# Patient Record
Sex: Male | Born: 1950 | Race: Black or African American | Hispanic: No | Smoking: Former smoker
Health system: Southern US, Community
[De-identification: ages and names within clinical notes are randomized; demographics above are authoritative.]

## PROBLEM LIST (undated history)

## (undated) DIAGNOSIS — I519 Heart disease, unspecified: Secondary | ICD-10-CM

## (undated) DIAGNOSIS — N189 Chronic kidney disease, unspecified: Secondary | ICD-10-CM

## (undated) DIAGNOSIS — Z6372 Alcoholism and drug addiction in family: Secondary | ICD-10-CM

## (undated) DIAGNOSIS — I2510 Atherosclerotic heart disease of native coronary artery without angina pectoris: Secondary | ICD-10-CM

## (undated) DIAGNOSIS — I428 Other cardiomyopathies: Secondary | ICD-10-CM

## (undated) DIAGNOSIS — R569 Unspecified convulsions: Secondary | ICD-10-CM

## (undated) DIAGNOSIS — J449 Chronic obstructive pulmonary disease, unspecified: Secondary | ICD-10-CM

## (undated) DIAGNOSIS — I5022 Chronic systolic (congestive) heart failure: Secondary | ICD-10-CM

## (undated) DIAGNOSIS — N183 Chronic kidney disease, stage 3 unspecified: Secondary | ICD-10-CM

## (undated) DIAGNOSIS — M1990 Unspecified osteoarthritis, unspecified site: Secondary | ICD-10-CM

## (undated) DIAGNOSIS — R7611 Nonspecific reaction to tuberculin skin test without active tuberculosis: Secondary | ICD-10-CM

## (undated) DIAGNOSIS — I499 Cardiac arrhythmia, unspecified: Secondary | ICD-10-CM

## (undated) DIAGNOSIS — I509 Heart failure, unspecified: Secondary | ICD-10-CM

## (undated) DIAGNOSIS — I10 Essential (primary) hypertension: Secondary | ICD-10-CM

## (undated) HISTORY — PX: CARDIAC CATHETERIZATION: SHX172

## (undated) HISTORY — DX: Other cardiomyopathies: I42.8

## (undated) HISTORY — DX: Chronic kidney disease, unspecified: N18.9

## (undated) HISTORY — DX: Heart disease, unspecified: I51.9

## (undated) HISTORY — DX: Unspecified convulsions: R56.9

## (undated) HISTORY — PX: TONSILLECTOMY: SUR1361

## (undated) HISTORY — DX: Alcoholism and drug addiction in family: Z63.72

## (undated) HISTORY — DX: Chronic obstructive pulmonary disease, unspecified: J44.9

## (undated) HISTORY — DX: Unspecified osteoarthritis, unspecified site: M19.90

## (undated) HISTORY — DX: Chronic systolic (congestive) heart failure: I50.22

## (undated) HISTORY — DX: Nonspecific reaction to tuberculin skin test without active tuberculosis: R76.11

## (undated) HISTORY — DX: Chronic kidney disease, stage 3 unspecified: N18.30

---

## 2006-07-21 ENCOUNTER — Ambulatory Visit: Payer: Self-pay | Admitting: Gastroenterology

## 2008-06-20 ENCOUNTER — Emergency Department: Payer: Self-pay | Admitting: Emergency Medicine

## 2008-07-24 ENCOUNTER — Emergency Department: Payer: Self-pay | Admitting: Emergency Medicine

## 2008-09-05 ENCOUNTER — Ambulatory Visit: Payer: Self-pay | Admitting: Internal Medicine

## 2017-01-02 ENCOUNTER — Other Ambulatory Visit: Payer: Self-pay

## 2017-01-02 ENCOUNTER — Emergency Department: Payer: Medicaid - Out of State

## 2017-01-02 ENCOUNTER — Inpatient Hospital Stay
Admission: EM | Admit: 2017-01-02 | Discharge: 2017-01-05 | DRG: 291 | Disposition: A | Discharge status: Home Health | Payer: Medicaid - Out of State | Attending: Internal Medicine | Admitting: Internal Medicine

## 2017-01-02 DIAGNOSIS — N183 Chronic kidney disease, stage 3 (moderate): Secondary | ICD-10-CM | POA: Diagnosis present

## 2017-01-02 DIAGNOSIS — I509 Heart failure, unspecified: Secondary | ICD-10-CM

## 2017-01-02 DIAGNOSIS — I130 Hypertensive heart and chronic kidney disease with heart failure and stage 1 through stage 4 chronic kidney disease, or unspecified chronic kidney disease: Secondary | ICD-10-CM | POA: Diagnosis present

## 2017-01-02 DIAGNOSIS — Z9119 Patient's noncompliance with other medical treatment and regimen: Secondary | ICD-10-CM | POA: Diagnosis not present

## 2017-01-02 DIAGNOSIS — I5021 Acute systolic (congestive) heart failure: Secondary | ICD-10-CM | POA: Diagnosis present

## 2017-01-02 DIAGNOSIS — R9431 Abnormal electrocardiogram [ECG] [EKG]: Secondary | ICD-10-CM

## 2017-01-02 DIAGNOSIS — I248 Other forms of acute ischemic heart disease: Secondary | ICD-10-CM | POA: Diagnosis present

## 2017-01-02 DIAGNOSIS — I2510 Atherosclerotic heart disease of native coronary artery without angina pectoris: Secondary | ICD-10-CM | POA: Diagnosis present

## 2017-01-02 DIAGNOSIS — Z880 Allergy status to penicillin: Secondary | ICD-10-CM

## 2017-01-02 DIAGNOSIS — N179 Acute kidney failure, unspecified: Secondary | ICD-10-CM | POA: Diagnosis present

## 2017-01-02 DIAGNOSIS — Z8679 Personal history of other diseases of the circulatory system: Secondary | ICD-10-CM

## 2017-01-02 DIAGNOSIS — Z9114 Patient's other noncompliance with medication regimen: Secondary | ICD-10-CM

## 2017-01-02 DIAGNOSIS — F17210 Nicotine dependence, cigarettes, uncomplicated: Secondary | ICD-10-CM | POA: Diagnosis present

## 2017-01-02 HISTORY — DX: Essential (primary) hypertension: I10

## 2017-01-02 HISTORY — DX: Cardiac arrhythmia, unspecified: I49.9

## 2017-01-02 HISTORY — DX: Atherosclerotic heart disease of native coronary artery without angina pectoris: I25.10

## 2017-01-02 LAB — CBC
HEMATOCRIT: 45.3 % (ref 40.0–52.0)
HEMOGLOBIN: 15.1 g/dL (ref 13.0–18.0)
MCH: 30.1 pg (ref 26.0–34.0)
MCHC: 33.3 g/dL (ref 32.0–36.0)
MCV: 90.5 fL (ref 80.0–100.0)
Platelets: 135 10*3/uL — ABNORMAL LOW (ref 150–440)
RBC: 5 MIL/uL (ref 4.40–5.90)
RDW: 14.1 % (ref 11.5–14.5)
WBC: 5.4 10*3/uL (ref 3.8–10.6)

## 2017-01-02 LAB — BASIC METABOLIC PANEL
ANION GAP: 8 (ref 5–15)
BUN: 21 mg/dL — ABNORMAL HIGH (ref 6–20)
CALCIUM: 9.1 mg/dL (ref 8.9–10.3)
CHLORIDE: 103 mmol/L (ref 101–111)
CO2: 25 mmol/L (ref 22–32)
Creatinine, Ser: 1.61 mg/dL — ABNORMAL HIGH (ref 0.61–1.24)
GFR calc non Af Amer: 44 mL/min — ABNORMAL LOW (ref >60)
GFR, EST AFRICAN AMERICAN: 51 mL/min — AB (ref >60)
GLUCOSE: 104 mg/dL — AB (ref 65–99)
POTASSIUM: 4.4 mmol/L (ref 3.5–5.1)
Sodium: 136 mmol/L (ref 135–145)

## 2017-01-02 LAB — URINE DRUG SCREEN, QUALITATIVE (ARMC ONLY)
Amphetamines, Ur Screen: NOT DETECTED
Barbiturates, Ur Screen: NOT DETECTED
Benzodiazepine, Ur Scrn: NOT DETECTED
CANNABINOID 50 NG, UR ~~LOC~~: NOT DETECTED
COCAINE METABOLITE, UR ~~LOC~~: POSITIVE — AB
MDMA (ECSTASY) UR SCREEN: NOT DETECTED
Methadone Scn, Ur: NOT DETECTED
Opiate, Ur Screen: NOT DETECTED
PHENCYCLIDINE (PCP) UR S: NOT DETECTED
Tricyclic, Ur Screen: NOT DETECTED

## 2017-01-02 LAB — CREATININE, SERUM
Creatinine, Ser: 1.75 mg/dL — ABNORMAL HIGH (ref 0.61–1.24)
GFR calc Af Amer: 46 mL/min — ABNORMAL LOW (ref >60)
GFR, EST NON AFRICAN AMERICAN: 39 mL/min — AB (ref >60)

## 2017-01-02 LAB — BRAIN NATRIURETIC PEPTIDE: B Natriuretic Peptide: 1857 pg/mL — ABNORMAL HIGH (ref 0.0–100.0)

## 2017-01-02 LAB — TROPONIN I
TROPONIN I: 0.03 ng/mL — AB (ref <0.03)
Troponin I: 0.03 ng/mL (ref <0.03)

## 2017-01-02 MED ORDER — FUROSEMIDE 10 MG/ML IJ SOLN
40.0000 mg | Freq: Once | INTRAMUSCULAR | Status: AC
  Administered 2017-01-02: 40 mg via INTRAVENOUS
  Filled 2017-01-02: qty 4

## 2017-01-02 MED ORDER — ASPIRIN 81 MG PO CHEW
324.0000 mg | CHEWABLE_TABLET | Freq: Once | ORAL | Status: AC
  Administered 2017-01-02: 324 mg via ORAL
  Filled 2017-01-02: qty 4

## 2017-01-02 MED ORDER — ACETAMINOPHEN 325 MG PO TABS: 650.0000 mg | ORAL_TABLET | Freq: Four times a day (QID) | ORAL | Status: DC | PRN

## 2017-01-02 MED ORDER — SODIUM CHLORIDE 0.9 % IV SOLN: 250.0000 mL | INTRAVENOUS | Status: DC | PRN

## 2017-01-02 MED ORDER — LABETALOL HCL 5 MG/ML IV SOLN
10.0000 mg | INTRAVENOUS | Status: DC | PRN
  Administered 2017-01-02 – 2017-01-03 (×2): 10 mg via INTRAVENOUS
  Filled 2017-01-02 (×2): qty 4

## 2017-01-02 MED ORDER — ACETAMINOPHEN 650 MG RE SUPP
650.0000 mg | Freq: Four times a day (QID) | RECTAL | Status: DC | PRN
Start: 2017-01-02 — End: 2017-01-05

## 2017-01-02 MED ORDER — ENOXAPARIN SODIUM 40 MG/0.4ML ~~LOC~~ SOLN
40.0000 mg | SUBCUTANEOUS | Status: DC
  Administered 2017-01-02: 40 mg via SUBCUTANEOUS
  Filled 2017-01-02: qty 0.4

## 2017-01-02 MED ORDER — HYDRALAZINE HCL 20 MG/ML IJ SOLN
10.0000 mg | Freq: Four times a day (QID) | INTRAMUSCULAR | Status: DC | PRN
  Administered 2017-01-03: 10 mg via INTRAVENOUS
  Filled 2017-01-02: qty 1

## 2017-01-02 MED ORDER — NITROGLYCERIN 2 % TD OINT
0.5000 [in_us] | TOPICAL_OINTMENT | Freq: Four times a day (QID) | TRANSDERMAL | Status: DC
  Administered 2017-01-02 – 2017-01-04 (×6): 0.5 [in_us] via TOPICAL
  Filled 2017-01-02 (×7): qty 1

## 2017-01-02 MED ORDER — ASPIRIN 325 MG PO TABS
325.0000 mg | ORAL_TABLET | Freq: Every day | ORAL | Status: DC
  Administered 2017-01-03 – 2017-01-05 (×3): 325 mg via ORAL
  Filled 2017-01-02 (×4): qty 1

## 2017-01-02 MED ORDER — IOPAMIDOL (ISOVUE-370) INJECTION 76%
75.0000 mL | Freq: Once | INTRAVENOUS | Status: AC | PRN
  Administered 2017-01-02: 75 mL via INTRAVENOUS

## 2017-01-02 MED ORDER — FUROSEMIDE 10 MG/ML IJ SOLN
20.0000 mg | Freq: Two times a day (BID) | INTRAMUSCULAR | Status: DC
  Administered 2017-01-03 – 2017-01-04 (×3): 20 mg via INTRAVENOUS
  Filled 2017-01-02: qty 4
  Filled 2017-01-02 (×3): qty 2

## 2017-01-02 NOTE — ED Triage Notes (Signed)
Pt c/o SOB and CP that started together approx 3 days ago. Pt states that he feels squeezing and heaviness in central chest area. White productive sputum. Unable to lie down to sleep. No hx of CHF.

## 2017-01-02 NOTE — H&P (Signed)
[facility] [place] NAME: Jonathon Rios    MR#:  [id number]  DATE OF BIRTH:  1950/08/12  DATE OF ADMISSION:  01/02/2017  PRIMARY CARE PHYSICIAN: Patient, No Pcp Per   REQUESTING/REFERRING PHYSICIAN: Rudene Re MD  CHIEF COMPLAINT:   Chief Complaint  Patient presents with  . Chest Pain  . Shortness of Breath    HISTORY OF PRESENT ILLNESS: Jonathon Rios  is a 66 y.o. male with a known history of a artery disease, essential hypertension and congestive heart failure who is not on any medications.  He reports that he was evaluated last in [place] at that time he told was told that his heart was weak but is not taking any medications currently.  Who is presenting with chest pain and shortness of breath.  Patient's BNP is significantly elevated his blood pressure is high as well.  He states that he feels like pressure on his chest.  He does not recall the results of his cardiac catheterization that he had one year ago.  Patient states that he stopped taking all medications in January.  Denies any significant lower extremity swelling.        PAST MEDICAL HISTORY:   Past Medical History:  Diagnosis Date  . Coronary artery disease   . Hypertension   . Irregular heart beat     PAST SURGICAL HISTORY: History reviewed. No pertinent surgical history.  SOCIAL HISTORY:  Social History   Tobacco Use  . Smoking status: Not on file  Substance Use Topics  . Alcohol use: No    Frequency: Never    FAMILY HISTORY:  Positive for hypertension    DRUG ALLERGIES:  Allergies  Allergen Reactions  . Penicillins Hives    Has patient had a PCN reaction causing immediate rash, facial/tongue/throat swelling, SOB or lightheadedness with hypotension: Yes Has patient had a PCN reaction causing severe rash involving mucus membranes or skin necrosis: No Has patient had a PCN reaction that required hospitalization: No Has patient had a PCN reaction  occurring within the last 10 years: No If all of the above answers are "NO", then may proceed with Cephalosporin use.    REVIEW OF SYSTEMS:   CONSTITUTIONAL: No fever, positive fatigue or weakness.  EYES: No blurred or double vision.  EARS, NOSE, AND THROAT: No tinnitus or ear pain.  RESPIRATORY: No cough, shortness of breath, wheezing or hemoptysis.  CARDIOVASCULAR: No chest pain, orthopnea, edema.  GASTROINTESTINAL: No nausea, vomiting, diarrhea or abdominal pain.  GENITOURINARY: No dysuria, hematuria.  ENDOCRINE: No polyuria, nocturia,  HEMATOLOGY: No anemia, easy bruising or bleeding SKIN: No rash or lesion. MUSCULOSKELETAL: No joint pain or arthritis.   NEUROLOGIC: No tingling, numbness, weakness.  PSYCHIATRY: No anxiety or depression.   MEDICATIONS AT HOME:  Prior to Admission medications   Not on File      PHYSICAL EXAMINATION:   VITAL SIGNS: Blood pressure (!) 158/109, pulse 88, temperature 98 F (36.7 C), temperature source Oral, resp. rate (!) 28, height 6\' 4"  (1.93 m), weight 180 lb (81.6 kg), SpO2 100 %.  GENERAL:  66 y.o.-year-old patient lying in the bed with no acute distress.  EYES: Pupils equal, round, reactive to light and accommodation. No scleral icterus. Extraocular muscles intact.  HEENT: Head atraumatic, normocephalic. Oropharynx and nasopharynx clear.  NECK:  Supple, no jugular venous distention. No thyroid enlargement, no tenderness.  LUNGS: Bilateral crackles at the bases no accessory muscle usage CARDIOVASCULAR: S1, S2 normal. No  murmurs, rubs, or gallops.  ABDOMEN: Soft, nontender, nondistended. Bowel sounds present. No organomegaly or mass.  EXTREMITIES: No pedal edema, cyanosis, or clubbing.  NEUROLOGIC: Cranial nerves II through XII are intact. Muscle strength 5/5 in all extremities. Sensation intact. Gait not checked.  PSYCHIATRIC: The patient is alert and oriented x 3.  SKIN: No obvious rash, lesion, or ulcer.   LABORATORY PANEL:    CBC Recent Labs  Lab 01/02/17 1306  WBC 5.4  HGB 15.1  HCT 45.3  PLT 135*  MCV 90.5  MCH 30.1  MCHC 33.3  RDW 14.1   ------------------------------------------------------------------------------------------------------------------  Chemistries  Recent Labs  Lab 01/02/17 1306  NA 136  K 4.4  CL 103  CO2 25  GLUCOSE 104*  BUN 21*  CREATININE 1.61*  CALCIUM 9.1   ------------------------------------------------------------------------------------------------------------------ estimated creatinine clearance is 53.5 mL/min (A) (by C-G formula based on SCr of 1.61 mg/dL (H)). ------------------------------------------------------------------------------------------------------------------ No results for input(s): TSH, T4TOTAL, T3FREE, THYROIDAB in the last 72 hours.  Invalid input(s): FREET3   Coagulation profile No results for input(s): INR, PROTIME in the last 168 hours. ------------------------------------------------------------------------------------------------------------------- No results for input(s): DDIMER in the last 72 hours. -------------------------------------------------------------------------------------------------------------------  Cardiac Enzymes Recent Labs  Lab 01/02/17 1306  TROPONINI 0.03*   ------------------------------------------------------------------------------------------------------------------ Invalid input(s): POCBNP  ---------------------------------------------------------------------------------------------------------------  Urinalysis No results found for: COLORURINE, APPEARANCEUR, LABSPEC, PHURINE, GLUCOSEU, HGBUR, BILIRUBINUR, KETONESUR, PROTEINUR, UROBILINOGEN, NITRITE, LEUKOCYTESUR   RADIOLOGY: Ct Angio Chest Pe W And/or Wo Contrast  Result Date: 01/02/2017 CLINICAL DATA:  Shortness of breath and chest pain EXAM: CT ANGIOGRAPHY CHEST WITH CONTRAST TECHNIQUE: Multidetector CT imaging of the chest was performed  using the standard protocol during bolus administration of intravenous contrast. Multiplanar CT image reconstructions and MIPs were obtained to evaluate the vascular anatomy. CONTRAST:  75 mL Isovue 370 nonionic COMPARISON:  Chest radiograph January 02, 2017 FINDINGS: Cardiovascular: There is no demonstrable pulmonary embolus. The ascending thoracic aorta has a transverse diameter of 4.2 x 4.1 cm. There is no evident dissection. Note that the contrast bolus is less than optimal for assessment for potential dissection. Visualized great vessels appear unremarkable. There is a degree of cardiomegaly with left ventricular hypertrophy. There is a minimal pericardial effusion. There is slight calcification in the thoracic aorta. Mediastinum/Nodes: Visualized thyroid appears unremarkable. There are subcentimeter mediastinal lymph nodes which do not meet size criteria for pathologic significance. There is no frank adenopathy by size criteria evident on this study. No esophageal lesions are appreciable. Lungs/Pleura: There is underlying centrilobular emphysematous change. On axial slice 14 series 6, there is a nodular opacity in the apical segment of the right upper lobe measuring 7 x 5 mm. On axial slice 45 series 6, there is a nodular opacity in the posterior segment of the right upper lobe measuring 6 x 5 mm. There is a fairly small but present free-flowing pleural effusion on the right. There is atelectatic change in the posterior right base adjacent to the pleural effusion. There is also patchy atelectasis in both lower lobes. No well-defined airspace consolidation. Upper Abdomen: In the visualized upper abdomen, there is reflux of contrast into the at inferior vena cava and hepatic veins. Visualized upper abdominal structures otherwise appear unremarkable. Musculoskeletal: There is degenerative change in the thoracic spine. No blastic or lytic bone lesions. Review of the MIP images confirms the above findings.  IMPRESSION: 1.  No demonstrable pulmonary embolus. 2. Ascending thoracic aorta measures 4.2 x 4.1 cm. No dissection evident. Note that the contrast bolus in the aorta is  not optimal for assessment for potential dissection. 3.  Mild aortic atherosclerosis. 4. Cardiomegaly with left ventricular hypertrophy. The minimal pericardial effusion. 5. Fairly small but present pleural effusion on the right with right base atelectasis adjacent to the fusion. Patchy bibasilar atelectasis elsewhere. No edema or consolidation. There is a degree of underlying centrilobular emphysema. 6. Pulmonary nodular opacities, largest measuring 6 mm in the apical segment right upper lobe. Non-contrast chest CT at 6-12 months is recommended. If the nodule is stable at time of repeat CT, then future CT at 18-24 months (from today's scan) is considered optional for low-risk patients, but is recommended for high-risk patients. This recommendation follows the consensus statement: Guidelines for Management of Incidental Pulmonary Nodules Detected on CT Images: From the Fleischner Society 2017; Radiology 2017; 284:228-243. 7.  No evident thoracic adenopathy. 8. Reflux of contrast into the inferior vena cava and hepatic veins may indicate a degree of increased right heart pressure. Aortic Atherosclerosis (ICD10-I70.0) and Emphysema (ICD10-J43.9). Electronically Signed   By: Lowella Grip III M.D.   On: 01/02/2017 14:45   Dg Chest Portable 1 View  Result Date: 01/02/2017 CLINICAL DATA:  Chest pain for 3 days. EXAM: PORTABLE CHEST 1 VIEW COMPARISON:  None. FINDINGS: Cardiomegaly with mild bibasilar interstitial prominence but no overt pulmonary edema. No focal airspace consolidation. Blunting of the right costophrenic angle might be a small pleural effusion. The left costophrenic angle is normal. No pneumothorax. IMPRESSION: Cardiomegaly without overt pulmonary edema. Electronically Signed   By: Ulyses Jarred M.D.   On: 01/02/2017 13:36     EKG: Orders placed or performed during the hospital encounter of 01/02/17  . ED EKG within 10 minutes  . EKG 12-Lead  . EKG 12-Lead  . ED EKG within 10 minutes    IMPRESSION AND PLAN: Patient is a 66 year old with history of congestive heart failure possible coronary artery disease presenting with shortness of breath  1.  Acute CHF type unknown I will treat with IV Lasix Obtain echocardiogram of the heart Cardiology consult  2.  Chest pressure Start patient on aspirin Cardiac enzymes Cardiology consult Start nitroglycerin  3.  Accelerated hypertension I will start patient on Nitropatch IV Lasix, as needed IV hydralazine Check urine drug screen  4.  Nicotine abuse smoking cessation provided 4 minutes spent with patient recommended to stop smoking Nicotine patch       All the records are reviewed and case discussed with ED provider. Management plans discussed with the patient, family and they are in agreement.  CODE STATUS: Code Status History    This patient does not have a recorded code status. Please follow your organizational policy for patients in this situation.       TOTAL TIME TAKING CARE OF THIS PATIENT: 55 minutes.    Dustin Flock M.D on 01/02/2017 at 3:52 PM  Between 7am to 6pm - Pager - [phone]  After 6pm go to [email or web address] - password [email or web address] [facility]  Office  [phone]  CC: Primary care physician; Patient, No Pcp Per

## 2017-01-02 NOTE — ED Notes (Signed)
Pt transported to CT ?

## 2017-01-02 NOTE — ED Notes (Signed)
Pt given food tray and cranberry juice; given warm blanket and pillow for comfort.

## 2017-01-02 NOTE — ED Notes (Signed)
Spoke with Dr. Jannifer Franklin regarding elevated diastolic BP >346. See new orders

## 2017-01-02 NOTE — ED Provider Notes (Signed)
[facility] Emergency Department Provider Note  ____________________________________________  Time seen: Approximately 1:27 PM  I have reviewed the triage vital signs and the nursing notes.   HISTORY  Chief Complaint Chest Pain and Shortness of Breath   HPI Jonathon Rios is a 66 y.o. male with h/o CAD, HTN, and irregular heart rhythm who presents for evaluation of SOB and chest heaviness. Patient reports that his symptoms have been constant and getting progressively worse for the last 4 days. He has been coughing clear sputum, has severe shortness of breath that is worse with minimal exertion or laying flat. Has been propping himself up into pillows. No leg swelling. No history of CHF. Patient reports that he is supposed to be on antihypertensive medication however hasn't taken that since January. He recently moved here from [place]. According to the patient has never had a heart attack. He is a smoker but denies any history of COPD. He denies fever or chills. He is also complaining of tightness that is mild and located in the center of his chest and constant for the last 4 days.  Past Medical History:  Diagnosis Date  . Coronary artery disease   . Hypertension   . Irregular heart beat     There are no active problems to display for this patient.   History reviewed. No pertinent surgical history.  Prior to Admission medications   Not on File    Allergies Penicillins  No family history on file.  Social History Social History   Tobacco Use  . Smoking status: Not on file  Substance Use Topics  . Alcohol use: No    Frequency: Never  . Drug use: Not on file    Review of Systems  Constitutional: Negative for fever. Eyes: Negative for visual changes. ENT: Negative for sore throat. Neck: No neck pain  Cardiovascular: + chest pain. Respiratory: + shortness of breath. Gastrointestinal: Negative for abdominal pain, vomiting or  diarrhea. Genitourinary: Negative for dysuria. Musculoskeletal: Negative for back pain. Skin: Negative for rash. Neurological: Negative for headaches, weakness or numbness. Psych: No SI or HI  ____________________________________________   PHYSICAL EXAM:  VITAL SIGNS: ED Triage Vitals  Enc Vitals Group     BP 01/02/17 1306 (!) 158/109     Pulse Rate 01/02/17 1306 88     Resp 01/02/17 1306 (!) 28     Temp 01/02/17 1306 98 F (36.7 C)     Temp Source 01/02/17 1306 Oral     SpO2 01/02/17 1306 100 %     Weight 01/02/17 1307 180 lb (81.6 kg)     Height 01/02/17 1307 6\' 4"  (1.93 m)     Head Circumference --      Peak Flow --      Pain Score 01/02/17 1306 3     Pain Loc --      Pain Edu? --      Excl. in [place]? --     Constitutional: Alert and oriented, mild respiratory distress.  HEENT:      Head: Normocephalic and atraumatic.         Eyes: Conjunctivae are normal. Sclera is non-icteric.       Mouth/Throat: Mucous membranes are moist.       Neck: Supple with no signs of meningismus. Cardiovascular: Regular rate and rhythm. No murmurs, gallops, or rubs. 2+ symmetrical distal pulses are present in all extremities. No JVD. Respiratory: Tachypnea with normal sats and clear lungs with good air movement.  Gastrointestinal: Soft, non tender, and non distended with positive bowel sounds. No rebound or guarding. Musculoskeletal: Nontender with normal range of motion in all extremities. No edema, cyanosis, or erythema of extremities. Neurologic: Normal speech and language. Face is symmetric. Moving all extremities. No gross focal neurologic deficits are appreciated. Skin: Skin is warm, dry and intact. No rash noted. Psychiatric: Mood and affect are normal. Speech and behavior are normal.  ____________________________________________   LABS (all labs ordered are listed, but only abnormal results are displayed)  Labs Reviewed  BASIC METABOLIC PANEL - Abnormal; Notable for the following  components:      Result Value   Glucose, Bld 104 (*)    BUN 21 (*)    Creatinine, Ser 1.61 (*)    GFR calc non Af Amer 44 (*)    GFR calc Af Amer 51 (*)    All other components within normal limits  CBC - Abnormal; Notable for the following components:   Platelets 135 (*)    All other components within normal limits  TROPONIN I - Abnormal; Notable for the following components:   Troponin I 0.03 (*)    All other components within normal limits  BRAIN NATRIURETIC PEPTIDE - Abnormal; Notable for the following components:   B Natriuretic Peptide 1,857.0 (*)    All other components within normal limits   ____________________________________________  EKG  ED ECG REPORT I, Rudene Re, the attending physician, personally viewed and interpreted this ECG.  Normal sinus rhythm, rate of 88, normal intervals, LVH, normal axis, T-wave inversions in inferior and lateral leads. No prior for comparison.  ____________________________________________  RADIOLOGY  CXR: Cardiomegaly without overt pulmonary edema     CTA chest: 1. No demonstrable pulmonary embolus.  2. Ascending thoracic aorta measures 4.2 x 4.1 cm. No dissection evident. Note that the contrast bolus in the aorta is not optimal for assessment for potential dissection.  3. Mild aortic atherosclerosis.  4. Cardiomegaly with left ventricular hypertrophy. The minimal pericardial effusion.  5. Fairly small but present pleural effusion on the right with right base atelectasis adjacent to the fusion. Patchy bibasilar atelectasis elsewhere. No edema or consolidation. There is a degree of underlying centrilobular emphysema.  6. Pulmonary nodular opacities, largest measuring 6 mm in the apical segment right upper lobe. Non-contrast chest CT at 6-12 months is recommended. If the nodule is stable at time of repeat CT, then future CT at 18-24 months (from today's scan) is considered optional for low-risk patients, but is  recommended for high-risk patients. This recommendation follows the consensus statement: Guidelines for Management of Incidental Pulmonary Nodules Detected on CT Images: From the Fleischner Society 2017; Radiology 2017; 284:228-243.  7. No evident thoracic adenopathy.  8. Reflux of contrast into the inferior vena cava and hepatic veins may indicate a degree of increased right heart pressure. ____________________________________________   PROCEDURES  Procedure(s) performed: None Procedures Critical Care performed: yes  CRITICAL CARE Performed by: Rudene Re  ?  Total critical care time: 35 min  Critical care time was exclusive of separately billable procedures and treating other patients.  Critical care was necessary to treat or prevent imminent or life-threatening deterioration.  Critical care was time spent personally by me on the following activities: development of treatment plan with patient and/or surrogate as well as nursing, discussions with consultants, evaluation of patient's response to treatment, examination of patient, obtaining history from patient or surrogate, ordering and performing treatments and interventions, ordering and review of laboratory studies, ordering and review  of radiographic studies, pulse oximetry and re-evaluation of patient's condition.  ____________________________________________   INITIAL IMPRESSION / ASSESSMENT AND PLAN / ED COURSE   66 y.o. male with h/o CAD, HTN, and irregular heart rhythm who presents for evaluation of SOB and chest heaviness x 4 days. Patient is dyspneic with a normal sats and clear lungs, he is hypertensive and has not been taking his medications for 10 months. His EKG shows deep T-wave inversions in lateral leads with no prior for comparison. I discussed with Dr. Clayborn Bigness, cardiologist on call who thinks this is chronic and does not meet STEMI criteria at this time. Patient looks euvolemic on exam. Differential  diagnoses including ACS versus hypertensive emergency versus pulmonary embolism. We'll get a chest x-ray and basic labs. We'll hold off giving nitroglycerin at this time in case patient has a large PE    _________________________ 3:00 PM on 01/02/2017 -----------------------------------------  Labs concerning for CHF and AKI. Patient given 40mg  of IV lasix. Due to abnormal EKG, AKI, and new CHF patient will be admitted for further management.   As part of my medical decision making, I reviewed the following data within the [place] notes reviewed and incorporated, Labs reviewed , EKG interpreted , Radiograph reviewed , Discussed with admitting physician , Notes from prior ED visits and [place] Controlled Substance Database    Pertinent labs & imaging results that were available during my care of the patient were reviewed by me and considered in my medical decision making (see chart for details).    ____________________________________________   FINAL CLINICAL IMPRESSION(S) / ED DIAGNOSES  Final diagnoses:  Acute on chronic congestive heart failure, unspecified heart failure type (HCC)  AKI (acute kidney injury) (HCC)  Abnormal EKG      NEW MEDICATIONS STARTED DURING THIS VISIT:  This SmartLink is deprecated. Use AVSMEDLIST instead to display the medication list for a patient.   Note:  This document was prepared using Dragon voice recognition software and may include unintentional dictation errors.    Alfred Levins, [place], MD 01/02/17 [phone]

## 2017-01-02 NOTE — ED Notes (Signed)
Attempted to call report

## 2017-01-03 ENCOUNTER — Inpatient Hospital Stay
Admit: 2017-01-03 | Discharge: 2017-01-03 | Disposition: A | Discharge status: Home / Self Care | Payer: Medicaid - Out of State | Attending: Internal Medicine | Admitting: Internal Medicine

## 2017-01-03 ENCOUNTER — Other Ambulatory Visit: Payer: Self-pay

## 2017-01-03 LAB — CBC
HCT: 42.4 % (ref 40.0–52.0)
HEMOGLOBIN: 14 g/dL (ref 13.0–18.0)
MCH: 30 pg (ref 26.0–34.0)
MCHC: 33.1 g/dL (ref 32.0–36.0)
MCV: 90.5 fL (ref 80.0–100.0)
PLATELETS: 120 10*3/uL — AB (ref 150–440)
RBC: 4.68 MIL/uL (ref 4.40–5.90)
RDW: 13.9 % (ref 11.5–14.5)
WBC: 6.9 10*3/uL (ref 3.8–10.6)

## 2017-01-03 LAB — BASIC METABOLIC PANEL
ANION GAP: 5 (ref 5–15)
BUN: 29 mg/dL — AB (ref 6–20)
CO2: 30 mmol/L (ref 22–32)
Calcium: 8.5 mg/dL — ABNORMAL LOW (ref 8.9–10.3)
Chloride: 104 mmol/L (ref 101–111)
Creatinine, Ser: 1.91 mg/dL — ABNORMAL HIGH (ref 0.61–1.24)
GFR, EST AFRICAN AMERICAN: 41 mL/min — AB (ref >60)
GFR, EST NON AFRICAN AMERICAN: 35 mL/min — AB (ref >60)
Glucose, Bld: 98 mg/dL (ref 65–99)
POTASSIUM: 4 mmol/L (ref 3.5–5.1)
SODIUM: 139 mmol/L (ref 135–145)

## 2017-01-03 LAB — TROPONIN I
TROPONIN I: 0.04 ng/mL — AB (ref <0.03)
Troponin I: 0.03 ng/mL (ref <0.03)

## 2017-01-03 MED ORDER — HEPARIN SODIUM (PORCINE) 5000 UNIT/ML IJ SOLN
5000.0000 [IU] | Freq: Three times a day (TID) | INTRAMUSCULAR | Status: DC
  Administered 2017-01-03: 5000 [IU] via SUBCUTANEOUS
  Filled 2017-01-03 (×2): qty 1

## 2017-01-03 MED ORDER — ONDANSETRON HCL 4 MG/2ML IJ SOLN
4.0000 mg | Freq: Four times a day (QID) | INTRAMUSCULAR | Status: DC | PRN
  Administered 2017-01-03: 4 mg via INTRAVENOUS
  Filled 2017-01-03: qty 2

## 2017-01-03 MED ORDER — DIPHENHYDRAMINE HCL 25 MG PO CAPS
25.0000 mg | ORAL_CAPSULE | Freq: Every evening | ORAL | Status: DC | PRN
  Administered 2017-01-03: 25 mg via ORAL
  Filled 2017-01-03: qty 1

## 2017-01-03 MED ORDER — ADULT MULTIVITAMIN W/MINERALS CH
1.0000 | ORAL_TABLET | Freq: Every day | ORAL | Status: DC
  Administered 2017-01-03 – 2017-01-05 (×3): 1 via ORAL
  Filled 2017-01-03 (×3): qty 1

## 2017-01-03 MED ORDER — NITROGLYCERIN 0.4 MG SL SUBL
SUBLINGUAL_TABLET | SUBLINGUAL | Status: AC
  Filled 2017-01-03: qty 3

## 2017-01-03 MED ORDER — ENSURE ENLIVE PO LIQD
237.0000 mL | Freq: Three times a day (TID) | ORAL | Status: DC
  Administered 2017-01-03 – 2017-01-05 (×5): 237 mL via ORAL

## 2017-01-03 MED ORDER — CARVEDILOL 3.125 MG PO TABS
3.1250 mg | ORAL_TABLET | Freq: Two times a day (BID) | ORAL | Status: DC
  Administered 2017-01-03 – 2017-01-05 (×5): 3.125 mg via ORAL
  Filled 2017-01-03 (×5): qty 1

## 2017-01-03 MED ORDER — LISINOPRIL 5 MG PO TABS
5.0000 mg | ORAL_TABLET | Freq: Every day | ORAL | Status: DC
  Administered 2017-01-03 – 2017-01-05 (×3): 5 mg via ORAL
  Filled 2017-01-03 (×3): qty 1

## 2017-01-03 MED ORDER — POLYETHYLENE GLYCOL 3350 17 G PO PACK
17.0000 g | PACK | Freq: Once | ORAL | Status: AC
  Administered 2017-01-03: 17 g via ORAL
  Filled 2017-01-03: qty 1

## 2017-01-03 MED ORDER — SODIUM CHLORIDE 0.9% FLUSH
3.0000 mL | Freq: Two times a day (BID) | INTRAVENOUS | Status: DC
  Administered 2017-01-03 – 2017-01-04 (×4): 3 mL via INTRAVENOUS

## 2017-01-03 MED ORDER — SODIUM CHLORIDE 0.9% FLUSH
3.0000 mL | INTRAVENOUS | Status: DC | PRN
  Administered 2017-01-03 (×3): 3 mL via INTRAVENOUS
  Filled 2017-01-03 (×3): qty 3

## 2017-01-03 MED ORDER — NITROGLYCERIN 0.4 MG SL SUBL
0.4000 mg | SUBLINGUAL_TABLET | SUBLINGUAL | Status: DC | PRN
  Administered 2017-01-03 (×3): 0.4 mg via SUBLINGUAL

## 2017-01-03 MED ORDER — ONDANSETRON HCL 4 MG PO TABS: 4.0000 mg | ORAL_TABLET | Freq: Four times a day (QID) | ORAL | Status: DC | PRN

## 2017-01-03 NOTE — Consult Note (Signed)
Reason for Consult: Shortness of breath congestive heart failure cardiomyopathy Referring Physician: Dr. Fritzi Mandes hospitalist  Jonathon Rios is an 66 y.o. male.  HPI: 66 year old black male recently moved back from [place] has had a history of shortness of breath congestive heart failure cardiomyopathy hypertension smoking significant noncompliance.  Patient got progressively short of breath.  Patient had progressive dyspnea got progressively weak complains of weakness fatigue dyspnea shortness of breath with some chest pain.  Patient states he had a cardiac cath about a year ago in [place] states that he may have had a weak heart but has not maintain medical therapy since.  The patient states that he recently lost his mother and since then he has not really cared much and has not taken care of himself and is gotten depressed.  Past Medical History:  Diagnosis Date  . Coronary artery disease   . Hypertension   . Irregular heart beat     History reviewed. No pertinent surgical history.  History reviewed. No pertinent family history.  Social History:  reports that he has been smoking cigarettes.  He has a 50.00 pack-year smoking history. he has never used smokeless tobacco. He reports that he does not drink alcohol. His drug history is not on file.  Allergies:  Allergies  Allergen Reactions  . Penicillins Hives    Has patient had a PCN reaction causing immediate rash, facial/tongue/throat swelling, SOB or lightheadedness with hypotension: Yes Has patient had a PCN reaction causing severe rash involving mucus membranes or skin necrosis: No Has patient had a PCN reaction that required hospitalization: No Has patient had a PCN reaction occurring within the last 10 years: No If all of the above answers are "NO", then may proceed with Cephalosporin use.    Medications: I have reviewed the patient's current medications.  Results for orders placed or performed during the hospital encounter  of 01/02/17 (from the past 48 hour(s))  Basic metabolic panel     Status: Abnormal   Collection Time: 01/02/17  1:06 PM  Result Value Ref Range   Sodium 136 135 - 145 mmol/L   Potassium 4.4 3.5 - 5.1 mmol/L   Chloride 103 101 - 111 mmol/L   CO2 25 22 - 32 mmol/L   Glucose, Bld 104 (H) 65 - 99 mg/dL   BUN 21 (H) 6 - 20 mg/dL   Creatinine, Ser 1.61 (H) 0.61 - 1.24 mg/dL   Calcium 9.1 8.9 - 10.3 mg/dL   GFR calc non Af Amer 44 (L) >60 mL/min   GFR calc Af Amer 51 (L) >60 mL/min    Comment: (NOTE) The eGFR has been calculated using the CKD EPI equation. This calculation has not been validated in all clinical situations. eGFR's persistently <60 mL/min signify possible Chronic Kidney Disease.    Anion gap 8 5 - 15  CBC     Status: Abnormal   Collection Time: 01/02/17  1:06 PM  Result Value Ref Range   WBC 5.4 3.8 - 10.6 K/uL   RBC 5.00 4.40 - 5.90 MIL/uL   Hemoglobin 15.1 13.0 - 18.0 g/dL   HCT 45.3 40.0 - 52.0 %   MCV 90.5 80.0 - 100.0 fL   MCH 30.1 26.0 - 34.0 pg   MCHC 33.3 32.0 - 36.0 g/dL   RDW 14.1 11.5 - 14.5 %   Platelets 135 (L) 150 - 440 K/uL  Troponin I     Status: Abnormal   Collection Time: 01/02/17  1:06 PM  Result  Value Ref Range   Troponin I 0.03 (HH) <0.03 ng/mL    Comment: CRITICAL RESULT CALLED TO, READ BACK BY AND VERIFIED WITH SHANNON [facility] RN AT 1350 01/02/17. MSS   Brain natriuretic peptide     Status: Abnormal   Collection Time: 01/02/17  1:06 PM  Result Value Ref Range   B Natriuretic Peptide 1,857.0 (H) 0.0 - 100.0 pg/mL  Urine Drug Screen, Qualitative ([facility] only)     Status: Abnormal   Collection Time: 01/02/17  6:05 PM  Result Value Ref Range   Tricyclic, Ur Screen NONE DETECTED NONE DETECTED   Amphetamines, Ur Screen NONE DETECTED NONE DETECTED   MDMA (Ecstasy)Ur Screen NONE DETECTED NONE DETECTED   Cocaine Metabolite,Ur [place] POSITIVE (A) NONE DETECTED   Opiate, Ur Screen NONE DETECTED NONE DETECTED   Phencyclidine (PCP) Ur S NONE DETECTED NONE  DETECTED   Cannabinoid 50 Ng, Ur Wall NONE DETECTED NONE DETECTED   Barbiturates, Ur Screen NONE DETECTED NONE DETECTED   Benzodiazepine, Ur Scrn NONE DETECTED NONE DETECTED   Methadone Scn, Ur NONE DETECTED NONE DETECTED    Comment: (NOTE) 161  Tricyclics, urine               Cutoff 1000 ng/mL 200  Amphetamines, urine             Cutoff 1000 ng/mL 300  MDMA (Ecstasy), urine           Cutoff 500 ng/mL 400  Cocaine Metabolite, urine       Cutoff 300 ng/mL 500  Opiate, urine                   Cutoff 300 ng/mL 600  Phencyclidine (PCP), urine      Cutoff 25 ng/mL 700  Cannabinoid, urine              Cutoff 50 ng/mL 800  Barbiturates, urine             Cutoff 200 ng/mL 900  Benzodiazepine, urine           Cutoff 200 ng/mL 1000 Methadone, urine                Cutoff 300 ng/mL 1100 1200 The urine drug screen provides only a preliminary, unconfirmed 1300 analytical test result and should not be used for non-medical 1400 purposes. Clinical consideration and professional judgment should 1500 be applied to any positive drug screen result due to possible 1600 interfering substances. A more specific alternate chemical method 1700 must be used in order to obtain a confirmed analytical result.  1800 Gas chromato graphy / mass spectrometry (GC/MS) is the preferred 1900 confirmatory method.   Creatinine, serum     Status: Abnormal   Collection Time: 01/02/17  9:19 PM  Result Value Ref Range   Creatinine, Ser 1.75 (H) 0.61 - 1.24 mg/dL   GFR calc non Af Amer 39 (L) >60 mL/min   GFR calc Af Amer 46 (L) >60 mL/min    Comment: (NOTE) The eGFR has been calculated using the CKD EPI equation. This calculation has not been validated in all clinical situations. eGFR's persistently <60 mL/min signify possible Chronic Kidney Disease.   Troponin I     Status: Abnormal   Collection Time: 01/02/17  9:19 PM  Result Value Ref Range   Troponin I 0.03 (HH) <0.03 ng/mL    Comment: CRITICAL VALUE NOTED. VALUE IS  CONSISTENT WITH PREVIOUSLY REPORTED/CALLED VALUE ALV  Troponin I     Status:  Abnormal   Collection Time: 01/03/17  3:02 AM  Result Value Ref Range   Troponin I 0.04 (HH) <0.03 ng/mL    Comment: CRITICAL VALUE NOTED. VALUE IS CONSISTENT WITH PREVIOUSLY REPORTED/CALLED VALUE BY CAF   CBC     Status: Abnormal   Collection Time: 01/03/17  3:02 AM  Result Value Ref Range   WBC 6.9 3.8 - 10.6 K/uL   RBC 4.68 4.40 - 5.90 MIL/uL   Hemoglobin 14.0 13.0 - 18.0 g/dL   HCT 42.4 40.0 - 52.0 %   MCV 90.5 80.0 - 100.0 fL   MCH 30.0 26.0 - 34.0 pg   MCHC 33.1 32.0 - 36.0 g/dL   RDW 13.9 11.5 - 14.5 %   Platelets 120 (L) 150 - 440 K/uL  Basic metabolic panel     Status: Abnormal   Collection Time: 01/03/17  3:02 AM  Result Value Ref Range   Sodium 139 135 - 145 mmol/L   Potassium 4.0 3.5 - 5.1 mmol/L   Chloride 104 101 - 111 mmol/L   CO2 30 22 - 32 mmol/L   Glucose, Bld 98 65 - 99 mg/dL   BUN 29 (H) 6 - 20 mg/dL   Creatinine, Ser 1.91 (H) 0.61 - 1.24 mg/dL   Calcium 8.5 (L) 8.9 - 10.3 mg/dL   GFR calc non Af Amer 35 (L) >60 mL/min   GFR calc Af Amer 41 (L) >60 mL/min    Comment: (NOTE) The eGFR has been calculated using the CKD EPI equation. This calculation has not been validated in all clinical situations. eGFR's persistently <60 mL/min signify possible Chronic Kidney Disease.    Anion gap 5 5 - 15  Troponin I     Status: Abnormal   Collection Time: 01/03/17  8:37 AM  Result Value Ref Range   Troponin I 0.03 (HH) <0.03 ng/mL    Comment: CRITICAL VALUE NOTED. VALUE IS CONSISTENT WITH PREVIOUSLY REPORTED/CALLED VALUE KLW    Ct Angio Chest Pe W And/or Wo Contrast  Result Date: 01/02/2017 CLINICAL DATA:  Shortness of breath and chest pain EXAM: CT ANGIOGRAPHY CHEST WITH CONTRAST TECHNIQUE: Multidetector CT imaging of the chest was performed using the standard protocol during bolus administration of intravenous contrast. Multiplanar CT image reconstructions and MIPs were obtained to  evaluate the vascular anatomy. CONTRAST:  75 mL Isovue 370 nonionic COMPARISON:  Chest radiograph January 02, 2017 FINDINGS: Cardiovascular: There is no demonstrable pulmonary embolus. The ascending thoracic aorta has a transverse diameter of 4.2 x 4.1 cm. There is no evident dissection. Note that the contrast bolus is less than optimal for assessment for potential dissection. Visualized great vessels appear unremarkable. There is a degree of cardiomegaly with left ventricular hypertrophy. There is a minimal pericardial effusion. There is slight calcification in the thoracic aorta. Mediastinum/Nodes: Visualized thyroid appears unremarkable. There are subcentimeter mediastinal lymph nodes which do not meet size criteria for pathologic significance. There is no frank adenopathy by size criteria evident on this study. No esophageal lesions are appreciable. Lungs/Pleura: There is underlying centrilobular emphysematous change. On axial slice 14 series 6, there is a nodular opacity in the apical segment of the right upper lobe measuring 7 x 5 mm. On axial slice 45 series 6, there is a nodular opacity in the posterior segment of the right upper lobe measuring 6 x 5 mm. There is a fairly small but present free-flowing pleural effusion on the right. There is atelectatic change in the posterior right base adjacent to the pleural  effusion. There is also patchy atelectasis in both lower lobes. No well-defined airspace consolidation. Upper Abdomen: In the visualized upper abdomen, there is reflux of contrast into the at inferior vena cava and hepatic veins. Visualized upper abdominal structures otherwise appear unremarkable. Musculoskeletal: There is degenerative change in the thoracic spine. No blastic or lytic bone lesions. Review of the MIP images confirms the above findings. IMPRESSION: 1.  No demonstrable pulmonary embolus. 2. Ascending thoracic aorta measures 4.2 x 4.1 cm. No dissection evident. Note that the contrast  bolus in the aorta is not optimal for assessment for potential dissection. 3.  Mild aortic atherosclerosis. 4. Cardiomegaly with left ventricular hypertrophy. The minimal pericardial effusion. 5. Fairly small but present pleural effusion on the right with right base atelectasis adjacent to the fusion. Patchy bibasilar atelectasis elsewhere. No edema or consolidation. There is a degree of underlying centrilobular emphysema. 6. Pulmonary nodular opacities, largest measuring 6 mm in the apical segment right upper lobe. Non-contrast chest CT at 6-12 months is recommended. If the nodule is stable at time of repeat CT, then future CT at 18-24 months (from today's scan) is considered optional for low-risk patients, but is recommended for high-risk patients. This recommendation follows the consensus statement: Guidelines for Management of Incidental Pulmonary Nodules Detected on CT Images: From the Fleischner Society 2017; Radiology 2017; 284:228-243. 7.  No evident thoracic adenopathy. 8. Reflux of contrast into the inferior vena cava and hepatic veins may indicate a degree of increased right heart pressure. Aortic Atherosclerosis (ICD10-I70.0) and Emphysema (ICD10-J43.9). Electronically Signed   By: Lowella Grip III M.D.   On: 01/02/2017 14:45   Dg Chest Portable 1 View  Result Date: 01/02/2017 CLINICAL DATA:  Chest pain for 3 days. EXAM: PORTABLE CHEST 1 VIEW COMPARISON:  None. FINDINGS: Cardiomegaly with mild bibasilar interstitial prominence but no overt pulmonary edema. No focal airspace consolidation. Blunting of the right costophrenic angle might be a small pleural effusion. The left costophrenic angle is normal. No pneumothorax. IMPRESSION: Cardiomegaly without overt pulmonary edema. Electronically Signed   By: Ulyses Jarred M.D.   On: 01/02/2017 13:36    Review of Systems  Constitutional: Positive for diaphoresis and malaise/fatigue.  HENT: Positive for congestion.   Eyes: Negative.   Respiratory:  Positive for cough, shortness of breath and wheezing.   Cardiovascular: Positive for chest pain, palpitations, orthopnea, leg swelling and PND.  Gastrointestinal: Negative.   Genitourinary: Negative.   Musculoskeletal: Negative.   Skin: Negative.   Neurological: Positive for weakness.   Blood pressure (!) 136/99, pulse 81, temperature 98.2 F (36.8 C), temperature source Oral, resp. rate 18, height 6' 4" (1.93 m), weight 168 lb 9.6 oz (76.5 kg), SpO2 99 %. Physical Exam  Nursing note and vitals reviewed. Constitutional: He is oriented to person, place, and time. He appears well-developed and well-nourished.  HENT:  Head: Normocephalic and atraumatic.  Eyes: Conjunctivae and EOM are normal. Pupils are equal, round, and reactive to light.  Neck: Normal range of motion. Neck supple.  Cardiovascular: Normal rate, regular rhythm and normal heart sounds.  Respiratory: Effort normal and breath sounds normal.  GI: Soft. Bowel sounds are normal.  Musculoskeletal: Normal range of motion.  Neurological: He is alert and oriented to person, place, and time. He has normal reflexes.  Skin: Skin is warm and dry.  Psychiatric: He has a normal mood and affect.    Assessment/Plan: Congestive heart failure Coronary artery disease Hypertension Irreg Heartbeat Cardiomyopathy Smoking Noncompliance . Plan Agree with with ICU  admission Agree with for myocardial infarction Recommend IV diuretic therapy Continue blood pressure control would recommend beta-blocker ACE inhibitor Advised patient to refrain from smoking Recommend echocardiogram for assessment of left ventricular function and valvular disease   Rashunda Passon D Lavontay Kirk 01/03/2017, 3:23 PM

## 2017-01-03 NOTE — Progress Notes (Signed)
Patient presented to ER with c/o SOB and chest pain.  Patient admitted with dx of CHF.  Patient stated he has had a cardiac cath in the PAST approximately one year ago  back in [place] and he will never have another one.  I asked the patient what the results of his cardiac cath revealed and he replied, "I do not know."  Patient has hx of CAD, irregular heart bead  and HTN.  Patient presented to hospital reporting no medications currently.   He reported stopping in January of this year when he lost his mother.  He stated that he really hasn't cared about much since losing his mother.    Patient moved back to [place] in August 2018 to be near his daughter and sister.  Patient does not live with them, nor does he want to do so, as he does not want to interfere with their routines.  Patient currently has a room at a boarding house.  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  He now says he's done with work, as it is just too much.  Echo is pending.  I asked patient what motivates him or would motivate him to take better care of himself.  Patient's response, "I'm not ready to die."    Educational session with patient completed.  ? Provided patient with "Living Better with Heart Failure" packet. Briefly reviewed definition of heart failure and signs and symptoms of an exacerbation. ECHO is pending.    *Reviewed importance of and reason behind checking weight daily in the AM, after using the bathroom, but before getting dressed. Patient informed this RN that he does not weigh himself, as he does not have that kind of scale.  Patient will need scales.   ? Reviewed the following information with patient:   *Discussed when to call the Dr= weight gain of >2lb overnight of 5lb in a week,  *Discussed yellow zone= call MD: weight gain of >2lb overnight of 5lb in a week, increased swelling, increased SOB when lying down, chest  discomfort, dizziness, increased fatigue *Red Zone= call 911: struggle to breath, fainting or near fainting, significant chest pain  ? *Reviewed low sodium diet-provided handout of recommended and not recommended foods. ? *Instructed patient to take medications as prescribed for heart failure. Explained briefly why pt is on the medications (either make you feel better, live longer or keep you out of the hospital) and discussed monitoring and side effects.  ? *Discussed exercise. Patient stated he has always been active - riding bicycle, bowling,etc., but he does not exercise.  Patient plans to go to the track to walk.   ? *Smoking Cessation also discussed.  Patient stated he quit smoking when he came to the hospital   Information provided to patient on the effects of smoking on the body as well as benefits and changes that occur once one quits smoking. "Tips for Quitting" informational sheet provided and reviewed with patient. Information on Apps for Relaxation and Apps for Smoking Cessation as well as information on Quit Smart Classes provided to patient and significant other.   Nicotine Patch currently in use.   ? Role of [facility] discussed. Heart Failure Clinic Appointment scheduled for January 13, 2017 at 11:20 a.m.     Roanna Epley, RN, BSN, [facility]  Cardiovascular & Pulmonary  Nurse Navigator  Direct Line: [phone]  Department Phone #: [phone] Fax: [phone]  Email Address: [email or web address]  ?

## 2017-01-03 NOTE — Progress Notes (Signed)
Complaints of chest pain episode treated with nitroglycerin sl X 3 tabs per standing orders.  Pt reported left sternal chest pain with radiation to left arm.  Rates 7 with decreasing to 4 after tabs.  Nausea with clear emesis produced.  Zofran IV given.  EKG completed without changes noted.  No changes per CCMD on telemetry.

## 2017-01-03 NOTE — Progress Notes (Signed)
[facility] NAME: Jonathon Rios    MR#:  [id number]  DATE OF BIRTH:  1950-08-11  SUBJECTIVE:  Patient irritated since he has not slept for 4 days. Came in with increasing shortness of breath.  Recently moved from [place].  Has not taken his medications in the last 11 months. Denies chest pain  REVIEW OF SYSTEMS:   Review of Systems  Constitutional: Negative for chills, fever and weight loss.  HENT: Negative for ear discharge, ear pain and nosebleeds.   Eyes: Negative for blurred vision, pain and discharge.  Respiratory: Positive for shortness of breath. Negative for sputum production, wheezing and stridor.   Cardiovascular: Negative for chest pain, palpitations, orthopnea and PND.  Gastrointestinal: Negative for abdominal pain, diarrhea, nausea and vomiting.  Genitourinary: Negative for frequency and urgency.  Musculoskeletal: Negative for back pain and joint pain.  Neurological: Positive for weakness. Negative for sensory change, speech change and focal weakness.  Psychiatric/Behavioral: Negative for depression and hallucinations. The patient is not nervous/anxious.    Tolerating Diet:yes Tolerating PT: not needed  DRUG ALLERGIES:   Allergies  Allergen Reactions  . Penicillins Hives    Has patient had a PCN reaction causing immediate rash, facial/tongue/throat swelling, SOB or lightheadedness with hypotension: Yes Has patient had a PCN reaction causing severe rash involving mucus membranes or skin necrosis: No Has patient had a PCN reaction that required hospitalization: No Has patient had a PCN reaction occurring within the last 10 years: No If all of the above answers are "NO", then may proceed with Cephalosporin use.    VITALS:  Blood pressure (!) 136/99, pulse 81, temperature 98.2 F (36.8 C), temperature source Oral, resp. rate 18, height 6\' 4"  (1.93 m), weight 76.5 kg (168 lb 9.6 oz), SpO2 99 %.  PHYSICAL  EXAMINATION:   Physical Exam  GENERAL:  66 y.o.-year-old patient lying in the bed with no acute distress.  EYES: Pupils equal, round, reactive to light and accommodation. No scleral icterus. Extraocular muscles intact.  HEENT: Head atraumatic, normocephalic. Oropharynx and nasopharynx clear.  NECK:  Supple, no jugular venous distention. No thyroid enlargement, no tenderness.  LUNGS: Normal breath sounds bilaterally, no wheezing, rales, rhonchi. No use of accessory muscles of respiration.  CARDIOVASCULAR: S1, S2 normal. No murmurs, rubs, or gallops.  ABDOMEN: Soft, nontender, nondistended. Bowel sounds present. No organomegaly or mass.  EXTREMITIES: No cyanosis, clubbing or edema b/l.    NEUROLOGIC: Cranial nerves II through XII are intact. No focal Motor or sensory deficits b/l.   PSYCHIATRIC:  patient is alert and oriented x 3.  SKIN: No obvious rash, lesion, or ulcer.   LABORATORY PANEL:  CBC Recent Labs  Lab 01/03/17 0302  WBC 6.9  HGB 14.0  HCT 42.4  PLT 120*    Chemistries  Recent Labs  Lab 01/03/17 0302  NA 139  K 4.0  CL 104  CO2 30  GLUCOSE 98  BUN 29*  CREATININE 1.91*  CALCIUM 8.5*   Cardiac Enzymes Recent Labs  Lab 01/03/17 0837  TROPONINI 0.03*   RADIOLOGY:  Ct Angio Chest Pe W And/or Wo Contrast  Result Date: 01/02/2017 CLINICAL DATA:  Shortness of breath and chest pain EXAM: CT ANGIOGRAPHY CHEST WITH CONTRAST TECHNIQUE: Multidetector CT imaging of the chest was performed using the standard protocol during bolus administration of intravenous contrast. Multiplanar CT image reconstructions and MIPs were obtained to evaluate the vascular anatomy. CONTRAST:  75 mL Isovue 370 nonionic COMPARISON:  Chest radiograph January 02, 2017 FINDINGS: Cardiovascular: There is no demonstrable pulmonary embolus. The ascending thoracic aorta has a transverse diameter of 4.2 x 4.1 cm. There is no evident dissection. Note that the contrast bolus is less than optimal for  assessment for potential dissection. Visualized great vessels appear unremarkable. There is a degree of cardiomegaly with left ventricular hypertrophy. There is a minimal pericardial effusion. There is slight calcification in the thoracic aorta. Mediastinum/Nodes: Visualized thyroid appears unremarkable. There are subcentimeter mediastinal lymph nodes which do not meet size criteria for pathologic significance. There is no frank adenopathy by size criteria evident on this study. No esophageal lesions are appreciable. Lungs/Pleura: There is underlying centrilobular emphysematous change. On axial slice 14 series 6, there is a nodular opacity in the apical segment of the right upper lobe measuring 7 x 5 mm. On axial slice 45 series 6, there is a nodular opacity in the posterior segment of the right upper lobe measuring 6 x 5 mm. There is a fairly small but present free-flowing pleural effusion on the right. There is atelectatic change in the posterior right base adjacent to the pleural effusion. There is also patchy atelectasis in both lower lobes. No well-defined airspace consolidation. Upper Abdomen: In the visualized upper abdomen, there is reflux of contrast into the at inferior vena cava and hepatic veins. Visualized upper abdominal structures otherwise appear unremarkable. Musculoskeletal: There is degenerative change in the thoracic spine. No blastic or lytic bone lesions. Review of the MIP images confirms the above findings. IMPRESSION: 1.  No demonstrable pulmonary embolus. 2. Ascending thoracic aorta measures 4.2 x 4.1 cm. No dissection evident. Note that the contrast bolus in the aorta is not optimal for assessment for potential dissection. 3.  Mild aortic atherosclerosis. 4. Cardiomegaly with left ventricular hypertrophy. The minimal pericardial effusion. 5. Fairly small but present pleural effusion on the right with right base atelectasis adjacent to the fusion. Patchy bibasilar atelectasis elsewhere. No  edema or consolidation. There is a degree of underlying centrilobular emphysema. 6. Pulmonary nodular opacities, largest measuring 6 mm in the apical segment right upper lobe. Non-contrast chest CT at 6-12 months is recommended. If the nodule is stable at time of repeat CT, then future CT at 18-24 months (from today's scan) is considered optional for low-risk patients, but is recommended for high-risk patients. This recommendation follows the consensus statement: Guidelines for Management of Incidental Pulmonary Nodules Detected on CT Images: From the Fleischner Society 2017; Radiology 2017; 284:228-243. 7.  No evident thoracic adenopathy. 8. Reflux of contrast into the inferior vena cava and hepatic veins may indicate a degree of increased right heart pressure. Aortic Atherosclerosis (ICD10-I70.0) and Emphysema (ICD10-J43.9). Electronically Signed   By: Lowella Grip III M.D.   On: 01/02/2017 14:45   Dg Chest Portable 1 View  Result Date: 01/02/2017 CLINICAL DATA:  Chest pain for 3 days. EXAM: PORTABLE CHEST 1 VIEW COMPARISON:  None. FINDINGS: Cardiomegaly with mild bibasilar interstitial prominence but no overt pulmonary edema. No focal airspace consolidation. Blunting of the right costophrenic angle might be a small pleural effusion. The left costophrenic angle is normal. No pneumothorax. IMPRESSION: Cardiomegaly without overt pulmonary edema. Electronically Signed   By: Ulyses Jarred M.D.   On: 01/02/2017 13:36   ASSESSMENT AND PLAN:   ORVILE CORONA is a 66 y.o. male with h/o CAD, HTN, and irregular heart rhythm who presents for evaluation of SOB and chest heaviness. Patient reports that his symptoms have been constant and getting progressively worse  for the last 4 days. He has been coughing clear sputum, has severe shortness of breath that is worse with minimal exertion or laying flat. Has been propping himself up into pillows.   1.  Acute CHF type unknown IV Lasix bid -UOP >3ooocc Obtain  echocardiogram of the heart Cardiology consult pending with dr Clayborn Bigness Patient reports having left heart catheterization done in [facility] in [place] about a year ago did not require any intervention.  He does not remember the results. -He tells me his ejection fraction is around 25%  2.  Chest pressure Start patient on aspirin Cardiac enzymes 0.0 3.03.04..demand ischemia in the setting of CHF -Chest pain today -prn nitro  3.  Accelerated hypertension -Started on Coreg and ACE inhibitor -Urine drug screen negative  4.  Nicotine abuse smoking cessation provided 4 minutes spent with patient recommended to stop smoking Nicotine patch  5.  Elevated creatinine patient likely has underlying CKD -I do not have baseline creatinine -We will consider patient following up with nephrology as outpatient  Case discussed with Care Management/Social Worker. Management plans discussed with the patient, family and they are in agreement.  CODE STATUS: full  DVT Prophylaxis: heparin  TOTAL TIME TAKING CARE OF THIS PATIENT: *30* minutes.  >50% time spent on counselling and coordination of care  POSSIBLE D/C IN *1-2* DAYS, DEPENDING ON CLINICAL CONDITION.  Note: This dictation was prepared with Dragon dictation along with smaller phrase technology. Any transcriptional errors that result from this process are unintentional.  Khairi Garman M.D on 01/03/2017 at 2:47 PM  Between 7am to 6pm - Pager - [phone]  After 6pm go to [email or web address] - password [email or web address] [facility]  Office  [phone]  CC: Primary care physician; Patient, No Pcp Per

## 2017-01-03 NOTE — Progress Notes (Signed)
Initial Nutrition Assessment  DOCUMENTATION CODES:   Non-severe (moderate) malnutrition in context of chronic illness  INTERVENTION:   Ensure Enlive po TID, each supplement provides 350 kcal and 20 grams of protein  MVI  NUTRITION DIAGNOSIS:   Moderate Malnutrition related to catabolic illness(CHF) as evidenced by moderate fat depletion, moderate muscle depletion, severe muscle depletion, 7 percent weight loss in 3 months.  GOAL:   Patient will meet greater than or equal to 90% of their needs  MONITOR:   PO intake, Supplement acceptance, Labs, Weight trends, I & O's  REASON FOR ASSESSMENT:   Consult Assessment of nutrition requirement/status, Diet education  ASSESSMENT:    66 y.o. male with h/o CAD, HTN, and irregular heart rhythm who presents for evaluation of SOB and chest heaviness admitted for CHF   Met with pt in room today. Pt reports poor appetite and oral intake for the past week. Pt is currently eating 85-95% of his meals. Pt reports that his appetite is improving slowly. Pt does like strawberry Ensure; RD will order. Pt reports that he weighed 180lbs in August; pt has lost 12lbs(7%) in three months which is significant given history. Pt educated regarding the importance of adequate protein intake and regarding a heart healthy diet today.   Medications reviewed and include: aspirin, lasix, heparin, zofran   Labs reviewed: BUN 29(H), creat 1.91(H), Ca 8.5(L)   Nutrition-Focused physical exam completed. Findings are moderate fat depletions in orbital regions, arms, chest, buccal, moderate muscle depletions in temporal regions and clavicles, severe muscle depletions in BLE and no edema.   Diet Order:  Diet 2 gram sodium Room service appropriate? Yes; Fluid consistency: Thin  EDUCATION NEEDS:   Education needs have been addressed  Skin: Reviewed RN Assessment  Last BM:  11/4  Height:   Ht Readings from Last 1 Encounters:  01/03/17 '6\' 4"'  (1.93 m)     Weight:   Wt Readings from Last 1 Encounters:  01/03/17 168 lb 9.6 oz (76.5 kg)    Ideal Body Weight:  91.8 kg  BMI:  Body mass index is 20.52 kg/m.  Estimated Nutritional Needs:   Kcal:  2150-2450kcal/day   Protein:  115-129g/day   Fluid:  per MD  Koleen Distance MS, RD, LDN Pager #[phone] After Hours Pager: [phone]

## 2017-01-03 NOTE — Discharge Instructions (Signed)
Heart Failure Clinic appointment on January 13 2017 at 11:20am with Darylene Price, [place]. Please call [phone] to reschedule.

## 2017-01-03 NOTE — Progress Notes (Signed)
Pharmacist Anticoagulation Note  66 y/o M admitted with acute CHF on Lovenox for DVT prophylaxis receiving Lasix and SCr increasing.   Filed Weights   01/02/17 1307 01/03/17 0043 01/03/17 0335  Weight: 180 lb (81.6 kg) 168 lb (76.2 kg) 168 lb 9.6 oz (76.5 kg)   Body mass index is 20.52 kg/m.  Estimated Creatinine Clearance: 42.3 mL/min (A) (by C-G formula based on SCr of 1.91 mg/dL (H)).  Will convert patient to [facility] heparin due to increased creatinine and use of diuretics.   Ulice Dash, PharmD Clinical Pharmacist

## 2017-01-03 NOTE — Care Management Note (Addendum)
Case Management Note  Patient Details  Name: Jonathon Rios MRN: [id number] Date of Birth: 25-Aug-1950  Subjective/Objective:                 Admitted from home with congestive heart failure.  Patient does not have PCP and is on no medications.  He does not have insurance. Says he receives 62 dollars a month pension from [place] and up until 3 weeks ago, he was working through a Education officer, environmental.  Said I have medicaid and food stamps but I do not have my cards.  Spoke with his DSS caseworker Karleen Hampshire and informed patient has active medicaid 11-28-2016 thru 11-27-2017 but is not allowed to give the ID number over the phone.  It would have to be mailed to patient.   Action/Plan:   Expected Discharge Date:                  Expected Discharge Plan:     In-House Referral:     Discharge planning Services     Post Acute Care Choice:    Choice offered to:     DME Arranged:    DME Agency:     HH Arranged:    HH Agency:     Status of Service:     If discussed at [employer] of Stay Meetings, dates discussed:    Additional Comments:  Katrina Stack, RN 01/03/2017, 8:42 AM

## 2017-01-03 NOTE — Progress Notes (Signed)
Nutrition Education Note  RD consulted for nutrition education regarding new onset CHF.  RD provided "Low Sodium Nutrition Therapy" handout from the Academy of Nutrition and Dietetics. Reviewed patient's dietary recall. Provided examples on ways to decrease sodium intake in diet. Discouraged intake of processed foods and use of salt shaker. Encouraged fresh fruits and vegetables as well as whole grain sources of carbohydrates to maximize fiber intake.   RD discussed why it is important for patient to adhere to diet recommendations, and emphasized the role of fluids, foods to avoid, and importance of weighing self daily. Teach back method used.  Expect good compliance.  Body mass index is 20.52 kg/m. Pt meets criteria for normal weight based on current BMI.  Current diet order is 2gm sodium, patient is consuming approximately 85-95% of meals at this time.   RD following this pt  Koleen Distance MS, RD, LDN Pager #[phone] After Hours Pager: [phone]

## 2017-01-04 LAB — ECHOCARDIOGRAM COMPLETE
HEIGHTINCHES: 76 in
WEIGHTICAEL: 2697.6 [oz_av]

## 2017-01-04 LAB — HIV ANTIBODY (ROUTINE TESTING W REFLEX): HIV Screen 4th Generation wRfx: NONREACTIVE

## 2017-01-04 MED ORDER — ASPIRIN 81 MG PO TABS: 81.0000 mg | ORAL_TABLET | Freq: Every day | ORAL | 1 refills | Status: DC

## 2017-01-04 MED ORDER — LISINOPRIL 5 MG PO TABS: 5.0000 mg | ORAL_TABLET | Freq: Every day | ORAL | 0 refills | Status: DC

## 2017-01-04 MED ORDER — ADULT MULTIVITAMIN W/MINERALS CH: 1.0000 | ORAL_TABLET | Freq: Every day | ORAL | 0 refills | Status: DC

## 2017-01-04 MED ORDER — ENSURE ENLIVE PO LIQD: 237.0000 mL | Freq: Three times a day (TID) | ORAL | 12 refills | Status: DC

## 2017-01-04 MED ORDER — NITROGLYCERIN 0.4 MG SL SUBL: 0.4000 mg | SUBLINGUAL_TABLET | SUBLINGUAL | 12 refills | Status: DC | PRN

## 2017-01-04 MED ORDER — FUROSEMIDE 20 MG PO TABS: 20.0000 mg | ORAL_TABLET | Freq: Every day | ORAL | 1 refills | Status: DC

## 2017-01-04 MED ORDER — CARVEDILOL 3.125 MG PO TABS: 3.1250 mg | ORAL_TABLET | Freq: Two times a day (BID) | ORAL | 2 refills | Status: DC

## 2017-01-04 MED ORDER — FUROSEMIDE 20 MG PO TABS
20.0000 mg | ORAL_TABLET | Freq: Every day | ORAL | Status: DC
  Administered 2017-01-04 – 2017-01-05 (×2): 20 mg via ORAL
  Filled 2017-01-04 (×2): qty 1

## 2017-01-04 NOTE — Care Management (Signed)
Per [facility], this patient does not have active medicaid as of Dec 29, 2016.  Instructed patient to speak with his DSS caseworker.  CM has provided applications for [facility] and will provide assist with meds at discharge.

## 2017-01-04 NOTE — Plan of Care (Signed)
  Progressing Education: Knowledge of General Education information will improve 01/04/2017 1222 - Progressing by Chriss Czar, Illene Bolus, RN Elimination: Will not experience complications related to bowel motility 01/04/2017 1222 - Progressing by Chriss Czar, Illene Bolus, RN Note Pt had a bowel movement last night after not having one for 2 days.  Will not experience complications related to urinary retention 01/04/2017 1222 - Progressing by Chriss Czar, Illene Bolus, RN Note Pt had good urine output Education: Ability to verbalize understanding of medication therapies will improve 01/04/2017 1222 - Progressing by Feliberto Gottron, RN

## 2017-01-04 NOTE — Care Management (Signed)
Faxed patient's anticipated [facility]s to [facility].  Provided scales

## 2017-01-04 NOTE — Clinical Social Work Note (Signed)
CSW received consult with patient needing assistance with medications, case manager can assist with this.  CSW to sign off, please reconsult if other social work needs arise.  Jones Broom. [place], MSW, [employer]  01/04/2017 3:08 PM

## 2017-01-04 NOTE — Progress Notes (Signed)
Provided patient with "Living Better with Heart Failure" packet. Briefly reviewed definition of heart failure and signs and symptoms of an exacerbation. Reviewed importance of and reason behind checking weight daily in the AM, after using the bathroom, but before getting dressed. Discussed when to call the Dr= weight gain of >2lb overnight of 5lb in a week,  Discussed yellow zone= call MD: weight gain of >2lb overnight of 5lb in a week, increased swelling, increased SOB when lying down, chest discomfort, dizziness, increased fatigue Red Zone= call 911: struggle to breath, fainting or near fainting, significant chest pain Reviewed low sodium diet <2g/day-provided handout of recommended and not recommended foods  Fluid restriction <2L/day Reviewed how to read nutrition label Reviewed medication changes Explained briefly why pt is on the medications (either make you feel better, live longer or keep you out of the hospital) and discussed monitoring and side effects  Discussed tobacco cessation Discussed exercise  Ulice Dash, PharmD Clinical Pharmacist

## 2017-01-04 NOTE — Discharge Summary (Signed)
[place] [facility] [place] NAME: Jonathon Rios    MR#:  [id number]  DATE OF BIRTH:  22-Jul-1950  DATE OF ADMISSION:  01/02/2017 ADMITTING PHYSICIAN: Dustin Flock, MD  DATE OF DISCHARGE: 01/04/2017  PRIMARY CARE PHYSICIAN: Patient, No Pcp Per    ADMISSION DIAGNOSIS:  Abnormal EKG [R94.31] AKI (acute kidney injury) ([place]) [N17.9] Acute on chronic congestive heart failure, unspecified heart failure type ([place]) [I50.9]  DISCHARGE DIAGNOSIS:  Acute systolic CHF Hypertension CAD Medication noncompliance CKD-III SECONDARY DIAGNOSIS:   Past Medical History:  Diagnosis Date  . Coronary artery disease   . Hypertension   . Irregular heart beat     HOSPITAL COURSE:   Jonathon Rios a 66 y.o.malewith h/o CAD, HTN, and irregular heart rhythm who presents for evaluation of SOB and chest heaviness. Patient reports thathis symptoms have been constant and getting progressively worse for the last 4 days. He has been coughing clear sputum, has severe shortness of breath that is worse with minimal exertion or laying flat. Has been propping himself up into pillows.   1.Acute CHF type unknown IV Lasix bid---changed to 20 mg daily Lasix -UOP >5ooocc -Echo showed EF of 25-30% Cardiology consult  with dr Clayborn Bigness initiated Patient reports having left heart catheterization done in [facility] in [place] about a year ago did not require any intervention.  He does not remember the results. -He tells me his ejection fraction is around 25% -Continue lisinopril and Coreg -Continue aspirin  2.Chest pressure Start patient on aspirin Cardiac enzymes 0.0 3.03.04..demand ischemia in the setting of CHF -no Chest pain today -prn nitro  3.Accelerated hypertension -Started on Coreg and ACE inhibitor -Urine drug screen negative  4.Nicotine abuse smoking cessation provided 4 minutes spent with patient recommended to stop  smoking Nicotine patch  5.  Elevated creatinine patient likely has underlying CKD -I do not have baseline creatinine   At baseline.  [facility]  He will pick up his medications from medication management clinic.  He is also set up to follow-up with the congestive heart failure clinic.  D/c home  CONSULTS OBTAINED:  Treatment Team:  Yolonda Kida, MD  DRUG ALLERGIES:   Allergies  Allergen Reactions  . Penicillins Hives    Has patient had a PCN reaction causing immediate rash, facial/tongue/throat swelling, SOB or lightheadedness with hypotension: Yes Has patient had a PCN reaction causing severe rash involving mucus membranes or skin necrosis: No Has patient had a PCN reaction that required hospitalization: No Has patient had a PCN reaction occurring within the last 10 years: No If all of the above answers are "NO", then may proceed with Cephalosporin use.    DISCHARGE MEDICATIONS:   Current Discharge Medication List    START taking these medications   Details  aspirin 81 MG tablet Take 1 tablet (81 mg total) daily by mouth. Qty: 30 tablet, Refills: 1    carvedilol (COREG) 3.125 MG tablet Take 1 tablet (3.125 mg total) 2 (two) times daily with a meal by mouth. Qty: 60 tablet, Refills: 2    feeding supplement, ENSURE ENLIVE, (ENSURE ENLIVE) LIQD Take 237 mLs 3 (three) times daily between meals by mouth. Qty: 237 mL, Refills: 12    furosemide (LASIX) 20 MG tablet Take 1 tablet (20 mg total) daily by mouth. Qty: 30 tablet, Refills: 1    lisinopril (PRINIVIL,ZESTRIL) 5 MG tablet Take 1 tablet (5 mg total) daily by  mouth. Qty: 30 tablet, Refills: 0    Multiple Vitamin (MULTIVITAMIN WITH MINERALS) TABS tablet Take 1 tablet daily by mouth. Qty: 30 tablet, Refills: 0    nitroGLYCERIN (NITROSTAT) 0.4 MG SL tablet Place 1 tablet (0.4 mg total) every 5 (five) minutes as needed under the tongue for chest pain. Qty: 20 tablet,  Refills: 12        If you experience worsening of your admission symptoms, develop shortness of breath, life threatening emergency, suicidal or homicidal thoughts you must seek medical attention immediately by calling 911 or calling your MD immediately  if symptoms less severe.  You Must read complete instructions/literature along with all the possible adverse reactions/side effects for all the Medicines you take and that have been prescribed to you. Take any new Medicines after you have completely understood and accept all the possible adverse reactions/side effects.   Please note  You were cared for by a hospitalist during your hospital stay. If you have any questions about your discharge medications or the care you received while you were in the hospital after you are discharged, you can call the unit and asked to speak with the hospitalist on call if the hospitalist that took care of you is not available. Once you are discharged, your primary care physician will handle any further medical issues. Please note that NO REFILLS for any discharge medications will be authorized once you are discharged, as it is imperative that you return to your primary care physician (or establish a relationship with a primary care physician if you do not have one) for your aftercare needs so that they can reassess your need for medications and monitor your lab values. Today   SUBJECTIVE     VITAL SIGNS:  Blood pressure (!) 113/96, pulse 79, temperature 97.8 F (36.6 C), temperature source Oral, resp. rate 18, height 6\' 4"  (1.93 m), weight 75 kg (165 lb 4.8 oz), SpO2 97 %.  I/O:    Intake/Output Summary (Last 24 hours) at 01/04/2017 1247 Last data filed at 01/04/2017 1038 Gross per 24 hour  Intake 963 ml  Output 1900 ml  Net -937 ml    PHYSICAL EXAMINATION:  GENERAL:  66 y.o.-year-old patient lying in the bed with no acute distress.  EYES: Pupils equal, round, reactive to light and accommodation. No  scleral icterus. Extraocular muscles intact.  HEENT: Head atraumatic, normocephalic. Oropharynx and nasopharynx clear.  NECK:  Supple, no jugular venous distention. No thyroid enlargement, no tenderness.  LUNGS: Normal breath sounds bilaterally, no wheezing, rales,rhonchi or crepitation. No use of accessory muscles of respiration.  CARDIOVASCULAR: S1, S2 normal. No murmurs, rubs, or gallops.  ABDOMEN: Soft, non-tender, non-distended. Bowel sounds present. No organomegaly or mass.  EXTREMITIES: No pedal edema, cyanosis, or clubbing.  NEUROLOGIC: Cranial nerves II through XII are intact. Muscle strength 5/5 in all extremities. Sensation intact. Gait not checked.  PSYCHIATRIC: The patient is alert and oriented x 3.  SKIN: No obvious rash, lesion, or ulcer.   DATA REVIEW:   CBC  Recent Labs  Lab 01/03/17 0302  WBC 6.9  HGB 14.0  HCT 42.4  PLT 120*    Chemistries  Recent Labs  Lab 01/03/17 0302  NA 139  K 4.0  CL 104  CO2 30  GLUCOSE 98  BUN 29*  CREATININE 1.91*  CALCIUM 8.5*    Microbiology Results   No results found for this or any previous visit (from the past 240 hour(s)).  RADIOLOGY:  Ct Angio Chest Pe  W And/or Wo Contrast  Result Date: 01/02/2017 CLINICAL DATA:  Shortness of breath and chest pain EXAM: CT ANGIOGRAPHY CHEST WITH CONTRAST TECHNIQUE: Multidetector CT imaging of the chest was performed using the standard protocol during bolus administration of intravenous contrast. Multiplanar CT image reconstructions and MIPs were obtained to evaluate the vascular anatomy. CONTRAST:  75 mL Isovue 370 nonionic COMPARISON:  Chest radiograph January 02, 2017 FINDINGS: Cardiovascular: There is no demonstrable pulmonary embolus. The ascending thoracic aorta has a transverse diameter of 4.2 x 4.1 cm. There is no evident dissection. Note that the contrast bolus is less than optimal for assessment for potential dissection. Visualized great vessels appear unremarkable. There is a  degree of cardiomegaly with left ventricular hypertrophy. There is a minimal pericardial effusion. There is slight calcification in the thoracic aorta. Mediastinum/Nodes: Visualized thyroid appears unremarkable. There are subcentimeter mediastinal lymph nodes which do not meet size criteria for pathologic significance. There is no frank adenopathy by size criteria evident on this study. No esophageal lesions are appreciable. Lungs/Pleura: There is underlying centrilobular emphysematous change. On axial slice 14 series 6, there is a nodular opacity in the apical segment of the right upper lobe measuring 7 x 5 mm. On axial slice 45 series 6, there is a nodular opacity in the posterior segment of the right upper lobe measuring 6 x 5 mm. There is a fairly small but present free-flowing pleural effusion on the right. There is atelectatic change in the posterior right base adjacent to the pleural effusion. There is also patchy atelectasis in both lower lobes. No well-defined airspace consolidation. Upper Abdomen: In the visualized upper abdomen, there is reflux of contrast into the at inferior vena cava and hepatic veins. Visualized upper abdominal structures otherwise appear unremarkable. Musculoskeletal: There is degenerative change in the thoracic spine. No blastic or lytic bone lesions. Review of the MIP images confirms the above findings. IMPRESSION: 1.  No demonstrable pulmonary embolus. 2. Ascending thoracic aorta measures 4.2 x 4.1 cm. No dissection evident. Note that the contrast bolus in the aorta is not optimal for assessment for potential dissection. 3.  Mild aortic atherosclerosis. 4. Cardiomegaly with left ventricular hypertrophy. The minimal pericardial effusion. 5. Fairly small but present pleural effusion on the right with right base atelectasis adjacent to the fusion. Patchy bibasilar atelectasis elsewhere. No edema or consolidation. There is a degree of underlying centrilobular emphysema. 6. Pulmonary  nodular opacities, largest measuring 6 mm in the apical segment right upper lobe. Non-contrast chest CT at 6-12 months is recommended. If the nodule is stable at time of repeat CT, then future CT at 18-24 months (from today's scan) is considered optional for low-risk patients, but is recommended for high-risk patients. This recommendation follows the consensus statement: Guidelines for Management of Incidental Pulmonary Nodules Detected on CT Images: From the Fleischner Society 2017; Radiology 2017; 284:228-243. 7.  No evident thoracic adenopathy. 8. Reflux of contrast into the inferior vena cava and hepatic veins may indicate a degree of increased right heart pressure. Aortic Atherosclerosis (ICD10-I70.0) and Emphysema (ICD10-J43.9). Electronically Signed   By: Lowella Grip III M.D.   On: 01/02/2017 14:45   Dg Chest Portable 1 View  Result Date: 01/02/2017 CLINICAL DATA:  Chest pain for 3 days. EXAM: PORTABLE CHEST 1 VIEW COMPARISON:  None. FINDINGS: Cardiomegaly with mild bibasilar interstitial prominence but no overt pulmonary edema. No focal airspace consolidation. Blunting of the right costophrenic angle might be a small pleural effusion. The left costophrenic angle is normal. No pneumothorax.  IMPRESSION: Cardiomegaly without overt pulmonary edema. Electronically Signed   By: Ulyses Jarred M.D.   On: 01/02/2017 13:36     Management plans discussed with the patient, family and they are in agreement.  CODE STATUS:     Code Status Orders  (From admission, onward)        Start     Ordered   01/02/17 2057  Full code  Continuous     01/02/17 2056    Code Status History    Date Active Date Inactive Code Status Order ID Comments User Context   This patient has a current code status but no historical code status.      TOTAL TIME TAKING CARE OF THIS PATIENT: *40* minutes.    Stana Bayon M.D on 01/04/2017 at 12:47 PM  Between 7am to 6pm - Pager - [phone] After 6pm go to  [email or web address] - password [email or web address] [facility]  Office  [phone]  CC: Primary care physician; Patient, No Pcp Per

## 2017-01-05 ENCOUNTER — Ambulatory Visit: Payer: Self-pay

## 2017-01-05 NOTE — Discharge Summary (Signed)
[place] [facility] [place] NAME: Kennis Wissmann    MR#:  [id number]  DATE OF BIRTH:  06/02/1950  DATE OF ADMISSION:  01/02/2017 ADMITTING PHYSICIAN: Dustin Flock, MD  DATE OF DISCHARGE: 01/05/2017  PRIMARY CARE PHYSICIAN: Patient, No Pcp Per    ADMISSION DIAGNOSIS:  Abnormal EKG [R94.31] AKI (acute kidney injury) ([place]) [N17.9] Acute on chronic congestive heart failure, unspecified heart failure type ([place]) [I50.9]  DISCHARGE DIAGNOSIS:  Acute systolic CHF Hypertension CAD Medication noncompliance CKD-III SECONDARY DIAGNOSIS:   Past Medical History:  Diagnosis Date  . Coronary artery disease   . Hypertension   . Irregular heart beat     HOSPITAL COURSE:   Danthony Kendrix Warrenis a 66 y.o.malewith h/o CAD, HTN, and irregular heart rhythm who presents for evaluation of SOB and chest heaviness. Patient reports thathis symptoms have been constant and getting progressively worse for the last 4 days. He has been coughing clear sputum, has severe shortness of breath that is worse with minimal exertion or laying flat. Has been propping himself up into pillows.   1.Acute CHF type unknown IV Lasix bid---changed to 20 mg daily Lasix -UOP >5ooocc -Echo showed EF of 25-30% Cardiology consult  with dr Clayborn Bigness initiated Patient reports having left heart catheterization done in [facility] in [place] about a year ago did not require any intervention.  He does not remember the results. -Continue lisinopril and Coreg -Continue aspirin  2.Chest pressure on aspirin Cardiac enzymes 0.0 3.03.04..demand ischemia in the setting of CHF -no Chest pain today -prn nitro  3.Accelerated hypertension - on Coreg and ACE inhibitor -Urine drug screen negative  4.Nicotine abuse smoking cessation provided 4 minutes spent with patient recommended to stop smoking Nicotine patch  5.  Elevated creatinine patient likely has underlying  CKD -I do not have baseline creatinine   At baseline. Doing ok He is [facility]  He will pick up his medications from medication management clinic.  He is also set up to follow-up with the congestive heart failure clinic.  D/c home  CONSULTS OBTAINED:  Treatment Team:  Yolonda Kida, MD  DRUG ALLERGIES:   Allergies  Allergen Reactions  . Penicillins Hives    Has patient had a PCN reaction causing immediate rash, facial/tongue/throat swelling, SOB or lightheadedness with hypotension: Yes Has patient had a PCN reaction causing severe rash involving mucus membranes or skin necrosis: No Has patient had a PCN reaction that required hospitalization: No Has patient had a PCN reaction occurring within the last 10 years: No If all of the above answers are "NO", then may proceed with Cephalosporin use.    DISCHARGE MEDICATIONS:   Current Discharge Medication List    START taking these medications   Details  aspirin 81 MG tablet Take 1 tablet (81 mg total) daily by mouth. Qty: 30 tablet, Refills: 1    carvedilol (COREG) 3.125 MG tablet Take 1 tablet (3.125 mg total) 2 (two) times daily with a meal by mouth. Qty: 60 tablet, Refills: 2    feeding supplement, ENSURE ENLIVE, (ENSURE ENLIVE) LIQD Take 237 mLs 3 (three) times daily between meals by mouth. Qty: 237 mL, Refills: 12    furosemide (LASIX) 20 MG tablet Take 1 tablet (20 mg total) daily by mouth. Qty: 30 tablet, Refills: 1    lisinopril (PRINIVIL,ZESTRIL) 5 MG tablet Take 1 tablet (5 mg total) daily by mouth. Qty: 30 tablet, Refills: 0    Multiple  Vitamin (MULTIVITAMIN WITH MINERALS) TABS tablet Take 1 tablet daily by mouth. Qty: 30 tablet, Refills: 0    nitroGLYCERIN (NITROSTAT) 0.4 MG SL tablet Place 1 tablet (0.4 mg total) every 5 (five) minutes as needed under the tongue for chest pain. Qty: 20 tablet, Refills: 12        If you experience worsening of your admission  symptoms, develop shortness of breath, life threatening emergency, suicidal or homicidal thoughts you must seek medical attention immediately by calling 911 or calling your MD immediately  if symptoms less severe.  You Must read complete instructions/literature along with all the possible adverse reactions/side effects for all the Medicines you take and that have been prescribed to you. Take any new Medicines after you have completely understood and accept all the possible adverse reactions/side effects.   Please note  You were cared for by a hospitalist during your hospital stay. If you have any questions about your discharge medications or the care you received while you were in the hospital after you are discharged, you can call the unit and asked to speak with the hospitalist on call if the hospitalist that took care of you is not available. Once you are discharged, your primary care physician will handle any further medical issues. Please note that NO REFILLS for any discharge medications will be authorized once you are discharged, as it is imperative that you return to your primary care physician (or establish a relationship with a primary care physician if you do not have one) for your aftercare needs so that they can reassess your need for medications and monitor your lab values. Today   SUBJECTIVE   Mild dry cough  VITAL SIGNS:  Blood pressure 128/90, pulse 83, temperature 98.5 F (36.9 C), temperature source Oral, resp. rate 17, height 6\' 4"  (1.93 m), weight 76 kg (167 lb 8 oz), SpO2 100 %.  I/O:    Intake/Output Summary (Last 24 hours) at 01/05/2017 0751 Last data filed at 01/05/2017 0500 Gross per 24 hour  Intake 843 ml  Output 1900 ml  Net -1057 ml    PHYSICAL EXAMINATION:  GENERAL:  66 y.o.-year-old patient lying in the bed with no acute distress.  EYES: Pupils equal, round, reactive to light and accommodation. No scleral icterus. Extraocular muscles intact.  HEENT: Head  atraumatic, normocephalic. Oropharynx and nasopharynx clear.  NECK:  Supple, no jugular venous distention. No thyroid enlargement, no tenderness.  LUNGS: Normal breath sounds bilaterally, no wheezing, rales,rhonchi or crepitation. No use of accessory muscles of respiration.  CARDIOVASCULAR: S1, S2 normal. No murmurs, rubs, or gallops.  ABDOMEN: Soft, non-tender, non-distended. Bowel sounds present. No organomegaly or mass.  EXTREMITIES: No pedal edema, cyanosis, or clubbing.  NEUROLOGIC: Cranial nerves II through XII are intact. Muscle strength 5/5 in all extremities. Sensation intact. Gait not checked.  PSYCHIATRIC: The patient is alert and oriented x 3.  SKIN: No obvious rash, lesion, or ulcer.   DATA REVIEW:   CBC  Recent Labs  Lab 01/03/17 0302  WBC 6.9  HGB 14.0  HCT 42.4  PLT 120*    Chemistries  Recent Labs  Lab 01/03/17 0302  NA 139  K 4.0  CL 104  CO2 30  GLUCOSE 98  BUN 29*  CREATININE 1.91*  CALCIUM 8.5*    Microbiology Results   No results found for this or any previous visit (from the past 240 hour(s)).  RADIOLOGY:  No results found.   Management plans discussed with the patient, family and  they are in agreement.  CODE STATUS:     Code Status Orders  (From admission, onward)        Start     Ordered   01/02/17 2057  Full code  Continuous     01/02/17 2056    Code Status History    Date Active Date Inactive Code Status Order ID Comments User Context   This patient has a current code status but no historical code status.      TOTAL TIME TAKING CARE OF THIS PATIENT: *40* minutes.    Lindia Garms M.D on 01/05/2017 at 7:51 AM  Between 7am to 6pm - Pager - [phone] After 6pm go to [email or web address] - password [email or web address] [facility]  Office  [phone]  CC: Primary care physician; Patient, No Pcp Per

## 2017-01-05 NOTE — Care Management (Signed)
Have contacted [facility] to request assistance with the application asap.  Informed patient can proceed across the street at discharge if can present before 12:30 and staff will assist patient with his application.  Patient provided with his discharge meds filled by the clinic

## 2017-01-05 NOTE — Progress Notes (Signed)
Notified Dr. Estanislado Pandy that patient complaining of shortness of breath. Lungs clear. SATS at 100% on RA. No new orders. Continue to monitor.

## 2017-01-05 NOTE — Care Management (Signed)
Patient returned to unit saying he lost all of his paper work and does not have anything to give them at the [facility].  Discussed with patient that this CM informed him yesterday that scripts had been faxed and today he was to proceed to the clinic before 12 noon to pick up meds and for assist completing his application. He does not give any information as to why he did not go to the clinic  when he left.  Instructed him firmly to proceed to the clinic and he verbalized understanding

## 2017-01-05 NOTE — Progress Notes (Signed)
VSS, alert and oriented, no questions.  Escorted out by nursing

## 2017-01-13 ENCOUNTER — Ambulatory Visit: Payer: Self-pay | Admitting: Family

## 2017-01-25 ENCOUNTER — Ambulatory Visit: Payer: Self-pay | Admitting: Family

## 2017-01-26 ENCOUNTER — Ambulatory Visit: Payer: Self-pay | Admitting: Family

## 2017-01-30 ENCOUNTER — Telehealth: Payer: Self-pay | Admitting: Family

## 2017-01-30 ENCOUNTER — Ambulatory Visit: Payer: Self-pay | Admitting: Family

## 2017-01-30 NOTE — Telephone Encounter (Signed)
Patient did not show for his [facility] appointment on 01/30/17. Will attempt to reschedule.

## 2017-02-17 ENCOUNTER — Telehealth: Payer: Self-pay | Admitting: Pharmacy Technician

## 2017-02-17 NOTE — Telephone Encounter (Signed)
Patient failed to provide poi and show-up for eligibility appointment on 01/05/17 at 10:30a.m.  Patient did not reschedule appointment.  No additional medication assistance will be provided by [facility] without the required proof of income documentation.  Patient notified by letter.  [place] [facility]

## 2017-03-07 ENCOUNTER — Other Ambulatory Visit: Payer: Self-pay

## 2017-03-07 ENCOUNTER — Inpatient Hospital Stay
Admission: EM | Admit: 2017-03-07 | Discharge: 2017-03-11 | DRG: 291 | Disposition: A | Discharge status: Home / Self Care | Payer: Medicaid Other | Attending: Family Medicine | Admitting: Family Medicine

## 2017-03-07 ENCOUNTER — Emergency Department: Payer: Medicaid Other

## 2017-03-07 ENCOUNTER — Encounter: Payer: Self-pay | Admitting: Emergency Medicine

## 2017-03-07 DIAGNOSIS — Z79899 Other long term (current) drug therapy: Secondary | ICD-10-CM

## 2017-03-07 DIAGNOSIS — F331 Major depressive disorder, recurrent, moderate: Secondary | ICD-10-CM | POA: Diagnosis present

## 2017-03-07 DIAGNOSIS — Z9111 Patient's noncompliance with dietary regimen: Secondary | ICD-10-CM

## 2017-03-07 DIAGNOSIS — F4310 Post-traumatic stress disorder, unspecified: Secondary | ICD-10-CM

## 2017-03-07 DIAGNOSIS — F1410 Cocaine abuse, uncomplicated: Secondary | ICD-10-CM

## 2017-03-07 DIAGNOSIS — E782 Mixed hyperlipidemia: Secondary | ICD-10-CM | POA: Diagnosis present

## 2017-03-07 DIAGNOSIS — I130 Hypertensive heart and chronic kidney disease with heart failure and stage 1 through stage 4 chronic kidney disease, or unspecified chronic kidney disease: Principal | ICD-10-CM | POA: Diagnosis present

## 2017-03-07 DIAGNOSIS — F17210 Nicotine dependence, cigarettes, uncomplicated: Secondary | ICD-10-CM | POA: Diagnosis present

## 2017-03-07 DIAGNOSIS — N183 Chronic kidney disease, stage 3 (moderate): Secondary | ICD-10-CM | POA: Diagnosis present

## 2017-03-07 DIAGNOSIS — Z91128 Patient's intentional underdosing of medication regimen for other reason: Secondary | ICD-10-CM | POA: Diagnosis not present

## 2017-03-07 DIAGNOSIS — R0600 Dyspnea, unspecified: Secondary | ICD-10-CM

## 2017-03-07 DIAGNOSIS — Z7982 Long term (current) use of aspirin: Secondary | ICD-10-CM

## 2017-03-07 DIAGNOSIS — Z91199 Patient's noncompliance with other medical treatment and regimen due to unspecified reason: Secondary | ICD-10-CM

## 2017-03-07 DIAGNOSIS — I2510 Atherosclerotic heart disease of native coronary artery without angina pectoris: Secondary | ICD-10-CM | POA: Diagnosis present

## 2017-03-07 DIAGNOSIS — Z590 Homelessness: Secondary | ICD-10-CM

## 2017-03-07 DIAGNOSIS — Z9119 Patient's noncompliance with other medical treatment and regimen: Secondary | ICD-10-CM

## 2017-03-07 DIAGNOSIS — I509 Heart failure, unspecified: Secondary | ICD-10-CM

## 2017-03-07 DIAGNOSIS — I5033 Acute on chronic diastolic (congestive) heart failure: Secondary | ICD-10-CM

## 2017-03-07 DIAGNOSIS — Z880 Allergy status to penicillin: Secondary | ICD-10-CM | POA: Diagnosis not present

## 2017-03-07 DIAGNOSIS — I5023 Acute on chronic systolic (congestive) heart failure: Secondary | ICD-10-CM | POA: Diagnosis present

## 2017-03-07 DIAGNOSIS — R0609 Other forms of dyspnea: Secondary | ICD-10-CM

## 2017-03-07 DIAGNOSIS — A150 Tuberculosis of lung: Secondary | ICD-10-CM

## 2017-03-07 DIAGNOSIS — T501X6A Underdosing of loop [high-ceiling] diuretics, initial encounter: Secondary | ICD-10-CM | POA: Diagnosis present

## 2017-03-07 DIAGNOSIS — I248 Other forms of acute ischemic heart disease: Secondary | ICD-10-CM | POA: Diagnosis present

## 2017-03-07 HISTORY — DX: Heart failure, unspecified: I50.9

## 2017-03-07 LAB — BASIC METABOLIC PANEL
ANION GAP: 6 (ref 5–15)
BUN: 25 mg/dL — AB (ref 6–20)
CALCIUM: 8.5 mg/dL — AB (ref 8.9–10.3)
CO2: 22 mmol/L (ref 22–32)
Chloride: 110 mmol/L (ref 101–111)
Creatinine, Ser: 1.78 mg/dL — ABNORMAL HIGH (ref 0.61–1.24)
GFR calc Af Amer: 45 mL/min — ABNORMAL LOW (ref >60)
GFR calc non Af Amer: 39 mL/min — ABNORMAL LOW (ref >60)
Glucose, Bld: 144 mg/dL — ABNORMAL HIGH (ref 65–99)
POTASSIUM: 3.7 mmol/L (ref 3.5–5.1)
SODIUM: 138 mmol/L (ref 135–145)

## 2017-03-07 LAB — CBC
HEMATOCRIT: 42.5 % (ref 40.0–52.0)
Hemoglobin: 14.2 g/dL (ref 13.0–18.0)
MCH: 30 pg (ref 26.0–34.0)
MCHC: 33.4 g/dL (ref 32.0–36.0)
MCV: 89.7 fL (ref 80.0–100.0)
Platelets: 127 10*3/uL — ABNORMAL LOW (ref 150–440)
RBC: 4.74 MIL/uL (ref 4.40–5.90)
RDW: 14.7 % — AB (ref 11.5–14.5)
WBC: 4.3 10*3/uL (ref 3.8–10.6)

## 2017-03-07 LAB — TROPONIN I: TROPONIN I: 0.03 ng/mL — AB (ref <0.03)

## 2017-03-07 LAB — BRAIN NATRIURETIC PEPTIDE: B NATRIURETIC PEPTIDE 5: 1609 pg/mL — AB (ref 0.0–100.0)

## 2017-03-07 MED ORDER — LISINOPRIL 5 MG PO TABS
5.0000 mg | ORAL_TABLET | Freq: Every day | ORAL | Status: DC
  Administered 2017-03-07 – 2017-03-11 (×5): 5 mg via ORAL
  Filled 2017-03-07 (×5): qty 1

## 2017-03-07 MED ORDER — FUROSEMIDE 10 MG/ML IJ SOLN
60.0000 mg | Freq: Once | INTRAMUSCULAR | Status: AC
  Administered 2017-03-07: 60 mg via INTRAVENOUS
  Filled 2017-03-07: qty 8

## 2017-03-07 MED ORDER — NITROGLYCERIN 0.4 MG SL SUBL: 0.4000 mg | SUBLINGUAL_TABLET | SUBLINGUAL | Status: DC | PRN

## 2017-03-07 MED ORDER — ALBUTEROL SULFATE (2.5 MG/3ML) 0.083% IN NEBU: 2.5000 mg | INHALATION_SOLUTION | Freq: Four times a day (QID) | RESPIRATORY_TRACT | Status: DC | PRN

## 2017-03-07 MED ORDER — ASPIRIN EC 81 MG PO TBEC
81.0000 mg | DELAYED_RELEASE_TABLET | Freq: Every day | ORAL | Status: DC
  Administered 2017-03-07 – 2017-03-11 (×5): 81 mg via ORAL
  Filled 2017-03-07 (×5): qty 1

## 2017-03-07 MED ORDER — ONDANSETRON HCL 4 MG/2ML IJ SOLN: 4.0000 mg | Freq: Four times a day (QID) | INTRAMUSCULAR | Status: DC | PRN

## 2017-03-07 MED ORDER — FUROSEMIDE 10 MG/ML IJ SOLN
40.0000 mg | Freq: Two times a day (BID) | INTRAMUSCULAR | Status: DC
  Administered 2017-03-08 – 2017-03-10 (×5): 40 mg via INTRAVENOUS
  Filled 2017-03-07 (×5): qty 4

## 2017-03-07 MED ORDER — POLYETHYLENE GLYCOL 3350 17 G PO PACK: 17.0000 g | PACK | Freq: Every day | ORAL | Status: DC | PRN

## 2017-03-07 MED ORDER — CARVEDILOL 3.125 MG PO TABS
3.1250 mg | ORAL_TABLET | Freq: Two times a day (BID) | ORAL | Status: DC
  Administered 2017-03-07 – 2017-03-11 (×8): 3.125 mg via ORAL
  Filled 2017-03-07 (×8): qty 1

## 2017-03-07 MED ORDER — ONDANSETRON HCL 4 MG PO TABS: 4.0000 mg | ORAL_TABLET | Freq: Four times a day (QID) | ORAL | Status: DC | PRN

## 2017-03-07 MED ORDER — ENOXAPARIN SODIUM 40 MG/0.4ML ~~LOC~~ SOLN: 40.0000 mg | SUBCUTANEOUS | Status: DC

## 2017-03-07 NOTE — ED Provider Notes (Signed)
[facility] Emergency Department Provider Note  ____________________________________________   I have reviewed the triage vital signs and the nursing notes. Where available I have reviewed prior notes and, if possible and indicated, outside hospital notes.    HISTORY  Chief Complaint Shortness of Breath    HPI Jonathon Rios is a 67 y.o. male  presents today complaining of shortness of breath, patient has a history of significant CHF with an EF of 25-30%, he has been noncompliant with his medications primarily for financial reasons he states.  He states he has not really had any of his Lasix since he went home hospital in November.  He denies chest pain at this time but he states he has had some intermittent poorly described chest pain which he states "comes and goes.  It seems like it is worse when he coughs.  He does have a history of cough associated with his CHF and is been coughing off and on "a little bit" with nonproductive cough for the last couple weeks.  His legs have not swollen and he has had no fever.  He states he does not usually have swelling legs with his CHF.  He has significant orthopnea and his dyspnea is to the point where he cannot get around the house.  For the last week, things been getting worse, not tried anything at home, does not feel that he is safe to go home   Past Medical History:  Diagnosis Date  . CHF (congestive heart failure) ([place])   . Coronary artery disease   . Hypertension   . Irregular heart beat     Patient Active Problem List   Diagnosis Date Noted  . Acute CHF (congestive heart failure) ([place]) 01/02/2017    History reviewed. No pertinent surgical history.  Prior to Admission medications   Medication Sig Start Date End Date Taking? Authorizing Provider  aspirin 81 MG tablet Take 1 tablet (81 mg total) daily by mouth. 01/05/17   Fritzi Mandes, MD  carvedilol (COREG) 3.125 MG tablet Take 1 tablet (3.125 mg total) 2  (two) times daily with a meal by mouth. 01/04/17   Fritzi Mandes, MD  feeding supplement, ENSURE ENLIVE, (ENSURE ENLIVE) LIQD Take 237 mLs 3 (three) times daily between meals by mouth. 01/04/17   Fritzi Mandes, MD  furosemide (LASIX) 20 MG tablet Take 1 tablet (20 mg total) daily by mouth. 01/05/17   Fritzi Mandes, MD  lisinopril (PRINIVIL,ZESTRIL) 5 MG tablet Take 1 tablet (5 mg total) daily by mouth. 01/05/17   Fritzi Mandes, MD  Multiple Vitamin (MULTIVITAMIN WITH MINERALS) TABS tablet Take 1 tablet daily by mouth. 01/05/17   Fritzi Mandes, MD  nitroGLYCERIN (NITROSTAT) 0.4 MG SL tablet Place 1 tablet (0.4 mg total) every 5 (five) minutes as needed under the tongue for chest pain. 01/04/17   Fritzi Mandes, MD    Allergies Penicillins  History reviewed. No pertinent family history.  Social History Social History   Tobacco Use  . Smoking status: Current Some Day Smoker    Packs/day: 1.00    Years: 50.00    Pack years: 50.00    Types: Cigarettes  . Smokeless tobacco: Never Used  Substance Use Topics  . Alcohol use: No    Frequency: Never  . Drug use: Not on file    Review of Systems Constitutional: No fever/chills Eyes: No visual changes. ENT: No sore throat. No stiff neck no neck pain Cardiovascular: + chest pain. Respiratory: + shortness of breath. Gastrointestinal:  no vomiting.  No diarrhea.  No constipation. Genitourinary: Negative for dysuria. Musculoskeletal: Negative lower extremity swelling Skin: Negative for rash. Neurological: Negative for severe headaches, focal weakness or numbness.   ____________________________________________   PHYSICAL EXAM:  VITAL SIGNS: ED Triage Vitals  Enc Vitals Group     BP 03/07/17 1438 (!) 138/104     Pulse Rate 03/07/17 1438 78     Resp 03/07/17 1438 (!) 28     Temp 03/07/17 1438 (!) 97.5 F (36.4 C)     Temp Source 03/07/17 1438 Oral     SpO2 03/07/17 1438 100 %     Weight 03/07/17 1439 169 lb (76.7 kg)     Height 03/07/17 1439 6'  3" (1.905 m)     Head Circumference --      Peak Flow --      Pain Score 03/07/17 1429 7     Pain Loc --      Pain Edu? --      Excl. in [place]? --     Constitutional: Alert and oriented. Well appearing and in no acute distress. Eyes: Conjunctivae are normal Head: Atraumatic HEENT: No congestion/rhinnorhea. Mucous membranes are moist.  Oropharynx non-erythematous Neck:   Nontender with no meningismus, no masses, no stridor Cardiovascular: Normal rate, regular rhythm. Grossly normal heart sounds.  Good peripheral circulation. Respiratory: Increased respiratory rate and effort, when patient sits up in the bed he gets winded, lungs diminished in the bases but no rales or rhonchi appreciated. Chest: Tender to palpation to the chest wall in the costochondral margins when I palpate these areas patient states "ouch that the pain right there" and post back no crepitus no flail chest. Abdominal: Soft and nontender. No distention. No guarding no rebound Back:  There is no focal tenderness or step off.  there is no midline tenderness there are no lesions noted. there is no CVA tenderness Musculoskeletal: No lower extremity tenderness, no upper extremity tenderness. No joint effusions, no DVT signs strong distal pulses no edema Neurologic:  Normal speech and language. No gross focal neurologic deficits are appreciated.  Skin:  Skin is warm, dry and intact. No rash noted. Psychiatric: Mood and affect are normal. Speech and behavior are normal.  ____________________________________________   LABS (all labs ordered are listed, but only abnormal results are displayed)  Labs Reviewed  BASIC METABOLIC PANEL - Abnormal; Notable for the following components:      Result Value   Glucose, Bld 144 (*)    BUN 25 (*)    Creatinine, Ser 1.78 (*)    Calcium 8.5 (*)    GFR calc non Af Amer 39 (*)    GFR calc Af Amer 45 (*)    All other components within normal limits  CBC - Abnormal; Notable for the following  components:   RDW 14.7 (*)    Platelets 127 (*)    All other components within normal limits  TROPONIN I - Abnormal; Notable for the following components:   Troponin I 0.03 (*)    All other components within normal limits  BRAIN NATRIURETIC PEPTIDE - Abnormal; Notable for the following components:   B Natriuretic Peptide 1,609.0 (*)    All other components within normal limits    Pertinent labs  results that were available during my care of the patient were reviewed by me and considered in my medical decision making (see chart for details). ____________________________________________  EKG  I personally interpreted any EKGs ordered by me or triage  Normal sinus rhythm at 74 bpm no acute ST elevation or depression LAD noted, flipped T waves noted laterally, prolonged QT noted, changes consistent with prior ____________________________________________  RADIOLOGY  Pertinent labs & imaging results that were available during my care of the patient were reviewed by me and considered in my medical decision making (see chart for details). If possible, patient and/or family made aware of any abnormal findings.  Dg Chest 2 View  Result Date: 03/07/2017 CLINICAL DATA:  Shortness of breath and chest pain EXAM: CHEST  2 VIEW COMPARISON:  01/02/2017 FINDINGS: Cardiac shadow is stable. Tortuosity of thoracic aorta is noted. The lungs are well aerated bilaterally. No focal infiltrate or sizable effusion is seen. IMPRESSION: No active cardiopulmonary disease. Electronically Signed   By: Inez Catalina M.D.   On: 03/07/2017 15:16   ____________________________________________    PROCEDURES  Procedure(s) performed: None  Procedures  Critical Care performed: CRITICAL CARE Performed by: Schuyler Amor   Total critical care time: 35 minutes  Critical care time was exclusive of separately billable procedures and treating other patients.  Critical care was necessary to treat or prevent imminent or  life-threatening deterioration.  Critical care was time spent personally by me on the following activities: development of treatment plan with patient and/or surrogate as well as nursing, discussions with consultants, evaluation of patient's response to treatment, examination of patient, obtaining history from patient or surrogate, ordering and performing treatments and interventions, ordering and review of laboratory studies, ordering and review of radiographic studies, pulse oximetry and re-evaluation of patient's condition.   ____________________________________________   INITIAL IMPRESSION / ASSESSMENT AND PLAN / ED COURSE  Pertinent labs & imaging results that were available during my care of the patient were reviewed by me and considered in my medical decision making (see chart for details).  Patient here with increasing dyspnea in the context of significant CHF with a very low EF and noncompliance.  Sats are good and no significant lower extremity swelling but with orthopnea, and noncompliance and low EF I do feel that the patient's diagnosis is most likely consistent with a CHF picture.  I do not think CT scan is indicated.  We will diurese the patient to see if he feels better.  Patient gets significant winded just moving in the bed and I do not think he is safe for discharge.  I did consider PE and pneumonia is a possibility but I feel given the obvious diagnosis that diuresis probably should antecede any further imaging at this time in terms of benefit to the patient.  Patient does have a history of chronic renal insufficiency is at his baseline troponin is borderline.  He does have a very atypical chest pain which she states is present with his cough which seems to be cardiogenic cough.  It is reproducible and not ongoing at this time    ____________________________________________   FINAL CLINICAL IMPRESSION(S) / ED DIAGNOSES  Final diagnoses:  None      This chart was  dictated using voice recognition software.  Despite best efforts to proofread,  errors can occur which can change meaning.      Schuyler Amor, MD 03/07/17 [id number]

## 2017-03-07 NOTE — ED Triage Notes (Signed)
Pt here for [facility] and CP. Feels like his heart failure acting up.  Reports out of pills for 2-3 weeks and has felt back over last week.  No increased swelling per pt.  Labored in triage.

## 2017-03-07 NOTE — ED Notes (Signed)
Patient given water per MD 

## 2017-03-07 NOTE — H&P (Signed)
[place] at [employer] NAME: Jonathon Rios    MR#:  [id number]  DATE OF BIRTH:  November 26, 1950  DATE OF ADMISSION:  03/07/2017  PRIMARY CARE PHYSICIAN: Patient, No Pcp Per   REQUESTING/REFERRING PHYSICIAN: Mcshane  CHIEF COMPLAINT:   Increasing shortness of breath I ran out of my medication HISTORY OF PRESENT ILLNESS:  Jonathon Rios  is a 67 y.o. male with a known history of systolic congestive heart failure, CAD, hypertension comes to the emergency room with shortness of breath.  Patient reports his symptoms have been getting worse.  He has ran out of his Lasix for a while.  He is not able to get around to go for follow-up and neither is he able to go to the medication management clinic for picking up his medication.  He was found to have BNP of 1600.  Patient is being admitted with acute on chronic systolic congestive heart failure secondary to medication and dietary noncompliance.   PAST MEDICAL HISTORY:   Past Medical History:  Diagnosis Date  . CHF (congestive heart failure) ([place])   . Coronary artery disease   . Hypertension   . Irregular heart beat     PAST SURGICAL HISTOIRY:  History reviewed. No pertinent surgical history.  SOCIAL HISTORY:   Social History   Tobacco Use  . Smoking status: Current Some Day Smoker    Packs/day: 1.00    Years: 50.00    Pack years: 50.00    Types: Cigarettes  . Smokeless tobacco: Never Used  Substance Use Topics  . Alcohol use: No    Frequency: Never    FAMILY HISTORY:  History reviewed. No pertinent family history.  DRUG ALLERGIES:   Allergies  Allergen Reactions  . Penicillins Hives    Has patient had a PCN reaction causing immediate rash, facial/tongue/throat swelling, SOB or lightheadedness with hypotension: Yes Has patient had a PCN reaction causing severe rash involving mucus membranes or skin necrosis: No Has patient had a PCN reaction that required hospitalization:  No Has patient had a PCN reaction occurring within the last 10 years: No If all of the above answers are "NO", then may proceed with Cephalosporin use.    REVIEW OF SYSTEMS:  Review of Systems  Constitutional: Negative for chills, fever and weight loss.  HENT: Negative for ear discharge, ear pain and nosebleeds.   Eyes: Negative for blurred vision, pain and discharge.  Respiratory: Positive for shortness of breath. Negative for sputum production, wheezing and stridor.   Cardiovascular: Positive for PND. Negative for chest pain, palpitations and orthopnea.  Gastrointestinal: Negative for abdominal pain, diarrhea, nausea and vomiting.  Genitourinary: Negative for frequency and urgency.  Musculoskeletal: Negative for back pain and joint pain.  Neurological: Positive for weakness. Negative for sensory change, speech change and focal weakness.  Psychiatric/Behavioral: Negative for depression and hallucinations. The patient is not nervous/anxious.      MEDICATIONS AT HOME:   Prior to Admission medications   Medication Sig Start Date End Date Taking? Authorizing Provider  aspirin 81 MG tablet Take 1 tablet (81 mg total) daily by mouth. 01/05/17  Yes Fritzi Mandes, MD  carvedilol (COREG) 3.125 MG tablet Take 1 tablet (3.125 mg total) 2 (two) times daily with a meal by mouth. 01/04/17  Yes Fritzi Mandes, MD  feeding supplement, ENSURE ENLIVE, (ENSURE ENLIVE) LIQD Take 237 mLs 3 (three) times daily between meals by mouth. 01/04/17  Yes Fritzi Mandes, MD  furosemide (LASIX) 20 MG  tablet Take 1 tablet (20 mg total) daily by mouth. 01/05/17  Yes Fritzi Mandes, MD  ibuprofen (ADVIL,MOTRIN) 200 MG tablet Take 200-400 mg by mouth every 6 (six) hours as needed.   Yes [provider]  lisinopril (PRINIVIL,ZESTRIL) 5 MG tablet Take 1 tablet (5 mg total) daily by mouth. 01/05/17  Yes Fritzi Mandes, MD  Multiple Vitamin (MULTIVITAMIN WITH MINERALS) TABS tablet Take 1 tablet daily by mouth. 01/05/17  Yes Fritzi Mandes, MD  nitroGLYCERIN (NITROSTAT) 0.4 MG SL tablet Place 1 tablet (0.4 mg total) every 5 (five) minutes as needed under the tongue for chest pain. 01/04/17  Yes Fritzi Mandes, MD      VITAL SIGNS:  Blood pressure (!) 138/104, pulse 78, temperature (!) 97.5 F (36.4 C), temperature source Oral, resp. rate (!) 28, height 6\' 3"  (1.905 m), weight 76.7 kg (169 lb), SpO2 100 %.  PHYSICAL EXAMINATION:  GENERAL:  67 y.o.-year-old patient lying in the bed with no acute distress.  EYES: Pupils equal, round, reactive to light and accommodation. No scleral icterus. Extraocular muscles intact.  HEENT: Head atraumatic, normocephalic. Oropharynx and nasopharynx clear.  NECK:  Supple, no jugular venous distention. No thyroid enlargement, no tenderness.  LUNGS: decreased breath sounds bilaterally, no wheezing, rales,rhonchi or crepitation. No use of accessory muscles of respiration.  CARDIOVASCULAR: S1, S2 normal. No murmurs, rubs, or gallops.  ABDOMEN: Soft, nontender, nondistended. Bowel sounds present. No organomegaly or mass.  EXTREMITIES: No pedal edema, cyanosis, or clubbing.  NEUROLOGIC: Cranial nerves II through XII are intact. Muscle strength 5/5 in all extremities. Sensation intact. Gait not checked.  PSYCHIATRIC: The patient is alert and oriented x 3.  SKIN: No obvious rash, lesion, or ulcer.   LABORATORY PANEL:   CBC Recent Labs  Lab 03/07/17 1437  WBC 4.3  HGB 14.2  HCT 42.5  PLT 127*   ------------------------------------------------------------------------------------------------------------------  Chemistries  Recent Labs  Lab 03/07/17 1437  NA 138  K 3.7  CL 110  CO2 22  GLUCOSE 144*  BUN 25*  CREATININE 1.78*  CALCIUM 8.5*   ------------------------------------------------------------------------------------------------------------------  Cardiac Enzymes Recent Labs  Lab 03/07/17 1437  TROPONINI 0.03*    ------------------------------------------------------------------------------------------------------------------  RADIOLOGY:  Dg Chest 2 View  Result Date: 03/07/2017 CLINICAL DATA:  Shortness of breath and chest pain EXAM: CHEST  2 VIEW COMPARISON:  01/02/2017 FINDINGS: Cardiac shadow is stable. Tortuosity of thoracic aorta is noted. The lungs are well aerated bilaterally. No focal infiltrate or sizable effusion is seen. IMPRESSION: No active cardiopulmonary disease. Electronically Signed   By: Inez Catalina M.D.   On: 03/07/2017 15:16    EKG:   Normal sinus rhythm, left axis deviation, LVH IMPRESSION AND PLAN:   Jonathon Rios  is a 67 y.o. male with a known history of systolic congestive heart failure, CAD, hypertension comes to the emergency room with shortness of breath.  Patient reports his symptoms have been getting worse.  He has ran out of his Lasix for a while.  He is not able to get around to go for follow-up and neither is he able to go to the medication management clinic for picking up his medication.  1.Acute on chronic systolic CHF IV Lasixbid 40mg  bid -Echo  In nov 2018 showed EF of 25-30% Patient reports having left heart catheterization done in [facility] in [place] about a year ago did not require any intervention. He does not remember the results. -Continue lisinopril and Coreg -Continue aspirin  2.Chest pressure due to sob on  aspirin Cardiac enzymes0.03 appears demand ischemia in the setting of CHF -prn nitro  3.Accelerated hypertension - on Coreg and ACE inhibitor  4.Nicotine abuse smoking cessation provided 4 minutes spent with patient recommended to stop smoking Nicotine patch  Care management for discharge planning.    All the records are reviewed and case discussed with ED provider. Management plans discussed with the patient, family and they are in agreement.  CODE STATUS: full TOTAL TIME TAKING CARE OF THIS PATIENT: 50  minutes.    Fritzi Mandes M.D on 03/07/2017 at 5:02 PM  Between 7am to 6pm - Pager - [phone]  After 6pm go to [email or web address] - password [email or web address] [place] [facility]  Office  [phone]  CC: Primary care physician; Patient, No Pcp Per

## 2017-03-07 NOTE — Progress Notes (Signed)
Patient admitted to unit. Oriented to room, call bell, and staff. Bed in lowest position. Fall safety plan reviewed. Full assessment to Epic. Skin assessment verified with Danae Chen RN. Telemetry box verification with tele clerk- Box#: [id number]. Will continue to monitor.

## 2017-03-07 NOTE — ED Notes (Addendum)
Pt ambulatory to ER, pt in NAD. Given wheelchair.

## 2017-03-08 NOTE — Care Management (Signed)
found that patient's pcp on [place] medicaid access is [employer]. An appointment has been made to get established.  CM left a VM message for medicaid transportation to attempt to expedite a medicaid transportation application as this patient is not able to navigate the system. Informed he has "full medicaid coverage.This would include medications

## 2017-03-08 NOTE — Care Management (Signed)
Financial counselor found an [employer] and have reach out to [place] to find who is listed as primary care provider on the account

## 2017-03-08 NOTE — Consult Note (Signed)
[facility] Consultation Note  Patient ID: Jonathon Rios, MRN: [id number], DOB/AGE: 65-Jul-1952 65 y.o. Admit date: 03/07/2017   Date of Consult: 03/08/2017 Primary Physician: Patient, No Pcp Per Primary Cardiologist: None  Chief Complaint:  Chief Complaint  Patient presents with  . Shortness of Breath   Reason for Consult: Heart failure  HPI: 67 y.o. male with known apparent coronary atherosclerosis essential hypertension mixed hyperlipidemia and previous history of congestive heart failure who has moved from the [place] area now having a significant shortness of breath weakness fatigue and weight gain over the last several days.  This is consistent with possible congestive heart failure with a BNP of 03/06/2007 and symptoms of shortness of breath.  The patient has had an EKG showing normal left axis deviation left ventricular hypertrophy with diffuse T wave inversions with a normal troponin without evidence of myocardial infarction.  The patient was previously on appropriate medications but was not able to afford them and therefore has had exacerbations at this time.  He feels much better since admission and has no further issues  Past Medical History:  Diagnosis Date  . CHF (congestive heart failure) ([place])   . Coronary artery disease   . Hypertension   . Irregular heart beat       Surgical History: History reviewed. No pertinent surgical history.   Home Meds: Prior to Admission medications   Medication Sig Start Date End Date Taking? Authorizing Provider  aspirin 81 MG tablet Take 1 tablet (81 mg total) daily by mouth. 01/05/17  Yes Fritzi Mandes, MD  carvedilol (COREG) 3.125 MG tablet Take 1 tablet (3.125 mg total) 2 (two) times daily with a meal by mouth. 01/04/17  Yes Fritzi Mandes, MD  feeding supplement, ENSURE ENLIVE, (ENSURE ENLIVE) LIQD Take 237 mLs 3 (three) times daily between meals by mouth. 01/04/17  Yes Fritzi Mandes, MD  furosemide (LASIX) 20 MG tablet Take 1  tablet (20 mg total) daily by mouth. 01/05/17  Yes Fritzi Mandes, MD  ibuprofen (ADVIL,MOTRIN) 200 MG tablet Take 200-400 mg by mouth every 6 (six) hours as needed.   Yes [provider]  lisinopril (PRINIVIL,ZESTRIL) 5 MG tablet Take 1 tablet (5 mg total) daily by mouth. 01/05/17  Yes Fritzi Mandes, MD  Multiple Vitamin (MULTIVITAMIN WITH MINERALS) TABS tablet Take 1 tablet daily by mouth. 01/05/17  Yes Fritzi Mandes, MD  nitroGLYCERIN (NITROSTAT) 0.4 MG SL tablet Place 1 tablet (0.4 mg total) every 5 (five) minutes as needed under the tongue for chest pain. 01/04/17  Yes Fritzi Mandes, MD    Inpatient Medications:  . aspirin EC  81 mg Oral Daily  . carvedilol  3.125 mg Oral BID WC  . enoxaparin (LOVENOX) injection  40 mg Subcutaneous Q24H  . furosemide  40 mg Intravenous Q12H  . lisinopril  5 mg Oral Daily     Allergies:  Allergies  Allergen Reactions  . Penicillins Hives    Has patient had a PCN reaction causing immediate rash, facial/tongue/throat swelling, SOB or lightheadedness with hypotension: Yes Has patient had a PCN reaction causing severe rash involving mucus membranes or skin necrosis: No Has patient had a PCN reaction that required hospitalization: No Has patient had a PCN reaction occurring within the last 10 years: No If all of the above answers are "NO", then may proceed with Cephalosporin use.    Social History   Socioeconomic History  . Marital status: Married    Spouse name: Not on file  . Number  of children: Not on file  . Years of education: Not on file  . Highest education level: Not on file  Social Needs  . Financial resource strain: Not on file  . Food insecurity - worry: Not on file  . Food insecurity - inability: Not on file  . Transportation needs - medical: Not on file  . Transportation needs - non-medical: Not on file  Occupational History  . Not on file  Tobacco Use  . Smoking status: Current Some Day Smoker    Packs/day: 1.00    Years: 50.00     Pack years: 50.00    Types: Cigarettes  . Smokeless tobacco: Never Used  Substance and Sexual Activity  . Alcohol use: No    Frequency: Never  . Drug use: Not on file  . Sexual activity: Not on file  Other Topics Concern  . Not on file  Social History Narrative  . Not on file     History reviewed. No pertinent family history.   Review of Systems Positive for shortness of breath Negative for: General:  chills, fever, night sweats or weight changes.  Cardiovascular: PND orthopnea syncope dizziness  Dermatological skin lesions rashes Respiratory: Cough congestion Urologic: Frequent urination urination at night and hematuria Abdominal: negative for nausea, vomiting, diarrhea, bright red blood per rectum, melena, or hematemesis Neurologic: negative for visual changes, and/or hearing changes  All other systems reviewed and are otherwise negative except as noted above.  Labs: Recent Labs    03/07/17 1437  TROPONINI 0.03*   Lab Results  Component Value Date   WBC 4.3 03/07/2017   HGB 14.2 03/07/2017   HCT 42.5 03/07/2017   MCV 89.7 03/07/2017   PLT 127 (L) 03/07/2017    Recent Labs  Lab 03/07/17 1437  NA 138  K 3.7  CL 110  CO2 22  BUN 25*  CREATININE 1.78*  CALCIUM 8.5*  GLUCOSE 144*   No results found for: CHOL, HDL, LDLCALC, TRIG No results found for: DDIMER  Radiology/Studies:  Dg Chest 2 View  Result Date: 03/07/2017 CLINICAL DATA:  Shortness of breath and chest pain EXAM: CHEST  2 VIEW COMPARISON:  01/02/2017 FINDINGS: Cardiac shadow is stable. Tortuosity of thoracic aorta is noted. The lungs are well aerated bilaterally. No focal infiltrate or sizable effusion is seen. IMPRESSION: No active cardiopulmonary disease. Electronically Signed   By: Inez Catalina M.D.   On: 03/07/2017 15:16    EKG: Normal sinus rhythm with left deviation left ventricular hypertrophy with diffuse ST and T wave inversions  Weights: Filed Weights   03/07/17 1439 03/08/17 0531   Weight: 76.7 kg (169 lb) 73.1 kg (161 lb 3.2 oz)     Physical Exam: Blood pressure 127/89, pulse 72, temperature 97.7 F (36.5 C), temperature source Oral, resp. rate (!) 24, height 6\' 3"  (1.905 m), weight 73.1 kg (161 lb 3.2 oz), SpO2 99 %. Body mass index is 20.15 kg/m. General: Well developed, well nourished, in no acute distress. Head eyes ears nose throat: Normocephalic, atraumatic, sclera non-icteric, no xanthomas, nares are without discharge. No apparent thyromegaly and/or mass  Lungs: Normal respiratory effort.  no wheezes, some rales, no rhonchi.  Heart: RRR with normal S1 S2. no murmur gallop, no rub, PMI is normal size and placement, carotid upstroke normal without bruit, jugular venous pressure is normal Abdomen: Soft, non-tender, non-distended with normoactive bowel sounds. No hepatomegaly. No rebound/guarding. No obvious abdominal masses. Abdominal aorta is normal size without bruit Extremities: Trace edema. no cyanosis,  no clubbing, no ulcers  Peripheral : 2+ bilateral upper extremity pulses, 2+ bilateral femoral pulses, 2+ bilateral dorsal pedal pulse Neuro: Alert and oriented. No facial asymmetry. No focal deficit. Moves all extremities spontaneously. Musculoskeletal: Normal muscle tone without kyphosis Psych:  Responds to questions appropriately with a normal affect.    Assessment: 67 year old male with acute on chronic systolic dysfunction congestive heart failure essential hypertension mixed hyperlipidemia and coronary artery disease out evidence of myocardial infarction  Plan: 1.  Continue background medication management for cardiomyopathy and LV systolic dysfunction and systolic heart failure including ACE inhibitor and beta-blocker 2.  Furosemide for pulmonary edema shortness of breath 3.  Cardiomyopathy and edema 4.  Sitter echocardiogram for further evaluation and treatment options and extent of therapy dysfunction 5.  Further treatment options after  above  Signed, Corey Skains M.D. [facility] 03/08/2017, 1:12 PM

## 2017-03-08 NOTE — Progress Notes (Signed)
Jonathon Rios NAME: Jonathon Rios    MR#:  [id number]  DATE OF BIRTH:  November 12, 1950  SUBJECTIVE:  CHIEF COMPLAINT:   Chief Complaint  Patient presents with  . Shortness of Breath   Still cough and shortness of breath. REVIEW OF SYSTEMS:  Review of Systems  Constitutional: Negative for chills, fever and malaise/fatigue.  HENT: Negative for sore throat.   Eyes: Negative for blurred vision and double vision.  Respiratory: Positive for cough and shortness of breath. Negative for hemoptysis, wheezing and stridor.   Cardiovascular: Negative for chest pain, palpitations, orthopnea and leg swelling.  Gastrointestinal: Negative for abdominal pain, blood in stool, diarrhea, melena, nausea and vomiting.  Genitourinary: Negative for dysuria, flank pain and hematuria.  Musculoskeletal: Negative for back pain and joint pain.  Skin: Negative for rash.  Neurological: Negative for dizziness, sensory change, focal weakness, seizures, loss of consciousness, weakness and headaches.  Endo/Heme/Allergies: Negative for polydipsia.  Psychiatric/Behavioral: Negative for depression. The patient is not nervous/anxious.     DRUG ALLERGIES:   Allergies  Allergen Reactions  . Penicillins Hives    Has patient had a PCN reaction causing immediate rash, facial/tongue/throat swelling, SOB or lightheadedness with hypotension: Yes Has patient had a PCN reaction causing severe rash involving mucus membranes or skin necrosis: No Has patient had a PCN reaction that required hospitalization: No Has patient had a PCN reaction occurring within the last 10 years: No If all of the above answers are "NO", then may proceed with Cephalosporin use.   VITALS:  Blood pressure 127/89, pulse 72, temperature 97.7 F (36.5 C), temperature source Oral, resp. rate (!) 24, height 6\' 3"  (1.905 m), weight 161 lb 3.2 oz (73.1 kg), SpO2 99 %. PHYSICAL EXAMINATION:  Physical Exam    Constitutional: He is oriented to person, place, and time and well-developed, well-nourished, and in no distress.  HENT:  Head: Normocephalic.  Mouth/Throat: Oropharynx is clear and moist.  Eyes: Conjunctivae and EOM are normal. Pupils are equal, round, and reactive to light. No scleral icterus.  Neck: Normal range of motion. Neck supple. No JVD present. No tracheal deviation present.  Cardiovascular: Normal rate, regular rhythm and normal heart sounds. Exam reveals no gallop.  No murmur heard. Pulmonary/Chest: Effort normal. No respiratory distress. He has no wheezes. He has rales.  Abdominal: Soft. Bowel sounds are normal. He exhibits no distension. There is no tenderness. There is no rebound.  Musculoskeletal: Normal range of motion. He exhibits no edema or tenderness.  Neurological: He is alert and oriented to person, place, and time. No cranial nerve deficit.  Skin: No rash noted. No erythema.  Psychiatric: Affect normal.   LABORATORY PANEL:  Male CBC Recent Labs  Lab 03/07/17 1437  WBC 4.3  HGB 14.2  HCT 42.5  PLT 127*   ------------------------------------------------------------------------------------------------------------------ Chemistries  Recent Labs  Lab 03/07/17 1437  NA 138  K 3.7  CL 110  CO2 22  GLUCOSE 144*  BUN 25*  CREATININE 1.78*  CALCIUM 8.5*   RADIOLOGY:  Dg Chest 2 View  Result Date: 03/07/2017 CLINICAL DATA:  Shortness of breath and chest pain EXAM: CHEST  2 VIEW COMPARISON:  01/02/2017 FINDINGS: Cardiac shadow is stable. Tortuosity of thoracic aorta is noted. The lungs are well aerated bilaterally. No focal infiltrate or sizable effusion is seen. IMPRESSION: No active cardiopulmonary disease. Electronically Signed   By: Inez Catalina M.D.   On: 03/07/2017 15:16   ASSESSMENT AND PLAN:  Jonathon Rios  is a 67 y.o. male with a known history of systolic congestive heart failure, CAD, hypertension comes to the emergency room with shortness of  breath.  Patient reports his symptoms have been getting worse.  He has ran out of his Lasix for a while.  He is not able to get around to go for follow-up and neither is he able to go to the medication management clinic for picking up his medication.  1.Acute on chronic systolic CHF Continue IV Lasixbid 40mg  bid -Echo  In nov 2018 showed EF of 25-30% Patient reports having left heart catheterization done in [facility] in [place] about a year ago did not require any intervention. He does not remember the results. -Continue aspirin, lisinopril and Coreg Follow-up BMP while on Lasix.  2.Chest pressure due to sob on aspirin Cardiac enzymes0.03 appears demand ischemia in the setting of CHF -prn nitro  3.Accelerated hypertension, improved. -on Coreg and ACE inhibitor  4.Nicotine abuse smoking cessation provided  CKD stage III.  Stable.  All the records are reviewed and case discussed with Care Management/Social Worker. Management plans discussed with the patient, family and they are in agreement.  CODE STATUS: Full Code  TOTAL TIME TAKING CARE OF THIS PATIENT: 33 minutes.   More than 50% of the time was spent in counseling/coordination of care: YES  POSSIBLE D/C IN 2 DAYS, DEPENDING ON CLINICAL CONDITION.   Demetrios Loll M.D on 03/08/2017 at 1:19 PM  Between 7am to 6pm - Pager - [phone]  After 6pm go to [email or web address] - [facility]

## 2017-03-08 NOTE — NC FL2 (Deleted)
[place] LEVEL OF CARE SCREENING TOOL     IDENTIFICATION  Patient Name: Jonathon Rios Birthdate: 04-25-1950 Sex: male Admission Date (Current Location): 03/07/2017  [place] and [place] Number:  Jonathon Rios [id number] [place] and Address:  [facility], [address] [place], [place] [address]      Provider Number: [id number]  Attending Physician Name and Address:  Demetrios Loll, MD  Relative Name and Phone Number:  Jonathon Rios  [phone]     Current Level of Care: Hospital Recommended Level of Care: Family Care Home Prior Approval Number:    Date Approved/Denied:   PASRR Number:    Discharge Plan: Domiciliary (Rest home)(Family Care Home)    Current Diagnoses: Patient Active Problem List   Diagnosis Date Noted  . CHF (congestive heart failure) ([place]) 03/07/2017  . Acute CHF (congestive heart failure) ([place]) 01/02/2017    Orientation RESPIRATION BLADDER Height & Weight     Self, Time, Place, Situation  Normal Continent Weight: 161 lb 3.2 oz (73.1 kg) Height:  6\' 3"  (190.5 cm)  BEHAVIORAL SYMPTOMS/MOOD NEUROLOGICAL BOWEL NUTRITION STATUS      Continent Diet(2G sodium diet)  AMBULATORY STATUS COMMUNICATION OF NEEDS Skin   Independent Verbally Normal                       Personal Care Assistance Level of Assistance  Bathing, Feeding, Dressing Bathing Assistance: Independent Feeding assistance: Independent Dressing Assistance: Independent     Functional Limitations Info  Sight, Hearing, Speech Sight Info: Adequate Hearing Info: Adequate Speech Info: Adequate    SPECIAL CARE FACTORS FREQUENCY                       Contractures Contractures Info: Not present    Additional Factors Info  Code Status, Allergies Code Status Info: Full Code Allergies Info: PENICILLINS            Current Medications (03/08/2017):  This is the current hospital active medication list Current  Facility-Administered Medications  Medication Dose Route Frequency Provider Last Rate Last Dose  . albuterol (PROVENTIL) (2.5 MG/3ML) 0.083% nebulizer solution 2.5 mg  2.5 mg Nebulization Q6H PRN Fritzi Mandes, MD      . aspirin EC tablet 81 mg  81 mg Oral Daily Fritzi Mandes, MD   81 mg at 03/08/17 0835  . carvedilol (COREG) tablet 3.125 mg  3.125 mg Oral BID WC Fritzi Mandes, MD   3.125 mg at 03/08/17 1709  . enoxaparin (LOVENOX) injection 40 mg  40 mg Subcutaneous Q24H Fritzi Mandes, MD      . furosemide (LASIX) injection 40 mg  40 mg Intravenous Q12H Fritzi Mandes, MD   40 mg at 03/08/17 1710  . lisinopril (PRINIVIL,ZESTRIL) tablet 5 mg  5 mg Oral Daily Fritzi Mandes, MD   5 mg at 03/08/17 0834  . nitroGLYCERIN (NITROSTAT) SL tablet 0.4 mg  0.4 mg Sublingual Q5 min PRN Fritzi Mandes, MD      . ondansetron [facility]) tablet 4 mg  4 mg Oral Q6H PRN Fritzi Mandes, MD       Or  . ondansetron [facility]) injection 4 mg  4 mg Intravenous Q6H PRN Fritzi Mandes, MD      . polyethylene glycol (MIRALAX / GLYCOLAX) packet 17 g  17 g Oral Daily PRN Fritzi Mandes, MD         Discharge Medications: Please see discharge summary for a list of discharge medications.  Relevant  Imaging Results:  Relevant Lab Results:   Additional Information SSN [id number]  Ross Ludwig, [place]

## 2017-03-08 NOTE — Plan of Care (Signed)
  Activity: Capacity to carry out activities will improve 03/08/2017 1716 - Progressing by Frederico Hamman D, RN Note  Patient DOE however o2 sats remain greater than 90% on room air, see documentation in flowsheet. No c/o pain throughout the shift. Patient currently receiving IV lasix, diuresing well. Denies physical needs at this time however patient anxious about living situation. Consult to SW previously placed and SW is following.

## 2017-03-08 NOTE — Care Management Note (Signed)
Case Management Note  Patient Details  Name: Jonathon Rios MRN: [id number] Date of Birth: 1950-09-20  Subjective/Objective:                 Thi CM set up Medication Management Clinic eligibility appointment for patient at last discharge. he was to go to the clinic when he left the unit for assist as CM felt he would not be able to complete this on his own.  He did not go to the clinic from the unit and never completed the application.  He did not go to the heart failure clinic and did not complete application for [facility]. Was given set of scales November 2018. Unsure if patient's medicaid will cover any services in [place] so unsure if would qualify for [facility] and Medication Management Clinics    Action/Plan:  Reached out to financial counselor to request assist with [employer] medicaid application.  Patient's current medicaid is from [place].  Will discuss para medicine visit with the cardiovascular nurse navigator. Will discuss charity care with [facility] for SN and SW and possible physician that will make home visit. Would have SW assist patient in completing applications   Expected Discharge Date:                  Expected Discharge Plan:     In-House Referral:     Discharge planning Services     Post Acute Care Choice:    Choice offered to:     DME Arranged:    DME Agency:     HH Arranged:    [place] Agency:     Status of Service:     If discussed at [employer], dates discussed:    Additional Comments:  Katrina Stack, RN 03/08/2017, 9:04 AM

## 2017-03-08 NOTE — Progress Notes (Signed)
   03/08/17 0900  Clinical Encounter Type  Visited With Patient  Visit Type Initial;Other (Comment) ([place])  Referral From Nurse  Spiritual Encounters  Spiritual Needs Literature  HCPOA/AD materials dropped off with patient.  [place] available to review as needed

## 2017-03-08 NOTE — Clinical Social Work Note (Signed)
CSW received referral for patient needing assistance with trying to find a place to live.  CSW spoke to patient to discuss options of either going to the shelter, going to a group home, or maybe looking at ALF.  Patient states he has been living in an apartment that his daughter was paying for, however, she is unable to continue to pay for it.  Patient states he has spoken to [place] in August, about trying to apply for disability, however he has not heard back.  CSW explained to patient that he would have to continue to try to contact DSS.  Patient does have Medicaid coming in and SSI, but only a small amount of approximately $65 is what he clears after paying for child support.  Patient gave CSW permission to see if there is anywhere that is able to accept him.  CSW contacted Pilar Grammes who has several group homes to see if she may be able to accept patient.  CSW faxed her requested information, CSW to also try to contact DSS tomorrow to discuss patient.  CSW to continue to follow patient's progress throughout discharge planning.  Jones Broom. [place], MSW, [place]  03/08/2017 6:02 PM

## 2017-03-08 NOTE — Discharge Instructions (Signed)
Heart Failure Clinic appointment on March 16 2017 at 9:20am with Darylene Price, [place]. Please call [phone] to reschedule.

## 2017-03-09 DIAGNOSIS — F1410 Cocaine abuse, uncomplicated: Secondary | ICD-10-CM

## 2017-03-09 DIAGNOSIS — F4310 Post-traumatic stress disorder, unspecified: Secondary | ICD-10-CM

## 2017-03-09 DIAGNOSIS — F1411 Cocaine abuse, in remission: Secondary | ICD-10-CM | POA: Insufficient documentation

## 2017-03-09 DIAGNOSIS — F331 Major depressive disorder, recurrent, moderate: Secondary | ICD-10-CM

## 2017-03-09 LAB — BASIC METABOLIC PANEL
Anion gap: 10 (ref 5–15)
BUN: 39 mg/dL — AB (ref 6–20)
CHLORIDE: 99 mmol/L — AB (ref 101–111)
CO2: 28 mmol/L (ref 22–32)
Calcium: 9 mg/dL (ref 8.9–10.3)
Creatinine, Ser: 1.98 mg/dL — ABNORMAL HIGH (ref 0.61–1.24)
GFR calc Af Amer: 39 mL/min — ABNORMAL LOW (ref >60)
GFR calc non Af Amer: 34 mL/min — ABNORMAL LOW (ref >60)
GLUCOSE: 123 mg/dL — AB (ref 65–99)
POTASSIUM: 3.5 mmol/L (ref 3.5–5.1)
Sodium: 137 mmol/L (ref 135–145)

## 2017-03-09 MED ORDER — TUBERCULIN PPD 5 UNIT/0.1ML ID SOLN
5.0000 [IU] | Freq: Once | INTRADERMAL | Status: AC
  Administered 2017-03-09: 5 [IU] via INTRADERMAL
  Filled 2017-03-09: qty 0.1

## 2017-03-09 MED ORDER — MIRTAZAPINE 15 MG PO TABS
15.0000 mg | ORAL_TABLET | Freq: Every day | ORAL | Status: DC
  Administered 2017-03-09: 15 mg via ORAL
  Filled 2017-03-09: qty 1

## 2017-03-09 NOTE — Clinical Social Work Note (Addendum)
CSW spoke to patient and informed him that CSW is trying to find a group home who can accept him with Medicaid.  CSW informed patient that if CSW is unable to find a place for him he will have to consider going to a homeless shelter.  CSW gave patient a list of resources for [facility] and homeless shelters.  Patient expressed understanding and he is hopeful a facility will be able to accept him.  Patient expressed that his daughter was paying his rent initially, but since she stopped paying patient was evicted.  Patient expressed that his daughter is upset with him because he stayed in the apartment too long and he was aware that he was going to be evicted.  Patient stated that now his daughter is upset because now she has the eviction on her record because it was in her name.  CSW asked if patient had any other family or friends that he can stay with, patient states, his daughter will not let him come stay with her, his sister does not have any space for him to stay there, and his friend who is married, his wife will not let patient stay with them.  Patient discussed how he feels depressed because of his situation, and he does not know what his plan is going to be after he discharges from hospital, if CSW can not find placement for him.  Patient said he has has been to drug rehab in the past and is willing to go again, however he did not state any current use of substance abuse.  CSW continuing to try to find placement.  5:15pm  CSW contacted several family care homes and they do not have any beds available.  CSW spoke to [facility] [phone] from [place] and she is going to check to see if patient would qualify for special assistance Medicaid and also with Social Security Administration to see if patient is receiving any type of disability and then call CSW back on Friday.  CSW was given contact information for Melida Quitter [phone] who is the adminstrator of several  family care homes to see if he has any beds available.  CSW spoke to [place], and he requested patient's clinical information to be faxed so he can review patient's information and then call CSW back on Friday.    CSW also was given contact information from [place] for Alberteen Spindle from [facility] [phone], CSW attempted to contact [place] and left message on voice mail awaiting for call back.  CSW continuing to work on placement options for patient.  Jones Broom. [place], MSW, [place]  03/09/2017 4:19 PM

## 2017-03-09 NOTE — Progress Notes (Signed)
[place] [facility] [place] NAME: Jonathon Rios    MR#:  [id number]  DATE OF BIRTH:  08-09-1950  SUBJECTIVE:  CHIEF COMPLAINT:   Chief Complaint  Patient presents with  . Shortness of Breath    REVIEW OF SYSTEMS:  CONSTITUTIONAL: No fever, fatigue or weakness.  EYES: No blurred or double vision.  EARS, NOSE, AND THROAT: No tinnitus or ear pain.  RESPIRATORY: No cough, shortness of breath, wheezing or hemoptysis.  CARDIOVASCULAR: No chest pain, orthopnea, edema.  GASTROINTESTINAL: No nausea, vomiting, diarrhea or abdominal pain.  GENITOURINARY: No dysuria, hematuria.  ENDOCRINE: No polyuria, nocturia,  HEMATOLOGY: No anemia, easy bruising or bleeding SKIN: No rash or lesion. MUSCULOSKELETAL: No joint pain or arthritis.   NEUROLOGIC: No tingling, numbness, weakness.  PSYCHIATRY: No anxiety or depression.   ROS  DRUG ALLERGIES:   Allergies  Allergen Reactions  . Penicillins Hives    Has patient had a PCN reaction causing immediate rash, facial/tongue/throat swelling, SOB or lightheadedness with hypotension: Yes Has patient had a PCN reaction causing severe rash involving mucus membranes or skin necrosis: No Has patient had a PCN reaction that required hospitalization: No Has patient had a PCN reaction occurring within the last 10 years: No If all of the above answers are "NO", then may proceed with Cephalosporin use.    VITALS:  Blood pressure (!) 132/93, pulse 71, temperature 98 F (36.7 C), temperature source Oral, resp. rate 18, height 6\' 3"  (1.905 m), weight 71.8 kg (158 lb 6.4 oz), SpO2 100 %.  PHYSICAL EXAMINATION:  GENERAL:  67 y.o.-year-old patient lying in the bed with no acute distress.  EYES: Pupils equal, round, reactive to light and accommodation. No scleral icterus. Extraocular muscles intact.  HEENT: Head atraumatic, normocephalic. Oropharynx and nasopharynx clear.  NECK:  Supple, no jugular venous distention. No thyroid  enlargement, no tenderness.  LUNGS: Normal breath sounds bilaterally, no wheezing, rales,rhonchi or crepitation. No use of accessory muscles of respiration.  CARDIOVASCULAR: S1, S2 normal. No murmurs, rubs, or gallops.  ABDOMEN: Soft, nontender, nondistended. Bowel sounds present. No organomegaly or mass.  EXTREMITIES: No pedal edema, cyanosis, or clubbing.  NEUROLOGIC: Cranial nerves II through XII are intact. Muscle strength 5/5 in all extremities. Sensation intact. Gait not checked.  PSYCHIATRIC: The patient is alert and oriented x 3.  SKIN: No obvious rash, lesion, or ulcer.   Physical Exam LABORATORY PANEL:   CBC Recent Labs  Lab 03/07/17 1437  WBC 4.3  HGB 14.2  HCT 42.5  PLT 127*   ------------------------------------------------------------------------------------------------------------------  Chemistries  Recent Labs  Lab 03/09/17 0940  NA 137  K 3.5  CL 99*  CO2 28  GLUCOSE 123*  BUN 39*  CREATININE 1.98*  CALCIUM 9.0   ------------------------------------------------------------------------------------------------------------------  Cardiac Enzymes Recent Labs  Lab 03/07/17 1437  TROPONINI 0.03*   ------------------------------------------------------------------------------------------------------------------  RADIOLOGY:  No results found.  ASSESSMENT AND PLAN:   [name] a29 y.o.malewith a known history of systolic congestive heart failure, CAD, hypertension comes to the emergency room with shortness of breath. Patient reports his symptoms have been getting worse. He has ran out of his Lasix for a while. He is not able to get around to go for follow-up and neither is he able to go to the medication management clinic for picking up his medication.  1.Acute on chronic systolicCHF Resolving Continue IV Lasixbid40mg  bid -EchoIn nov 2018showed EF of 25-30% Patient reports having left heart catheterization done in [facility] in  [place] about a year ago did not require any intervention. He does not remember the results. Continue aspirin, lisinopril, Coreg, cardiology input appreciated-follow-up with congestive heart failure clinics status post discharge  2.Chest pressure Due to above  Resolved  Continue aspirin, lisinopril, Coreg, and as needed nitrates Cardiology input appreciated Cardiac enzymes negative for acute coronary syndrome  3.Accelerated hypertension Resolved on current regiment  4.Nicotine abuse Smoking cessation provided  5. CKD stage III Stable  6.  Acute depression Per nursing staff had thoughts of self-harm We will place on sitter and consult psychiatry for expert opinion  Stable Condition guarded Prognosis fair Disposition to group home when bed is available when cleared from psychiatric standpoint   All the records are reviewed and case discussed with Care Management/Social Workerr. Management plans discussed with the patient, family and they are in agreement.  CODE STATUS: full  TOTAL TIME TAKING CARE OF THIS PATIENT: 35 minutes.     POSSIBLE D/C IN 1-2 DAYS, DEPENDING ON CLINICAL CONDITION.   Avel Peace Azul Coffie M.D on 03/09/2017   Between 7am to 6pm - Pager - [phone]  After 6pm go to [email or web address] - password [email or web address] [facility]  Office  [phone]  CC: Primary care physician; Patient, No Pcp Per  Note: This dictation was prepared with Dragon dictation along with smaller phrase technology. Any transcriptional errors that result from this process are unintentional.

## 2017-03-09 NOTE — NC FL2 (Signed)
[place] LEVEL OF CARE SCREENING TOOL     IDENTIFICATION  Patient Name: Jonathon Rios Birthdate: 08/31/1950 Sex: male Admission Date (Current Location): 03/07/2017  [place] and [place] Number:  [name] [id number] [place] and Address:  [facility], [address], [place], [place] [address]      Provider Number: [id number]  Attending Physician Name and Address:  Gorden Harms, MD  Relative Name and Phone Number:  CAMDEN, KNOTEK  [phone]     Current Level of Care: Hospital Recommended Level of Care: Family Care Home Prior Approval Number:    Date Approved/Denied:   PASRR Number:    Discharge Plan: Domiciliary (Rest home)    Current Diagnoses: Patient Active Problem List   Diagnosis Date Noted  . CHF (congestive heart failure) ([place]) 03/07/2017  . Acute CHF (congestive heart failure) ([place]) 01/02/2017    Orientation RESPIRATION BLADDER Height & Weight     Self, Time, Place, Situation  Normal Continent Weight: 158 lb 6.4 oz (71.8 kg) Height:  6\' 3"  (190.5 cm)  BEHAVIORAL SYMPTOMS/MOOD NEUROLOGICAL BOWEL NUTRITION STATUS      Continent Diet(2G sodium diet)  AMBULATORY STATUS COMMUNICATION OF NEEDS Skin   Independent Verbally Normal                       Personal Care Assistance Level of Assistance  Bathing, Feeding, Dressing Bathing Assistance: Independent Feeding assistance: Independent Dressing Assistance: Independent     Functional Limitations Info  Sight, Hearing, Speech Sight Info: Adequate Hearing Info: Adequate Speech Info: Adequate    SPECIAL CARE FACTORS FREQUENCY                       Contractures Contractures Info: Not present    Additional Factors Info  Code Status, Allergies Code Status Info: Full Code Allergies Info: Penicillins           Current Medications (03/09/2017):  This is the current hospital active medication list Current Facility-Administered  Medications  Medication Dose Route Frequency Provider Last Rate Last Dose  . albuterol (PROVENTIL) (2.5 MG/3ML) 0.083% nebulizer solution 2.5 mg  2.5 mg Nebulization Q6H PRN Fritzi Mandes, MD      . aspirin EC tablet 81 mg  81 mg Oral Daily Fritzi Mandes, MD   81 mg at 03/09/17 0933  . carvedilol (COREG) tablet 3.125 mg  3.125 mg Oral BID WC Fritzi Mandes, MD   3.125 mg at 03/09/17 1611  . enoxaparin (LOVENOX) injection 40 mg  40 mg Subcutaneous Q24H Fritzi Mandes, MD      . furosemide (LASIX) injection 40 mg  40 mg Intravenous Q12H Fritzi Mandes, MD   40 mg at 03/09/17 1611  . lisinopril (PRINIVIL,ZESTRIL) tablet 5 mg  5 mg Oral Daily Fritzi Mandes, MD   5 mg at 03/09/17 0933  . nitroGLYCERIN (NITROSTAT) SL tablet 0.4 mg  0.4 mg Sublingual Q5 min PRN Fritzi Mandes, MD      . ondansetron [facility]) tablet 4 mg  4 mg Oral Q6H PRN Fritzi Mandes, MD       Or  . ondansetron (ZOFRAN) injection 4 mg  4 mg Intravenous Q6H PRN Fritzi Mandes, MD      . polyethylene glycol (MIRALAX / GLYCOLAX) packet 17 g  17 g Oral Daily PRN Fritzi Mandes, MD      . tuberculin injection 5 Units  5 Units Intradermal Once Salary, Avel Peace, MD   5 Units at  03/09/17 1209     Discharge Medications: Please see discharge summary for a list of discharge medications.  Relevant Imaging Results:  Relevant Lab Results:   Additional Information SSN [id number]  Ross Ludwig, [place]
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
## 2017-03-09 NOTE — Clinical Social Work Note (Signed)
CSW spoke to patient, and he expressed that he has suicide thoughts in the past which come and go, and has some depression.  Patient also stated that he has had some mental health care in the past as well when he was living in [place].  CSW notified bedside nurse who contacted physician who ordered patient on suicide prevention measures.  CSW to continue to follow patient's progress throughout discharge planning.  Jones Broom. [place], MSW, [place]  03/09/2017 5:52 PM

## 2017-03-09 NOTE — Progress Notes (Signed)
Reported to RN by Education officer, museum that pt voiced to him thoughts of SI and dealing with depression/ RN to bedside to assess/ pt confirms that he is dealing with depression and he has had thoughts of SI in the past that comes and goes/ DENIES thought of SI currently/ MD made aware/ 1:1 sitter precautions ordered / suicide preventions measure taken to prep room / and Psych consult ordered

## 2017-03-09 NOTE — Care Management (Signed)
Patient now on 1:1 sitter due to suicidal thoughts.  CSW is looking to find a group home for patient.  His daughter was paying his rent and could not continue so patient has been evicted.

## 2017-03-09 NOTE — Progress Notes (Signed)
[facility] Encounter Note  Patient: Jonathon Rios / Admit Date: 03/07/2017 / Date of Encounter: 03/09/2017, 12:52 PM   Subjective: Significant improvements in symptoms overnight.  Less shortness of breath edema and no evidence of cough or congestion.  No evidence of myocardial infarction at this time. Echocardiogram showing severe LV systolic dysfunction with ejection fraction of 25% and minor valvular heart disease  Review of Systems: Positive for: Shortness of breath Negative for: Vision change, hearing change, syncope, dizziness, nausea, vomiting,diarrhea, bloody stool, stomach pain, cough, congestion, diaphoresis, urinary frequency, urinary pain,skin lesions, skin rashes Others previously listed  Objective: Telemetry: Normal sinus rhythm Physical Exam: Blood pressure (!) 127/93, pulse 71, temperature 97.7 F (36.5 C), resp. rate 18, height 6\' 3"  (1.905 m), weight 71.8 kg (158 lb 6.4 oz), SpO2 100 %. Body mass index is 19.8 kg/m. General: Well developed, well nourished, in no acute distress. Head: Normocephalic, atraumatic, sclera non-icteric, no xanthomas, nares are without discharge. Neck: No apparent masses Lungs: Normal respirations with no wheezes, no rhonchi, no rales , few crackles   Heart: Regular rate and rhythm, normal S1 S2, no murmur, no rub, no gallop, PMI is normal size and placement, carotid upstroke normal without bruit, jugular venous pressure normal Abdomen: Soft, non-tender, non-distended with normoactive bowel sounds. No hepatosplenomegaly. Abdominal aorta is normal size without bruit Extremities: No edema, no clubbing, no cyanosis, no ulcers,  Peripheral: 2+ radial, 2+ femoral, 2+ dorsal pedal pulses Neuro: Alert and oriented. Moves all extremities spontaneously. Psych:  Responds to questions appropriately with a normal affect.   Intake/Output Summary (Last 24 hours) at 03/09/2017 1252 Last data filed at 03/09/2017 1230 Gross per 24 hour   Intake 840 ml  Output 3505 ml  Net -2665 ml    Inpatient Medications:  . aspirin EC  81 mg Oral Daily  . carvedilol  3.125 mg Oral BID WC  . enoxaparin (LOVENOX) injection  40 mg Subcutaneous Q24H  . furosemide  40 mg Intravenous Q12H  . lisinopril  5 mg Oral Daily  . tuberculin  5 Units Intradermal Once   Infusions:   Labs: Recent Labs    03/07/17 1437 03/09/17 0940  NA 138 137  K 3.7 3.5  CL 110 99*  CO2 22 28  GLUCOSE 144* 123*  BUN 25* 39*  CREATININE 1.78* 1.98*  CALCIUM 8.5* 9.0   No results for input(s): AST, ALT, ALKPHOS, BILITOT, PROT, ALBUMIN in the last 72 hours. Recent Labs    03/07/17 1437  WBC 4.3  HGB 14.2  HCT 42.5  MCV 89.7  PLT 127*   Recent Labs    03/07/17 1437  TROPONINI 0.03*   Invalid input(s): POCBNP No results for input(s): HGBA1C in the last 72 hours.   Weights: Filed Weights   03/07/17 1439 03/08/17 0531 03/09/17 0308  Weight: 76.7 kg (169 lb) 73.1 kg (161 lb 3.2 oz) 71.8 kg (158 lb 6.4 oz)     Radiology/Studies:  Dg Chest 2 View  Result Date: 03/07/2017 CLINICAL DATA:  Shortness of breath and chest pain EXAM: CHEST  2 VIEW COMPARISON:  01/02/2017 FINDINGS: Cardiac shadow is stable. Tortuosity of thoracic aorta is noted. The lungs are well aerated bilaterally. No focal infiltrate or sizable effusion is seen. IMPRESSION: No active cardiopulmonary disease. Electronically Signed   By: Inez Catalina M.D.   On: 03/07/2017 15:16     Assessment and Recommendation  67 y.o. male with known coronary artery disease essential hypertension mixed hyperlipidemia with acute  on chronic systolic dysfunction congestive heart failure multifactorial including noncompliance of medications 1.  Continue carvedilol lisinopril furosemide for further risk reduction cardiovascular event and congestive heart failure 2.  No further cardiac interventions and/or diagnostics necessary at this time 3.  Patient to have cardiac rehabilitation and some other  supportive care as an outpatient to reduce hospitalization 4.  Okay for discharge home from cardiac standpoint with follow-up next week  Signed, Serafina Royals M.D. FACC

## 2017-03-09 NOTE — Care Management (Signed)
Spoke with medicaid transportation and informed that CM can not assist with initiating medicaid transportation referral.

## 2017-03-10 MED ORDER — FUROSEMIDE 40 MG PO TABS
40.0000 mg | ORAL_TABLET | Freq: Two times a day (BID) | ORAL | Status: DC
  Administered 2017-03-10 – 2017-03-11 (×3): 40 mg via ORAL
  Filled 2017-03-10 (×3): qty 1

## 2017-03-10 MED ORDER — MIRTAZAPINE 15 MG PO TABS
7.5000 mg | ORAL_TABLET | Freq: Every day | ORAL | Status: DC
  Administered 2017-03-10: 7.5 mg via ORAL
  Filled 2017-03-10: qty 1

## 2017-03-10 NOTE — Clinical Social Work Placement (Signed)
   CLINICAL SOCIAL WORK PLACEMENT  NOTE  Date:  03/10/2017  Patient Details  Name: Jonathon Rios MRN: [id number] Date of Birth: [birth year]  Clinical Social Work is seeking post-discharge placement for this patient at the [facility] level of care (*CSW will initial, date and re-position this form in  chart as items are completed):  Yes   Patient/family provided with [place] Work Department's list of facilities offering this level of care within the geographic area requested by the patient (or if unable, by the patient's family).  Yes   Patient/family informed of their freedom to choose among providers that offer the needed level of care, that participate in Medicare, Medicaid or managed care program needed by the patient, have an available bed and are willing to accept the patient.  Yes   Patient/family informed of [place]'s ownership interest in [facility] and [facility], as well as of the fact that they are under no obligation to receive care at these facilities.  PASRR submitted to EDS on       PASRR number received on       Existing PASRR number confirmed on       FL2 transmitted to all facilities in geographic area requested by pt/family on 03/09/17     FL2 transmitted to all facilities within larger geographic area on       Patient informed that his/her managed care company has contracts with or will negotiate with certain facilities, including the following:        Yes   Patient/family informed of bed offers received.  Patient chooses bed at [facility]     Physician recommends and patient chooses bed at      Patient to be transferred to [facility] on 03/11/17.  Patient to be transferred to facility by Facility transportation     Patient family notified on   of transfer.  Name of family member notified:        PHYSICIAN Please sign FL2     Additional Comment:     _______________________________________________ Ross Ludwig, LCSWA 03/10/2017, 5:16 PM

## 2017-03-10 NOTE — Progress Notes (Signed)
Patient is a 67 year old male with known hx of CHF, CAD, HTN.  Patient admitted with acute on chronic CHF.  Last echo in November 2018 revealed EF of 25 - 30%.  Patient is  Currently on the following cardiac medications:  Asa, Lisinopril, Coreg, Lasix and Nitroglycerin SL.  Current smoker.    Patient lying in bed with television on.  Room dark.  Patient with flat affect.    When this nurse attempted to educate / re-educate patient on CHF, patient asked, "Where am I supposed to keep scales?  Put them in a back pack?  I do not even have a place to live.  This is too much for a 67 year old man to be living on the streets.  I cannot work.  I am unable to work as I do not have the breath to work.  I do not understand how people who do not even look sick can get disability when others who cannot breathe or have the breath to work cannot get disability.  This is just too much. Ever since my Mom died last April 01, 2016  - she was my Social worker - I have had no one and no where to go."  This RN allowed patient to continue to ventilate and express his feelings about his current situation.  Emotional support provided by listening.  Patient did state that SW is working on trying to find him a place to live.    Patient not ready to learn at this time, as he is most concerned about where is he going to live.  Patient has no where to live.  SW involved.  Patient will need scales, as he left scales that were provided to him during previous admission at the place where he was renting/living at that time.  Living Better with Heart Failure packet left at bedside.   SW Consult in process.    Roanna Epley, RN, BSN, [facility] Cardiovascular and Pulmonary Nurse Navigator

## 2017-03-10 NOTE — Care Management (Signed)
Patient will discharge 11.12 to [facility].  Address for the actual facility- [address] [place] [address] phone [phone].  Spoke to primary nurse and instructed that the PPD must be read and documented in the medical record prior to family care home staff transport patient to facility.  Referral to [facility] for RN and referral accepted.  Informed [facility] that address for the facility is in CM note.

## 2017-03-10 NOTE — Clinical Social Work Note (Addendum)
CSW received a phone call from Melida Quitter [phone] who reviewed patient's clinicals and he said he will come assess patient today.  CSW updated patient to inform him that family care home owner will come to evaluate him.  2:30pm  CSW met with Madelynn Done, who has agreed to accept patient to [facility] [place], [place] also spoke with DSS worker Elaina Hoops [phone] who said she was trying to contact social security office to find out if patient can start applying for social security early and also if he would be eligible for special assistance Medicaid to help pay for his stay at family care home.  DSS worker did not hear back from social security office, CSW informed her that patient will be discharging to [facility] on Saturday once the TB test has been read CSW updated bedside nurse.  DSS worker said she will contact Madelynn Done once she hears something from social security office.  CSW to continue to follow patient's progress throughout discharge planning.    Please fax discharge summary and updated FL2 to [place] at [phone] once patient is ready for discharge.  Jones Broom. [place], MSW, [place]  03/10/2017 4:59 PM

## 2017-03-10 NOTE — Clinical Social Work Note (Signed)
Clinical Social Work Assessment  Patient Details  Name: Jonathon Rios MRN: [id number] Date of Birth: 1951-01-04  Date of referral:  03/10/17               Reason for consult:  Facility Placement                Permission sought to share information with:  Facility Sport and exercise psychologist, Family Supports Permission granted to share information::  No  Name::        Agency::  Yes  Relationship::     Contact Information:     Housing/Transportation Living arrangements for the past 2 months:  Apartment Source of Information:  Patient Patient Interpreter Needed:  None Criminal Activity/Legal Involvement Pertinent to Current Situation/Hospitalization:  No - Comment as needed Significant Relationships:  Adult Children Lives with:  Self Do you feel safe going back to the place where you live?  No Need for family participation in patient care:  No (Coment)  Care giving concerns:    Facilities manager / plan: CSW received referral for patient needing assistance with trying to find a place to live.  CSW spoke to patient to discuss options of either going to the shelter, going to a group home, or maybe looking at ALF.  Patient states he has been living in an apartment that his daughter was paying for, however, she is unable to continue to pay for it.  Patient states he has spoken to Jonathon Rios in August, about trying to apply for disability, however he has not heard back.  CSW explained to patient that he would have to continue to try to contact DSS.  Patient does have Medicaid, and a small amount of approximately $175 of a retirement plan each month.  Patient gave CSW permission to see if there is anywhere that is able to accept him.  Patient's information was faxed out to different family care homes.  Employment status:  Unemployed Forensic scientist:  Medicaid In [place] PT Recommendations:  Not assessed at this time Information / Referral to community resources:  Shelter  Patient/Family's  Response to care:  Patient agreeable to going to a group home if possible.  Patient/Family's Understanding of and Emotional Response to Diagnosis, Current Treatment, and Prognosis: Patient is depressed and anxious because he does not know where he will be able to discharge to.    Emotional Assessment Appearance:  Appears stated age Attitude/Demeanor/Rapport:    Affect (typically observed):  Anxious, Tearful/Crying, Pleasant Orientation:  Oriented to Self, Oriented to Place, Oriented to  Time, Oriented to Situation Alcohol / Substance use:  Tobacco Use, Illicit Drugs Psych involvement (Current and /or in the community):  No (Comment)  Discharge Needs  Concerns to be addressed:  Homelessness Readmission within the last 30 days:  No Current discharge risk:  Lack of support system, Lives alone Barriers to Discharge:  Homeless with medical needs, Family Issues, Active Substance Use   Ross Ludwig, LCSWA 03/10/2017, 10:20 AM

## 2017-03-10 NOTE — Consult Note (Signed)
[facility] Psychiatry Consult   Reason for Consult: Follow-up consult for 67 year old man with complaints of depression Referring Physician: Salary Patient Identification: Jonathon Rios MRN:  [id number] Principal Diagnosis: Moderate recurrent major depression ([employer]) Diagnosis:   Patient Active Problem List   Diagnosis Date Noted  . 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 PTSD (post-traumatic stress disorder) [F43.10] 03/09/2017    Priority: Medium  . Cocaine abuse ([place]) [F14.10] 03/09/2017    Priority: Medium  . CHF (congestive heart failure) ([place]) [I50.9] 03/07/2017  . Acute CHF (congestive heart failure) ([place]) [I50.9] 01/02/2017    Total Time spent with patient: 30 minutes  Subjective:   Jonathon Rios is a 67 y.o. male patient admitted with "I am feeling much better but that medicine is too strong".  HPI: Follow-up from note yesterday.  Patient was given 15 mg of mirtazapine last night.  He said it was too strong and left him too sleepy during the day.  His mood today however is reported as much better.  Not only has his health improved but apparently progress was made to finding him a group home type living situation so that he does not have to stay in the shelter.  Past Psychiatric History: Past history of depression and possibly PTSD.  Substance abuse.  Risk to Self: Is patient at risk for suicide?: No Risk to Others:   Prior Inpatient Therapy:   Prior Outpatient Therapy:    Past Medical History:  Past Medical History:  Diagnosis Date  . CHF (congestive heart failure) ([place])   . Coronary artery disease   . Hypertension   . Irregular heart beat    History reviewed. No pertinent surgical history. Family History: History reviewed. No pertinent family history. Family Psychiatric  History: None Social History:  Social History   Substance and Sexual Activity  Alcohol Use No  . Frequency: Never     Social History    Substance and Sexual Activity  Drug Use Not on file    Social History   Socioeconomic History  . Marital status: Married    Spouse name: None  . Number of children: None  . Years of education: None  . Highest education level: None  Social Needs  . Financial resource strain: None  . Food insecurity - worry: None  . Food insecurity - inability: None  . Transportation needs - medical: None  . Transportation needs - non-medical: None  Occupational History  . None  Tobacco Use  . Smoking status: Current Some Day Smoker    Packs/day: 1.00    Years: 50.00    Pack years: 50.00    Types: Cigarettes  . Smokeless tobacco: Never Used  Substance and Sexual Activity  . Alcohol use: No    Frequency: Never  . Drug use: None  . Sexual activity: None  Other Topics Concern  . None  Social History Narrative  . None   Additional Social History:    Allergies:   Allergies  Allergen Reactions  . Penicillins Hives    Has patient had a PCN reaction causing immediate rash, facial/tongue/throat swelling, SOB or lightheadedness with hypotension: Yes Has patient had a PCN reaction causing severe rash involving mucus membranes or skin necrosis: No Has patient had a PCN reaction that required hospitalization: No Has patient had a PCN reaction occurring within the last 10 years: No If all of the above answers are "NO", then may proceed with Cephalosporin use.  Labs:  Results for orders placed or performed during the hospital encounter of 03/07/17 (from the past 48 hour(s))  Basic metabolic panel     Status: Abnormal   Collection Time: 03/09/17  9:40 AM  Result Value Ref Range   Sodium 137 135 - 145 mmol/L   Potassium 3.5 3.5 - 5.1 mmol/L   Chloride 99 (L) 101 - 111 mmol/L   CO2 28 22 - 32 mmol/L   Glucose, Bld 123 (H) 65 - 99 mg/dL   BUN 39 (H) 6 - 20 mg/dL   Creatinine, Ser 1.98 (H) 0.61 - 1.24 mg/dL   Calcium 9.0 8.9 - 10.3 mg/dL   GFR calc non Af Amer 34 (L) >60 mL/min   GFR  calc Af Amer 39 (L) >60 mL/min    Comment: (NOTE) The eGFR has been calculated using the CKD EPI equation. This calculation has not been validated in all clinical situations. eGFR's persistently <60 mL/min signify possible Chronic Kidney Disease.    Anion gap 10 5 - 15    Comment: Performed at [facility], [address] [place], [place] [address]    Current Facility-Administered Medications  Medication Dose Route Frequency Provider Last Rate Last Dose  . albuterol (PROVENTIL) (2.5 MG/3ML) 0.083% nebulizer solution 2.5 mg  2.5 mg Nebulization Q6H PRN Fritzi Mandes, MD      . aspirin EC tablet 81 mg  81 mg Oral Daily Fritzi Mandes, MD   81 mg at 03/10/17 0846  . carvedilol (COREG) tablet 3.125 mg  3.125 mg Oral BID WC Fritzi Mandes, MD   3.125 mg at 03/10/17 1659  . enoxaparin (LOVENOX) injection 40 mg  40 mg Subcutaneous Q24H Fritzi Mandes, MD      . furosemide (LASIX) tablet 40 mg  40 mg Oral BID Loney Hering D, MD   40 mg at 03/10/17 1659  . lisinopril (PRINIVIL,ZESTRIL) tablet 5 mg  5 mg Oral Daily Fritzi Mandes, MD   5 mg at 03/10/17 0846  . mirtazapine (REMERON) tablet 7.5 mg  7.5 mg Oral QHS Talyn Dessert T, MD      . nitroGLYCERIN (NITROSTAT) SL tablet 0.4 mg  0.4 mg Sublingual Q5 min PRN Fritzi Mandes, MD      . ondansetron [facility]) tablet 4 mg  4 mg Oral Q6H PRN Fritzi Mandes, MD       Or  . ondansetron (ZOFRAN) injection 4 mg  4 mg Intravenous Q6H PRN Fritzi Mandes, MD      . polyethylene glycol (MIRALAX / GLYCOLAX) packet 17 g  17 g Oral Daily PRN Fritzi Mandes, MD      . tuberculin injection 5 Units  5 Units Intradermal Once Gorden Harms, MD   5 Units at 03/09/17 1209    Musculoskeletal: Strength & Muscle Tone: within normal limits Gait & Station: normal Patient leans: N/A  Psychiatric Specialty Exam: Physical Exam  Nursing note and vitals reviewed. Constitutional: He appears well-developed and well-nourished.  HENT:  Head: Normocephalic and atraumatic.  Eyes:  Conjunctivae are normal. Pupils are equal, round, and reactive to light.  Neck: Normal range of motion.  Cardiovascular: Regular rhythm and normal heart sounds.  Respiratory: Effort normal. No respiratory distress.  GI: Soft.  Musculoskeletal: Normal range of motion.  Neurological: He is alert.  Skin: Skin is warm and dry.  Psychiatric: He has a normal mood and affect. His behavior is normal. Judgment and thought content normal.    Review of Systems  Constitutional: Negative.  HENT: Negative.   Eyes: Negative.   Respiratory: Negative.   Cardiovascular: Negative.   Gastrointestinal: Negative.   Musculoskeletal: Negative.   Skin: Negative.   Neurological: Negative.   Psychiatric/Behavioral: Negative.     Blood pressure (!) 118/92, pulse 78, temperature 97.6 F (36.4 C), temperature source Tympanic, resp. rate 18, height '6\' 3"'  (1.905 m), weight 72 kg (158 lb 11.2 oz), SpO2 98 %.Body mass index is 19.84 kg/m.  General Appearance: Fairly Groomed  Eye Contact:  Fair  Speech:  Clear and Coherent  Volume:  Normal  Mood:  Euthymic  Affect:  Congruent  Thought Process:  Goal Directed  Orientation:  Full (Time, Place, and Person)  Thought Content:  Logical  Suicidal Thoughts:  No  Homicidal Thoughts:  No  Memory:  Immediate;   Good Recent;   Fair Remote;   Fair  Judgement:  Fair  Insight:  Fair  Psychomotor Activity:  Decreased  Concentration:  Concentration: Fair  Recall:  [employer] of Knowledge:  Fair  Language:  Fair  Akathisia:  No  Handed:  Right  AIMS (if indicated):     Assets:  Communication Skills Desire for Improvement  ADL's:  Intact  Cognition:  WNL  Sleep:        Treatment Plan Summary: Medication management and Plan Patient appears to be feeling much better.  I have cut down the mirtazapine dose to 7.5 mg at night per his request.  Supportive counseling no need to change any other medicine or add any other antidepressant.  Patient is likely to be  discharged over the weekend.  Encouraged him to follow-up with outpatient provider.  Disposition: No evidence of imminent risk to self or others at present.   Patient does not meet criteria for psychiatric inpatient admission. Supportive therapy provided about ongoing stressors.  Alethia Berthold, MD 03/10/2017 7:10 PM

## 2017-03-10 NOTE — Progress Notes (Signed)
Jonathon Rios at [place] NAME: Jonathon Rios    MR#:  [id number]  DATE OF BIRTH:  [birth year]  SUBJECTIVE:  CHIEF COMPLAINT:   Chief Complaint  Patient presents with  . Shortness of Breath    REVIEW OF SYSTEMS:  CONSTITUTIONAL: No fever, fatigue or weakness.  EYES: No blurred or double vision.  EARS, NOSE, AND THROAT: No tinnitus or ear pain.  RESPIRATORY: No cough, shortness of breath, wheezing or hemoptysis.  CARDIOVASCULAR: No chest pain, orthopnea, edema.  GASTROINTESTINAL: No nausea, vomiting, diarrhea or abdominal pain.  GENITOURINARY: No dysuria, hematuria.  ENDOCRINE: No polyuria, nocturia,  HEMATOLOGY: No anemia, easy bruising or bleeding SKIN: No rash or lesion. MUSCULOSKELETAL: No joint pain or arthritis.   NEUROLOGIC: No tingling, numbness, weakness.  PSYCHIATRY: No anxiety or depression.   ROS  DRUG ALLERGIES:   Allergies  Allergen Reactions  . Penicillins Hives    Has patient had a PCN reaction causing immediate rash, facial/tongue/throat swelling, SOB or lightheadedness with hypotension: Yes Has patient had a PCN reaction causing severe rash involving mucus membranes or skin necrosis: No Has patient had a PCN reaction that required hospitalization: No Has patient had a PCN reaction occurring within the last 10 years: No If all of the above answers are "NO", then may proceed with Cephalosporin use.    VITALS:  Blood pressure 120/83, pulse 64, temperature 97.7 F (36.5 C), temperature source Oral, resp. rate 18, height 6\' 3"  (1.905 m), weight 72 kg (158 lb 11.2 oz), SpO2 97 %.  PHYSICAL EXAMINATION:  GENERAL:  67 y.o.-year-old patient lying in the bed with no acute distress.  EYES: Pupils equal, round, reactive to light and accommodation. No scleral icterus. Extraocular muscles intact.  HEENT: Head atraumatic, normocephalic. Oropharynx and nasopharynx clear.  NECK:  Supple, no jugular venous distention. No thyroid  enlargement, no tenderness.  LUNGS: Normal breath sounds bilaterally, no wheezing, rales,rhonchi or crepitation. No use of accessory muscles of respiration.  CARDIOVASCULAR: S1, S2 normal. No murmurs, rubs, or gallops.  ABDOMEN: Soft, nontender, nondistended. Bowel sounds present. No organomegaly or mass.  EXTREMITIES: No pedal edema, cyanosis, or clubbing.  NEUROLOGIC: Cranial nerves II through XII are intact. Muscle strength 5/5 in all extremities. Sensation intact. Gait not checked.  PSYCHIATRIC: The patient is alert and oriented x 3.  SKIN: No obvious rash, lesion, or ulcer.   Physical Exam LABORATORY PANEL:   CBC Recent Labs  Lab 03/07/17 1437  WBC 4.3  HGB 14.2  HCT 42.5  PLT 127*   ------------------------------------------------------------------------------------------------------------------  Chemistries  Recent Labs  Lab 03/09/17 0940  NA 137  K 3.5  CL 99*  CO2 28  GLUCOSE 123*  BUN 39*  CREATININE 1.98*  CALCIUM 9.0   ------------------------------------------------------------------------------------------------------------------  Cardiac Enzymes Recent Labs  Lab 03/07/17 1437  TROPONINI 0.03*   ------------------------------------------------------------------------------------------------------------------  RADIOLOGY:  No results found.  ASSESSMENT AND PLAN:  Jonathon Rios a67 y.o.malewith a known history of systolic congestive heart failure, CAD, hypertension comes to the emergency room with shortness of breath. Patient reports his symptoms have been getting worse. He has ran out of his Lasix for a while. He is not able to get around to go for follow-up and neither is he able to go to the medication management clinic for picking up his medication.  1.Acute on chronic systolicCHF Resolved Treated on congestive heart failure protocol, change Lasix to by mouth, cardiology input greatly appreciated  -EchoIn nov 2018showed EF of  25-30% Patient reports  having left heart catheterization done in [facility] in [place] about a year ago did not require any intervention. He does not remember the results. Continueaspirin,lisinopril, Coreg, clear discharge per cardiology, we'll refer to heart failure clinic status post discharge for continued care  2.Chest pressure Due to above  Resolved  Continue aspirin, lisinopril, Coreg, and as needed nitrates Cardiology did see patient while in house assisted above Cardiac enzymes negative for acute coronary syndrome  3.Accelerated hypertension Resolved on current regiment  4.Nicotine abuse Smoking cessation provided  5. CKD stage III Stable  6.  Acute depression Patient evaluated by psychiatry-that is greatly appreciated, started on Remeron at bedtime No suicidal ideation noted  7. Acute homelessness Case management 67 with disposition to group home tomorrow  Stable Condition guarded Prognosis fair Disposition to group home  on tomorrow barring any complications   All the records are reviewed and case discussed with Care Management/Social Workerr. Management plans discussed with the patient, family and they are in agreement.  CODE STATUS: full   TOTAL TIME TAKING CARE OF THIS PATIENT: 45 minutes.     POSSIBLE D/C IN 1-2 DAYS, DEPENDING ON CLINICAL CONDITION.   Jonathon Rios Salary M.D on 03/10/2017   Between 7am to 6pm - Pager - [phone]  After 6pm go to [email or web address] - password [email or web address] [facility]  Office  [phone]  CC: Primary care physician; Patient, No Pcp Per  Note: This dictation was prepared with Dragon dictation along with smaller phrase technology. Any transcriptional errors that result from this process are unintentional.

## 2017-03-11 ENCOUNTER — Inpatient Hospital Stay: Payer: Medicaid Other

## 2017-03-11 MED ORDER — FUROSEMIDE 40 MG PO TABS: 40.0000 mg | ORAL_TABLET | Freq: Two times a day (BID) | ORAL | 0 refills | Status: DC

## 2017-03-11 MED ORDER — MIRTAZAPINE 7.5 MG PO TABS: 7.5000 mg | ORAL_TABLET | Freq: Every day | ORAL | 0 refills | Status: DC

## 2017-03-11 MED ORDER — ALBUTEROL SULFATE HFA 108 (90 BASE) MCG/ACT IN AERS: 2.0000 | INHALATION_SPRAY | Freq: Four times a day (QID) | RESPIRATORY_TRACT | 2 refills | Status: DC | PRN

## 2017-03-11 NOTE — Discharge Summary (Signed)
[facility] NAME: Jonathon Rios    MR#:  [id number]  DATE OF BIRTH:  [birth year]  DATE OF ADMISSION:  03/07/2017 ADMITTING PHYSICIAN: Fritzi Mandes, MD  DATE OF DISCHARGE: No discharge date for patient encounter.  PRIMARY CARE PHYSICIAN: Patient, No Pcp Per    ADMISSION DIAGNOSIS:  Dyspnea on exertion [R06.09] Noncompliance [Z91.19] Acute on chronic congestive heart failure, unspecified heart failure type ([place]) [I50.9]  DISCHARGE DIAGNOSIS:  Principal Problem:   Moderate recurrent major depression (HCC) Active Problems:   CHF (congestive heart failure) (HCC)   PTSD (post-traumatic stress disorder)   Cocaine abuse ([place])   SECONDARY DIAGNOSIS:   Past Medical History:  Diagnosis Date  . CHF (congestive heart failure) ([place])   . Coronary artery disease   . Hypertension   . Irregular heart beat     HOSPITAL COURSE:   [name], CAD, hypertension comes to the emergency room with shortness of breath. Patient reports his symptoms have been getting worse. He has ran out of his Lasix for a while. He is not able to get around to go for follow-up and neither is he able to go to the medication management clinic for picking up his medication.  1.Acute on chronic systolicCHF Resolved Treated on our CHF protocol, given Lasix IV which was transitioned to p.o. prior to discharge, cardiology to see patient while in house, provide aspirin, lisinopril, Coreg, to follow-up heart failure clinic status post discharge for continued care/management -EchoIn nov 2018showed EF of 25-30% Patient reports having left heart catheterization done in [facility] in [place] about a year ago did not require any intervention. He does not remember the results.  2.Chest pressure Due toabove  Resolved  Continueaspirin, lisinopril, Coreg, and as needed  nitrates Cardiology did see patient while in house assisted above Cardiac enzymes negative for acute coronary syndrome  3.Accelerated hypertension Resolved on current regiment  4.Nicotine abuse Smoking cessation provided  5.CKD stage III Stable  6.Acute depression Patient evaluated by psychiatry- started on Remeron at bedtime, and will follow-up as an outpatient No suicidal ideation noted  7. Acute homelessness Case management to see patient while in house- diisposition to group home discharge     DISCHARGE CONDITIONS:   On day of discharge patient is afebrile, stable, tolerating diet, ready for discharge to home with follow-up with congestive heart failure clinic as stated above, follow-up with psychiatry for reevaluation of depression, and follow-up with primary care provider in 1-2 weeks for all other modalities   CONSULTS OBTAINED:  Treatment Team:  Corey Skains, MD Clapacs, Madie Reno, MD  DRUG ALLERGIES:   Allergies  Allergen Reactions  . Penicillins Hives    Has patient had a PCN reaction causing immediate rash, facial/tongue/throat swelling, SOB or lightheadedness with hypotension: Yes Has patient had a PCN reaction causing severe rash involving mucus membranes or skin necrosis: No Has patient had a PCN reaction that required hospitalization: No Has patient had a PCN reaction occurring within the last 10 years: No If all of the above answers are "NO", then may proceed with Cephalosporin use.    DISCHARGE MEDICATIONS:   Allergies as of 03/11/2017      Reactions   Penicillins Hives   Has patient had a PCN reaction causing immediate rash, facial/tongue/throat swelling, SOB or lightheadedness with hypotension: Yes Has patient had a PCN reaction causing severe rash involving mucus membranes or skin  necrosis: No Has patient had a PCN reaction that required hospitalization: No Has patient had a PCN reaction occurring within the last 10 years: No If all  of the above answers are "NO", then may proceed with Cephalosporin use.      Medication List    TAKE these medications   albuterol 108 (90 Base) MCG/ACT inhaler Commonly known as:  PROVENTIL HFA;VENTOLIN HFA Inhale 2 puffs into the lungs every 6 (six) hours as needed for wheezing or shortness of breath.   aspirin 81 MG tablet Take 1 tablet (81 mg total) daily by mouth.   carvedilol 3.125 MG tablet Commonly known as:  COREG Take 1 tablet (3.125 mg total) 2 (two) times daily with a meal by mouth.   feeding supplement (ENSURE ENLIVE) Liqd Take 237 mLs 3 (three) times daily between meals by mouth.   furosemide 40 MG tablet Commonly known as:  LASIX Take 1 tablet (40 mg total) by mouth 2 (two) times daily. What changed:    medication strength  how much to take  when to take this   ibuprofen 200 MG tablet Commonly known as:  ADVIL,MOTRIN Take 200-400 mg by mouth every 6 (six) hours as needed.   lisinopril 5 MG tablet Commonly known as:  PRINIVIL,ZESTRIL Take 1 tablet (5 mg total) daily by mouth.   mirtazapine 7.5 MG tablet Commonly known as:  REMERON Take 1 tablet (7.5 mg total) by mouth at bedtime.   multivitamin with minerals Tabs tablet Take 1 tablet daily by mouth.   nitroGLYCERIN 0.4 MG SL tablet Commonly known as:  NITROSTAT Place 1 tablet (0.4 mg total) every 5 (five) minutes as needed under the tongue for chest pain.        DISCHARGE INSTRUCTIONS:    If you experience worsening of your admission symptoms, develop shortness of breath, life threatening emergency, suicidal or homicidal thoughts you must seek medical attention immediately by calling 911 or calling your MD immediately  if symptoms less severe.  You Must read complete instructions/literature along with all the possible adverse reactions/side effects for all the Medicines you take and that have been prescribed to you. Take any new Medicines after you have completely understood and accept all the  possible adverse reactions/side effects.   Please note  You were cared for by a hospitalist during your hospital stay. If you have any questions about your discharge medications or the care you received while you were in the hospital after you are discharged, you can call the unit and asked to speak with the hospitalist on call if the hospitalist that took care of you is not available. Once you are discharged, your primary care physician will handle any further medical issues. Please note that NO REFILLS for any discharge medications will be authorized once you are discharged, as it is imperative that you return to your primary care physician (or establish a relationship with a primary care physician if you do not have one) for your aftercare needs so that they can reassess your need for medications and monitor your lab values.    Today   CHIEF COMPLAINT:   Chief Complaint  Patient presents with  . Shortness of Breath    HISTORY OF PRESENT ILLNESS:  Jonathon Rios  is a 67 y.o. male with a known history of systolic congestive heart failure, CAD, hypertension comes to the emergency room with shortness of breath.  Patient reports his symptoms have been getting worse.  He has ran out of his Lasix  for a while.  He is not able to get around to go for follow-up and neither is he able to go to the medication management clinic for picking up his medication.  He was found to have BNP of 1600.  Patient is being admitted with acute on chronic systolic congestive heart failure secondary to medication and dietary noncompliance.    VITAL SIGNS:  Blood pressure 122/89, pulse 69, temperature 97.8 F (36.6 C), temperature source Oral, resp. rate 18, height 6\' 3"  (1.905 m), weight 72.9 kg (160 lb 12.8 oz), SpO2 98 %.  I/O:    Intake/Output Summary (Last 24 hours) at 03/11/2017 1127 Last data filed at 03/11/2017 1035 Gross per 24 hour  Intake 840 ml  Output 1500 ml  Net -660 ml    PHYSICAL  EXAMINATION:  GENERAL:  67 y.o.-year-old patient lying in the bed with no acute distress.  EYES: Pupils equal, round, reactive to light and accommodation. No scleral icterus. Extraocular muscles intact.  HEENT: Head atraumatic, normocephalic. Oropharynx and nasopharynx clear.  NECK:  Supple, no jugular venous distention. No thyroid enlargement, no tenderness.  LUNGS: Normal breath sounds bilaterally, no wheezing, rales,rhonchi or crepitation. No use of accessory muscles of respiration.  CARDIOVASCULAR: S1, S2 normal. No murmurs, rubs, or gallops.  ABDOMEN: Soft, non-tender, non-distended. Bowel sounds present. No organomegaly or mass.  EXTREMITIES: No pedal edema, cyanosis, or clubbing.  NEUROLOGIC: Cranial nerves II through XII are intact. Muscle strength 5/5 in all extremities. Sensation intact. Gait not checked.  PSYCHIATRIC: The patient is alert and oriented x 3.  SKIN: No obvious rash, lesion, or ulcer.   DATA REVIEW:   CBC Recent Labs  Lab 03/07/17 1437  WBC 4.3  HGB 14.2  HCT 42.5  PLT 127*    Chemistries  Recent Labs  Lab 03/09/17 0940  NA 137  K 3.5  CL 99*  CO2 28  GLUCOSE 123*  BUN 39*  CREATININE 1.98*  CALCIUM 9.0    Cardiac Enzymes Recent Labs  Lab 03/07/17 1437  TROPONINI 0.03*    Microbiology Results  No results found for this or any previous visit.  RADIOLOGY:  No results found.  EKG:   Orders placed or performed during the hospital encounter of 03/07/17  . EKG 12-Lead  . EKG 12-Lead  . ED EKG within 10 minutes  . ED EKG within 10 minutes  . EKG      Management plans discussed with the patient, family and they are in agreement.  CODE STATUS:     Code Status Orders  (From admission, onward)        Start     Ordered   03/07/17 1800  Full code  Continuous     03/07/17 1759    Code Status History    Date Active Date Inactive Code Status Order ID Comments User Context   01/02/2017 20:57 01/05/2017 15:53 Full Code [id number]   Dustin Flock, MD ED      TOTAL TIME TAKING CARE OF THIS PATIENT: 45 minutes.    Avel Peace Salary M.D on 03/11/2017 at 11:27 AM  Between 7am to 6pm - Pager - [phone]  After 6pm go to [email or web address] - password [email or web address] [facility]  Office  [phone]  CC: Primary care physician; Patient, No Pcp Per   Note: This dictation was prepared with Dragon dictation along with smaller phrase technology. Any transcriptional errors that result from this process are unintentional.

## 2017-03-11 NOTE — NC FL2 (Signed)
[place] LEVEL OF CARE SCREENING TOOL     IDENTIFICATION  Patient Name: Jonathon Rios Birthdate: December 17, 1950 Sex: male Admission Date (Current Location): 03/07/2017  [place] and [place] Number:  [name] [phone] [place] and Address:  [facility], [address], [place], [place] [address]      Provider Number: [id number]  Attending Physician Name and Address:  Gorden Harms, MD  Relative Name and Phone Number:  QUINCE, SANTANA  [phone]     Current Level of Care: Hospital Recommended Level of Care: Family Care Home Prior Approval Number:    Date Approved/Denied:   PASRR Number:    Discharge Plan: Domiciliary (Rest home)    Current Diagnoses: Patient Active Problem List   Diagnosis Date Noted  . Moderate recurrent major depression ([place]) 03/09/2017  . PTSD (post-traumatic stress disorder) 03/09/2017  . Cocaine abuse ([place]) 03/09/2017  . CHF (congestive heart failure) ([place]) 03/07/2017  . Acute CHF (congestive heart failure) ([place]) 01/02/2017    Orientation RESPIRATION BLADDER Height & Weight     Self, Time, Place, Situation  Normal Continent Weight: 160 lb 12.8 oz (72.9 kg) Height:  6\' 3"  (190.5 cm)  BEHAVIORAL SYMPTOMS/MOOD NEUROLOGICAL BOWEL NUTRITION STATUS      Continent Diet(2G sodium diet)  AMBULATORY STATUS COMMUNICATION OF NEEDS Skin   Independent Verbally Normal                       Personal Care Assistance Level of Assistance  Bathing, Feeding, Dressing Bathing Assistance: Independent Feeding assistance: Independent Dressing Assistance: Independent     Functional Limitations Info  Sight, Hearing, Speech Sight Info: Adequate Hearing Info: Adequate Speech Info: Adequate    SPECIAL CARE FACTORS FREQUENCY                       Contractures Contractures Info: Not present    Additional Factors Info  Code Status, Allergies Code Status Info: Full Code Allergies Info:  Penicillins           Current Medications (03/11/2017):  This is the current hospital active medication list Current Facility-Administered Medications  Medication Dose Route Frequency Provider Last Rate Last Dose  . albuterol (PROVENTIL) (2.5 MG/3ML) 0.083% nebulizer solution 2.5 mg  2.5 mg Nebulization Q6H PRN Fritzi Mandes, MD      . aspirin EC tablet 81 mg  81 mg Oral Daily Fritzi Mandes, MD   81 mg at 03/11/17 0814  . carvedilol (COREG) tablet 3.125 mg  3.125 mg Oral BID WC Fritzi Mandes, MD   3.125 mg at 03/11/17 0815  . enoxaparin (LOVENOX) injection 40 mg  40 mg Subcutaneous Q24H Fritzi Mandes, MD      . furosemide (LASIX) tablet 40 mg  40 mg Oral BID Loney Hering D, MD   40 mg at 03/11/17 0815  . lisinopril (PRINIVIL,ZESTRIL) tablet 5 mg  5 mg Oral Daily Fritzi Mandes, MD   5 mg at 03/11/17 0815  . mirtazapine (REMERON) tablet 7.5 mg  7.5 mg Oral QHS Clapacs, John T, MD   7.5 mg at 03/10/17 2225  . nitroGLYCERIN (NITROSTAT) SL tablet 0.4 mg  0.4 mg Sublingual Q5 min PRN Fritzi Mandes, MD      . ondansetron [facility]) tablet 4 mg  4 mg Oral Q6H PRN Fritzi Mandes, MD       Or  . ondansetron [facility]) injection 4 mg  4 mg Intravenous Q6H PRN Fritzi Mandes, MD      .  polyethylene glycol (MIRALAX / GLYCOLAX) packet 17 g  17 g Oral Daily PRN Fritzi Mandes, MD      . tuberculin injection 5 Units  5 Units Intradermal Once Salary, Holly Bodily D, MD   5 Units at 03/09/17 1209     Discharge Medications: Medication List     TAKE these medications   albuterol 108 (90 Base) MCG/ACT inhaler Commonly known as:  PROVENTIL HFA;VENTOLIN HFA Inhale 2 puffs into the lungs every 6 (six) hours as needed for wheezing or shortness of breath.   aspirin 81 MG tablet Take 1 tablet (81 mg total) daily by mouth.   carvedilol 3.125 MG tablet Commonly known as:  COREG Take 1 tablet (3.125 mg total) 2 (two) times daily with a meal by mouth.   feeding supplement (ENSURE ENLIVE) Liqd Take 237 mLs 3 (three) times daily  between meals by mouth.   furosemide 40 MG tablet Commonly known as:  LASIX Take 1 tablet (40 mg total) by mouth 2 (two) times daily. What changed:    medication strength  how much to take  when to take this   ibuprofen 200 MG tablet Commonly known as:  ADVIL,MOTRIN Take 200-400 mg by mouth every 6 (six) hours as needed.   lisinopril 5 MG tablet Commonly known as:  PRINIVIL,ZESTRIL Take 1 tablet (5 mg total) daily by mouth.   mirtazapine 7.5 MG tablet Commonly known as:  REMERON Take 1 tablet (7.5 mg total) by mouth at bedtime.   multivitamin with minerals Tabs tablet Take 1 tablet daily by mouth.   nitroGLYCERIN 0.4 MG SL tablet Commonly known as:  NITROSTAT Place 1 tablet (0.4 mg total) every 5 (five) minutes as needed under the tongue for chest pain.     Relevant Imaging Results:  Relevant Lab Results:   Additional Information SSN [id number]  Zettie Pho, LCSW

## 2017-03-11 NOTE — Clinical Social Work Note (Signed)
The patient will discharge to [facility] via facility transport. The CSW has updated Melida Quitter of the positive PPD with negative chest X-Ray. The CSW will include the negative X-Ray indication in the discharge packet along with the FL 2 and discharge summary once available. CSW is signing off once the packet has been delivered to the chart. Please consult should additional needs arise.  Santiago Bumpers, MSW, Latanya Presser [phone]

## 2017-03-11 NOTE — Care Management Note (Addendum)
Case Management Note  Patient Details  Name: Jonathon Rios MRN: [id number] Date of Birth: [birth year]  Subjective/Objective:    PPD reading has been read, is negative, and is in the chart prior to discharge per weekend SWer. A referral for HH=RN was called to Melene Muller at [facility].                 Action/Plan:   Expected Discharge Date:  03/11/17               Expected Discharge Plan:     In-House Referral:     Discharge planning Services     Post Acute Care Choice:    Choice offered to:     DME Arranged:    DME Agency:     HH Arranged:    HH Agency:     Status of Service:     If discussed at [employer], dates discussed:    Additional Comments:  Jame Morrell A, RN 03/11/2017, 12:59 PM

## 2017-03-11 NOTE — Progress Notes (Signed)
IV and tele removed from patient. Discharge instructions given to patient and group home leader.

## 2017-03-11 NOTE — Progress Notes (Signed)
Patient Tb test reads positive, MD notified, patient stated that he took treatment for 6 months in the past, CXR ordered.

## 2017-03-15 NOTE — Progress Notes (Signed)
Patient ID: LOGON UTTECH, male    DOB: June 24, 1950, 67 y.o.   MRN: [id number]  HPI  [name] is a 67 y/o male with a history of CAD, HTN, recent tobacco use and chronic heart failure.   Echo report from 01/03/17 reviewed and showed an EF of 25-30%.  Admitted 03/07/17 due to acute on chronic HF. Cardiology consult obtained. Initially needed IV diuretics and then transitioned to oral diuretics. Psychiatry consulted due to depression. Discharged after 4 days to a group home. Admitted 01/02/17 due to HF exacerbation. Cardiology consult obtained. IV diuretics given initially and then transitioned to oral diuretics. Discharged after 3 days.  He presents today for his initial visit with a chief complaint of minimal shortness of breath upon moderate exertion. He says this has been chronic in nature having been present for several months with varying levels of severity. He has associated fatigue, difficulty sleeping and intermittent chest pain. He denies any edema, palpitations, abdominal distention, dizziness or anxiety.   Past Medical History:  Diagnosis Date  . CHF (congestive heart failure) ([place])   . Coronary artery disease   . Hypertension   . Irregular heart beat    Past Surgical History:  Procedure Laterality Date  . CARDIAC CATHETERIZATION    . TONSILLECTOMY     Family History  Problem Relation Age of Onset  . Hypertension Mother   . Diabetes Mother   . Hypertension Sister    Social History   Socioeconomic History  . Marital status: Married    Spouse name: None  . Number of children: 4  . Years of education: 25  . Highest education level: High school graduate  Social Needs  . Financial resource strain: Not hard at all  . Food insecurity - worry: Sometimes true  . Food insecurity - inability: Sometimes true  . Transportation needs - medical: Yes  . Transportation needs - non-medical: Yes  Occupational History  . None  Tobacco Use  . Smoking status: Former Smoker   Packs/day: 1.00    Years: 50.00    Pack years: 50.00    Types: Cigarettes    Last attempt to quit: 02/23/2017    Years since quitting: 0.0  . Smokeless tobacco: Never Used  Substance and Sexual Activity  . Alcohol use: No    Frequency: Never  . Drug use: Yes    Types: "Crack" cocaine, Heroin, Marijuana, LSD    Comment: 1/17 couple months clean now  . Sexual activity: No    Birth control/protection: Condom  Other Topics Concern  . None  Social History Narrative  . None   Allergies  Allergen Reactions  . Penicillins Hives    Has patient had a PCN reaction causing immediate rash, facial/tongue/throat swelling, SOB or lightheadedness with hypotension: Yes Has patient had a PCN reaction causing severe rash involving mucus membranes or skin necrosis: No Has patient had a PCN reaction that required hospitalization: No Has patient had a PCN reaction occurring within the last 10 years: No If all of the above answers are "NO", then may proceed with Cephalosporin use.    Prior to Admission medications   Medication Sig Start Date End Date Taking? Authorizing Provider  aspirin 81 MG tablet Take 1 tablet (81 mg total) daily by mouth. 01/05/17  Yes Fritzi Mandes, MD  carvedilol (COREG) 3.125 MG tablet Take 1 tablet (3.125 mg total) 2 (two) times daily with a meal by mouth. 01/04/17  Yes Fritzi Mandes, MD  feeding supplement, ENSURE  ENLIVE, (ENSURE ENLIVE) LIQD Take 237 mLs 3 (three) times daily between meals by mouth. 01/04/17  Yes Fritzi Mandes, MD  furosemide (LASIX) 40 MG tablet Take 1 tablet (40 mg total) by mouth 2 (two) times daily. 03/11/17  Yes Salary, Avel Peace, MD  ibuprofen (ADVIL,MOTRIN) 200 MG tablet Take 200-400 mg by mouth every 6 (six) hours as needed.   Yes [provider]  lisinopril (PRINIVIL,ZESTRIL) 5 MG tablet Take 1 tablet (5 mg total) daily by mouth. 01/05/17  Yes Fritzi Mandes, MD  mirtazapine (REMERON) 7.5 MG tablet Take 1 tablet (7.5 mg total) by mouth at bedtime.  03/11/17  Yes Salary, Avel Peace, MD  Multiple Vitamin (MULTIVITAMIN WITH MINERALS) TABS tablet Take 1 tablet daily by mouth. 01/05/17  Yes Fritzi Mandes, MD  nitroGLYCERIN (NITROSTAT) 0.4 MG SL tablet Place 1 tablet (0.4 mg total) every 5 (five) minutes as needed under the tongue for chest pain. 01/04/17  Yes Fritzi Mandes, MD  albuterol (PROVENTIL HFA;VENTOLIN HFA) 108 (90 Base) MCG/ACT inhaler Inhale 2 puffs into the lungs every 6 (six) hours as needed for wheezing or shortness of breath. Patient not taking: Reported on 03/16/2017 03/11/17   Salary, Avel Peace, MD    Review of Systems  Constitutional: Positive for fatigue. Negative for appetite change.  HENT: Negative for congestion, postnasal drip and sore throat.   Eyes: Negative.   Respiratory: Positive for shortness of breath. Negative for chest tightness.   Cardiovascular: Positive for chest pain (intermittently). Negative for palpitations and leg swelling.  Gastrointestinal: Negative for abdominal distention and abdominal pain.  Endocrine: Negative.   Genitourinary: Negative.   Musculoskeletal: Negative for back pain and neck pain.  Skin: Negative.   Allergic/Immunologic: Negative.   Neurological: Negative for dizziness and light-headedness.  Hematological: Negative for adenopathy. Does not bruise/bleed easily.  Psychiatric/Behavioral: Positive for sleep disturbance. Negative for dysphoric mood. The patient is not nervous/anxious.     Vitals:   03/16/17 0933  BP: 112/82  Pulse: 69  Resp: 18  SpO2: 100%  Weight: 167 lb (75.8 kg)  Height: 6\' 3"  (1.905 m)   Wt Readings from Last 3 Encounters:  03/16/17 167 lb (75.8 kg)  03/11/17 160 lb 12.8 oz (72.9 kg)  01/05/17 167 lb 8 oz (76 kg)   Lab Results  Component Value Date   CREATININE 1.98 (H) 03/09/2017   CREATININE 1.78 (H) 03/07/2017   CREATININE 1.91 (H) 01/03/2017    Physical Exam  Constitutional: He is oriented to person, place, and time. He appears well-developed and  well-nourished.  HENT:  Head: Normocephalic and atraumatic.  Neck: Normal range of motion. Neck supple. No JVD present.  Cardiovascular: Normal rate and regular rhythm.  Pulmonary/Chest: Effort normal. He has no wheezes. He has no rales.  Abdominal: Soft. He exhibits no distension. There is no tenderness.  Musculoskeletal: He exhibits no edema or tenderness.  Neurological: He is alert and oriented to person, place, and time.  Skin: Skin is warm and dry.  Psychiatric: He has a normal mood and affect. His behavior is normal. Thought content normal.  Nursing note and vitals reviewed.  Assessment & Plan:  1: Chronic heart failure with reduced ejection fraction- - NYHA class II - euvolemic today - not being weighed daily at the group home and an order was written for him to be weighed daily and to call for an overnight weight gain of >2 pounds or a weekly weight gain of >5 pounds - not adding salt to his food and  caregiver with him says the group home doesn't cook with salt. Discussed keeping sodium intake to 2000mg  daily and written dietary information was given to him about this - will d/c lisinopril and begin entresto 24/26mg  twice daily. Advised caregiver to skip one day in between taking the last dose of lisinopril and begin taking entresto twice daily - BNP 03/07/17 was 1609.0 - will get a BMP at his next visit - PharmD went in and reconciled medications  2: HTN- - BP looks good today - BMP from 03/09/17 reviewed and showed sodium 137, potassium 3.5 and GFR 39  3: Tobacco use- - recently quit smoking 02/23/17  Facility medication list was reviewed.  Return in 1 month or sooner for any questions/problems before then.

## 2017-03-16 ENCOUNTER — Ambulatory Visit: Payer: Medicaid Other | Attending: Family | Admitting: Family

## 2017-03-16 ENCOUNTER — Encounter: Payer: Self-pay | Admitting: Family

## 2017-03-16 VITALS — BP 112/82 | HR 69 | Resp 18 | Ht 75.0 in | Wt 167.0 lb

## 2017-03-16 DIAGNOSIS — Z79899 Other long term (current) drug therapy: Secondary | ICD-10-CM | POA: Insufficient documentation

## 2017-03-16 DIAGNOSIS — R0602 Shortness of breath: Secondary | ICD-10-CM | POA: Diagnosis present

## 2017-03-16 DIAGNOSIS — I110 Hypertensive heart disease with heart failure: Secondary | ICD-10-CM | POA: Diagnosis not present

## 2017-03-16 DIAGNOSIS — I10 Essential (primary) hypertension: Secondary | ICD-10-CM | POA: Insufficient documentation

## 2017-03-16 DIAGNOSIS — I5022 Chronic systolic (congestive) heart failure: Secondary | ICD-10-CM | POA: Diagnosis not present

## 2017-03-16 DIAGNOSIS — Z7982 Long term (current) use of aspirin: Secondary | ICD-10-CM | POA: Diagnosis not present

## 2017-03-16 DIAGNOSIS — Z880 Allergy status to penicillin: Secondary | ICD-10-CM | POA: Diagnosis not present

## 2017-03-16 DIAGNOSIS — I2510 Atherosclerotic heart disease of native coronary artery without angina pectoris: Secondary | ICD-10-CM | POA: Insufficient documentation

## 2017-03-16 DIAGNOSIS — Z720 Tobacco use: Secondary | ICD-10-CM | POA: Insufficient documentation

## 2017-03-16 DIAGNOSIS — Z87891 Personal history of nicotine dependence: Secondary | ICD-10-CM | POA: Diagnosis not present

## 2017-03-16 MED ORDER — SACUBITRIL-VALSARTAN 24-26 MG PO TABS
1.0000 | ORAL_TABLET | Freq: Two times a day (BID) | ORAL | 3 refills | Status: DC
Start: 2017-03-16 — End: 2017-06-05

## 2017-03-16 NOTE — Patient Instructions (Addendum)
Begin weighing daily and call for an overnight weight gain of > 2 pounds or a weekly weight gain of >5 pounds.  Will stop the lisinopril and begin entresto. When you pick up the entresto prescription, you will stop taking the lisinopril, wait 1 day and then begin the enresto twice daily

## 2017-04-12 NOTE — Progress Notes (Signed)
Patient ID: Jonathon Rios, male    DOB: October 01, 1950, 67 y.o.   MRN: [id number]  HPI  Jonathon Rios is a 67 y/o male with a history of CAD, HTN, recent tobacco use and chronic heart failure.   Echo report from 01/03/17 reviewed and showed an EF of 25-30%.  Admitted 03/07/17 due to acute on chronic HF. Cardiology consult obtained. Initially needed IV diuretics and then transitioned to oral diuretics. Psychiatry consulted due to depression. Discharged after 4 days to a group home. Admitted 01/02/17 due to HF exacerbation. Cardiology consult obtained. IV diuretics given initially and then transitioned to oral diuretics. Discharged after 3 days.  He presents today for a follow-up visit with a chief complaint of minimal fatigue upon moderate exertion. He says this has been present for several years but is slowly improving. He has associated shortness of breath, intermittent chest pain, difficulty sleeping and gradual weight gain. He denies any edema, palpitations, abdominal distention or dizziness. Does complain of having to get up often during the night due to urination from diuretic.   Past Medical History:  Diagnosis Date  . CHF (congestive heart failure) ([place])   . Coronary artery disease   . Hypertension   . Irregular heart beat    Past Surgical History:  Procedure Laterality Date  . CARDIAC CATHETERIZATION    . TONSILLECTOMY     Family History  Problem Relation Age of Onset  . Hypertension Mother   . Diabetes Mother   . Hypertension Sister    Social History   Socioeconomic History  . Marital status: Married    Spouse name: Not on file  . Number of children: 4  . Years of education: 19  . Highest education level: High school graduate  Social Needs  . Financial resource strain: Not hard at all  . Food insecurity - worry: Sometimes true  . Food insecurity - inability: Sometimes true  . Transportation needs - medical: Yes  . Transportation needs - non-medical: Yes  Occupational  History  . Not on file  Tobacco Use  . Smoking status: Former Smoker    Packs/day: 1.00    Years: 50.00    Pack years: 50.00    Types: Cigarettes    Last attempt to quit: 02/23/2017    Years since quitting: 0.1  . Smokeless tobacco: Never Used  Substance and Sexual Activity  . Alcohol use: No    Frequency: Never  . Drug use: Yes    Types: "Crack" cocaine, Heroin, Marijuana, LSD    Comment: 1/17 couple months clean now  . Sexual activity: No    Birth control/protection: Condom  Other Topics Concern  . Not on file  Social History Narrative  . Not on file   Allergies  Allergen Reactions  . Penicillins Hives    Has patient had a PCN reaction causing immediate rash, facial/tongue/throat swelling, SOB or lightheadedness with hypotension: Yes Has patient had a PCN reaction causing severe rash involving mucus membranes or skin necrosis: No Has patient had a PCN reaction that required hospitalization: No Has patient had a PCN reaction occurring within the last 10 years: No If all of the above answers are "NO", then may proceed with Cephalosporin use.   Prior to Admission medications   Medication Sig Start Date End Date Taking? Authorizing Provider  aspirin 81 MG tablet Take 1 tablet (81 mg total) daily by mouth. 01/05/17  Yes Fritzi Mandes, MD  carvedilol (COREG) 3.125 MG tablet Take 1 tablet (3.125  mg total) 2 (two) times daily with a meal by mouth. 01/04/17  Yes Fritzi Mandes, MD  feeding supplement, ENSURE ENLIVE, (ENSURE ENLIVE) LIQD Take 237 mLs 3 (three) times daily between meals by mouth. Patient taking differently: Take 237 mLs by mouth every evening.  01/04/17  Yes Fritzi Mandes, MD  furosemide (LASIX) 40 MG tablet Take 1 tablet (40 mg total) by mouth 2 (two) times daily. 03/11/17  Yes Salary, Avel Peace, MD  ibuprofen (ADVIL,MOTRIN) 200 MG tablet Take 200-400 mg by mouth every 6 (six) hours as needed.   Yes [provider]  mirtazapine (REMERON) 7.5 MG tablet Take 1 tablet  (7.5 mg total) by mouth at bedtime. 03/11/17  Yes Salary, Avel Peace, MD  Multiple Vitamin (MULTIVITAMIN WITH MINERALS) TABS tablet Take 1 tablet daily by mouth. 01/05/17  Yes Fritzi Mandes, MD  nitroGLYCERIN (NITROSTAT) 0.4 MG SL tablet Place 1 tablet (0.4 mg total) every 5 (five) minutes as needed under the tongue for chest pain. 01/04/17  Yes Fritzi Mandes, MD  sacubitril-valsartan (ENTRESTO) 24-26 MG Take 1 tablet by mouth 2 (two) times daily. 03/16/17  Yes Felton Buczynski, Otila Kluver A, FNP  albuterol (PROVENTIL HFA;VENTOLIN HFA) 108 (90 Base) MCG/ACT inhaler Inhale 2 puffs into the lungs every 6 (six) hours as needed for wheezing or shortness of breath. Patient not taking: Reported on 03/16/2017 03/11/17   Salary, Avel Peace, MD    Review of Systems  Constitutional: Positive for fatigue. Negative for appetite change.  HENT: Negative for congestion, postnasal drip and sore throat.   Eyes: Negative.   Respiratory: Positive for shortness of breath. Negative for chest tightness.   Cardiovascular: Positive for chest pain (intermittently). Negative for palpitations and leg swelling.  Gastrointestinal: Negative for abdominal distention and abdominal pain.  Endocrine: Negative.   Genitourinary: Negative.   Musculoskeletal: Negative for back pain and neck pain.  Skin: Negative.   Allergic/Immunologic: Negative.   Neurological: Negative for dizziness and light-headedness.  Hematological: Negative for adenopathy. Does not bruise/bleed easily.  Psychiatric/Behavioral: Positive for sleep disturbance. Negative for dysphoric mood and suicidal ideas. The patient is not nervous/anxious.    Vitals:   04/13/17 0959  BP: 105/81  Pulse: 63  Resp: 18  SpO2: 100%  Weight: 174 lb (78.9 kg)  Height: 6\' 3"  (1.905 m)   Wt Readings from Last 3 Encounters:  04/13/17 174 lb (78.9 kg)  03/16/17 167 lb (75.8 kg)  03/11/17 160 lb 12.8 oz (72.9 kg)   Lab Results  Component Value Date   CREATININE 1.62 (H) 04/13/2017   CREATININE  1.98 (H) 03/09/2017   CREATININE 1.78 (H) 03/07/2017    Physical Exam  Constitutional: He is oriented to person, place, and time. He appears well-developed and well-nourished.  HENT:  Head: Normocephalic and atraumatic.  Neck: Normal range of motion. Neck supple. No JVD present.  Cardiovascular: Normal rate and regular rhythm.  Pulmonary/Chest: Effort normal. He has no wheezes. He has no rales.  Abdominal: Soft. He exhibits no distension. There is no tenderness.  Musculoskeletal: He exhibits no edema or tenderness.  Neurological: He is alert and oriented to person, place, and time.  Skin: Skin is warm and dry.  Psychiatric: He has a normal mood and affect. His behavior is normal. Thought content normal.  Nursing note and vitals reviewed.  Assessment & Plan:  1: Chronic heart failure with reduced ejection fraction- - NYHA class II - euvolemic today - weighing daily and his caregiver was reminded to call for an overnight weight gain  of >2 pounds or a weekly weight gain of >5 pounds - weight up 7 pounds since 03/16/17 and caregiver says that patient is eating much better - not adding salt to his food and caregiver with him says the group home doesn't cook with salt.  - will decrease his diuretic to 40mg  AM only; can take an additional 40mg  if needed for above weight gain or for edema - BNP 03/07/17 was 1609.0 - consider increasing entresto if BP comes up - slightly bradycardic today so will not titrate up carvedilol - will get a BMP today - PharmD went in and reconciled medications  2: HTN- - BP on the low side; decreasing diuretic per above - BMP from 03/09/17 reviewed and showed sodium 137, potassium 3.5 and GFR 39  3: Tobacco use- - quit smoking 02/23/17  4: PTSD- - feels like his depression is worse right now - denies suicidal thoughts - is supposed to be getting an appointment scheduled with psychiatry  Facility medication list was reviewed.  Return in 2 months or sooner  for any questions/problems before then.

## 2017-04-13 ENCOUNTER — Ambulatory Visit: Payer: Medicaid Other | Attending: Family | Admitting: Family

## 2017-04-13 ENCOUNTER — Encounter: Payer: Self-pay | Admitting: Family

## 2017-04-13 VITALS — BP 105/81 | HR 63 | Resp 18 | Ht 75.0 in | Wt 174.0 lb

## 2017-04-13 DIAGNOSIS — I5022 Chronic systolic (congestive) heart failure: Secondary | ICD-10-CM

## 2017-04-13 DIAGNOSIS — I110 Hypertensive heart disease with heart failure: Secondary | ICD-10-CM | POA: Insufficient documentation

## 2017-04-13 DIAGNOSIS — Z880 Allergy status to penicillin: Secondary | ICD-10-CM | POA: Insufficient documentation

## 2017-04-13 DIAGNOSIS — Z87891 Personal history of nicotine dependence: Secondary | ICD-10-CM | POA: Diagnosis not present

## 2017-04-13 DIAGNOSIS — Z720 Tobacco use: Secondary | ICD-10-CM

## 2017-04-13 DIAGNOSIS — Z79899 Other long term (current) drug therapy: Secondary | ICD-10-CM | POA: Diagnosis not present

## 2017-04-13 DIAGNOSIS — I2510 Atherosclerotic heart disease of native coronary artery without angina pectoris: Secondary | ICD-10-CM | POA: Diagnosis not present

## 2017-04-13 DIAGNOSIS — F4310 Post-traumatic stress disorder, unspecified: Secondary | ICD-10-CM | POA: Insufficient documentation

## 2017-04-13 DIAGNOSIS — I10 Essential (primary) hypertension: Secondary | ICD-10-CM

## 2017-04-13 DIAGNOSIS — Z7982 Long term (current) use of aspirin: Secondary | ICD-10-CM | POA: Diagnosis not present

## 2017-04-13 LAB — BASIC METABOLIC PANEL
Anion gap: 10 (ref 5–15)
BUN: 32 mg/dL — AB (ref 6–20)
CO2: 29 mmol/L (ref 22–32)
Calcium: 9.8 mg/dL (ref 8.9–10.3)
Chloride: 102 mmol/L (ref 101–111)
Creatinine, Ser: 1.62 mg/dL — ABNORMAL HIGH (ref 0.61–1.24)
GFR, EST AFRICAN AMERICAN: 50 mL/min — AB (ref >60)
GFR, EST NON AFRICAN AMERICAN: 43 mL/min — AB (ref >60)
Glucose, Bld: 71 mg/dL (ref 65–99)
POTASSIUM: 4.3 mmol/L (ref 3.5–5.1)
SODIUM: 141 mmol/L (ref 135–145)

## 2017-04-13 NOTE — Patient Instructions (Addendum)
Continue weighing daily and call for an overnight weight gain of > 2 pounds or a weekly weight gain of >5 pounds.  [facility]  [address]  [place] [address] [facility]: [phone]  Evaluations WALK IN ONLY  Monday, Wednesday, Friday    8:30 am -3 pm

## 2017-04-28 ENCOUNTER — Other Ambulatory Visit: Payer: Self-pay

## 2017-06-05 ENCOUNTER — Other Ambulatory Visit: Payer: Self-pay | Admitting: Family

## 2017-06-09 NOTE — Progress Notes (Deleted)
Patient ID: Jonathon Rios, male    DOB: [birth year], 67 y.o.   MRN: [id number]  HPI  Mr Candelaria is a 67 y/o male with a history of CAD, HTN, recent tobacco use and chronic heart failure.   Echo report from 01/03/17 reviewed and showed an EF of 25-30%.  Admitted 03/07/17 due to acute on chronic HF. Cardiology consult obtained. Initially needed IV diuretics and then transitioned to oral diuretics. Psychiatry consulted due to depression. Discharged after 4 days to a group home. Admitted 01/02/17 due to HF exacerbation. Cardiology consult obtained. IV diuretics given initially and then transitioned to oral diuretics. Discharged after 3 days.  He presents today for a follow-up visit with a chief complaint of    Past Medical History:  Diagnosis Date  . CHF (congestive heart failure) ([place])   . Coronary artery disease   . Hypertension   . Irregular heart beat    Past Surgical History:  Procedure Laterality Date  . CARDIAC CATHETERIZATION    . TONSILLECTOMY     Family History  Problem Relation Age of Onset  . Hypertension Mother   . Diabetes Mother   . Hypertension Sister    Social History   Socioeconomic History  . Marital status: Married    Spouse name: Not on file  . Number of children: 4  . Years of education: 81  . Highest education level: High school graduate  Occupational History  . Not on file  Social Needs  . Financial resource strain: Hard  . Food insecurity:    Worry: Sometimes true    Inability: Sometimes true  . Transportation needs:    Medical: Yes    Non-medical: Yes  Tobacco Use  . Smoking status: Former Smoker    Packs/day: 1.00    Years: 50.00    Pack years: 50.00    Types: Cigarettes    Last attempt to quit: 02/23/2017    Years since quitting: 0.2  . Smokeless tobacco: Never Used  Substance and Sexual Activity  . Alcohol use: No    Frequency: Never  . Drug use: Yes    Types: "Crack" cocaine, Heroin, Marijuana, LSD    Comment: 1/17 couple months  clean now  . Sexual activity: Never    Birth control/protection: Condom  Lifestyle  . Physical activity:    Days per week: 7 days    Minutes per session: 30 min  . Stress: Only a little  Relationships  . Social connections:    Talks on phone: Twice a week    Gets together: Never    Attends religious service: Never    Active member of club or organization: No    Attends meetings of clubs or organizations: Never    Relationship status: Separated  Other Topics Concern  . Not on file  Social History Narrative  . Not on file   Allergies  Allergen Reactions  . Penicillins Hives    Has patient had a PCN reaction causing immediate rash, facial/tongue/throat swelling, SOB or lightheadedness with hypotension: Yes Has patient had a PCN reaction causing severe rash involving mucus membranes or skin necrosis: No Has patient had a PCN reaction that required hospitalization: No Has patient had a PCN reaction occurring within the last 10 years: No If all of the above answers are "NO", then may proceed with Cephalosporin use.     Review of Systems  Constitutional: Positive for fatigue. Negative for appetite change.  HENT: Negative for congestion, postnasal drip and sore  throat.   Eyes: Negative.   Respiratory: Positive for shortness of breath. Negative for chest tightness.   Cardiovascular: Positive for chest pain (intermittently). Negative for palpitations and leg swelling.  Gastrointestinal: Negative for abdominal distention and abdominal pain.  Endocrine: Negative.   Genitourinary: Negative.   Musculoskeletal: Negative for back pain and neck pain.  Skin: Negative.   Allergic/Immunologic: Negative.   Neurological: Negative for dizziness and light-headedness.  Hematological: Negative for adenopathy. Does not bruise/bleed easily.  Psychiatric/Behavioral: Positive for sleep disturbance. Negative for dysphoric mood and suicidal ideas. The patient is not nervous/anxious.      Physical  Exam  Constitutional: He is oriented to person, place, and time. He appears well-developed and well-nourished.  HENT:  Head: Normocephalic and atraumatic.  Neck: Normal range of motion. Neck supple. No JVD present.  Cardiovascular: Normal rate and regular rhythm.  Pulmonary/Chest: Effort normal. He has no wheezes. He has no rales.  Abdominal: Soft. He exhibits no distension. There is no tenderness.  Musculoskeletal: He exhibits no edema or tenderness.  Neurological: He is alert and oriented to person, place, and time.  Skin: Skin is warm and dry.  Psychiatric: He has a normal mood and affect. His behavior is normal. Thought content normal.  Nursing note and vitals reviewed.  Assessment & Plan:  1: Chronic heart failure with reduced ejection fraction- - NYHA class II - euvolemic today - weighing daily and his caregiver was reminded to call for an overnight weight gain of >2 pounds or a weekly weight gain of >5 pounds - weight up 7 pounds since 03/16/17 and caregiver says that patient is eating much better - not adding salt to his food and caregiver with him says the group home doesn't cook with salt.  - will decrease his diuretic to 40mg  AM only; can take an additional 40mg  if needed for above weight gain or for edema - BNP 03/07/17 was 1609.0 - consider increasing entresto if BP comes up - slightly bradycardic today so will not titrate up carvedilol -  - PharmD went in and reconciled medications  2: HTN- - BP on the low side; decreasing diuretic per above - BMP from 04/13/17 reviewed and showed sodium 141, potassium 4.3 and GFR 50  3: Tobacco use- - quit smoking 02/23/17  4: PTSD- - feels like his depression is worse right now - denies suicidal thoughts - is supposed to be getting an appointment scheduled with psychiatry  Facility medication list was reviewed.

## 2017-06-12 ENCOUNTER — Ambulatory Visit: Payer: Medicaid - Out of State | Admitting: Family

## 2017-06-12 ENCOUNTER — Telehealth: Payer: Self-pay | Admitting: Family

## 2017-06-12 NOTE — Telephone Encounter (Signed)
Patient did not show for his [facility] Failure [facility] appointment on 06/12/17. Will attempt to reschedule.

## 2017-06-15 ENCOUNTER — Ambulatory Visit: Payer: Medicaid - Out of State | Admitting: Family

## 2017-08-18 ENCOUNTER — Encounter: Payer: Self-pay | Admitting: Cardiology

## 2017-08-18 ENCOUNTER — Ambulatory Visit (INDEPENDENT_AMBULATORY_CARE_PROVIDER_SITE_OTHER): Payer: Medicaid Other | Admitting: Cardiology

## 2017-08-18 VITALS — BP 146/92 | HR 70 | Ht 75.0 in | Wt 180.0 lb

## 2017-08-18 DIAGNOSIS — I428 Other cardiomyopathies: Secondary | ICD-10-CM

## 2017-08-18 DIAGNOSIS — Z720 Tobacco use: Secondary | ICD-10-CM | POA: Diagnosis not present

## 2017-08-18 DIAGNOSIS — F1410 Cocaine abuse, uncomplicated: Secondary | ICD-10-CM

## 2017-08-18 DIAGNOSIS — I10 Essential (primary) hypertension: Secondary | ICD-10-CM | POA: Diagnosis not present

## 2017-08-18 DIAGNOSIS — I5020 Unspecified systolic (congestive) heart failure: Secondary | ICD-10-CM

## 2017-08-18 MED ORDER — LISINOPRIL-HYDROCHLOROTHIAZIDE 10-12.5 MG PO TABS
1.0000 | ORAL_TABLET | Freq: Every day | ORAL | 6 refills | Status: DC
Start: 2017-08-18 — End: 2017-09-01

## 2017-08-18 MED ORDER — CARVEDILOL 3.125 MG PO TABS: 3.1250 mg | ORAL_TABLET | Freq: Two times a day (BID) | ORAL | 6 refills | Status: DC

## 2017-08-18 NOTE — Progress Notes (Signed)
Cardiology Office Note:    Date:  08/18/2017   ID:  Jonathon Rios, DOB [birth year], MRN [id number]  PCP:  Patient, No Pcp Per  Cardiologist:  Jenean Lindau, MD   Referring MD: Lorayne Bender, FNP    ASSESSMENT:    1. Other cardiomyopathy ([place])   2. Essential hypertension   3. Systolic congestive heart failure, unspecified HF chronicity ([place])   4. Cocaine abuse ([place])   5. Tobacco use    PLAN:    In order of problems listed above:  1. Discussed my findings with the patient at extensive length.  Congestive heart failure education was given to the patient at length.  Diet was discussed.  He needs to weigh himself on a regular basis though I do not think he has the resources for it at this time.  He says he lives in a halfway house. 2. He is to restart his medications essentially his blood pressure is elevated and have initiated him on a beta-blocker, lisinopril hydrochlorothiazide and will do blood work his baseline today.  Will be back getting him back in a week for a pulse blood pressure check and a Chem-7 and I will see him back in a month or earlier if he has any concerns.  He knows to go to the nearest emergency room for any significant concerns. 3. He was counseled against drug abuse.  I spent 5 minutes with the patient discussing solely about smoking. Smoking cessation was counseled. I suggested to the patient also different medications and pharmacological interventions. Patient is keen to try stopping on its own at this time. He will get back to me if he needs any further assistance in this matter.   Medication Adjustments/Labs and Tests Ordered: Current medicines are reviewed at length with the patient today.  Concerns regarding medicines are outlined above.  Orders Placed This Encounter  Procedures  . Basic Metabolic Panel (BMET)  . Basic Metabolic Panel (BMET)  . CBC  . TSH  . Hepatic function panel  . EKG 12-Lead   Meds ordered this encounter  Medications  .  carvedilol (COREG) 3.125 MG tablet    Sig: Take 1 tablet (3.125 mg total) by mouth 2 (two) times daily with a meal.    Dispense:  60 tablet    Refill:  6  . lisinopril-hydrochlorothiazide (PRINZIDE,ZESTORETIC) 10-12.5 MG tablet    Sig: Take 1 tablet by mouth daily.    Dispense:  30 tablet    Refill:  6     History of Present Illness:    Jonathon Rios is a 67 y.o. male who is being seen today for the evaluation of congestive heart failure and advanced cardiomyopathy at the request of Lorayne Bender, FNP.  He has past medical history of extensive drug abuse and smokes a pack a day.  He tells me that he is quit his drug abuse and is in a halfway house in the past several weeks.  He denies any chest pain orthopnea or PND.  He has advanced cardiomyopathy with ejection fraction in the 20s.  He tells me that he does not take any medications and wants to be reinitiated on medications.  At the time of my evaluation he is alert awake oriented and in no distress.  He denies any chest pain.  At the time of my evaluation, the patient is alert awake oriented and in no distress.  Past Medical History:  Diagnosis Date  . CHF (congestive heart failure) ([place])   .  Coronary artery disease   . Hypertension   . Irregular heart beat     Past Surgical History:  Procedure Laterality Date  . CARDIAC CATHETERIZATION    . TONSILLECTOMY      Current Medications: Current Meds  Medication Sig  . carvedilol (COREG) 3.125 MG tablet Take 1 tablet (3.125 mg total) by mouth 2 (two) times daily with a meal.  . [DISCONTINUED] albuterol (PROVENTIL HFA;VENTOLIN HFA) 108 (90 Base) MCG/ACT inhaler Inhale 2 puffs into the lungs every 6 (six) hours as needed for wheezing or shortness of breath.  . [DISCONTINUED] carvedilol (COREG) 3.125 MG tablet Take 1 tablet (3.125 mg total) 2 (two) times daily with a meal by mouth.  . [DISCONTINUED] ENTRESTO 24-26 MG TAKE 1 TABLET BY MOUTH 2 TIMES A DAY FORBLOOD PRESSURE      Allergies:   Penicillins   Social History   Socioeconomic History  . Marital status: Married    Spouse name: Not on file  . Number of children: 4  . Years of education: 44  . Highest education level: High school graduate  Occupational History  . Not on file  Social Needs  . Financial resource strain: Hard  . Food insecurity:    Worry: Sometimes true    Inability: Sometimes true  . Transportation needs:    Medical: Yes    Non-medical: Yes  Tobacco Use  . Smoking status: Former Smoker    Packs/day: 1.00    Years: 50.00    Pack years: 50.00    Types: Cigarettes    Last attempt to quit: 02/23/2017    Years since quitting: 0.4  . Smokeless tobacco: Never Used  Substance and Sexual Activity  . Alcohol use: No    Frequency: Never  . Drug use: Yes    Types: "Crack" cocaine, Heroin, Marijuana, LSD    Comment: 1/17 couple months clean now  . Sexual activity: Never    Birth control/protection: Condom  Lifestyle  . Physical activity:    Days per week: 7 days    Minutes per session: 30 min  . Stress: Only a little  Relationships  . Social connections:    Talks on phone: Twice a week    Gets together: Never    Attends religious service: Never    Active member of club or organization: No    Attends meetings of clubs or organizations: Never    Relationship status: Separated  Other Topics Concern  . Not on file  Social History Narrative  . Not on file     Family History: The patient's family history includes Diabetes in his mother; Hypertension in his mother and sister.  ROS:   Please see the history of present illness.    All other systems reviewed and are negative.  EKGs/Labs/Other Studies Reviewed:    The following studies were reviewed today: Reviewed records that were available from the patient.  EKG reveals sinus rhythm and T wave changes which are global T wave inversions.   Recent Labs: 03/07/2017: B Natriuretic Peptide 1,609.0; Hemoglobin 14.2;  Platelets 127 04/13/2017: BUN 32; Creatinine, Ser 1.62; Potassium 4.3; Sodium 141  Recent Lipid Panel No results found for: CHOL, TRIG, HDL, CHOLHDL, VLDL, LDLCALC, LDLDIRECT  Physical Exam:    VS:  BP (!) 146/92 (BP Location: Right Arm, Patient Position: Sitting, Cuff Size: Normal)   Pulse 70   Ht 6\' 3"  (1.905 m)   Wt 180 lb (81.6 kg)   SpO2 98%   BMI 22.50 kg/m  Wt Readings from Last 3 Encounters:  08/18/17 180 lb (81.6 kg)  04/13/17 174 lb (78.9 kg)  03/16/17 167 lb (75.8 kg)     GEN: Patient is in no acute distress HEENT: Normal NECK: No JVD; No carotid bruits LYMPHATICS: No lymphadenopathy CARDIAC: S1 S2 regular, 2/6 systolic murmur at the apex. RESPIRATORY:  Clear to auscultation without rales, wheezing or rhonchi  ABDOMEN: Soft, non-tender, non-distended MUSCULOSKELETAL:  No edema; No deformity  SKIN: Warm and dry NEUROLOGIC:  Alert and oriented x 3 PSYCHIATRIC:  Normal affect    Signed, Jenean Lindau, MD  08/18/2017 5:53 PM    [place]

## 2017-08-18 NOTE — Patient Instructions (Addendum)
Medication Instructions:  Your physician has recommended you make the following change in your medication:  START carvedilol 3.125 mg twice daily START lisinopril-hydrochlorothiazide 10-12.5 mg daily START aspirin 81 mg daily: samples provided  Labwork: Your physician recommends that you return for lab work today: BMP, CBC, TSH, LFT.   Testing/Procedures: You had an EKG today.   Follow-Up: Your physician recommends that you schedule a follow-up appointment in: 2-3 weeks. Dr. Geraldo Pitter wants you to follow up for a nurse visit in 1 week for a pulse, blood pressure check, and lab work.   If you need a refill on your cardiac medications before your next appointment, please call your pharmacy.   Thank you for choosing [facility]! Robyne Peers, RN [phone]

## 2017-08-19 LAB — CBC
HEMOGLOBIN: 15.4 g/dL (ref 13.0–17.7)
Hematocrit: 44.3 % (ref 37.5–51.0)
MCH: 31 pg (ref 26.6–33.0)
MCHC: 34.8 g/dL (ref 31.5–35.7)
MCV: 89 fL (ref 79–97)
Platelets: 136 10*3/uL — ABNORMAL LOW (ref 150–450)
RBC: 4.97 x10E6/uL (ref 4.14–5.80)
RDW: 13.7 % (ref 12.3–15.4)
WBC: 5.5 10*3/uL (ref 3.4–10.8)

## 2017-08-19 LAB — BASIC METABOLIC PANEL
BUN/Creatinine Ratio: 14 (ref 10–24)
BUN: 21 mg/dL (ref 8–27)
CALCIUM: 9.1 mg/dL (ref 8.6–10.2)
CO2: 27 mmol/L (ref 20–29)
Chloride: 103 mmol/L (ref 96–106)
Creatinine, Ser: 1.51 mg/dL — ABNORMAL HIGH (ref 0.76–1.27)
GFR calc Af Amer: 56 mL/min/{1.73_m2} — ABNORMAL LOW (ref >59)
GFR, EST NON AFRICAN AMERICAN: 48 mL/min/{1.73_m2} — AB (ref >59)
Glucose: 82 mg/dL (ref 65–99)
POTASSIUM: 4.6 mmol/L (ref 3.5–5.2)
Sodium: 143 mmol/L (ref 134–144)

## 2017-08-19 LAB — TSH: TSH: 0.982 u[IU]/mL (ref 0.450–4.500)

## 2017-08-19 LAB — HEPATIC FUNCTION PANEL
ALT: 30 IU/L (ref 0–44)
AST: 22 IU/L (ref 0–40)
Albumin: 4 g/dL (ref 3.6–4.8)
Alkaline Phosphatase: 74 IU/L (ref 39–117)
BILIRUBIN TOTAL: 0.5 mg/dL (ref 0.0–1.2)
BILIRUBIN, DIRECT: 0.19 mg/dL (ref 0.00–0.40)
TOTAL PROTEIN: 8 g/dL (ref 6.0–8.5)

## 2017-08-21 ENCOUNTER — Telehealth: Payer: Self-pay

## 2017-08-21 NOTE — Telephone Encounter (Signed)
Called patient and left detailed voice message on phone regarding results. 

## 2017-08-24 ENCOUNTER — Ambulatory Visit: Payer: Medicaid Other

## 2017-08-25 ENCOUNTER — Other Ambulatory Visit: Payer: Self-pay | Admitting: Cardiology

## 2017-08-25 ENCOUNTER — Other Ambulatory Visit: Payer: Self-pay

## 2017-08-25 ENCOUNTER — Ambulatory Visit: Payer: Medicaid Other

## 2017-08-25 MED ORDER — NICOTINE 14 MG/24HR TD PT24: 14.0000 mg | MEDICATED_PATCH | Freq: Every day | TRANSDERMAL | 4 refills | Status: DC

## 2017-08-26 LAB — BASIC METABOLIC PANEL WITH GFR
BUN/Creatinine Ratio: 16 (ref 10–24)
BUN: 23 mg/dL (ref 8–27)
CO2: 26 mmol/L (ref 20–29)
Calcium: 9.5 mg/dL (ref 8.6–10.2)
Chloride: 102 mmol/L (ref 96–106)
Creatinine, Ser: 1.46 mg/dL — ABNORMAL HIGH (ref 0.76–1.27)
GFR calc Af Amer: 58 mL/min/1.73 — ABNORMAL LOW
GFR calc non Af Amer: 50 mL/min/1.73 — ABNORMAL LOW
Glucose: 76 mg/dL (ref 65–99)
Potassium: 4 mmol/L (ref 3.5–5.2)
Sodium: 141 mmol/L (ref 134–144)

## 2017-08-28 ENCOUNTER — Ambulatory Visit: Payer: Medicaid Other

## 2017-09-01 ENCOUNTER — Telehealth: Payer: Self-pay | Admitting: *Deleted

## 2017-09-01 ENCOUNTER — Ambulatory Visit (INDEPENDENT_AMBULATORY_CARE_PROVIDER_SITE_OTHER): Payer: Medicaid Other | Admitting: Cardiology

## 2017-09-01 ENCOUNTER — Other Ambulatory Visit: Payer: Self-pay | Admitting: Cardiology

## 2017-09-01 DIAGNOSIS — I10 Essential (primary) hypertension: Secondary | ICD-10-CM | POA: Diagnosis not present

## 2017-09-01 MED ORDER — LISINOPRIL-HYDROCHLOROTHIAZIDE 20-12.5 MG PO TABS: 1.0000 | ORAL_TABLET | Freq: Every day | ORAL | 0 refills | Status: DC

## 2017-09-01 MED ORDER — NICOTINE 14 MG/24HR TD PT24: 14.0000 mg | MEDICATED_PATCH | Freq: Every day | TRANSDERMAL | 4 refills | Status: DC

## 2017-09-01 NOTE — Telephone Encounter (Signed)
Left message to return call regarding lab results.

## 2017-09-01 NOTE — Progress Notes (Signed)
Jonathon Rios is in today for a 1 week nurse visit to check blood pressure, pulse and BMP. He was started on Carvedilol 3.125 mg 1 tablet twice daily and Lisinopril-HCTZ 10-12.5 mg 1 tablet daily. Blood pressure and pulse results were reviewed with Dr. Geraldo Pitter. The patient was instructed to increase his Lisinopril-HCTZ to 20-12.5 mg 1 time daily. He was instructed to keep his follow up appointmen.

## 2017-09-01 NOTE — Patient Instructions (Addendum)
Medication Instructions:  Your physician has recommended you make the following change in your medication:  Increase your Lisinopril-HCTZ to 20-12.5 1 tablet daily  Labwork: We will be doing lab work at your next appointment.  Testing/Procedures: None ordered  Follow-Up: Your physician recommends that you schedule a follow-up appointment in: keep you follow up on 09/08/17   Any Other Special Instructions Will Be Listed Below (If Applicable).     If you need a refill on your cardiac medications before your next appointment, please call your pharmacy.

## 2017-09-02 LAB — BASIC METABOLIC PANEL
BUN / CREAT RATIO: 20 (ref 10–24)
BUN: 31 mg/dL — AB (ref 8–27)
CHLORIDE: 106 mmol/L (ref 96–106)
CO2: 23 mmol/L (ref 20–29)
CREATININE: 1.56 mg/dL — AB (ref 0.76–1.27)
Calcium: 9.5 mg/dL (ref 8.6–10.2)
GFR calc non Af Amer: 46 mL/min/{1.73_m2} — ABNORMAL LOW (ref >59)
GFR, EST AFRICAN AMERICAN: 53 mL/min/{1.73_m2} — AB (ref >59)
GLUCOSE: 83 mg/dL (ref 65–99)
Potassium: 3.6 mmol/L (ref 3.5–5.2)
SODIUM: 141 mmol/L (ref 134–144)

## 2017-09-08 ENCOUNTER — Ambulatory Visit: Payer: Medicaid Other | Admitting: Cardiology

## 2017-09-11 ENCOUNTER — Other Ambulatory Visit: Payer: Self-pay | Admitting: *Deleted

## 2017-09-11 ENCOUNTER — Encounter: Payer: Self-pay | Admitting: Cardiology

## 2017-09-11 DIAGNOSIS — I428 Other cardiomyopathies: Secondary | ICD-10-CM

## 2017-09-12 LAB — BASIC METABOLIC PANEL
BUN/Creatinine Ratio: 19 (ref 10–24)
BUN: 27 mg/dL (ref 8–27)
CALCIUM: 9.7 mg/dL (ref 8.6–10.2)
CO2: 28 mmol/L (ref 20–29)
CREATININE: 1.42 mg/dL — AB (ref 0.76–1.27)
Chloride: 100 mmol/L (ref 96–106)
GFR calc Af Amer: 60 mL/min/{1.73_m2} (ref >59)
GFR, EST NON AFRICAN AMERICAN: 52 mL/min/{1.73_m2} — AB (ref >59)
Glucose: 78 mg/dL (ref 65–99)
POTASSIUM: 4.3 mmol/L (ref 3.5–5.2)
Sodium: 142 mmol/L (ref 134–144)

## 2017-10-02 ENCOUNTER — Ambulatory Visit: Payer: Medicaid Other | Admitting: Cardiology

## 2017-10-05 ENCOUNTER — Telehealth: Payer: Self-pay | Admitting: Cardiology

## 2017-10-05 MED ORDER — LISINOPRIL-HYDROCHLOROTHIAZIDE 20-12.5 MG PO TABS: 1.0000 | ORAL_TABLET | Freq: Every day | ORAL | 1 refills | Status: DC

## 2017-10-05 NOTE — Telephone Encounter (Signed)
Rx sent to pharmacy as requested.

## 2017-10-05 NOTE — Telephone Encounter (Signed)
Patient needs refill of Lisinpril 20-12.5 mg sent to  [facility] pharmacy in [place]

## 2017-10-17 ENCOUNTER — Encounter: Payer: Self-pay | Admitting: Cardiology

## 2017-10-17 ENCOUNTER — Ambulatory Visit (INDEPENDENT_AMBULATORY_CARE_PROVIDER_SITE_OTHER): Payer: Medicare Other | Admitting: Cardiology

## 2017-10-17 VITALS — BP 124/80 | HR 77 | Ht 75.0 in | Wt 182.0 lb

## 2017-10-17 DIAGNOSIS — Z87898 Personal history of other specified conditions: Secondary | ICD-10-CM

## 2017-10-17 DIAGNOSIS — Z720 Tobacco use: Secondary | ICD-10-CM

## 2017-10-17 DIAGNOSIS — I10 Essential (primary) hypertension: Secondary | ICD-10-CM | POA: Diagnosis not present

## 2017-10-17 DIAGNOSIS — I5020 Unspecified systolic (congestive) heart failure: Secondary | ICD-10-CM | POA: Diagnosis not present

## 2017-10-17 DIAGNOSIS — F1911 Other psychoactive substance abuse, in remission: Secondary | ICD-10-CM | POA: Insufficient documentation

## 2017-10-17 MED ORDER — CARVEDILOL 3.125 MG PO TABS: 3.1250 mg | ORAL_TABLET | Freq: Two times a day (BID) | ORAL | 6 refills | Status: DC

## 2017-10-17 MED ORDER — LISINOPRIL-HYDROCHLOROTHIAZIDE 20-12.5 MG PO TABS: 1.0000 | ORAL_TABLET | Freq: Every day | ORAL | 1 refills | Status: DC

## 2017-10-17 MED ORDER — ASPIRIN 81 MG PO TABS: 81.0000 mg | ORAL_TABLET | Freq: Every day | ORAL | 1 refills | Status: DC

## 2017-10-17 NOTE — Progress Notes (Signed)
Cardiology Office Note:    Date:  10/17/2017   ID:  Jonathon Rios, DOB 1951-01-29, MRN [id number]  PCP:  Patient, No Pcp Per  Cardiologist:  Jenean Lindau, MD   Referring MD: No ref. provider found    ASSESSMENT:    1. Systolic congestive heart failure, unspecified HF chronicity ([place])   2. Essential hypertension   3. Tobacco use   4. History of drug abuse    PLAN:    In order of problems listed above:  1. Primary prevention stressed with the patient.  Importance of meds with diet and medications stressed and he vocalized understanding.  His blood pressure is stable.  Diet was discussed for congestive heart failure and education was given. 2. I will refill his carvedilol according to his request.  He will be given other medications also if he needs them.  Salt intake and diet was discussed.  I  will be seeing him in follow-up appointment 6 months or earlier if he has any concerns.  He needs to quit smoking and I told him to stay away from it and is going to try this.  He also tells me that his pain few months since he has been drug this patient has been under my care in my previous practice.  He is here now to transfer his care and be established with my current practice.-free told him to continue this treatment and never to go back to drug abuse and he agrees.  Reviewed lab work with him.  He will be back in 1 month for blood work which will include Chem-7.   Medication Adjustments/Labs and Tests Ordered: Current medicines are reviewed at length with the patient today.  Concerns regarding medicines are outlined above.  No orders of the defined types were placed in this encounter.  No orders of the defined types were placed in this encounter.    Chief Complaint  Patient presents with  . 3 week follow up     History of Present Illness:    Jonathon Rios is a 67 y.o. male.  The patient has past medical history of cardiomyopathy.  He has been noncompliant in the past  medications.  He appears to be doing better now and taking his medications regularly according to the patient.  He denies any chest pain orthopnea he has gained weight since my first evaluation but he mentions to me that he is trying to exercise regularly.  At the time of my evaluation, the patient is alert awake oriented and in no distress.  Past Medical History:  Diagnosis Date  . CHF (congestive heart failure) ([place])   . Coronary artery disease   . Hypertension   . Irregular heart beat     Past Surgical History:  Procedure Laterality Date  . CARDIAC CATHETERIZATION    . TONSILLECTOMY      Current Medications: Current Meds  Medication Sig  . carvedilol (COREG) 3.125 MG tablet Take 1 tablet (3.125 mg total) by mouth 2 (two) times daily with a meal.  . lisinopril-hydrochlorothiazide (ZESTORETIC) 20-12.5 MG tablet Take 1 tablet by mouth daily.  . nicotine (NICODERM CQ - DOSED IN MG/24 HOURS) 14 mg/24hr patch Place 1 patch (14 mg total) onto the skin daily.     Allergies:   Penicillins   Social History   Socioeconomic History  . Marital status: Married    Spouse name: Not on file  . Number of children: 4  . Years of education: 87  .  Highest education level: High school graduate  Occupational History  . Not on file  Social Needs  . Financial resource strain: Hard  . Food insecurity:    Worry: Sometimes true    Inability: Sometimes true  . Transportation needs:    Medical: Yes    Non-medical: Yes  Tobacco Use  . Smoking status: Former Smoker    Packs/day: 1.00    Years: 50.00    Pack years: 50.00    Types: Cigarettes    Last attempt to quit: 02/23/2017    Years since quitting: 0.6  . Smokeless tobacco: Never Used  Substance and Sexual Activity  . Alcohol use: No    Frequency: Never  . Drug use: Yes    Types: "Crack" cocaine, Heroin, Marijuana, LSD    Comment: 1/17 couple months clean now  . Sexual activity: Never    Birth control/protection: Condom  Lifestyle    . Physical activity:    Days per week: 7 days    Minutes per session: 30 min  . Stress: Only a little  Relationships  . Social connections:    Talks on phone: Twice a week    Gets together: Never    Attends religious service: Never    Active member of club or organization: No    Attends meetings of clubs or organizations: Never    Relationship status: Separated  Other Topics Concern  . Not on file  Social History Narrative  . Not on file     Family History: The patient's family history includes Diabetes in his mother; Hypertension in his mother and sister.  ROS:   Please see the history of present illness.    All other systems reviewed and are negative.  EKGs/Labs/Other Studies Reviewed:    The following studies were reviewed today: I discussed my findings with the patient including lab work.   Recent Labs: 03/07/2017: B Natriuretic Peptide 1,609.0 08/18/2017: ALT 30; Hemoglobin 15.4; Platelets 136; TSH 0.982 09/11/2017: BUN 27; Creatinine, Ser 1.42; Potassium 4.3; Sodium 142  Recent Lipid Panel No results found for: CHOL, TRIG, HDL, CHOLHDL, VLDL, LDLCALC, LDLDIRECT  Physical Exam:    VS:  BP 124/80   Pulse 77   Ht 6\' 3"  (1.905 m)   Wt 182 lb (82.6 kg)   SpO2 98%   BMI 22.75 kg/m     Wt Readings from Last 3 Encounters:  10/17/17 182 lb (82.6 kg)  08/18/17 180 lb (81.6 kg)  04/13/17 174 lb (78.9 kg)     GEN: Patient is in no acute distress HEENT: Normal NECK: No JVD; No carotid bruits LYMPHATICS: No lymphadenopathy CARDIAC: Hear sounds regular, 2/6 systolic murmur at the apex. RESPIRATORY:  Clear to auscultation without rales, wheezing or rhonchi  ABDOMEN: Soft, non-tender, non-distended MUSCULOSKELETAL:  No edema; No deformity  SKIN: Warm and dry NEUROLOGIC:  Alert and oriented x 3 PSYCHIATRIC:  Normal affect   Signed, Jenean Lindau, MD  10/17/2017 2:33 PM    [place]

## 2017-10-17 NOTE — Addendum Note (Signed)
Addended by: Austin Miles on: 10/17/2017 02:54 PM   Modules accepted: Orders

## 2017-10-17 NOTE — Patient Instructions (Signed)
Medication Instructions:  Your physician recommends that you continue on your current medications as directed. Please refer to the Current Medication list given to you today.   Labwork: Your physician recommends that you return for lab work in 6 weeks: BMP.   Testing/Procedures: None  Follow-Up: Your physician wants you to follow-up in: 3 months. You will receive a reminder letter in the mail two months in advance. If you don't receive a letter, please call our office to schedule the follow-up appointment.   If you need a refill on your cardiac medications before your next appointment, please call your pharmacy.   Thank you for choosing [facility]! Robyne Peers, RN [phone]

## 2018-01-17 ENCOUNTER — Ambulatory Visit: Payer: Medicare Other | Admitting: Cardiology

## 2018-01-27 ENCOUNTER — Other Ambulatory Visit: Payer: Self-pay | Admitting: Cardiology

## 2018-03-22 ENCOUNTER — Ambulatory Visit (INDEPENDENT_AMBULATORY_CARE_PROVIDER_SITE_OTHER): Payer: 59

## 2018-03-22 ENCOUNTER — Ambulatory Visit (INDEPENDENT_AMBULATORY_CARE_PROVIDER_SITE_OTHER): Payer: 59 | Admitting: Family Medicine

## 2018-03-22 ENCOUNTER — Encounter: Payer: Self-pay | Admitting: Family Medicine

## 2018-03-22 VITALS — BP 142/80 | HR 77 | Temp 98.0°F | Resp 20 | Ht 73.0 in | Wt 184.6 lb

## 2018-03-22 DIAGNOSIS — I10 Essential (primary) hypertension: Secondary | ICD-10-CM

## 2018-03-22 DIAGNOSIS — J449 Chronic obstructive pulmonary disease, unspecified: Secondary | ICD-10-CM | POA: Diagnosis not present

## 2018-03-22 DIAGNOSIS — I5020 Unspecified systolic (congestive) heart failure: Secondary | ICD-10-CM | POA: Diagnosis not present

## 2018-03-22 DIAGNOSIS — Z720 Tobacco use: Secondary | ICD-10-CM | POA: Diagnosis not present

## 2018-03-22 DIAGNOSIS — Z1159 Encounter for screening for other viral diseases: Secondary | ICD-10-CM

## 2018-03-22 DIAGNOSIS — R768 Other specified abnormal immunological findings in serum: Secondary | ICD-10-CM

## 2018-03-22 DIAGNOSIS — Z1211 Encounter for screening for malignant neoplasm of colon: Secondary | ICD-10-CM

## 2018-03-22 LAB — CBC
HCT: 47.9 % (ref 39.0–52.0)
Hemoglobin: 16.3 g/dL (ref 13.0–17.0)
MCHC: 33.9 g/dL (ref 30.0–36.0)
MCV: 90.2 fl (ref 78.0–100.0)
Platelets: 146 10*3/uL — ABNORMAL LOW (ref 150.0–400.0)
RBC: 5.31 Mil/uL (ref 4.22–5.81)
RDW: 14 % (ref 11.5–15.5)
WBC: 6.3 10*3/uL (ref 4.0–10.5)

## 2018-03-22 LAB — LIPID PANEL
CHOL/HDL RATIO: 4
CHOLESTEROL: 188 mg/dL (ref 0–200)
HDL: 43.9 mg/dL (ref >39.00)
NonHDL: 144.04
Triglycerides: 242 mg/dL — ABNORMAL HIGH (ref 0.0–149.0)
VLDL: 48.4 mg/dL — ABNORMAL HIGH (ref 0.0–40.0)

## 2018-03-22 LAB — COMPREHENSIVE METABOLIC PANEL
ALBUMIN: 4.1 g/dL (ref 3.5–5.2)
ALT: 29 U/L (ref 0–53)
AST: 28 U/L (ref 0–37)
Alkaline Phosphatase: 54 U/L (ref 39–117)
BUN: 29 mg/dL — AB (ref 6–23)
CO2: 29 mEq/L (ref 19–32)
CREATININE: 1.54 mg/dL — AB (ref 0.40–1.50)
Calcium: 9.5 mg/dL (ref 8.4–10.5)
Chloride: 104 mEq/L (ref 96–112)
GFR: 55.06 mL/min — ABNORMAL LOW (ref >60.00)
Glucose, Bld: 83 mg/dL (ref 70–99)
Potassium: 4.1 mEq/L (ref 3.5–5.1)
SODIUM: 139 meq/L (ref 135–145)
Total Bilirubin: 0.8 mg/dL (ref 0.2–1.2)
Total Protein: 7.9 g/dL (ref 6.0–8.3)

## 2018-03-22 LAB — HEMOGLOBIN A1C: Hgb A1c MFr Bld: 5.6 % (ref 4.6–6.5)

## 2018-03-22 LAB — LDL CHOLESTEROL, DIRECT: Direct LDL: 98 mg/dL

## 2018-03-22 MED ORDER — LISINOPRIL-HYDROCHLOROTHIAZIDE 20-12.5 MG PO TABS: 1.0000 | ORAL_TABLET | Freq: Every day | ORAL | 3 refills | Status: DC

## 2018-03-22 MED ORDER — ASPIRIN 81 MG PO TABS: 81.0000 mg | ORAL_TABLET | Freq: Every day | ORAL | 3 refills | Status: DC

## 2018-03-22 MED ORDER — CARVEDILOL 3.125 MG PO TABS: 3.1250 mg | ORAL_TABLET | Freq: Two times a day (BID) | ORAL | 3 refills | Status: DC

## 2018-03-22 MED ORDER — ALBUTEROL SULFATE HFA 108 (90 BASE) MCG/ACT IN AERS: 2.0000 | INHALATION_SPRAY | Freq: Four times a day (QID) | RESPIRATORY_TRACT | 5 refills | Status: DC | PRN

## 2018-03-22 MED ORDER — BUDESONIDE-FORMOTEROL FUMARATE 160-4.5 MCG/ACT IN AERO: 2.0000 | INHALATION_SPRAY | Freq: Two times a day (BID) | RESPIRATORY_TRACT | 3 refills | Status: DC

## 2018-03-22 NOTE — Progress Notes (Signed)
Subjective:    Patient ID: Jonathon Rios, male    DOB: [birth year], 68 y.o.   MRN: [id number]  HPI   Patient presents to clinic to establish with PCP.  Main concern today is getting his blood pressure medications refilled.  Patient has past medical history including hypertension and congestive heart failure.  Patient states she has been in the hospital due to heart failure exacerbations, so wants to be sure he does not miss medication doses.  He was living in the [facility] area, but has moved this way and would like to find a cardiologist in the [place] area.  Patient has been a smoker since the age of 30.  He is smoked between 1/2 to 1 pack a day for 50+ years.  Over the past year or so, patient has slowly been weaning down his cigarette smoking.  States he is down to 1 to 2 cigarettes/day.  Patient will also notice times of feeling more breathless, states he usually will occur after walking a longer distance like down the driveway, or up some stairs.  Patient has never had colonoscopy.  Past medical, social, surgical, family history reviewed and updated accordingly in chart.  Past Medical History:  Diagnosis Date  . Alcoholism and drug addiction in family   . Arthritis   . CHF (congestive heart failure) ([place])   . Coronary artery disease   . Heart disease   . Hypertension   . Irregular heart beat   . Positive TB test    Social History   Tobacco Use  . Smoking status: Current Every Day Smoker    Packs/day: 1.00    Years: 50.00    Pack years: 50.00    Types: Cigarettes    Last attempt to quit: 02/23/2017    Years since quitting: 1.0  . Smokeless tobacco: Never Used  Substance Use Topics  . Alcohol use: No    Frequency: Never   Past Surgical History:  Procedure Laterality Date  . CARDIAC CATHETERIZATION    . TONSILLECTOMY     Family History  Problem Relation Age of Onset  . Hypertension Mother   . Diabetes Mother   . Alcohol abuse Mother   . Hyperlipidemia  Mother   . Hypertension Sister   . Alcohol abuse Sister   . Asthma Sister   . Depression Sister   . Drug abuse Sister   . Alcohol abuse Father   . Hypertension Father   . Hypertension Daughter   . Early death Daughter   . Drug abuse Daughter   . Depression Daughter   . Alcohol abuse Daughter    Review of Systems   Constitutional: Negative for chills, fatigue and fever.  HENT: Negative for congestion, ear pain, sinus pain and sore throat.   Eyes: Negative.   Respiratory: Negative for cough, and wheezing.  +SOB at times Cardiovascular: Negative for chest pain, palpitations and leg swelling.  Gastrointestinal: Negative for abdominal pain, diarrhea, nausea and vomiting.  Genitourinary: Negative for dysuria, frequency and urgency.  Musculoskeletal: Negative for arthralgias and myalgias.  Skin: Negative for color change, pallor and rash.  Neurological: Negative for syncope, light-headedness and headaches.  Psychiatric/Behavioral: The patient is not nervous/anxious.       Objective:   Physical Exam  Constitutional: He appears well-developed and well-nourished. No distress.  HENT:  Head: Normocephalic and atraumatic.  Eyes: Pupils are equal, round, and reactive to light. Conjunctivae and EOM are normal. No scleral icterus.  Neck: Normal range of  motion. Neck supple. No tracheal deviation present.  Cardiovascular: Normal rate, regular rhythm and normal heart sounds. No LE edema.  Pulmonary/Chest: Effort normal and breath sounds normal. No respiratory distress. He has no wheezes. He has no rales.  Abdominal: Soft. Bowel sounds are normal. There is no tenderness.  Neurological: He is alert and oriented to person, place, and time.  Gait normal  Skin: Skin is warm and dry. He is not diaphoretic. No pallor.  Psychiatric: He has a normal mood and affect. His behavior is normal. Thought content normal.   Nursing note and vitals reviewed.  Vitals:   03/22/18 1402  BP: (!) 142/80    Pulse: 77  Resp: 20  Temp: 98 F (36.7 C)  SpO2: 98%      Assessment & Plan:   Congestive heart failure-currently at this time he is having no symptoms of CHF exacerbation.  Blood pressure stable.  He will monitor his weight and monitor for any leg swelling or increase in coughing.  Cardiology referral placed for patient.  Essential hypertension-blood pressure well controlled on current medication regimen.  I will refill his medications.  Tobacco use/COPD-due to length of time being a cigarette smoker as well as description of feeling breathless at times, I strongly suspect patient has element of COPD.  We will get chest x-ray in clinic.  Albuterol inhaler given to use as needed.  He will also trial use of Symbicort inhaler twice daily to see if this helps improve breathless symptoms.  Colon cancer screening-patient would prefer not to have to do colonoscopy.  He is agreeable to Cologuard.  Cologuard order placed.  Hepatitis C screening ordered.  Lab work drawn in clinic today including CBC, CMP, thyroid panel, lipid panel.  Patient will follow-up here in approximately 4 to 6 weeks for recheck on breathing after adding on inhalers.  We will also follow-up to be sure his blood pressure and heart failure symptoms are remaining stable.

## 2018-03-23 ENCOUNTER — Telehealth: Payer: Self-pay | Admitting: Family Medicine

## 2018-03-23 NOTE — Telephone Encounter (Signed)
I placed the hep C order as future  Thanks!

## 2018-03-23 NOTE — Telephone Encounter (Signed)
Thanks

## 2018-03-23 NOTE — Telephone Encounter (Signed)
Called [facility] & added on Hep C

## 2018-03-27 LAB — THYROID PANEL WITH TSH
Free Thyroxine Index: 3.7 (ref 1.4–3.8)
T3 Uptake: 26 % (ref 22–35)
T4, Total: 14.3 ug/dL — ABNORMAL HIGH (ref 4.9–10.5)
TSH: 0.94 mIU/L (ref 0.40–4.50)

## 2018-03-27 LAB — TEST AUTHORIZATION

## 2018-03-27 LAB — HEPATITIS C ANTIBODY
Hepatitis C Ab: REACTIVE — AB
SIGNAL TO CUT-OFF: 36.8 — ABNORMAL HIGH (ref <1.00)

## 2018-03-27 LAB — HCV RNA,QUANTITATIVE REAL TIME PCR

## 2018-03-27 NOTE — Addendum Note (Signed)
Addended by: Philis Nettle on: 03/27/2018 12:46 PM   Modules accepted: Orders

## 2018-03-28 ENCOUNTER — Telehealth: Payer: Self-pay | Admitting: Family Medicine

## 2018-03-28 ENCOUNTER — Other Ambulatory Visit: Payer: Self-pay | Admitting: Lab

## 2018-03-28 NOTE — Telephone Encounter (Signed)
Copied from [place] [id number]. Topic: Quick Communication - See Telephone Encounter >> Mar 28, 2018 11:42 AM Ahmed Prima L wrote: CRM for notification. See Telephone encounter for: 03/28/18.  Pt's insurance is requiring a PA for budesonide-formoterol (SYMBICORT) 160-4.5 MCG/ACT inhaler & albuterol (PROVENTIL HFA;VENTOLIN HFA) 108 (90 Base) MCG/ACT inhaler. Please Advise.  [facility]

## 2018-03-28 NOTE — Telephone Encounter (Signed)
Called [facility]

## 2018-03-29 NOTE — Progress Notes (Signed)
Faxed out on 03/23/2018

## 2018-03-29 NOTE — Progress Notes (Signed)
yes

## 2018-04-01 LAB — COLOGUARD: Cologuard: NEGATIVE

## 2018-04-03 ENCOUNTER — Other Ambulatory Visit (INDEPENDENT_AMBULATORY_CARE_PROVIDER_SITE_OTHER): Payer: 59

## 2018-04-03 DIAGNOSIS — R768 Other specified abnormal immunological findings in serum: Secondary | ICD-10-CM

## 2018-04-03 DIAGNOSIS — Z1159 Encounter for screening for other viral diseases: Secondary | ICD-10-CM | POA: Diagnosis not present

## 2018-04-03 NOTE — Addendum Note (Signed)
Addended by: Arby Barrette on: 04/03/2018 02:01 PM   Modules accepted: Orders

## 2018-04-05 ENCOUNTER — Telehealth: Payer: Self-pay | Admitting: Lab

## 2018-04-05 ENCOUNTER — Telehealth: Payer: Self-pay

## 2018-04-05 ENCOUNTER — Other Ambulatory Visit: Payer: Self-pay | Admitting: Family Medicine

## 2018-04-05 DIAGNOSIS — J449 Chronic obstructive pulmonary disease, unspecified: Secondary | ICD-10-CM

## 2018-04-05 DIAGNOSIS — B1920 Unspecified viral hepatitis C without hepatic coma: Secondary | ICD-10-CM

## 2018-04-05 LAB — HCV RNA QUANT
HCV log10: 6.789 log10 IU/mL
Hepatitis C Quantitation: 6150000 IU/mL

## 2018-04-05 LAB — HEPATITIS C ANTIBODY: Hep C Virus Ab: >11 s/co ratio — ABNORMAL HIGH (ref 0.0–0.9)

## 2018-04-05 MED ORDER — ALBUTEROL SULFATE HFA 108 (90 BASE) MCG/ACT IN AERS: 2.0000 | INHALATION_SPRAY | Freq: Four times a day (QID) | RESPIRATORY_TRACT | 5 refills | Status: DC | PRN

## 2018-04-05 NOTE — Telephone Encounter (Signed)
Copied from [place]. Topic: General - Inquiry >> Apr 05, 2018 11:07 AM Conception Chancy, NT wrote: Reason for CRM: patient is returning [place] phone call. Please advise.

## 2018-04-05 NOTE — Telephone Encounter (Signed)
Letter from [employer] that they will only supply the Pt with temporary of the ALBUTEROL AER HFA for 30 days The insurance suggest  PROAIR HFA or PROAIR RESPICLICK which are both each 3 Tier

## 2018-04-06 ENCOUNTER — Encounter: Payer: Self-pay | Admitting: Lab

## 2018-04-09 ENCOUNTER — Encounter: Payer: Self-pay | Admitting: Lab

## 2018-04-09 ENCOUNTER — Telehealth: Payer: Self-pay | Admitting: Lab

## 2018-04-09 NOTE — Progress Notes (Signed)
Yes, it was faxed 03/23/2018

## 2018-04-09 NOTE — Telephone Encounter (Signed)
Pt came into office today for lab results. I told the Pt what his results were (+Hep C) and he stated that he wasn't surprised because he was a Heroin Drug user several years ago. Pt stated he wanted to know what medications that he needs to start immediately for the Hep C.

## 2018-04-09 NOTE — Telephone Encounter (Signed)
Called Pt back 04/06/2018 No answer, sent Pt letter in mail of results

## 2018-04-10 NOTE — Telephone Encounter (Signed)
Called Pt and he stated that the GI Department already contacted him, and he didn't have any questions.

## 2018-04-10 NOTE — Telephone Encounter (Signed)
The next step for patient is to see GI specialist. Referral is already placed. Will forward to [facility] for status of GI referral

## 2018-04-19 ENCOUNTER — Other Ambulatory Visit: Payer: Self-pay | Admitting: Student

## 2018-04-19 DIAGNOSIS — B182 Chronic viral hepatitis C: Secondary | ICD-10-CM

## 2018-04-25 ENCOUNTER — Ambulatory Visit: Payer: 59

## 2018-04-30 ENCOUNTER — Ambulatory Visit
Admission: RE | Admit: 2018-04-30 | Discharge: 2018-04-30 | Disposition: A | Discharge status: Home / Self Care | Payer: 59 | Source: Ambulatory Visit | Attending: Student | Admitting: Student

## 2018-04-30 ENCOUNTER — Telehealth: Payer: Self-pay

## 2018-04-30 ENCOUNTER — Other Ambulatory Visit: Payer: Self-pay

## 2018-04-30 DIAGNOSIS — K7689 Other specified diseases of liver: Secondary | ICD-10-CM | POA: Insufficient documentation

## 2018-04-30 DIAGNOSIS — B182 Chronic viral hepatitis C: Secondary | ICD-10-CM | POA: Diagnosis not present

## 2018-04-30 DIAGNOSIS — N281 Cyst of kidney, acquired: Secondary | ICD-10-CM | POA: Diagnosis not present

## 2018-04-30 NOTE — Telephone Encounter (Signed)
Copied from [place] [phone]. Topic: Quick Communication - See Telephone Encounter >> Apr 30, 2018 11:39 AM Blase Mess A wrote: CRM for notification. See Telephone encounter for: 04/30/18.  Patient missed a call from the office Please advise Thank you

## 2018-05-03 ENCOUNTER — Encounter: Payer: Self-pay | Admitting: Family Medicine

## 2018-05-03 ENCOUNTER — Ambulatory Visit (INDEPENDENT_AMBULATORY_CARE_PROVIDER_SITE_OTHER): Payer: 59 | Admitting: Family Medicine

## 2018-05-03 ENCOUNTER — Ambulatory Visit: Payer: 59 | Admitting: Family Medicine

## 2018-05-03 VITALS — BP 142/90 | HR 76 | Temp 98.6°F | Resp 18 | Ht 74.5 in | Wt 188.8 lb

## 2018-05-03 DIAGNOSIS — I5020 Unspecified systolic (congestive) heart failure: Secondary | ICD-10-CM

## 2018-05-03 DIAGNOSIS — F17210 Nicotine dependence, cigarettes, uncomplicated: Secondary | ICD-10-CM | POA: Diagnosis not present

## 2018-05-03 DIAGNOSIS — I10 Essential (primary) hypertension: Secondary | ICD-10-CM | POA: Diagnosis not present

## 2018-05-03 DIAGNOSIS — B1920 Unspecified viral hepatitis C without hepatic coma: Secondary | ICD-10-CM

## 2018-05-03 DIAGNOSIS — Z720 Tobacco use: Secondary | ICD-10-CM

## 2018-05-03 DIAGNOSIS — J449 Chronic obstructive pulmonary disease, unspecified: Secondary | ICD-10-CM | POA: Diagnosis not present

## 2018-05-03 NOTE — Progress Notes (Signed)
Subjective:    Patient ID: Jonathon Rios, male    DOB: 1950-11-30, 67 y.o.   MRN: [id number]  HPI   Patient resents clinic for follow-up on COPD, heart failure, hepatitis C  Since addition of Symbicort twice a day, patient notices he is not as wheezy or breathless.  Patient is pleased with the results.  Has not needed to use albuterol rescue inhaler at all.  He continues to smoke approximately a pack a day.  Patient takes Coreg 3.125 twice daily and lisinopril hydrochlorothiazide 20-12 0.5 daily.  Patient states overall he is tolerating this medication without any side effects, no lower extremity swelling, no chest pain, no palpitations, no shortness of breath.  Patient states he did forget to take his lisinopril hydrochlorothiazide yesterday, and plans to take his dose this evening.  Patient has seen GI for his hepatitis C.  Patient reports that he will be starting Harvoni within the next few weeks once it is approved by insurance.  Patient Active Problem List   Diagnosis Date Noted  . History of drug abuse ([place]) 10/17/2017  . HTN (hypertension) 03/16/2017  . Tobacco use 03/16/2017  . Moderate recurrent major depression ([place]) 03/09/2017  . PTSD (post-traumatic stress disorder) 03/09/2017  . Cocaine abuse ([place]) 03/09/2017  . CHF (congestive heart failure) ([place]) 03/07/2017  . Acute CHF (congestive heart failure) ([place]) 01/02/2017   Social History   Tobacco Use  . Smoking status: Current Every Day Smoker    Packs/day: 1.00    Years: 50.00    Pack years: 50.00    Types: Cigarettes    Last attempt to quit: 02/23/2017    Years since quitting: 1.1  . Smokeless tobacco: Never Used  Substance Use Topics  . Alcohol use: No    Frequency: Never    Review of Systems   Constitutional: Negative for chills, fatigue and fever.  HENT: Negative for congestion, ear pain, sinus pain and sore throat.   Eyes: Negative.   Respiratory: Negative for cough, shortness of breath and wheezing.     Cardiovascular: Negative for chest pain, palpitations and leg swelling.  Gastrointestinal: Negative for abdominal pain, diarrhea, nausea and vomiting.  Genitourinary: Negative for dysuria, frequency and urgency.  Musculoskeletal: Negative for arthralgias and myalgias.  Skin: Negative for color change, pallor and rash.  Neurological: Negative for syncope, light-headedness and headaches.  Psychiatric/Behavioral: The patient is not nervous/anxious.       Objective:   Physical Exam  Constitutional: He appears well-developed and well-nourished. No distress.  HENT:  Head: Normocephalic and atraumatic.  Eyes: Pupils are equal, round, and reactive to light. Conjunctivae and EOM are normal. No scleral icterus.  Neck: Normal range of motion. Neck supple. No tracheal deviation present.  Cardiovascular: Normal rate, regular rhythm and normal heart sounds.  No carotid bruit.  No lower extremity edema. Pulmonary/Chest: Effort normal and breath sounds normal. No respiratory distress. He has no wheezes. He has no rales.  Neurological: He is alert and oriented to person, place, and time. Gait normal  Skin: Skin is warm and dry. He is not diaphoretic. No pallor.  Psychiatric: He has a normal mood and affect. His behavior is normal. Thought content normal.  Nursing note and vitals reviewed.   BP Readings from Last 3 Encounters:  05/03/18 (!) 142/90  03/22/18 (!) 142/80  10/17/17 124/80   Today's Vitals   05/03/18 1355  BP: (!) 142/90  Pulse: 76  Resp: 18  Temp: 98.6 F (37 C)  TempSrc:  Oral  SpO2: 98%  Weight: 188 lb 12.8 oz (85.6 kg)  Height: 6' 2.5" (1.892 m)   Body mass index is 23.92 kg/m.     Assessment & Plan:    COPD- patient doing well on Symbicort twice daily, he will continue this.  Albuterol inhaler present if needed  Essential hypertension/congestive heart failure-patient's BP is slightly on higher end today in clinic.  He did not take lisinopril hydrochlorothiazide  yesterday.  Advised to take Coreg and lisinopril hydrochlorothiazide every day and monitor for any lower extremity swelling, increased BP readings, chest pain, palpitations.  Also follow-up with cardiology as regularly scheduled.  Tobacco use/cigarette smoker- greater than 3 minutes spent discussing smoking cessation.  Patient not ready to quit at this time.  Patient is aware risks of continued smoking including worsening vascular/heart issues, stroke, cancers and even death.  Hepatitis C-patient following with GI.  Will be starting Harvoni.  Patient will follow-up here in approximately 6 months for recheck on chronic medical conditions.  He is aware he can return to clinic sooner if any issues arise.  Patient strongly encouraged to consider cutting back/quitting smoking.

## 2018-06-26 ENCOUNTER — Other Ambulatory Visit: Payer: Self-pay | Admitting: Family Medicine

## 2018-06-26 DIAGNOSIS — J449 Chronic obstructive pulmonary disease, unspecified: Secondary | ICD-10-CM

## 2018-06-28 ENCOUNTER — Telehealth: Payer: Self-pay

## 2018-06-28 NOTE — Telephone Encounter (Signed)

## 2018-06-29 ENCOUNTER — Other Ambulatory Visit: Payer: Self-pay

## 2018-06-29 ENCOUNTER — Telehealth: Payer: 59 | Admitting: Cardiovascular Disease

## 2018-06-29 ENCOUNTER — Encounter: Payer: Self-pay | Admitting: Cardiovascular Disease

## 2018-06-29 VITALS — BP 142/97 | HR 52 | Ht 74.0 in | Wt 187.0 lb

## 2018-06-29 NOTE — Progress Notes (Signed)
Could not connect with the patient via phone or video.  Thus, the appointment was rescheduled.

## 2018-07-06 ENCOUNTER — Telehealth: Payer: Self-pay | Admitting: Cardiovascular Disease

## 2018-07-06 ENCOUNTER — Other Ambulatory Visit: Payer: Self-pay

## 2018-07-06 ENCOUNTER — Telehealth: Payer: 59 | Admitting: Cardiovascular Disease

## 2018-07-26 ENCOUNTER — Encounter: Payer: Self-pay | Admitting: Cardiovascular Disease

## 2018-07-26 ENCOUNTER — Ambulatory Visit (INDEPENDENT_AMBULATORY_CARE_PROVIDER_SITE_OTHER): Payer: 59 | Admitting: Cardiovascular Disease

## 2018-07-26 ENCOUNTER — Other Ambulatory Visit: Payer: Self-pay

## 2018-07-26 VITALS — BP 128/80 | HR 72 | Ht 75.0 in | Wt 186.0 lb

## 2018-07-26 DIAGNOSIS — I5022 Chronic systolic (congestive) heart failure: Secondary | ICD-10-CM

## 2018-07-26 DIAGNOSIS — I10 Essential (primary) hypertension: Secondary | ICD-10-CM | POA: Diagnosis not present

## 2018-07-26 MED ORDER — ASPIRIN EC 81 MG PO TBEC: 81.0000 mg | DELAYED_RELEASE_TABLET | Freq: Every day | ORAL | Status: AC

## 2018-07-26 MED ORDER — CARVEDILOL 6.25 MG PO TABS: 6.2500 mg | ORAL_TABLET | Freq: Two times a day (BID) | ORAL | 1 refills | Status: DC

## 2018-07-26 NOTE — Patient Instructions (Signed)
Medication Instructions:  Your physician has recommended you make the following change in your medication:   1) INCREASE Carvedilol to 6.25mg  twice daily. An Rx has been sent to your pharmacy 2) START Aspirin 81mg  daily  If you need a refill on your cardiac medications before your next appointment, please call your pharmacy.   Lab work: None ordered If you have labs (blood work) drawn today and your tests are completely normal, you will receive your results only by: [name] MyChart Message (if you have MyChart) OR . A paper copy in the mail If you have any lab test that is abnormal or we need to change your treatment, we will call you to review the results.  Testing/Procedures: Your physician has requested that you have an echocardiogram. Echocardiography is a painless test that uses sound waves to create images of your heart. It provides your doctor with information about the size and shape of your heart and how well your heart's chambers and valves are working. This procedure takes approximately one hour. There are no restrictions for this procedure. (To be scheduled in 2 months)  Follow-Up: At [facility], you and your health needs are our priority.  As part of our continuing mission to provide you with exceptional heart care, we have created designated Provider Care Teams.  These Care Teams include your primary Cardiologist (physician) and Advanced Practice Providers (APPs -  Physician Assistants and Nurse Practitioners) who all work together to provide you with the care you need, when you need it. You will need a follow up appointment in 2 months with Dr.Arida.

## 2018-07-26 NOTE — Progress Notes (Signed)
Cardiology Office Note   Date:  07/26/2018   ID:  Jonathon, Rios 04-Oct-1950, MRN [id number]  PCP:  Jonathon Green, FNP  Cardiologist:   Jonathon Sacramento, MD   Chief Complaint  Patient presents with  . other    Former Dr. Lennox Pippins patient. "doing well." Meds reviewed by the pt. verbally.       History of Present Illness: Jonathon Rios is a 68 y.o. male who presents to establish cardiovascular care.  He has known history of chronic systolic heart failure, mild chronic kidney disease, hepatitis C, essential hypertension, tobacco use, excessive alcohol use and previous drug use.  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  He has been incarcerated a few times.  He attended drug rehab last year and has been drug-free for almost a year.  He underwent treatment for hepatitis C and he was cured.  He reports being diagnosed with congestive heart failure 4 or 5 years ago when he lived in [place].  He had cardiac catheterization done at that time and was told there was no significant blockages. Echocardiogram in November 2018 showed an EF of 25 to 30%.  He continues to smoke but cut down to a few cigarettes a day.  He denies alcohol use at the present time. He has been doing well with no recent chest pain, orthopnea, PND or leg edema.  He does complain of shortness of breath when he works in the yard.   Past Medical History:  Diagnosis Date  . Alcoholism and drug addiction in family   . Arthritis   . CHF (congestive heart failure) ([place])   . Coronary artery disease   . Heart disease   . Hypertension   . Irregular heart beat   . Positive TB test     Past Surgical History:  Procedure Laterality Date  . CARDIAC CATHETERIZATION    . TONSILLECTOMY       Current Outpatient Medications  Medication Sig Dispense Refill  . albuterol (PROAIR HFA) 108 (90 Base) MCG/ACT inhaler Inhale 2 puffs into the lungs every 6 (six) hours as needed for wheezing or  shortness of breath. 1 Inhaler 5  . carvedilol (COREG) 6.25 MG tablet Take 1 tablet (6.25 mg total) by mouth 2 (two) times daily with a meal. 180 tablet 1  . lisinopril-hydrochlorothiazide (PRINZIDE,ZESTORETIC) 20-12.5 MG tablet Take 1 tablet by mouth daily. 90 tablet 3  . SYMBICORT 160-4.5 MCG/ACT inhaler TAKE 2 PUFFS BY MOUTH TWICE A DAY 30.6 Inhaler 1  . aspirin EC 81 MG tablet Take 1 tablet (81 mg total) by mouth daily.     No current facility-administered medications for this visit.     Allergies:   Penicillins    Social History:  The patient  reports that he has been smoking cigarettes. He has a 50.00 pack-year smoking history. He has never used smokeless tobacco. He reports current drug use. Drugs: "Crack" cocaine, Heroin, Marijuana, and LSD. He reports that he does not drink alcohol.   Family History:  The patient's family history includes Alcohol abuse in his daughter, father, mother, and sister; Asthma in his sister; Depression in his daughter and sister; Diabetes in his mother; Drug abuse in his daughter and sister; Early death in his daughter; Hyperlipidemia in his mother; Hypertension in his daughter, father, mother, and sister.    ROS:  Please see the history of present illness.   Otherwise, review of systems are  positive for none.   All other systems are reviewed and negative.    PHYSICAL EXAM: VS:  BP 128/80 (BP Location: Left Arm, Patient Position: Sitting, Cuff Size: Normal)   Pulse 72   Ht 6\' 3"  (1.905 m)   Wt 186 lb (84.4 kg)   BMI 23.25 kg/m  , BMI Body mass index is 23.25 kg/m. GEN: Well nourished, well developed, in no acute distress  HEENT: normal  Neck: no JVD, carotid bruits, or masses Cardiac: RRR; no murmurs, rubs, or gallops,no edema  Respiratory:  clear to auscultation bilaterally, normal work of breathing GI: soft, nontender, nondistended, + BS MS: no deformity or atrophy  Skin: warm and dry, no rash Neuro:  Strength and sensation are intact Psych:  euthymic mood, full affect   EKG:  EKG is ordered today. The ekg ordered today demonstrates normal sinus rhythm with frequent PVCs, left axis deviation, LVH with repolarization abnormalities.   Recent Labs: 03/22/2018: ALT 29; BUN 29; Creatinine, Ser 1.54; Hemoglobin 16.3; Platelets 146.0; Potassium 4.1; Sodium 139; TSH 0.94    Lipid Panel    Component Value Date/Time   CHOL 188 03/22/2018 1433   TRIG 242.0 (H) 03/22/2018 1433   HDL 43.90 03/22/2018 1433   CHOLHDL 4 03/22/2018 1433   VLDL 48.4 (H) 03/22/2018 1433   LDLDIRECT 98.0 03/22/2018 1433      Wt Readings from Last 3 Encounters:  07/26/18 186 lb (84.4 kg)  06/29/18 187 lb (84.8 kg)  05/03/18 188 lb 12.8 oz (85.6 kg)       No flowsheet data found.    ASSESSMENT AND PLAN:  1.  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  He appears to be euvolemic without any loop diuretics. I elected to increase carvedilol to 6.25 mg twice daily.  Continue treatment with lisinopril for now until we know what his ejection fraction is. I requested an echocardiogram to be done in 2 months.  If EF is less than 40%, I recommend transitioning him to [facility].  He might also require spironolactone but we have to be cautious given underlying chronic kidney disease.  2.  Frequent PVCs: I increased carvedilol as outlined above.  Check ejection fraction by echo.  An ICD should be considered if EF remains below 35% on optimal medical therapy  3.  Essential hypertension: Blood pressure is controlled.    Disposition:   FU with me in 2 months  Signed,  Jonathon Sacramento, MD  07/26/2018 4:56 PM    [place]

## 2018-11-07 DIAGNOSIS — B182 Chronic viral hepatitis C: Secondary | ICD-10-CM | POA: Insufficient documentation

## 2018-11-08 ENCOUNTER — Other Ambulatory Visit: Payer: Self-pay

## 2018-11-08 ENCOUNTER — Telehealth: Payer: Self-pay | Admitting: Family Medicine

## 2018-11-08 ENCOUNTER — Ambulatory Visit (INDEPENDENT_AMBULATORY_CARE_PROVIDER_SITE_OTHER): Payer: 59 | Admitting: Family Medicine

## 2018-11-08 ENCOUNTER — Encounter: Payer: Self-pay | Admitting: Family Medicine

## 2018-11-08 VITALS — BP 120/86 | HR 77 | Temp 96.7°F | Resp 18 | Ht 74.0 in | Wt 187.2 lb

## 2018-11-08 DIAGNOSIS — Z720 Tobacco use: Secondary | ICD-10-CM | POA: Diagnosis not present

## 2018-11-08 DIAGNOSIS — B1920 Unspecified viral hepatitis C without hepatic coma: Secondary | ICD-10-CM | POA: Diagnosis not present

## 2018-11-08 DIAGNOSIS — I5022 Chronic systolic (congestive) heart failure: Secondary | ICD-10-CM | POA: Diagnosis not present

## 2018-11-08 DIAGNOSIS — J449 Chronic obstructive pulmonary disease, unspecified: Secondary | ICD-10-CM

## 2018-11-08 DIAGNOSIS — I10 Essential (primary) hypertension: Secondary | ICD-10-CM

## 2018-11-08 LAB — HEPATIC FUNCTION PANEL
ALT: 17 U/L (ref 0–53)
AST: 15 U/L (ref 0–37)
Albumin: 4 g/dL (ref 3.5–5.2)
Alkaline Phosphatase: 55 U/L (ref 39–117)
Bilirubin, Direct: 0.1 mg/dL (ref 0.0–0.3)
Total Bilirubin: 0.7 mg/dL (ref 0.2–1.2)
Total Protein: 8.4 g/dL — ABNORMAL HIGH (ref 6.0–8.3)

## 2018-11-08 LAB — BASIC METABOLIC PANEL
BUN: 25 mg/dL — ABNORMAL HIGH (ref 6–23)
CO2: 32 mEq/L (ref 19–32)
Calcium: 10 mg/dL (ref 8.4–10.5)
Chloride: 103 mEq/L (ref 96–112)
Creatinine, Ser: 1.56 mg/dL — ABNORMAL HIGH (ref 0.40–1.50)
GFR: 54.14 mL/min — ABNORMAL LOW (ref >60.00)
Glucose, Bld: 74 mg/dL (ref 70–99)
Potassium: 4.1 mEq/L (ref 3.5–5.1)
Sodium: 141 mEq/L (ref 135–145)

## 2018-11-08 MED ORDER — CARVEDILOL 6.25 MG PO TABS: 6.2500 mg | ORAL_TABLET | Freq: Two times a day (BID) | ORAL | 1 refills | Status: DC

## 2018-11-08 MED ORDER — BUDESONIDE-FORMOTEROL FUMARATE 160-4.5 MCG/ACT IN AERO: INHALATION_SPRAY | RESPIRATORY_TRACT | 1 refills | Status: DC

## 2018-11-08 NOTE — Telephone Encounter (Signed)
Patient will be moving to [place] this weekend, would like to be set up with a [facility] office in [place] for continued care  [place]?  Thanks!  LG

## 2018-11-08 NOTE — Progress Notes (Signed)
Subjective:    Patient ID: Jonathon Rios, male    DOB: 28-Oct-1950, 68 y.o.   MRN: [id number]  HPI    Patient presents to clinic for follow-up on blood pressure, heart failure, breathing hepatitis C.  Blood pressure is great on current medications.  His congestive heart failure remained stable.  Currently on Coreg twice daily and lisinopril-hydrochlorothiazide.  Tolerating both these medications well without any adverse side effects.  Breathing remains stable on inhalers.  He continues to smoke about 1/4-1/2 a pack per day.  Trying to smoke less, but is not fully ready to quit completely.  He was seen by gastroenterology for treatment of hepatitis C, since his last lab work in June 2020 has hepatitis C viral load remains undetectable.  He will be moving to [place] this weekend, would like to establish care with 1 of the [facility] primary care doctors in [place] to have continuity of care.  Patient Active Problem List   Diagnosis Date Noted  . History of drug abuse ([place]) 10/17/2017  . HTN (hypertension) 03/16/2017  . Tobacco use 03/16/2017  . Moderate recurrent major depression ([place]) 03/09/2017  . PTSD (post-traumatic stress disorder) 03/09/2017  . Cocaine abuse ([place]) 03/09/2017  . CHF (congestive heart failure) ([place]) 03/07/2017  . Acute CHF (congestive heart failure) ([place]) 01/02/2017   Social History   Tobacco Use  . Smoking status: Current Some Day Smoker    Packs/day: 1.00    Years: 50.00    Pack years: 50.00    Types: Cigarettes    Last attempt to quit: 02/23/2017    Years since quitting: 1.7  . Smokeless tobacco: Never Used  Substance Use Topics  . Alcohol use: No    Frequency: Never   Review of Systems   Constitutional: Negative for chills, fatigue and fever.  HENT: Negative for congestion, ear pain, sinus pain and sore throat.   Eyes: Negative.   Respiratory: Negative for cough, shortness of breath and wheezing.   Cardiovascular: Negative for chest pain,  palpitations and leg swelling.  Gastrointestinal: Negative for abdominal pain, diarrhea, nausea and vomiting.  Genitourinary: Negative for dysuria, frequency and urgency.  Musculoskeletal: Negative for arthralgias and myalgias.  Skin: Negative for color change, pallor and rash.  Neurological: Negative for syncope, light-headedness and headaches.  Psychiatric/Behavioral: The patient is not nervous/anxious.       Objective:   Physical Exam Vitals signs and nursing note reviewed.  Constitutional:      General: He is not in acute distress.    Appearance: He is not ill-appearing, toxic-appearing or diaphoretic.  HENT:     Head: Normocephalic and atraumatic.  Eyes:     General: No scleral icterus.    Extraocular Movements: Extraocular movements intact.     Pupils: Pupils are equal, round, and reactive to light.  Neck:     Musculoskeletal: Normal range of motion and neck supple. No neck rigidity.     Vascular: No carotid bruit.  Cardiovascular:     Rate and Rhythm: Normal rate and regular rhythm.     Heart sounds: Normal heart sounds.  Pulmonary:     Effort: Pulmonary effort is normal. No respiratory distress.     Breath sounds: Normal breath sounds.  Abdominal:     General: Bowel sounds are normal.     Palpations: Abdomen is soft.  Musculoskeletal:     Right lower leg: No edema.     Left lower leg: No edema.  Skin:    General: Skin  is warm and dry.     Coloration: Skin is not jaundiced or pale.  Neurological:     General: No focal deficit present.     Mental Status: He is alert and oriented to person, place, and time.     Gait: Gait normal.  Psychiatric:        Mood and Affect: Mood normal.        Behavior: Behavior normal.        Thought Content: Thought content normal.        Judgment: Judgment normal.    Today's Vitals   11/08/18 1311  BP: 120/86  Pulse: 77  Resp: 18  Temp: (!) 96.7 F (35.9 C)  TempSrc: Temporal  SpO2: 98%  Weight: 187 lb 3.2 oz (84.9 kg)   Height: 6\' 2"  (1.88 m)   Body mass index is 24.04 kg/m.    Assessment & Plan:    1. Chronic systolic congestive heart failure (HCC) Stable. Follows with cardiology.  - Basic metabolic panel - Hepatic function panel - carvedilol (COREG) 6.25 MG tablet; Take 1 tablet (6.25 mg total) by mouth 2 (two) times daily with a meal.  Dispense: 180 tablet; Refill: 1  2. Essential hypertension BP stable. Continue current meds.  - Basic metabolic panel - Hepatic function panel - carvedilol (COREG) 6.25 MG tablet; Take 1 tablet (6.25 mg total) by mouth 2 (two) times daily with a meal.  Dispense: 180 tablet; Refill: 1  3. Hepatitis C virus infection without hepatic coma, unspecified chronicity Treated by GI, viral load undetectable  4. Tobacco use Greater than 3 minutes spent discussing smoking cessation and risks of continued smoking.  Patient not ready to quit at this time.  Understands risks of continued tobacco/cigarette use.  States he will consider cutting back on his own and let us know if he wants to do any sort of medication assisted smoking cessation program.  5. Chronic obstructive pulmonary disease, unspecified COPD type ([place])  Breathing stable. Continue current inhalers.   We will reach out to coordinators to get patient set up with a provider in the [facility] clinic.  Patient aware he can call office anytime with questions or concerns.

## 2018-11-09 NOTE — Telephone Encounter (Signed)
Tried to reach pt to schedule transfer over to [place].  Phone rung.  No VM.

## 2018-11-21 ENCOUNTER — Encounter: Payer: Self-pay | Admitting: Family

## 2018-11-21 ENCOUNTER — Other Ambulatory Visit: Payer: Self-pay

## 2018-11-21 ENCOUNTER — Ambulatory Visit (INDEPENDENT_AMBULATORY_CARE_PROVIDER_SITE_OTHER): Payer: 59 | Admitting: Family

## 2018-11-21 VITALS — BP 126/84 | HR 96 | Temp 98.2°F | Ht 74.0 in | Wt 186.6 lb

## 2018-11-21 DIAGNOSIS — I5022 Chronic systolic (congestive) heart failure: Secondary | ICD-10-CM | POA: Diagnosis not present

## 2018-11-21 DIAGNOSIS — F4310 Post-traumatic stress disorder, unspecified: Secondary | ICD-10-CM

## 2018-11-21 DIAGNOSIS — Z8619 Personal history of other infectious and parasitic diseases: Secondary | ICD-10-CM

## 2018-11-21 NOTE — Progress Notes (Signed)
Jonathon Rios is a 68 y.o. male with the following history as recorded in EpicCare:  Patient Active Problem List   Diagnosis Date Noted  . History of drug abuse ([place]) 10/17/2017  . HTN (hypertension) 03/16/2017  . Tobacco use 03/16/2017  . Moderate recurrent major depression ([place]) 03/09/2017  . PTSD (post-traumatic stress disorder) 03/09/2017  . Cocaine abuse ([place]) 03/09/2017  . CHF (congestive heart failure) ([place]) 03/07/2017  . Acute CHF (congestive heart failure) ([place]) 01/02/2017    Current Outpatient Medications  Medication Sig Dispense Refill  . albuterol (PROAIR HFA) 108 (90 Base) MCG/ACT inhaler Inhale 2 puffs into the lungs every 6 (six) hours as needed for wheezing or shortness of breath. 1 Inhaler 5  . aspirin EC 81 MG tablet Take 1 tablet (81 mg total) by mouth daily.    . budesonide-formoterol (SYMBICORT) 160-4.5 MCG/ACT inhaler TAKE 2 PUFFS BY MOUTH TWICE A DAY 30.6 Inhaler 1  . carvedilol (COREG) 6.25 MG tablet Take 1 tablet (6.25 mg total) by mouth 2 (two) times daily with a meal. 180 tablet 1  . lisinopril-hydrochlorothiazide (PRINZIDE,ZESTORETIC) 20-12.5 MG tablet Take 1 tablet by mouth daily. 90 tablet 3   No current facility-administered medications for this visit.     Allergies: Penicillins  Past Medical History:  Diagnosis Date  . Alcoholism and drug addiction in family   . Arthritis   . CHF (congestive heart failure) ([place])   . Coronary artery disease   . Heart disease   . Hypertension   . Irregular heart beat   . Positive TB test     Past Surgical History:  Procedure Laterality Date  . CARDIAC CATHETERIZATION    . TONSILLECTOMY      Family History  Problem Relation Age of Onset  . Hypertension Mother   . Diabetes Mother   . Alcohol abuse Mother   . Hyperlipidemia Mother   . Hypertension Sister   . Alcohol abuse Sister   . Asthma Sister   . Depression Sister   . Drug abuse Sister   . Alcohol abuse Father   . Hypertension Father   .  Hypertension Daughter   . Early death Daughter   . Drug abuse Daughter   . Depression Daughter   . Alcohol abuse Daughter     Social History   Tobacco Use  . Smoking status: Current Some Day Smoker    Packs/day: 1.00    Years: 50.00    Pack years: 50.00    Types: Cigarettes    Last attempt to quit: 02/23/2017    Years since quitting: 1.7  . Smokeless tobacco: Never Used  Substance Use Topics  . Alcohol use: No    Frequency: Never    Subjective:  Patient has recently moved to [facility] from [place]; transferring within [place]; doing very well- needs to get his specialists changed to [facility] as well; Knows he needs to quit smoking;  Denies any chest pain on exertion, blurred vision or headache.   Objective:  Vitals:   11/21/18 1035  BP: 126/84  Pulse: 96  Temp: 98.2 F (36.8 C)  TempSrc: Oral  SpO2: 99%  Weight: 186 lb 9.6 oz (84.6 kg)  Height: 6\' 2"  (1.88 m)    General: Well developed, well nourished, in no acute distress  Skin : Warm and dry.  Head: Normocephalic and atraumatic  Lungs: Respirations unlabored; clear to auscultation bilaterally without wheeze, rales, rhonchi  CVS exam: normal rate and regular rhythm.  Neurologic: Alert and oriented; speech  intact; face symmetrical; moves all extremities well; CNII-XII intact without focal deficit  Assessment:  1. Chronic systolic congestive heart failure ([place])   2. History of hepatitis C virus infection   3. PTSD (post-traumatic stress disorder)     Plan:  1. Refer to cardiology; in reviewing notes, consideration to change to [facility]; follow-up as scheduled with cardiology; 2. Will refer to GI- patient appears to be in maintenance pattern; recent LFTs earlier this month were normal. 3. Refer to behavioral health per patient request;  Patient defers flu shot today; follow-up here in 6 months, sooner prn.   No follow-ups on file.  Orders Placed This Encounter  Procedures  . Ambulatory referral to  Cardiology    Referral Priority:   Routine    Referral Type:   Consultation    Referral Reason:   Specialty Services Required    Requested Specialty:   Cardiology    Number of Visits Requested:   1  . Ambulatory referral to Gastroenterology    Referral Priority:   Routine    Referral Type:   Consultation    Referral Reason:   Specialty Services Required    Number of Visits Requested:   1  . Ambulatory referral to Psychology    Referral Priority:   Routine    Referral Type:   Psychiatric    Referral Reason:   Specialty Services Required    Requested Specialty:   Psychology    Number of Visits Requested:   1    Requested Prescriptions    No prescriptions requested or ordered in this encounter

## 2018-11-23 ENCOUNTER — Other Ambulatory Visit: Payer: Self-pay | Admitting: Family

## 2018-11-23 ENCOUNTER — Encounter: Payer: Self-pay | Admitting: Lab

## 2018-11-23 DIAGNOSIS — Z8619 Personal history of other infectious and parasitic diseases: Secondary | ICD-10-CM

## 2018-12-26 ENCOUNTER — Ambulatory Visit (INDEPENDENT_AMBULATORY_CARE_PROVIDER_SITE_OTHER): Payer: 59 | Admitting: Psychology

## 2018-12-26 DIAGNOSIS — F322 Major depressive disorder, single episode, severe without psychotic features: Secondary | ICD-10-CM | POA: Diagnosis not present

## 2019-01-09 ENCOUNTER — Ambulatory Visit (INDEPENDENT_AMBULATORY_CARE_PROVIDER_SITE_OTHER): Payer: 59 | Admitting: Psychology

## 2019-01-09 DIAGNOSIS — F322 Major depressive disorder, single episode, severe without psychotic features: Secondary | ICD-10-CM

## 2019-01-15 ENCOUNTER — Encounter: Payer: Self-pay | Admitting: Family

## 2019-04-04 ENCOUNTER — Other Ambulatory Visit: Payer: Self-pay

## 2019-04-04 ENCOUNTER — Ambulatory Visit (INDEPENDENT_AMBULATORY_CARE_PROVIDER_SITE_OTHER): Payer: 59

## 2019-04-04 DIAGNOSIS — I5022 Chronic systolic (congestive) heart failure: Secondary | ICD-10-CM

## 2019-04-08 ENCOUNTER — Telehealth: Payer: Self-pay | Admitting: Family

## 2019-04-08 DIAGNOSIS — I10 Essential (primary) hypertension: Secondary | ICD-10-CM

## 2019-04-08 DIAGNOSIS — I5020 Unspecified systolic (congestive) heart failure: Secondary | ICD-10-CM

## 2019-04-08 MED ORDER — LISINOPRIL-HYDROCHLOROTHIAZIDE 20-12.5 MG PO TABS: 1.0000 | ORAL_TABLET | Freq: Every day | ORAL | 0 refills | Status: DC

## 2019-04-08 NOTE — Telephone Encounter (Signed)
im okay too.   Jonathon Rios Would you call and sch f/u?  I refilled zestoretic   we will also order labs during visit. Please ensure he has BP, HR log with him during our appt Med appears refilled 1/

## 2019-04-08 NOTE — Telephone Encounter (Signed)
I am fine with this.

## 2019-04-08 NOTE — Telephone Encounter (Signed)
Pt came into our office and said he needed a refill on Carvedilol/lisinopril prescriptions. I informed him his provider is Jodi Mourning and she is located at [facility]. He said that is too far from where he live and wanted to switch back to our office since he previously saw Lauren Guse. I told him I would ask both providers to see if you they agree for a TOC to [place]?

## 2019-04-09 ENCOUNTER — Telehealth: Payer: Self-pay

## 2019-04-09 ENCOUNTER — Ambulatory Visit (INDEPENDENT_AMBULATORY_CARE_PROVIDER_SITE_OTHER): Payer: 59 | Admitting: Cardiovascular Disease

## 2019-04-09 ENCOUNTER — Other Ambulatory Visit: Payer: Self-pay

## 2019-04-09 ENCOUNTER — Encounter: Payer: Self-pay | Admitting: Cardiovascular Disease

## 2019-04-09 VITALS — BP 160/100 | HR 74 | Ht 75.0 in | Wt 190.5 lb

## 2019-04-09 DIAGNOSIS — I493 Ventricular premature depolarization: Secondary | ICD-10-CM

## 2019-04-09 DIAGNOSIS — I5022 Chronic systolic (congestive) heart failure: Secondary | ICD-10-CM | POA: Diagnosis not present

## 2019-04-09 DIAGNOSIS — I10 Essential (primary) hypertension: Secondary | ICD-10-CM | POA: Diagnosis not present

## 2019-04-09 MED ORDER — ENTRESTO 49-51 MG PO TABS: 1.0000 | ORAL_TABLET | Freq: Two times a day (BID) | ORAL | 5 refills | Status: DC

## 2019-04-09 NOTE — Telephone Encounter (Signed)
Patient returning calling to discuss recent testing results

## 2019-04-09 NOTE — Telephone Encounter (Signed)
-----   Message from Wellington Hampshire, MD sent at 04/08/2019  2:33 PM EST ----- Inform patient that echo showed that his ejection fraction is still severely reduced at 25 to 30%.  Please schedule him for a follow-up visit to discuss treatment options and switching lisinopril to Entresto.

## 2019-04-09 NOTE — Telephone Encounter (Signed)
Patient made aware of echo results with verbalized understanding. Appt scheduled for today 04/09/19 @ 4:20 pm. Patient aware of the appt date and time. Patient denies COVID like symptoms. Patient adv to arrive 20 min prior to scheduled appt.

## 2019-04-09 NOTE — Telephone Encounter (Signed)
Patient has been scheduled with margaret in [place]

## 2019-04-09 NOTE — Telephone Encounter (Signed)
Called to give the patient echo results and Dr. Tyrell Antonio recommendation. [email or web address].

## 2019-04-09 NOTE — Patient Instructions (Signed)
Medication Instructions:  Your physician has recommended you make the following change in your medication:   1) STOP Lisinopril HCTZ  2) On Thurs 04/11/19 START Entresto 49-51 mg twice daily. An Rx has been sent to your pharmacy  *If you need a refill on your cardiac medications before your next appointment, please call your pharmacy*  Lab Work: Your physician recommends that you return for lab work 1 week after starting [employer].  Please have your lab drawn (bmet) at the [facility] medical mall. You do not need an appointment. Their hours are Mon- Fri 7am-6pm  If you have labs (blood work) drawn today and your tests are completely normal, you will receive your results only by: [name] MyChart Message (if you have MyChart) OR . A paper copy in the mail If you have any lab test that is abnormal or we need to change your treatment, we will call you to review the results.  Testing/Procedures: None ordered  Follow-Up: At [facility], you and your health needs are our priority.  As part of our continuing mission to provide you with exceptional heart care, we have created designated Provider Care Teams.  These Care Teams include your primary Cardiologist (physician) and Advanced Practice Providers (APPs -  Physician Assistants and Nurse Practitioners) who all work together to provide you with the care you need, when you need it.  Your next appointment:   2 month(s)  The format for your next appointment:   In Person  Provider:    You may see  Dr. Fletcher Anon or one of the following Advanced Practice Providers on your designated Care Team:    Murray Hodgkins, NP  [name], PA-C  Marrianne Mood, PA-C   Other Instructions N/A

## 2019-04-09 NOTE — Progress Notes (Signed)
Cardiology Office Note   Date:  04/09/2019   ID:  Jonathon, Rios 1950/10/02, MRN [id number]  PCP:  Jonathon Rios, [place]  Cardiologist:   [name], MD   Chief Complaint  Patient presents with  . other    Follow up post ECHO. Meds reviewed verbally with patient.       History of Present Illness: Jonathon Rios is a 69 y.o. male who is here today for follow-up visit regarding chronic systolic heart failure due to hypertensive heart disease.   He has known history of chronic systolic heart failure, mild chronic kidney disease, hepatitis C, essential hypertension, tobacco use, excessive alcohol use and previous drug use.  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  He has been incarcerated a few times.  He attended drug rehab and has been drug-free for 2 years.  He underwent treatment for hepatitis C and he was cured.  He was diagnosed with systolic heart failure years ago when he lived in [place].  He had cardiac catheterization done at that time and was told there was no significant blockages. Echocardiogram in November 2018 showed an EF of 25 to 30%.  He continues to smoke but cut down to a few cigarettes a day.   He has been doing well with no recent chest pain, shortness of breath or orthopnea.  No significant leg edema.  He had a repeat echocardiogram done recently which showed an EF of 25 to 30%.  The aortic root was mildly dilated at 4 cm.  Past Medical History:  Diagnosis Date  . Alcoholism and drug addiction in family   . Arthritis   . CHF (congestive heart failure) ([place])   . Coronary artery disease   . Heart disease   . Hypertension   . Irregular heart beat   . Positive TB test     Past Surgical History:  Procedure Laterality Date  . CARDIAC CATHETERIZATION    . TONSILLECTOMY       Current Outpatient Medications  Medication Sig Dispense Refill  . albuterol (PROAIR HFA) 108 (90 Base) MCG/ACT inhaler Inhale  2 puffs into the lungs every 6 (six) hours as needed for wheezing or shortness of breath. 1 Inhaler 5  . aspirin EC 81 MG tablet Take 1 tablet (81 mg total) by mouth daily.    . budesonide-formoterol (SYMBICORT) 160-4.5 MCG/ACT inhaler TAKE 2 PUFFS BY MOUTH TWICE A DAY 30.6 Inhaler 1  . carvedilol (COREG) 6.25 MG tablet Take 1 tablet (6.25 mg total) by mouth 2 (two) times daily with a meal. 180 tablet 1  . lisinopril-hydrochlorothiazide (ZESTORETIC) 20-12.5 MG tablet Take 1 tablet by mouth daily. 90 tablet 0   No current facility-administered medications for this visit.    Allergies:   Penicillins    Social History:  The patient  reports that he has been smoking cigarettes. He has a 50.00 pack-year smoking history. He has never used smokeless tobacco. He reports current drug use. Drugs: "Crack" cocaine, Heroin, Marijuana, and LSD. He reports that he does not drink alcohol.   Family History:  The patient's family history includes Alcohol abuse in his daughter, father, mother, and sister; Asthma in his sister; Depression in his daughter and sister; Diabetes in his mother; Drug abuse in his daughter and sister; Early death in his daughter; Hyperlipidemia in his mother; Hypertension in his daughter, father, mother, and sister.    ROS:  Please see the  history of present illness.   Otherwise, review of systems are positive for none.   All other systems are reviewed and negative.    PHYSICAL EXAM: VS:  BP (!) 160/100 (BP Location: Left Arm, Patient Position: Sitting, Cuff Size: Normal)   Pulse 74   Ht 6\' 3"  (1.905 m)   Wt 190 lb 8 oz (86.4 kg)   BMI 23.81 kg/m  , BMI Body mass index is 23.81 kg/m. GEN: Well nourished, well developed, in no acute distress  HEENT: normal  Neck: no JVD, carotid bruits, or masses Cardiac: RRR; no murmurs, rubs, or gallops,no edema  Respiratory:  clear to auscultation bilaterally, normal work of breathing GI: soft, nontender, nondistended, + BS MS: no deformity  or atrophy  Skin: warm and dry, no rash Neuro:  Strength and sensation are intact Psych: euthymic mood, full affect   EKG:  EKG is ordered today. The ekg ordered today demonstrates normal sinus rhythm with LVH with repolarization abnormalities.   Recent Labs: 11/08/2018: ALT 17; BUN 25; Creatinine, Ser 1.56; Potassium 4.1; Sodium 141    Lipid Panel    Component Value Date/Time   CHOL 188 03/22/2018 1433   TRIG 242.0 (H) 03/22/2018 1433   HDL 43.90 03/22/2018 1433   CHOLHDL 4 03/22/2018 1433   VLDL 48.4 (H) 03/22/2018 1433   LDLDIRECT 98.0 03/22/2018 1433      Wt Readings from Last 3 Encounters:  04/09/19 190 lb 8 oz (86.4 kg)  11/21/18 186 lb 9.6 oz (84.6 kg)  11/08/18 187 lb 3.2 oz (84.9 kg)       No flowsheet data found.    ASSESSMENT AND PLAN:  1.  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  He appears to be euvolemic without any loop diuretics. Recent ejection fraction was 25 to 30% in spite of treatment with lisinopril and carvedilol.  Thus, I elected to switch him from lisinopril-hydrochlorothiazide to [place].  Check basic metabolic profile 1 week after the switch.  Heart failure medications should be uptitrated gradually.  Spironolactone should be considered if renal function allows given that he does have underlying chronic kidney disease.  2.  Frequent PVCs: Seems to be more controlled after increasing carvedilol.  3.  Essential hypertension: Blood pressure is elevated.  Lisinopril will be switched to [facility].    Disposition:   FU with me in 2 months  Signed,  [name], MD  04/09/2019 4:39 PM    [place]

## 2019-05-13 ENCOUNTER — Other Ambulatory Visit: Payer: Self-pay

## 2019-05-13 DIAGNOSIS — I5022 Chronic systolic (congestive) heart failure: Secondary | ICD-10-CM

## 2019-05-13 DIAGNOSIS — I10 Essential (primary) hypertension: Secondary | ICD-10-CM

## 2019-05-13 MED ORDER — CARVEDILOL 6.25 MG PO TABS: 6.2500 mg | ORAL_TABLET | Freq: Two times a day (BID) | ORAL | 0 refills | Status: DC

## 2019-05-13 NOTE — Telephone Encounter (Signed)
*  STAT* If patient is at the pharmacy, call can be transferred to refill team.   1. Which medications need to be refilled? (please list name of each medication and dose if known) Carvedilol  2. Which pharmacy/location (including street and city if local pharmacy) is medication to be sent to? [facility] [address]  3. Do they need a 30 day or 90 day supply? [place]

## 2019-05-13 NOTE — Telephone Encounter (Signed)
Please advise if ok to refill Carvedilol 6.25 mg tablet bid.  Last filled by Philis Nettle, FNP

## 2019-05-15 ENCOUNTER — Ambulatory Visit (INDEPENDENT_AMBULATORY_CARE_PROVIDER_SITE_OTHER): Payer: 59 | Admitting: Family

## 2019-05-15 ENCOUNTER — Encounter: Payer: Self-pay | Admitting: Family

## 2019-05-15 ENCOUNTER — Other Ambulatory Visit: Payer: Self-pay

## 2019-05-15 DIAGNOSIS — I10 Essential (primary) hypertension: Secondary | ICD-10-CM

## 2019-05-15 DIAGNOSIS — B354 Tinea corporis: Secondary | ICD-10-CM | POA: Diagnosis not present

## 2019-05-15 DIAGNOSIS — I5022 Chronic systolic (congestive) heart failure: Secondary | ICD-10-CM | POA: Diagnosis not present

## 2019-05-15 MED ORDER — CLOTRIMAZOLE 1 % EX OINT: 1.0000 "application " | TOPICAL_OINTMENT | Freq: Two times a day (BID) | CUTANEOUS | 1 refills | Status: DC

## 2019-05-15 NOTE — Assessment & Plan Note (Signed)
Suspected tinea.  We will start with topical antifungal.  Patient will know if no improvement.  May also try topical steroid for atopic dermatitis.

## 2019-05-15 NOTE — Assessment & Plan Note (Signed)
Elevated today.  Have reached out to cardiologist, Dr.Arida in regards to increasing Coreg.  Meanwhile, I have advised patient monitor blood pressure at home to see if this is persistent.

## 2019-05-15 NOTE — Patient Instructions (Signed)
I suspect that your rash is related to tinea corporis.  Information below.  I would like for you to  trial clomitrazole for 2 weeks.  If this medication does not work, let me know I will then send in a steroid topical if you can try for suspected eczema.   I am going to recheck Dr Fletcher Anon in regards to elevated blood pressure to see what changes he thinks are appropriate.  Please monitor your blood pressure at home with a goal being less than 130/80.  Stay safe - AND pleasure pleasure meeting you today.   Body Ringworm Body ringworm is an infection of the skin that often causes a ring-shaped rash. Body ringworm is also called tinea corporis. Body ringworm can affect any part of your skin. This condition is easily spread from person to person (is very contagious). What are the causes? This condition is caused by fungi called dermatophytes. The condition develops when these fungi grow out of control on the skin. You can get this condition if you touch a person or animal that has it. You can also get it if you share any items with an infected person or pet. These include:  Clothing, bedding, and towels.  Brushes or combs.  Gym equipment.  Any other object that has the fungus on it. What increases the risk? You are more likely to develop this condition if you:  Play sports that involve close physical contact, such as wrestling.  Sweat a lot.  Live in areas that are hot and humid.  Use public showers.  Have a weakened immune system. What are the signs or symptoms? Symptoms of this condition include:  Itchy, raised red spots and bumps.  Red scaly patches.  A ring-shaped rash. The rash may have: ? A clear center. ? Scales or red bumps at its center. ? Redness near its borders. ? Dry and scaly skin on or around it. How is this diagnosed? This condition can usually be diagnosed with a skin exam. A skin scraping may be taken from the affected area and examined under a microscope  to see if the fungus is present. How is this treated? This condition may be treated with:  An antifungal cream or ointment.  An antifungal shampoo.  Antifungal medicines. These may be prescribed if your ringworm: ? Is severe. ? Keeps coming back. ? Lasts a long time. Follow these instructions at home:  Take over-the-counter and prescription medicines only as told by your health care provider.  If you were given an antifungal cream or ointment: ? Use it as told by your health care provider. ? Wash the infected area and dry it completely before applying the cream or ointment.  If you were given an antifungal shampoo: ? Use it as told by your health care provider. ? Leave the shampoo on your body for 3-5 minutes before rinsing.  While you have a rash: ? Wear loose clothing to stop clothes from rubbing and irritating it. ? Wash or change your bed sheets every night. ? Disinfect or throw out items that may be infected. ? Wash clothes and bed sheets in hot water. ? Wash your hands often with soap and water. If soap and water are not available, use hand sanitizer.  If your pet has the same infection, take your pet to see a veterinarian for treatment. How is this prevented?  Take a bath or shower every day and after every time you work out or play sports.  Dry your skin  completely after bathing.  Wear sandals or shoes in public places and showers.  Change your clothes every day.  Wash athletic clothes after each use.  Do not share personal items with others.  Avoid touching red patches of skin on other people.  Avoid touching pets that have bald spots.  If you touch an animal that has a bald spot, wash your hands. Contact a health care provider if:  Your rash continues to spread after 7 days of treatment.  Your rash is not gone in 4 weeks.  The area around your rash gets red, warm, tender, and swollen. Summary  Body ringworm is an infection of the skin that often  causes a ring-shaped rash.  This condition is easily spread from person to person (is very contagious).  This condition may be treated with antifungal cream or ointment, antifungal shampoo, or antifungal medicines.  Take over-the-counter and prescription medicines only as told by your health care provider. This information is not intended to replace advice given to you by your health care provider. Make sure you discuss any questions you have with your health care provider. Document Revised: 10/13/2017 Document Reviewed: 10/13/2017 Elsevier Patient Education  [employer].

## 2019-05-15 NOTE — Progress Notes (Signed)
Subjective:    Patient ID: Jonathon Rios, male    DOB: [birth year], 69 y.o.   MRN: [id number]  CC: Jonathon Rios is a 69 y.o. male who presents today to establish care.    HPI: Feels well today.   Starting new life. Happiest he has ever been . In a great relationship. Sober for 3 years.   Complains of rash noticed on his bl upper arms for past month, also noticed between breasts. Pruritic.  Using salve, dove soap.  Stopped using oils on his skin however puts oil on his clothes. No new detergents.   HTN- smoked cigarrete this morning on way to his appointment.   Heart failure- compliant with entresto.  Denies leg swelling, orthopnea, chest pain, palpitations.  No depression.  no si/hi H/o drug free for 2 years From [place] Dr Fletcher Anon- 04/09/19- recent EF 25-30% Follow up cardiology 06/11/19. Started on entresto 6 weeks ago.   HISTORY:  Past Medical History:  Diagnosis Date  . Alcoholism and drug addiction in family   . Arthritis   . CHF (congestive heart failure) ([place])   . Coronary artery disease   . Heart disease   . Hypertension   . Irregular heart beat   . Positive TB test    Past Surgical History:  Procedure Laterality Date  . CARDIAC CATHETERIZATION    . TONSILLECTOMY     Family History  Problem Relation Age of Onset  . Hypertension Mother   . Diabetes Mother   . Alcohol abuse Mother   . Hyperlipidemia Mother   . Hypertension Sister   . Alcohol abuse Sister   . Asthma Sister   . Depression Sister   . Drug abuse Sister   . Alcohol abuse Father   . Hypertension Father   . Hypertension Daughter   . Early death Daughter   . Drug abuse Daughter   . Depression Daughter   . Alcohol abuse Daughter     Allergies: Penicillins Current Outpatient Medications on File Prior to Visit  Medication Sig Dispense Refill  . albuterol (PROAIR HFA) 108 (90 Base) MCG/ACT inhaler Inhale 2 puffs into the lungs every 6 (six) hours as needed for wheezing or shortness of  breath. 1 Inhaler 5  . aspirin EC 81 MG tablet Take 1 tablet (81 mg total) by mouth daily.    . budesonide-formoterol (SYMBICORT) 160-4.5 MCG/ACT inhaler TAKE 2 PUFFS BY MOUTH TWICE A DAY 30.6 Inhaler 1  . carvedilol (COREG) 6.25 MG tablet Take 1 tablet (6.25 mg total) by mouth 2 (two) times daily with a meal. 180 tablet 0  . sacubitril-valsartan (ENTRESTO) 49-51 MG Take 1 tablet by mouth 2 (two) times daily. 60 tablet 5   No current facility-administered medications on file prior to visit.    Social History   Tobacco Use  . Smoking status: Current Some Day Smoker    Packs/day: 1.00    Years: 50.00    Pack years: 50.00    Types: Cigarettes    Last attempt to quit: 02/23/2017    Years since quitting: 2.2  . Smokeless tobacco: Never Used  Substance Use Topics  . Alcohol use: No  . Drug use: Not Currently    Types: "Crack" cocaine, Heroin, Marijuana, LSD    Comment: Sober since 2018.     Review of Systems  Constitutional: Negative for chills and fever.  Respiratory: Negative for cough and shortness of breath.   Cardiovascular: Negative for chest pain, palpitations and leg swelling.  Gastrointestinal: Negative for nausea and vomiting.  Skin: Positive for rash.      Objective:    BP (!) 142/96 (BP Location: Left Arm, Cuff Size: Large)   Pulse 77   Temp (!) 95.8 F (35.4 C) (Temporal)   Ht 6' 1.95" (1.878 m)   Wt 188 lb 6.4 oz (85.5 kg)   SpO2 99%   BMI 24.22 kg/m  BP Readings from Last 3 Encounters:  05/15/19 (!) 142/96  04/09/19 (!) 160/100  11/21/18 126/84   Wt Readings from Last 3 Encounters:  05/15/19 188 lb 6.4 oz (85.5 kg)  04/09/19 190 lb 8 oz (86.4 kg)  11/21/18 186 lb 9.6 oz (84.6 kg)    Physical Exam Vitals reviewed. Exam conducted with a chaperone present.  Constitutional:      Appearance: He is well-developed.  Cardiovascular:     Rate and Rhythm: Regular rhythm.     Heart sounds: Normal heart sounds.  Pulmonary:     Effort: Pulmonary effort is  normal. No respiratory distress.     Breath sounds: Normal breath sounds. No wheezing, rhonchi or rales.  Skin:    General: Skin is warm and dry.     Comments: Areas of hyperpigmented raised patches noted bilateral forearms and also in between breasts.  No papules, purulent discharge, erythema.  Patches are scaly, slightly excoriated.  Neurological:     Mental Status: He is alert.  Psychiatric:        Speech: Speech normal.        Behavior: Behavior normal.        Assessment & Plan:   Problem List Items Addressed This Visit      Cardiovascular and Mediastinum   CHF (congestive heart failure) ([place])    Appears euvolemic today.  Will follow      HTN (hypertension) (Chronic)    Elevated today.  Have reached out to cardiologist, Dr.Arida in regards to increasing Coreg.  Meanwhile, I have advised patient monitor blood pressure at home to see if this is persistent.        Musculoskeletal and Integument   Tinea corporis    Suspected tinea.  We will start with topical antifungal.  Patient will know if no improvement.  May also try topical steroid for atopic dermatitis.      Relevant Medications   Clotrimazole 1 % OINT       I am having Jhan A. Rigdon start on Clotrimazole. I am also having him maintain his albuterol, aspirin EC, budesonide-formoterol, Entresto, and carvedilol.   Meds ordered this encounter  Medications  . Clotrimazole 1 % OINT    Sig: Apply 1 application topically 2 (two) times daily for 14 days.    Dispense:  56.7 g    Refill:  1    Order Specific Question:   Supervising Provider    Answer:   Crecencio Mc [[id number]]    Return precautions given.   Risks, benefits, and alternatives of the medications and treatment plan prescribed today were discussed, and patient expressed understanding.   Education regarding symptom management and diagnosis given to patient on AVS.  Continue to follow with [name], FNP for routine health maintenance.    [name] and I agreed with plan.   Mable Paris, FNP

## 2019-05-15 NOTE — Assessment & Plan Note (Signed)
Appears euvolemic today.  Will follow

## 2019-05-15 NOTE — Progress Notes (Signed)
LMTCB

## 2019-05-17 ENCOUNTER — Telehealth: Payer: Self-pay | Admitting: Family

## 2019-05-17 DIAGNOSIS — B354 Tinea corporis: Secondary | ICD-10-CM

## 2019-05-17 MED ORDER — CLOTRIMAZOLE 1 % EX CREA: 1.0000 "application " | TOPICAL_CREAM | Freq: Two times a day (BID) | CUTANEOUS | 2 refills | Status: DC

## 2019-05-17 NOTE — Telephone Encounter (Signed)
[facility]ios with [facility] called and would like the Clotrimazole 1 % OINT changed to a CREAM. They can no longer get the ointment.

## 2019-05-17 NOTE — Telephone Encounter (Signed)
Can this please be changed for patient?

## 2019-05-17 NOTE — Telephone Encounter (Signed)
Done

## 2019-05-21 ENCOUNTER — Ambulatory Visit: Payer: 59 | Admitting: Family

## 2019-05-21 ENCOUNTER — Telehealth: Payer: Self-pay | Admitting: Family

## 2019-05-21 NOTE — Telephone Encounter (Signed)
Call pt  He was to provide me with blood pressure, heart rate readings.  I would like to see the trend here as it was elevated in the office. Please see if he has.   Per conversation with Dr Fletcher Anon ( copied below) , if continues to be elevated, we can increase the Coreg    Arida, Mertie Clause, MD  [name], [place],  I agree with increasing Coreg to 12.5 mg twice daily. Thanks

## 2019-05-24 NOTE — Telephone Encounter (Signed)
LMTCB

## 2019-05-28 NOTE — Telephone Encounter (Signed)
When I called patient he was VERY short with me & sounded like her was busy. He said that he had been checking BP & that he was "fine". I asked what his last reading was & he said that he didn't remember, but he was fine. Then stated that he had been doing this for years & he would know if she wasn't. He has been taking the Carvedilol 12.5mg  twice a day. I reminded him of follow-up on 06/17/19.

## 2019-05-29 NOTE — Telephone Encounter (Signed)
noted 

## 2019-06-11 ENCOUNTER — Other Ambulatory Visit
Admission: RE | Admit: 2019-06-11 | Discharge: 2019-06-11 | Disposition: A | Discharge status: Home / Self Care | Payer: 59 | Source: Ambulatory Visit | Attending: Physician Assistant | Admitting: Physician Assistant

## 2019-06-11 ENCOUNTER — Encounter: Payer: Self-pay | Admitting: Physician Assistant

## 2019-06-11 ENCOUNTER — Ambulatory Visit (INDEPENDENT_AMBULATORY_CARE_PROVIDER_SITE_OTHER): Payer: 59 | Admitting: Physician Assistant

## 2019-06-11 ENCOUNTER — Other Ambulatory Visit: Payer: Self-pay

## 2019-06-11 VITALS — BP 138/100 | HR 77 | Ht 74.0 in | Wt 188.0 lb

## 2019-06-11 DIAGNOSIS — I10 Essential (primary) hypertension: Secondary | ICD-10-CM

## 2019-06-11 DIAGNOSIS — Z720 Tobacco use: Secondary | ICD-10-CM

## 2019-06-11 DIAGNOSIS — F1911 Other psychoactive substance abuse, in remission: Secondary | ICD-10-CM

## 2019-06-11 DIAGNOSIS — I5022 Chronic systolic (congestive) heart failure: Secondary | ICD-10-CM | POA: Diagnosis not present

## 2019-06-11 DIAGNOSIS — I130 Hypertensive heart and chronic kidney disease with heart failure and stage 1 through stage 4 chronic kidney disease, or unspecified chronic kidney disease: Secondary | ICD-10-CM

## 2019-06-11 LAB — BASIC METABOLIC PANEL
Anion gap: 8 (ref 5–15)
BUN: 19 mg/dL (ref 8–23)
CO2: 29 mmol/L (ref 22–32)
Calcium: 9.4 mg/dL (ref 8.9–10.3)
Chloride: 104 mmol/L (ref 98–111)
Creatinine, Ser: 1.52 mg/dL — ABNORMAL HIGH (ref 0.61–1.24)
GFR calc Af Amer: 55 mL/min — ABNORMAL LOW (ref >60)
GFR calc non Af Amer: 47 mL/min — ABNORMAL LOW (ref >60)
Glucose, Bld: 98 mg/dL (ref 70–99)
Potassium: 4.8 mmol/L (ref 3.5–5.1)
Sodium: 141 mmol/L (ref 135–145)

## 2019-06-11 MED ORDER — NICOTINE 21 MG/24HR TD PT24: 21.0000 mg | MEDICATED_PATCH | Freq: Every day | TRANSDERMAL | 1 refills | Status: DC

## 2019-06-11 MED ORDER — CARVEDILOL 12.5 MG PO TABS: 12.5000 mg | ORAL_TABLET | Freq: Two times a day (BID) | ORAL | 1 refills | Status: DC

## 2019-06-11 NOTE — Progress Notes (Signed)
Office Visit    Patient Name: Jonathon Rios Date of Encounter: 06/11/2019  Primary Care Provider:  [name], FNP Primary Cardiologist:Dr. Fletcher Anon  Chief Complaint    Chief Complaint  Patient presents with  . office visit    2 month F/U-Patient requesting smoking cessation aid; Meds verbally reviewed with patient.    69 year old male with history of chronic systolic heart failure due to hypertensive heart disease, mild chronic kidney disease, hepatitis C now cured, essential hypertension, tobacco use, history of alcohol and drug use now drug-free for 2 years, and seen today for 5-month follow-up.  Past Medical History    Past Medical History:  Diagnosis Date  . Alcoholism and drug addiction in family   . Arthritis   . CHF (congestive heart failure) ([place])   . Coronary artery disease   . Heart disease   . Hypertension   . Irregular heart beat   . Positive TB test    Past Surgical History:  Procedure Laterality Date  . CARDIAC CATHETERIZATION    . TONSILLECTOMY      Allergies  Allergies  Allergen Reactions  . Penicillins Hives    Has patient had a PCN reaction causing immediate rash, facial/tongue/throat swelling, SOB or lightheadedness with hypotension: Yes Has patient had a PCN reaction causing severe rash involving mucus membranes or skin necrosis: No Has patient had a PCN reaction that required hospitalization: No Has patient had a PCN reaction occurring within the last 10 years: No If all of the above answers are "NO", then may proceed with Cephalosporin use.    History of Present Illness    Jonathon Rios is a 69 y.o. 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 He underwent successful treatment for hepatitis C without further concerns. He has attended drug rehab and continues to be drug free for approximately 3 years now. He is a current cigarette smoker with recent increase in tobacco use 2/2  stressors but ongoing desire to cut back as outlined below. He has a history of HFrEF, diagnosed when he lived in [place]. LHC at that time was reportedly without significant obstructive CAD. Echo in [address] showed EF 25-30%. Repeat echo showed EF 25-30% with aortic root mildly dilated at 4cm.   Since his last visit, her reports significant increased stressors at home. BP elevated today with patient reporting this is likely 2/2 home stressors and subsequent BP at end of visit with SBP 130s. Long discussion regarding his past history and current stressors and ways to ensure that BP is well controlled moving forward. He notes recent increase in smoking, which he attributes to his home stressors. He requests assistance with quitting smoking today with recommendations as below. No CP, racing HR, or palpitations. No presyncope, syncope, or mechanical falls. No orthopnea, LEE, abdominal distention, or early satiety. 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 He reports that Ms. Deliah Boston does not taste good to him and has tried that in the past. He makes an effort to eat healthy and loves raw greenbeans, carrots, and white potatoes (even without salt). He reports medicaiton compliance and ongoing drug cessation. BMET was not obtained s/p transition to [facility] with recommendation that, following this lab, we consider  escalation of GDMT for more optimal BP and cardiovascular support and patient understanding.  Home Medications    Prior to Admission medications   Medication Sig Start Date End Date Taking? Authorizing Provider  albuterol (PROAIR HFA) 108 (90 Base) MCG/ACT inhaler Inhale 2 puffs into the lungs every 6 (six) hours as needed for wheezing or shortness of breath. 04/05/18  Yes Guse, Jacquelynn Cree, FNP  aspirin EC 81 MG tablet Take 1 tablet (81 mg total) by mouth  daily. 07/26/18  Yes Wellington Hampshire, MD  budesonide-formoterol (SYMBICORT) 160-4.5 MCG/ACT inhaler TAKE 2 PUFFS BY MOUTH TWICE A DAY 11/08/18  Yes Guse, Jacquelynn Cree, FNP  clotrimazole (LOTRIMIN) 1 % cream Apply 1 application topically 2 (two) times daily. 05/17/19  Yes Arnett, Yvetta Coder, FNP  sacubitril-valsartan (ENTRESTO) 49-51 MG Take 1 tablet by mouth 2 (two) times daily. 04/09/19  Yes Wellington Hampshire, MD  carvedilol (COREG) 12.5 MG tablet Take 1 tablet (12.5 mg total) by mouth 2 (two) times daily. 06/11/19 09/09/19  Marrianne Mood D, PA-C  nicotine (NICODERM CQ - DOSED IN MG/24 HOURS) 21 mg/24hr patch Place 1 patch (21 mg total) onto the skin daily. Any further refills please go through PCP. 06/11/19   Marrianne Mood D, PA-C    Review of Systems    He denies chest pain, palpitations, dyspnea, pnd, orthopnea, n, v, dizziness, syncope, edema, weight gain, or early satiety.   All other systems reviewed and are otherwise negative except as noted above.  Physical Exam    VS:  BP (!) 180/110 (BP Location: Left Arm, Patient Position: Sitting, Cuff Size: Normal)   Pulse 77   Ht 6\' 2"  (1.88 m)   Wt 188 lb (85.3 kg)   SpO2 98%   BMI 24.14 kg/m  , BMI Body mass index is 24.14 kg/m. GEN: Well nourished, well developed, in no acute distress. HEENT: normal. Neck: Supple, no JVD, carotid bruits, or masses. Cardiac: RRR, no murmurs, rubs, or gallops. No clubbing, cyanosis, edema.  Radials/DP/PT 2+ and equal bilaterally.  Respiratory:  Respirations regular and unlabored, clear to auscultation bilaterally. GI: Soft, nontender, nondistended, BS + x 4. MS: no deformity or atrophy. Skin: warm and dry, no rash. Neuro:  Strength and sensation are intact. Psych: Normal affect.  Accessory Clinical Findings    ECG personally reviewed by me today - NSR, 77 bpm, IVCD with QRS 100, LAFB, LVH with repolarization abnormalities - no acute changes.  VITALS Reviewed today   Temp Readings from Last 3  Encounters:  05/15/19 (!) 95.8 F (35.4 C) (Temporal)  11/21/18 98.2 F (36.8 C) (Oral)  11/08/18 (!) 96.7 F (35.9 C) (Temporal)   BP Readings from Last 3 Encounters:  06/11/19 (!) 180/110  05/15/19 (!) 142/96  04/09/19 (!) 160/100   Pulse Readings from Last 3 Encounters:  06/11/19 77  05/15/19 77  04/09/19 74    Wt Readings from Last 3 Encounters:  06/11/19 188 lb (85.3 kg)  05/15/19 188 lb 6.4 oz (85.5 kg)  04/09/19 190 lb 8 oz (86.4 kg)    *Repeat BP 138/100  LABS  reviewed today   CareEverwhere Labs present? Yes/No: Yes  Lab Results  Component Value Date   WBC 6.3 03/22/2018   HGB 16.3 03/22/2018   HCT 47.9 03/22/2018   MCV 90.2 03/22/2018   PLT 146.0 (L) 03/22/2018  WBC 6.7, hemoglobin 16.9, hematocrit 50.2 Lab Results  Component Value Date   CREATININE 1.56 (H) 11/08/2018   BUN 25 (H) 11/08/2018  NA 141 11/08/2018   K 4.1 11/08/2018   CL 103 11/08/2018   CO2 32 11/08/2018  Sodium 142, potassium 3.9, CO2 33.6, BUN 32, creatinine 1.5,  Lab Results  Component Value Date   ALT 17 11/08/2018   AST 15 11/08/2018   ALKPHOS 55 11/08/2018   BILITOT 0.7 11/08/2018  AST 13, ALT 12 Lab Results  Component Value Date   CHOL 188 03/22/2018   HDL 43.90 03/22/2018   LDLDIRECT 98.0 03/22/2018   TRIG 242.0 (H) 03/22/2018   CHOLHDL 4 03/22/2018    Lab Results  Component Value Date   HGBA1C 5.6 03/22/2018   Lab Results  Component Value Date   TSH 0.94 03/22/2018  TSH 1.087  STUDIES/PROCEDURES reviewed today  Echocardiogram 04/04/2019 1. Left ventricular ejection fraction, by visual estimation, is 25 to  30%. The left ventricle has moderate to severely decreased function. There  is severely increased left ventricular hypertrophy.  2. The left ventricle demonstrates global hypokinesis.  3. Left ventricular diastolic parameters are consistent with Grade I  diastolic dysfunction (impaired relaxation).  4. Global right ventricle has normal systolic  function.The right  ventricular size is normal. No increase in right ventricular wall  thickness.  5. Left atrial size was normal.  6. TR signal is inadequate for assessing pulmonary artery systolic  pressure.  7. There is mild dilatation of the aortic root, 4.0 cm.   Assessment & Plan    HTN, poorly controlled --Poorly controlled BP, attributed to smoking a cigarette prior to his appointment (as well as overall increased nicotine use and 2/2 stressors). Initial BP 180/110 with repeat BP 138/100 at end of visit. Long discussion regarding the etiology of his stressors and lifesyle changes moving forward. Continue to work on smoking cessation. Will check a BMET, as this was not obtained after starting Entresto. Further recommendations regarding escalation of GDMT following these results. In the meantime, given its effect on BP and recent studies regarding its influence in particular on elevated DBP, will increase Coreg to 12.5mg , though given his HR at 77bpm today, he was instructed to call in if any dizziness with this change.    Hypertensive CM / NICM HFrEF --Euvolemic on exam. No recent SOB at rest or DOE. No s/sx of worsening heart failure. Most recent echo as above with EF 25-30%, global LV hypokinesis and LVH, G1DD.  Continue to escalate GDMT as renal function allows. Previous addition of spironolactone deferred 2/2 underlying CKD and with plan to first transition from lisinopril-HCTZ to [facility] and reassess renal function following this change. No repeat BMET obtained; therefore, repeat BMET ordered today with further recommendations regarding GDMT at that time if renal function permits. Increase BB to Coreg 12.5mg  BID for further BP control for now. Following optimized medical management, recommend echo to reassess EF as well as annually monitor noted aortic root dilation at 4.0cm. Emphasis on BP and HR control. Avoid FQ and heavy lifting. Smoking cessation advised.   Aortic root  dilation --As directly above. HR and BP control. Increased Coreg to 12.5mg  BID as above. Avoid FQ and heavy lifting. Tobacco cessation recommended. Continue to monitor.   Current tobacco use --Nicotine patches and quit smoking line provided. Expresses desire to cut back, especially as he has recently increased his use and feels this is contributing to his elevated pressure today as well, especially as he smoked a cigarette prior to his appointment today.  History of drug use --Reports ongoing cessation of IV drugs and cocaine with congratulations  and ongoing encouragement provided.    Medication changes: Increase carvedilol to 12.5 mg twice daily.  Start nicotine patch. Labs ordered: BMET Studies / Imaging ordered: None Disposition: RTC 2 weeks   Arvil Chaco, PA-C 06/11/2019

## 2019-06-11 NOTE — Patient Instructions (Addendum)
1-800-QUIT-NOW [phone]) for help with quitting smoking.   Medication Instructions:  Your physician has recommended you make the following change in your medication:  1- INCREASE Carvedilol to 12.5 mg by mouth two times a day. 2- Nicotine patch - Apply one to your skin daily for 6 weeks. You will need to contact your PCP for further instructions and refills after the first 6 weeks.   *If you need a refill on your cardiac medications before your next appointment, please call your pharmacy*  Lab Work: Your physician recommends that you return for lab work in: [place] at [employer] as you leave.  BMET - Please go to the [facility]. You will check in at the front desk to the right as you walk into the atrium. Valet Parking is offered if needed. - No appointment needed. You may go any day between 7 am and 6 pm.  If you have labs (blood work) drawn today and your tests are completely normal, you will receive your results only by: [name] MyChart Message (if you have MyChart) OR . A paper copy in the mail If you have any lab test that is abnormal or we need to change your treatment, we will call you to review the results.  Testing/Procedures: none  Follow-Up: At [facility], you and your health needs are our priority.  As part of our continuing mission to provide you with exceptional heart care, we have created designated Provider Care Teams.  These Care Teams include your primary Cardiologist (physician) and Advanced Practice Providers (APPs -  Physician Assistants and Nurse Practitioners) who all work together to provide you with the care you need, when you need it.  We recommend signing up for the patient portal called "MyChart".  Sign up information is provided on this After Visit Summary.  MyChart is used to connect with patients for Virtual Visits (Telemedicine).  Patients are able to view lab/test results, encounter notes, upcoming appointments, etc.  Non-urgent messages can  be sent to your provider as well.   To learn more about what you can do with MyChart, go to [email or web address].    Your next appointment:   2 week(s)  The format for your next appointment:   In Person  Provider:    You may see  Dr Fletcher Anon or one of the following Advanced Practice Providers on your designated Care Team:    Murray Hodgkins, NP  [name], PA-C  Marrianne Mood, PA-C    Other Instructions Please let us know if you experience any lightheadedness, dizziness or shortness of breath, with this medication change.

## 2019-06-12 ENCOUNTER — Telehealth: Payer: Self-pay | Admitting: Family

## 2019-06-12 NOTE — Telephone Encounter (Signed)
Left message for patient to call back and schedule Medicare Annual Wellness Visit (AWV) either virtually or audio only.  No hx of AWV; please schedule at anytime with Denisa O'Brien-Blaney at [facility] [place]   

## 2019-06-17 ENCOUNTER — Telehealth: Payer: Self-pay

## 2019-06-17 ENCOUNTER — Ambulatory Visit: Payer: 59 | Admitting: Family

## 2019-06-17 NOTE — Telephone Encounter (Signed)
Call to patient to review labs.    Pt verbalized understanding and has no further questions at this time.    Advised pt to call for any further questions or concerns.  No further orders.   pt does not have any current BP readings but agrees to take it daily and call the office with any abnormal readings.

## 2019-06-17 NOTE — Telephone Encounter (Signed)
-----   Message from Arvil Chaco, PA-C sent at 06/17/2019  1:24 PM EDT ----- Renal function stable since start of Entresto. His potassium is on the high side, so we will hold off on adding spironolactone.   How has his blood pressure been since we increased his Coreg?

## 2019-06-19 NOTE — Progress Notes (Deleted)
Cardiology Office Note    Date:  06/19/2019   ID:  [name], [name] 1950/10/28, MRN [id number]  PCP:  [name], FNP  Cardiologist:  [name], MD  Electrophysiologist:  None   Chief Complaint: Follow-up  History of Present Illness:   Jonathon Rios is a 69 y.o. male with history of HFrEF secondary to nonischemic cardiomyopathy with hypertensive heart disease, CKD stage III, hepatitis C status post treatment, polysubstance abuse with history of heroin use in the 49s along with cocaine use, excessive alcohol use and ongoing tobacco use who has attended drug rehab and been drug-free for 2 years, and prior incarceration who presents for follow-up of ***.  He was diagnosed with systolic CHF years ago when he lived in [place].  Cardiac cath at that time reportedly showed no significant blockages.  Echo in 12/2016 showed an EF of 25 to 30%.  He was seen in the office in 04/2019 and was doing well from a cardiac perspective without chest pain, dyspnea, or significant lower extremity swelling.  BP 160/100.  Echo at that time showed an EF of 25 to [id number], diastolic dysfunction, normal RV systolic function and ventricular cavity size, mild mitral regurgitation, and a mildly dilated aortic root measuring 4 cm.  He was transitioned from lisinopril/HCTZ to [place] with recommendation to further optimize GDMT moving forward.  He was last seen in the office on 06/11/2019 with a BP of 138/100 and a weight down 2 pounds when compared to his prior visit.  Carvedilol was titrated to 12.5 mg twice daily.  Follow-up labs showed stable renal function and potassium as outlined below.  ***   Labs independently reviewed: 05/2019 - potassium 4.8, BUN 19, creatinine 1.52 10/2018 - albumin 4.0, AST/ALT normal 02/2018 - direct LDL 98, TC 188, TG 242, HDL 43, TSH normal, A1c 5.6, Hgb 16.3, PLT 146  Past Medical History:  Diagnosis Date  . Alcoholism and drug addiction in family   . Arthritis   . CHF  (congestive heart failure) ([place])   . Coronary artery disease   . Heart disease   . Hypertension   . Irregular heart beat   . Positive TB test     Past Surgical History:  Procedure Laterality Date  . CARDIAC CATHETERIZATION    . TONSILLECTOMY      Current Medications: No outpatient medications have been marked as taking for the 06/25/19 encounter (Appointment) with [name], PA-C.    Allergies:   Penicillins   Social History   Socioeconomic History  . Marital status: Legally Separated    Spouse name: Not on file  . Number of children: 4  . Years of education: 69  . Highest education level: High school graduate  Occupational History  . Not on file  Tobacco Use  . Smoking status: Current Some Day Smoker    Packs/day: 1.00    Years: 50.00    Pack years: 50.00    Types: Cigarettes    Last attempt to quit: 02/23/2017    Years since quitting: 2.3  . Smokeless tobacco: Never Used  Substance and Sexual Activity  . Alcohol use: No  . Drug use: Not Currently    Types: "Crack" cocaine, Heroin, Marijuana, LSD    Comment: Sober since 2018.   [name] Sexual activity: Yes    Birth control/protection: Condom  Other Topics Concern  . 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    Social Determinants of Health   Financial Resource Strain:   . Difficulty of Paying Living Expenses:   Food Insecurity:   . Worried About Charity fundraiser in the Last Year:   . Arboriculturist in the Last Year:   Transportation Needs:   . Film/video editor (Medical):   [name] Lack of Transportation (Non-Medical):   Physical Activity:   . Days of Exercise per Week:   . Minutes of Exercise per Session:   Stress:   . Feeling of  Stress :   Social Connections:   . Frequency of Communication with Friends and Family:   . Frequency of Social Gatherings with Friends and Family:   . Attends Religious Services:   . Active Member of Clubs or Organizations:   . Attends Archivist Meetings:   [name] Marital Status:      Family History:  The patient's family history includes Alcohol abuse in his daughter, father, mother, and sister; Asthma in his sister; Depression in his daughter and sister; Diabetes in his mother; Drug abuse in his daughter and sister; Early death in his daughter; Hyperlipidemia in his mother; Hypertension in his daughter, father, mother, and sister.  ROS:   ROS   EKGs/Labs/Other Studies Reviewed:    Studies reviewed were summarized above. The additional studies were reviewed today:  2D echo 04/04/2019: 1. Left ventricular ejection fraction, by visual estimation, is 25 to  30%. The left ventricle has moderate to severely decreased function. There  is severely increased left ventricular hypertrophy.  2. The left ventricle demonstrates global hypokinesis.  3. Left ventricular diastolic parameters are consistent with Grade I  diastolic dysfunction (impaired relaxation).  4. Global right ventricle has normal systolic function.The right  ventricular size is normal. No increase in right ventricular wall  thickness.  5. Left atrial size was normal.  6. TR signal is inadequate for assessing pulmonary artery systolic  pressure.  7. There is mild dilatation of the aortic root, 4.0 cm.   EKG:  EKG is ordered today.  The EKG ordered today demonstrates ***  Recent Labs: 11/08/2018: ALT 17 06/11/2019: BUN 19; Creatinine, Ser 1.52; Potassium 4.8; Sodium 141  Recent Lipid Panel    Component Value Date/Time   CHOL 188 03/22/2018 1433   TRIG 242.0 (H) 03/22/2018 1433   HDL 43.90 03/22/2018 1433   CHOLHDL 4 03/22/2018 1433   VLDL 48.4 (H) 03/22/2018 1433   LDLDIRECT 98.0 03/22/2018 1433     PHYSICAL EXAM:    VS:  There were no vitals taken for this visit.  BMI: There is no height or weight on file to calculate BMI.  Physical Exam  Wt Readings from Last 3 Encounters:  06/11/19 188 lb (85.3 kg)  05/15/19 188 lb 6.4 oz (85.5 kg)  04/09/19 190 lb 8 oz (86.4 kg)     ASSESSMENT & PLAN:   1. ***  Disposition: F/u with Dr. Fletcher Anon or an APP in ***.   Medication Adjustments/Labs and Tests Ordered: Current medicines are reviewed at length with the patient today.  Concerns regarding medicines are outlined above. Medication changes, Labs and Tests ordered today are summarized above and listed in the Patient Instructions accessible in  Encounters.   Signed, [name], PA-C 06/19/2019 10:34 AM     [place] [address] [place] [address] [place], [place] [address] [phone]

## 2019-06-25 ENCOUNTER — Ambulatory Visit: Payer: 59 | Admitting: Physician Assistant

## 2019-06-26 ENCOUNTER — Encounter: Payer: Self-pay | Admitting: Physician Assistant

## 2019-07-01 ENCOUNTER — Other Ambulatory Visit: Payer: Self-pay | Admitting: Family

## 2019-07-02 ENCOUNTER — Telehealth: Payer: Self-pay | Admitting: Family

## 2019-07-02 ENCOUNTER — Ambulatory Visit (INDEPENDENT_AMBULATORY_CARE_PROVIDER_SITE_OTHER): Payer: 59 | Admitting: Family

## 2019-07-02 ENCOUNTER — Other Ambulatory Visit: Payer: Self-pay | Admitting: Cardiovascular Disease

## 2019-07-02 ENCOUNTER — Encounter: Payer: Self-pay | Admitting: Family

## 2019-07-02 ENCOUNTER — Other Ambulatory Visit: Payer: Self-pay

## 2019-07-02 DIAGNOSIS — I10 Essential (primary) hypertension: Secondary | ICD-10-CM

## 2019-07-02 DIAGNOSIS — B354 Tinea corporis: Secondary | ICD-10-CM

## 2019-07-02 DIAGNOSIS — I5022 Chronic systolic (congestive) heart failure: Secondary | ICD-10-CM

## 2019-07-02 DIAGNOSIS — B182 Chronic viral hepatitis C: Secondary | ICD-10-CM

## 2019-07-02 NOTE — Assessment & Plan Note (Signed)
Some improvement.  Will give patient a little topical steroid to mix with clotrimazole . If no   resolution of rash, patient will let me know and we will consult dermatology

## 2019-07-02 NOTE — Patient Instructions (Signed)
Trial of kenalog cream with antifungal cream, clotrimazole. If no resolution of rash, please let me know as I like to refer you to dermatology   it is imperative that you are seen AT least twice per year for labs and monitoring. Monitor blood pressure at home and me 5-6 reading on separate days. Goal is less than 120/80, based on newest guidelines, however we certainly want to be less than 130/80;  if persistently higher, please make sooner follow up appointment so we can recheck you blood pressure and manage/ adjust medications.   Managing Your Hypertension Hypertension is commonly called high blood pressure. This is when the force of your blood pressing against the walls of your arteries is too strong. Arteries are blood vessels that carry blood from your heart throughout your body. Hypertension forces the heart to work harder to pump blood, and may cause the arteries to become narrow or stiff. Having untreated or uncontrolled hypertension can cause heart attack, stroke, kidney disease, and other problems. What are blood pressure readings? A blood pressure reading consists of a higher number over a lower number. Ideally, your blood pressure should be below 120/80. The first ("top") number is called the systolic pressure. It is a measure of the pressure in your arteries as your heart beats. The second ("bottom") number is called the diastolic pressure. It is a measure of the pressure in your arteries as the heart relaxes. What does my blood pressure reading mean? Blood pressure is classified into four stages. Based on your blood pressure reading, your health care provider may use the following stages to determine what type of treatment you need, if any. Systolic pressure and diastolic pressure are measured in a unit called mm Hg. Normal  Systolic pressure: below 123456.  Diastolic pressure: below 80. Elevated  Systolic pressure: [id number].  Diastolic pressure: below 80. Hypertension stage 1   Systolic pressure: 0000000.  Diastolic pressure: [id number]. Hypertension stage 2  Systolic pressure: [id number] or above.  Diastolic pressure: 90 or above. What health risks are associated with hypertension? Managing your hypertension is an important responsibility. Uncontrolled hypertension can lead to:  A heart attack.  A stroke.  A weakened blood vessel (aneurysm).  Heart failure.  Kidney damage.  Eye damage.  Metabolic syndrome.  Memory and concentration problems. What changes can I make to manage my hypertension? Hypertension can be managed by making lifestyle changes and possibly by taking medicines. Your health care provider will help you make a plan to bring your blood pressure within a normal range. Eating and drinking   Eat a diet that is high in fiber and potassium, and low in salt (sodium), added sugar, and fat. An example eating plan is called the DASH (Dietary Approaches to Stop Hypertension) diet. To eat this way: ? Eat plenty of fresh fruits and vegetables. Try to fill half of your plate at each meal with fruits and vegetables. ? Eat whole grains, such as whole wheat pasta, brown rice, or whole grain bread. Fill about one quarter of your plate with whole grains. ? Eat low-fat diary products. ? Avoid fatty cuts of meat, processed or cured meats, and poultry with skin. Fill about one quarter of your plate with lean proteins such as fish, chicken without skin, beans, eggs, and tofu. ? Avoid premade and processed foods. These tend to be higher in sodium, added sugar, and fat.  Reduce your daily sodium intake. Most people with hypertension should eat less than 1,500 mg of sodium a day.  Limit alcohol intake to no more than 1 drink a day for nonpregnant women and 2 drinks a day for men. One drink equals 12 oz of beer, 5 oz of wine, or 1 oz of hard liquor. Lifestyle  Work with your health care provider to maintain a healthy body weight, or to lose weight. Ask what an ideal  weight is for you.  Get at least 30 minutes of exercise that causes your heart to beat faster (aerobic exercise) most days of the week. Activities may include walking, swimming, or biking.  Include exercise to strengthen your muscles (resistance exercise), such as weight lifting, as part of your weekly exercise routine. Try to do these types of exercises for 30 minutes at least 3 days a week.  Do not use any products that contain nicotine or tobacco, such as cigarettes and e-cigarettes. If you need help quitting, ask your health care provider.  Control any long-term (chronic) conditions you have, such as high cholesterol or diabetes. Monitoring  Monitor your blood pressure at home as told by your health care provider. Your personal target blood pressure may vary depending on your medical conditions, your age, and other factors.  Have your blood pressure checked regularly, as often as told by your health care provider. Working with your health care provider  Review all the medicines you take with your health care provider because there may be side effects or interactions.  Talk with your health care provider about your diet, exercise habits, and other lifestyle factors that may be contributing to hypertension.  Visit your health care provider regularly. Your health care provider can help you create and adjust your plan for managing hypertension. Will I need medicine to control my blood pressure? Your health care provider may prescribe medicine if lifestyle changes are not enough to get your blood pressure under control, and if:  Your systolic blood pressure is 130 or higher.  Your diastolic blood pressure is 80 or higher. Take medicines only as told by your health care provider. Follow the directions carefully. Blood pressure medicines must be taken as prescribed. The medicine does not work as well when you skip doses. Skipping doses also puts you at risk for problems. Contact a health  care provider if:  You think you are having a reaction to medicines you have taken.  You have repeated (recurrent) headaches.  You feel dizzy.  You have swelling in your ankles.  You have trouble with your vision. Get help right away if:  You develop a severe headache or confusion.  You have unusual weakness or numbness, or you feel faint.  You have severe pain in your chest or abdomen.  You vomit repeatedly.  You have trouble breathing. Summary  Hypertension is when the force of blood pumping through your arteries is too strong. If this condition is not controlled, it may put you at risk for serious complications.  Your personal target blood pressure may vary depending on your medical conditions, your age, and other factors. For most people, a normal blood pressure is less than 120/80.  Hypertension is managed by lifestyle changes, medicines, or both. Lifestyle changes include weight loss, eating a healthy, low-sodium diet, exercising more, and limiting alcohol. This information is not intended to replace advice given to you by your health care provider. Make sure you discuss any questions you have with your health care provider. Document Revised: 06/08/2018 Document Reviewed: 01/13/2016 Elsevier Patient Education  [place].

## 2019-07-02 NOTE — Telephone Encounter (Signed)
Please call [facility] gastroenterology Jonathon Rios Please ask her if it is appropriate for Jonathon Rios to continue to follow with her for history of hepatitis C.  The last note that I can see-- HCV undetected 07/2018   any surveillance I need to be doing on my end?  Or should I advise patient to follow back up with her  Darcel Bayley, [facility]  [address]  [place], [place] [address]  [phone]  [phone] (Fax)

## 2019-07-02 NOTE — Progress Notes (Signed)
Subjective:    Patient ID: Jonathon Rios, male    DOB: 1950-04-20, 69 y.o.   MRN: [id number]  CC: Jonathon Rios is a 69 y.o. male who presents today for follow up.   HPI: Feels well today.   Rash on arms has improved with clotrimazole cream however on the right arm it is not completely resolved.  No history of eczema.  No increased warmth or redness  HTN- compliant with medication Smoked a ciggarette on the way over here.  Denies exertional chest pain or pressure, numbness or tingling radiating to left arm or jaw, palpitations, dizziness, frequent headaches, changes in vision, or shortness of breath.      Seen by cardiology 06/11/2019 for hypertension.Increased Coreg 12.5mg   H/o hep c- HCV undetected 07/2018 HISTORY:  Past Medical History:  Diagnosis Date  . Alcoholism and drug addiction in family   . Arthritis   . CHF (congestive heart failure) ([place])   . Coronary artery disease   . Heart disease   . Hypertension   . Irregular heart beat   . Positive TB test    Past Surgical History:  Procedure Laterality Date  . CARDIAC CATHETERIZATION    . TONSILLECTOMY     Family History  Problem Relation Age of Onset  . Hypertension Mother   . Diabetes Mother   . Alcohol abuse Mother   . Hyperlipidemia Mother   . Hypertension Sister   . Alcohol abuse Sister   . Asthma Sister   . Depression Sister   . Drug abuse Sister   . Alcohol abuse Father   . Hypertension Father   . Hypertension Daughter   . Early death Daughter   . Drug abuse Daughter   . Depression Daughter   . Alcohol abuse Daughter     Allergies: Penicillins Current Outpatient Medications on File Prior to Visit  Medication Sig Dispense Refill  . albuterol (PROAIR HFA) 108 (90 Base) MCG/ACT inhaler Inhale 2 puffs into the lungs every 6 (six) hours as needed for wheezing or shortness of breath. 1 Inhaler 5  . aspirin EC 81 MG tablet Take 1 tablet (81 mg total) by mouth daily.    . budesonide-formoterol  (SYMBICORT) 160-4.5 MCG/ACT inhaler TAKE 2 PUFFS BY MOUTH TWICE A DAY 30.6 Inhaler 1  . carvedilol (COREG) 12.5 MG tablet Take 1 tablet (12.5 mg total) by mouth 2 (two) times daily. 180 tablet 1  . clotrimazole (LOTRIMIN) 1 % cream Apply 1 application topically 2 (two) times daily. 30 g 2  . sacubitril-valsartan (ENTRESTO) 49-51 MG Take 1 tablet by mouth 2 (two) times daily. 60 tablet 5  . nicotine (NICODERM CQ - DOSED IN MG/24 HOURS) 21 mg/24hr patch Place 1 patch (21 mg total) onto the skin daily. Any further refills please go through PCP. (Patient not taking: Reported on 07/02/2019) 21 patch 1   No current facility-administered medications on file prior to visit.    Social History   Tobacco Use  . Smoking status: Current Some Day Smoker    Packs/day: 1.00    Years: 50.00    Pack years: 50.00    Types: Cigarettes    Last attempt to quit: 02/23/2017    Years since quitting: 2.3  . Smokeless tobacco: Never Used  Substance Use Topics  . Alcohol use: No  . Drug use: Not Currently    Types: "Crack" cocaine, Heroin, Marijuana, LSD    Comment: Sober since 2018.     Review of Systems  Constitutional: Negative for chills and fever.  Respiratory: Negative for cough and shortness of breath.   Cardiovascular: Negative for chest pain, palpitations and leg swelling.  Gastrointestinal: Negative for nausea and vomiting.  Skin: Positive for rash (improved).      Objective:    BP 138/84   Pulse 78   Temp (!) 96.3 F (35.7 C) (Temporal)   Ht 6\' 2"  (1.88 m)   Wt 187 lb 3.2 oz (84.9 kg)   SpO2 (!) 78%   BMI 24.04 kg/m  BP Readings from Last 3 Encounters:  07/02/19 138/84  06/11/19 (!) 138/100  05/15/19 (!) 142/96   Wt Readings from Last 3 Encounters:  07/02/19 187 lb 3.2 oz (84.9 kg)  06/11/19 188 lb (85.3 kg)  05/15/19 188 lb 6.4 oz (85.5 kg)    Physical Exam Vitals reviewed.  Constitutional:      Appearance: He is well-developed.  Cardiovascular:     Rate and Rhythm:  Regular rhythm.     Heart sounds: Normal heart sounds.  Pulmonary:     Effort: Pulmonary effort is normal. No respiratory distress.     Breath sounds: Normal breath sounds. No wheezing, rhonchi or rales.  Skin:    General: Skin is warm and dry.  Neurological:     Mental Status: He is alert.  Psychiatric:        Speech: Speech normal.        Behavior: Behavior normal.        Assessment & Plan:   Problem List Items Addressed This Visit      Cardiovascular and Mediastinum   HTN (hypertension) (Chronic)    Stable, improved on increased dose of Coreg.  We will continue         Musculoskeletal and Integument   Tinea corporis    Some improvement.  Will give patient a little topical steroid to mix with clotrimazole . If no   resolution of rash, patient will let me know and we will consult dermatology          I have discontinued Priscilla A. Taft's lisinopril-hydrochlorothiazide. I am also having him maintain his albuterol, aspirin EC, budesonide-formoterol, Entresto, clotrimazole, nicotine, and carvedilol.   No orders of the defined types were placed in this encounter.   Return precautions given.   Risks, benefits, and alternatives of the medications and treatment plan prescribed today were discussed, and patient expressed understanding.   Education regarding symptom management and diagnosis given to patient on AVS.  Continue to follow with [name], FNP for routine health maintenance.   [name] and I agreed with plan.   Mable Paris, FNP

## 2019-07-02 NOTE — Telephone Encounter (Signed)
I spoke with Butch Penny from [facility]. She sent a message back for [place] or CMA to review. I left my name & number for someone to call me back.

## 2019-07-02 NOTE — Assessment & Plan Note (Signed)
Stable, improved on increased dose of Coreg.  We will continue

## 2019-07-03 NOTE — Telephone Encounter (Signed)
Musician that works with [employer] & deals with their Hep C patients called back. She informed me that patient signed a contract to come back for labs to recheck at week 12, 24 & 48 for HCV RNA. He no showed all of these, so he is due for f/u labs to make sure he is still non-reactive. She is sending you an outside message that should come to you informing you of all this! Pt needs to have the f/u labs drawn to see if he needs to be referred elsewhere like [facility] or [facility] since he would have failed treatment.

## 2019-07-05 NOTE — Telephone Encounter (Signed)
Call pt We spoke with gastroenterology, [facility].  He actually needs to continue surveillance for history of hepatitis C as appeared he failed Hep C treatment. He was due for labs at 12 weeks, 24 and 48 weeks.  Please give him phone number to call and schedule and reiterate the importance of this.  Tammi Klippel [place], [place]  [address]  [place], [place] [address]  [phone]  [phone] (Fax)

## 2019-07-05 NOTE — Telephone Encounter (Signed)
I called patient & stressed the importance of him calling them to get these scheduled. Patient took down their number, but was agitated & stated that he did go back for labs & did not know why they had no record.

## 2019-07-09 ENCOUNTER — Telehealth: Payer: Self-pay | Admitting: Family

## 2019-07-09 NOTE — Telephone Encounter (Signed)
I called patient & he stated that nicoderm patches used to be free when he was getting them from [employer] in [place], I asked if insurance had changed & he stated that it hadn't. I called [facility] to inquire about an alternative, but there was not one shown when ran through insurance. I was informed by pharmacist that this was bc it was an OTC item. I let patient know this & he stated that he had an OTC card to use. I suggested that he try to see if he was bale to use this for patches. He also said that he may try to call medication management as we suggested & provided him with brochure.  He also said that [employer] office never called back to get f/u or labs scheduled. I asked that he continue to call & follow-up with, as this was VERY important.

## 2019-07-09 NOTE — Telephone Encounter (Signed)
Pt would like a call back regarding Nicoderm patches he is now having to pay out of pocket for. He said Joycelyn Schmid told him to let her know what she needs to do in order for him to quit smoking.

## 2019-07-09 NOTE — Telephone Encounter (Signed)
Call pt  I got a nice message from [facility].  If he would like we can check Hep C viral load, if undetected he would need labs every 6 months to follow liver enzymes If detected, [facility] advised a referral to [facility] or [facility] for re-treatment since he would have failed [place]   I ordered lab, please schedule.     Note below:  Regarding Mr. Everhart's Hepatitis C, he did not follow up for his 20, 24 or 48 week labs per our protocol for treatment. He would be due to recheck a HCV Viral Load to ensure it remains not detected. If not detected, Sharyn Lull recommends routine 6 month monitoring of his liver enzymes. Sharyn Lull noted that surveillance imaging with abdominal US is not needed since he had minimal fibrosis (F0/F1) on his liver elastography. If Hepatitis C is still detected, she recommends a medical center consultation with [facility] or [facility] for re-treatment since he would have failed [place] with our GI department locally. Please let me know if there are any further questions or concerns. We are always happy to assist in coordinating care for patients.

## 2019-07-10 NOTE — Telephone Encounter (Signed)
LMTCB

## 2019-07-11 ENCOUNTER — Other Ambulatory Visit (INDEPENDENT_AMBULATORY_CARE_PROVIDER_SITE_OTHER): Payer: 59

## 2019-07-11 ENCOUNTER — Other Ambulatory Visit: Payer: Self-pay

## 2019-07-11 DIAGNOSIS — B182 Chronic viral hepatitis C: Secondary | ICD-10-CM

## 2019-07-11 NOTE — Telephone Encounter (Signed)
Patient called and scheduled for lab draw this afternoon.

## 2019-07-11 NOTE — Addendum Note (Signed)
Addended by: Leeanne Rio on: 07/11/2019 02:11 PM   Modules accepted: Orders

## 2019-07-14 LAB — HEPATITIS C RNA QUANTITATIVE
HCV Quantitative Log: <1.18 Log IU/mL
HCV RNA, PCR, QN: <15 IU/mL

## 2019-07-15 ENCOUNTER — Telehealth: Payer: Self-pay | Admitting: Family

## 2019-07-15 ENCOUNTER — Other Ambulatory Visit: Payer: Self-pay | Admitting: Family

## 2019-07-15 DIAGNOSIS — B182 Chronic viral hepatitis C: Secondary | ICD-10-CM

## 2019-07-15 NOTE — Telephone Encounter (Signed)
noted 

## 2019-07-15 NOTE — Telephone Encounter (Signed)
Call [place], [place] at [phone]    Please thank her and Luetta Nutting, RN for the detailed message regarding patient  I drew hep C RNA quantitative, PCA and log are not detected . Per her message, I will follow liver enzymes every 6 months. Since he had minimal fibrosis, I will NOT order ultrasound of liver.   At this point, beside liver enzymes, does he need further surveillance OR GI follow up for h/o Hep C?  I want to ensure I advise him correctly here.

## 2019-07-15 NOTE — Telephone Encounter (Signed)
I called & spoke with Museum/gallery conservator, RN at [employer], PA's office. I let her know that the Hep C RNA quantitative was negative. She said that all that was needed was for lab to be checked as well as liver function/liver enzymes every  months. She said if we needed anything else in regards to patient to just reach out to her.

## 2019-07-16 ENCOUNTER — Telehealth: Payer: Self-pay

## 2019-07-16 NOTE — Telephone Encounter (Signed)
LMTCB for lab results.  

## 2019-07-17 ENCOUNTER — Telehealth: Payer: Self-pay | Admitting: Family

## 2019-07-17 NOTE — Telephone Encounter (Signed)
Returned patient's call for labs, but again patient did not answer.

## 2019-07-17 NOTE — Telephone Encounter (Signed)
Pt called said he was returning cal

## 2019-07-17 NOTE — Telephone Encounter (Signed)
Patient called and notified of results. Patient scheduled for hepatic function panel tomorrow.

## 2019-07-18 ENCOUNTER — Other Ambulatory Visit (INDEPENDENT_AMBULATORY_CARE_PROVIDER_SITE_OTHER): Payer: 59

## 2019-07-18 ENCOUNTER — Other Ambulatory Visit: Payer: Self-pay

## 2019-07-18 DIAGNOSIS — B182 Chronic viral hepatitis C: Secondary | ICD-10-CM | POA: Diagnosis not present

## 2019-07-18 LAB — HEPATIC FUNCTION PANEL
ALT: 8 U/L (ref 0–53)
AST: 13 U/L (ref 0–37)
Albumin: 4.1 g/dL (ref 3.5–5.2)
Alkaline Phosphatase: 57 U/L (ref 39–117)
Bilirubin, Direct: 0.1 mg/dL (ref 0.0–0.3)
Total Bilirubin: 0.6 mg/dL (ref 0.2–1.2)
Total Protein: 7.8 g/dL (ref 6.0–8.3)

## 2019-08-05 ENCOUNTER — Other Ambulatory Visit: Payer: Self-pay | Admitting: Family

## 2019-08-05 ENCOUNTER — Other Ambulatory Visit: Payer: Self-pay | Admitting: Cardiovascular Disease

## 2019-08-05 DIAGNOSIS — I5022 Chronic systolic (congestive) heart failure: Secondary | ICD-10-CM

## 2019-08-05 DIAGNOSIS — B354 Tinea corporis: Secondary | ICD-10-CM

## 2019-08-05 DIAGNOSIS — I10 Essential (primary) hypertension: Secondary | ICD-10-CM

## 2019-10-08 ENCOUNTER — Other Ambulatory Visit: Payer: Self-pay | Admitting: Cardiovascular Disease

## 2019-10-08 DIAGNOSIS — I5022 Chronic systolic (congestive) heart failure: Secondary | ICD-10-CM

## 2019-10-08 NOTE — Telephone Encounter (Signed)
LVM for patient to call back and schedule f/u

## 2019-10-08 NOTE — Telephone Encounter (Signed)
Please schedule overdue appointment. Patient did not show for last scheduled visit. Thank you!

## 2019-10-16 NOTE — Telephone Encounter (Signed)
Scheduled

## 2019-10-31 NOTE — Progress Notes (Signed)
Cardiology Office Note    Date:  11/07/2019   ID:  Jonathon Rios, DOB 05-Jun-1950, MRN [id number]  PCP:  [name], FNP  Cardiologist:  [name], MD  Electrophysiologist:  None   Chief Complaint: Follow up  History of Present Illness:   Jonathon Rios is a 69 y.o. male with history of HFrEF secondary to NICM, hypertensive heart disease, CKD stage II, hepatitis C s/p therapy, prior polysubstance use including IVDU in the setting of significant trauma, excessive alcohol use, prior incarceration, mildly dilated aortic root, HTN, and tobacco use who presents for follow up of his cardiomyopathy and hypertensive heart disease.  He has a history of heroin use in the 1970s, and more recently cocaine use. He has attended drug rehab, and has been drug free for over 4 years now. He was diagnosed with HFrEF years ago while living in [place]. He had a cardiac cath done at that time and was told there was no significant CAD. Echo in 12/2016 showed an EF of 25-30%. Repeat echo in 04/2019 showed an EF of 25-30%, global hypokinesis, Gr1DD, normal RVSF and RV cavity size, and a mildly dilated aortic root measuring 4 cm. When he was seen in 04/2019, his BP was poorly controlled at 160/100 and his lisinopril was changed to [facility]. He was last seen by Marrianne Mood, PA-C in 05/2019 with a BP of 138/100, initially 180/110 and a stable weight. He indicated his elevated BP was in the setting of home stressors. He continued to smoke. Coreg was increased to 12.5 mg bid. Follow up labs showed stable renal function as outlined below. Follow up was recommended in 2 weeks.   He comes in doing very well.  He denies any chest pain, palpitations, dyspnea, dizziness, presyncope, syncope, lower extremity swelling, abdominal distention, orthopnea, PND, or early satiety.  He is tolerating all medications without issues though has run out of his Symbicort and albuterol inhalers approximately 3 days ago and requests  refill on these.  His weight remains stable.  Indicates his blood pressure is typically well controlled at home though is unable to supply specific readings outside of his diastolic is typically less than 90.  He continues to have significant stressors at home.  He remains drug-free for greater than 4 years now.  He goes into great detail today describing the circumstances in his childhood surrounding these events.  He does continue to smoke and is considering cessation moving forward.  He does watch his salt and p.o. fluid intake.   Labs independently reviewed: 06/2019 - albumin 4.1, AST/ALT normal 05/2019 - potassium 4.8, BUN 19, SCr 1.52 02/2018 - direct LDL 98, TC 188, TG 242, HDL 43, TSH normal, HGB 16.3, PLT 146, A1c 5.6   Past Medical History:  Diagnosis Date  . Alcoholism and drug addiction in family   . Arthritis   . CHF (congestive heart failure) ([place])   . Coronary artery disease   . Heart disease   . Hypertension   . Irregular heart beat   . Positive TB test     Past Surgical History:  Procedure Laterality Date  . CARDIAC CATHETERIZATION    . TONSILLECTOMY      Current Medications: Current Meds  Medication Sig  . albuterol (PROAIR HFA) 108 (90 Base) MCG/ACT inhaler Inhale 2 puffs into the lungs every 6 (six) hours as needed for wheezing or shortness of breath.  [name] aspirin EC 81 MG tablet Take 1 tablet (81 mg total) by  mouth daily.  . budesonide-formoterol (SYMBICORT) 160-4.5 MCG/ACT inhaler TAKE 2 PUFFS BY MOUTH TWICE A DAY  . carvedilol (COREG) 12.5 MG tablet Take 1 tablet (12.5 mg total) by mouth 2 (two) times daily.  . clotrimazole (LOTRIMIN) 1 % cream APPLY TO AFFECTED AREA TWICE A DAY  . [DISCONTINUED] albuterol (PROAIR HFA) 108 (90 Base) MCG/ACT inhaler Inhale 2 puffs into the lungs every 6 (six) hours as needed for wheezing or shortness of breath.  . [DISCONTINUED] budesonide-formoterol (SYMBICORT) 160-4.5 MCG/ACT inhaler TAKE 2 PUFFS BY MOUTH TWICE A DAY  .  [DISCONTINUED] ENTRESTO 49-51 MG TAKE 1 TABLET BY MOUTH TWICE A DAY - STOP LISINOPRIL    Allergies:   Penicillins   Social History   Socioeconomic History  . Marital status: Legally Separated    Spouse name: Not on file  . Number of children: 4  . Years of education: 59  . Highest education level: High school graduate  Occupational History  . Not on file  Tobacco Use  . Smoking status: Current Some Day Smoker    Packs/day: 1.00    Years: 50.00    Pack years: 50.00    Types: Cigarettes    Last attempt to quit: 02/23/2017    Years since quitting: 2.7  . Smokeless tobacco: Never Used  Vaping Use  . Vaping Use: Never used  Substance and Sexual Activity  . Alcohol use: No  . Drug use: Not Currently    Types: "Crack" cocaine, Heroin, Marijuana, LSD    Comment: Sober since 2018.   [name] Sexual activity: Yes    Birth control/protection: Condom  Other Topics Concern  . 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    Social Determinants of Health   Financial Resource Strain:   . Difficulty of Paying Living Expenses: Not on file  Food Insecurity:   . Worried About Charity fundraiser in the Last Year: Not on file  . Ran Out of Food in the Last Year: Not on file  Transportation Needs:   . Lack of Transportation (Medical): Not on file  . Lack of Transportation (Non-Medical): Not on file  Physical Activity:   . Days of Exercise per Week: Not on file  . Minutes of Exercise per Session: Not on file  Stress:   . Feeling of Stress : Not on file  Social Connections:   . Frequency of Communication with Friends and Family: Not on file  . Frequency of Social Gatherings with Friends and Family: Not on file  . Attends  Religious Services: Not on file  . Active Member of Clubs or Organizations: Not on file  . Attends Archivist Meetings: Not on file  . Marital Status: Not on file     Family History:  The patient's family history includes Alcohol abuse in his daughter, father, mother, and sister; Asthma in his sister; Depression in his daughter and sister; Diabetes in his mother; Drug abuse in his daughter and sister; Early death in his daughter; Hyperlipidemia in his mother; Hypertension in his daughter, father, mother, and sister.  ROS:   Review of Systems  Constitutional: Negative for chills, diaphoresis, fever, malaise/fatigue  and weight loss.  HENT: Negative for congestion.   Eyes: Negative for discharge and redness.  Respiratory: Negative for cough, sputum production, shortness of breath and wheezing.   Cardiovascular: Negative for chest pain, palpitations, orthopnea, claudication, leg swelling and PND.  Gastrointestinal: Negative for abdominal pain, blood in stool, heartburn, melena, nausea and vomiting.  Musculoskeletal: Negative for falls and myalgias.  Skin: Negative for rash.  Neurological: Negative for dizziness, tingling, tremors, sensory change, speech change, focal weakness, loss of consciousness and weakness.  Endo/Heme/Allergies: Does not bruise/bleed easily.  Psychiatric/Behavioral: Negative for substance abuse. The patient is not nervous/anxious.   All other systems reviewed and are negative.    EKGs/Labs/Other Studies Reviewed:    Studies reviewed were summarized above. The additional studies were reviewed today:  2D echo 12/2016: - Left ventricle: The cavity size was moderately dilated. Wall  thickness was increased in a pattern of moderate LVH. There was  moderate concentric hypertrophy. Systolic function was severely  reduced. The estimated ejection fraction was in the range of 25%  to 30%. Regional wall motion abnormalities cannot be excluded.  Doppler  parameters are consistent with abnormal left ventricular  relaxation (grade 1 diastolic dysfunction).  - Left atrium: The atrium was mildly dilated.  - Right ventricle: The cavity size was moderately dilated. Wall  thickness was normal.  - Right atrium: The atrium was mildly dilated.   Impressions:   - Moderate to severely depressed LVF  Globally depressed  Moderate to severe LVH concentrically  EF=25-30%  Mod dilation of RV  Mild/Mod depressed RVF. There was no evidence of a vegetation. __________  2D echo 04/2019: 1. Left ventricular ejection fraction, by visual estimation, is 25 to  30%. The left ventricle has moderate to severely decreased function. There  is severely increased left ventricular hypertrophy.  2. The left ventricle demonstrates global hypokinesis.  3. Left ventricular diastolic parameters are consistent with Grade I  diastolic dysfunction (impaired relaxation).  4. Global right ventricle has normal systolic function.The right  ventricular size is normal. No increase in right ventricular wall  thickness.  5. Left atrial size was normal.  6. TR signal is inadequate for assessing pulmonary artery systolic  pressure.  7. There is mild dilatation of the aortic root, 4.0 cm.   EKG:  EKG is ordered today.  The EKG ordered today demonstrates NSR, 64 bpm, left axis deviation, LVH with early repolarization abnormalities and unchanged from prior tracings   Recent Labs: 06/11/2019: BUN 19; Creatinine, Ser 1.52; Potassium 4.8; Sodium 141 07/18/2019: ALT 8  Recent Lipid Panel    Component Value Date/Time   CHOL 188 03/22/2018 1433   TRIG 242.0 (H) 03/22/2018 1433   HDL 43.90 03/22/2018 1433   CHOLHDL 4 03/22/2018 1433   VLDL 48.4 (H) 03/22/2018 1433   LDLDIRECT 98.0 03/22/2018 1433    PHYSICAL EXAM:    VS:  BP (!) 166/100 (BP Location: Left Arm, Patient Position: Sitting, Cuff Size: Normal)   Pulse 64   Ht 6\' 2"  (1.88 m)   Wt 192 lb 6.4 oz  (87.3 kg)   SpO2 97%   BMI 24.70 kg/m   BMI: Body mass index is 24.7 kg/m.  Physical Exam Constitutional:      Appearance: He is well-developed.  HENT:     Head: Normocephalic and atraumatic.  Eyes:     General:        Right eye: No discharge.        Left eye: No discharge.  Neck:  Vascular: No JVD.  Cardiovascular:     Rate and Rhythm: Normal rate and regular rhythm.     Pulses: No midsystolic click and no opening snap.          Posterior tibial pulses are 2+ on the right side and 2+ on the left side.     Heart sounds: Normal heart sounds, S1 normal and S2 normal. Heart sounds not distant. No murmur heard.  No friction rub.  Pulmonary:     Effort: Pulmonary effort is normal. No respiratory distress.     Breath sounds: Normal breath sounds. No decreased breath sounds, wheezing or rales.  Chest:     Chest wall: No tenderness.  Abdominal:     General: There is no distension.     Palpations: Abdomen is soft.     Tenderness: There is no abdominal tenderness.  Musculoskeletal:     Cervical back: Normal range of motion.  Skin:    General: Skin is warm and dry.     Nails: There is no clubbing.  Neurological:     Mental Status: He is alert and oriented to person, place, and time.  Psychiatric:        Speech: Speech normal.        Behavior: Behavior normal.        Thought Content: Thought content normal.        Judgment: Judgment normal.     Wt Readings from Last 3 Encounters:  11/07/19 192 lb 6.4 oz (87.3 kg)  07/02/19 187 lb 3.2 oz (84.9 kg)  06/11/19 188 lb (85.3 kg)     ASSESSMENT & PLAN:   1. HFrEF secondary to NICM: He is euvolemic and well compensated.  NYHA class I symptoms.  Titrate Entresto to 95/103 mg twice daily.  Otherwise, he will continue current dose carvedilol.  Spironolactone has previously been deferred in the setting of underlying CKD.  Check BMP today.  If renal function allows, recommend considering adding spironolactone and follow-up.  After  his medical therapy has been optimized consider repeat limited echo in several months time.  Should his cardiomyopathy persist with an LV SF less than 35% at that time consider referral to EP for consideration of ICD for primary prevention.  CHF education.   2. HTN: Blood pressure is suboptimally controlled at triage this morning with a reading of 166/100.  Recheck BP of 160/98.  Increase Entresto to 97/103 mg bid.  Otherwise, he will continue carvedilol as outlined above.  3. CKD stage II: Check BMP.  4. PVCs: Resolved with Coreg.   5. Dilated aortic root: Mildly dilated on echo from 04/2019.  Optimal blood pressure control is recommended.  Follow-up with periodic echo.  6. COPD with ongoing tobacco use: Complete cessation is recommended.  Refilled his albuterol and Symbicort x 1 today.  Further refills to come from his PCP.   Disposition: F/u with Dr. Fletcher Anon or an APP in 3 months.   Medication Adjustments/Labs and Tests Ordered: Current medicines are reviewed at length with the patient today.  Concerns regarding medicines are outlined above. Medication changes, Labs and Tests ordered today are summarized above and listed in the Patient Instructions accessible in Encounters.   Signed, [name], PA-C 11/07/2019 10:40 AM     [facility] [address], [place] [address] [phone]

## 2019-11-07 ENCOUNTER — Encounter: Payer: Self-pay | Admitting: Physician Assistant

## 2019-11-07 ENCOUNTER — Ambulatory Visit (INDEPENDENT_AMBULATORY_CARE_PROVIDER_SITE_OTHER): Payer: 59 | Admitting: Physician Assistant

## 2019-11-07 ENCOUNTER — Other Ambulatory Visit: Payer: Self-pay

## 2019-11-07 VITALS — BP 166/100 | HR 64 | Ht 74.0 in | Wt 192.4 lb

## 2019-11-07 DIAGNOSIS — I5022 Chronic systolic (congestive) heart failure: Secondary | ICD-10-CM

## 2019-11-07 DIAGNOSIS — J449 Chronic obstructive pulmonary disease, unspecified: Secondary | ICD-10-CM | POA: Diagnosis not present

## 2019-11-07 DIAGNOSIS — N182 Chronic kidney disease, stage 2 (mild): Secondary | ICD-10-CM | POA: Diagnosis not present

## 2019-11-07 DIAGNOSIS — Z720 Tobacco use: Secondary | ICD-10-CM

## 2019-11-07 DIAGNOSIS — I493 Ventricular premature depolarization: Secondary | ICD-10-CM

## 2019-11-07 DIAGNOSIS — I10 Essential (primary) hypertension: Secondary | ICD-10-CM

## 2019-11-07 DIAGNOSIS — I77810 Thoracic aortic ectasia: Secondary | ICD-10-CM

## 2019-11-07 MED ORDER — ALBUTEROL SULFATE HFA 108 (90 BASE) MCG/ACT IN AERS: 2.0000 | INHALATION_SPRAY | Freq: Four times a day (QID) | RESPIRATORY_TRACT | 0 refills | Status: DC | PRN

## 2019-11-07 MED ORDER — ENTRESTO 97-103 MG PO TABS: 1.0000 | ORAL_TABLET | Freq: Two times a day (BID) | ORAL | 6 refills | Status: DC

## 2019-11-07 MED ORDER — BUDESONIDE-FORMOTEROL FUMARATE 160-4.5 MCG/ACT IN AERO: INHALATION_SPRAY | RESPIRATORY_TRACT | 0 refills | Status: DC

## 2019-11-07 NOTE — Patient Instructions (Signed)
Medication Instructions:  1- INCREASE Entresto Take 1 tablet by mouth 2 (two) times daily *If you need a refill on your cardiac medications before your next appointment, please call your pharmacy*   Lab Work: 1- Your physician recommends that you have lab work today(BMET)  If you have labs (blood work) drawn today and your tests are completely normal, you will receive your results only by: [name] MyChart Message (if you have MyChart) OR . A paper copy in the mail If you have any lab test that is abnormal or we need to change your treatment, we will call you to review the results.   Testing/Procedures: None ordered   Follow-Up: At [facility], you and your health needs are our priority.  As part of our continuing mission to provide you with exceptional heart care, we have created designated Provider Care Teams.  These Care Teams include your primary Cardiologist (physician) and Advanced Practice Providers (APPs -  Physician Assistants and Nurse Practitioners) who all work together to provide you with the care you need, when you need it.  We recommend signing up for the patient portal called "MyChart".  Sign up information is provided on this After Visit Summary.  MyChart is used to connect with patients for Virtual Visits (Telemedicine).  Patients are able to view lab/test results, encounter notes, upcoming appointments, etc.  Non-urgent messages can be sent to your provider as well.   To learn more about what you can do with MyChart, go to [email or web address].    Your next appointment:   3 month(s)  The format for your next appointment:   In Person  Provider:     You may see [name], MD or [name], PA-C

## 2019-11-08 LAB — BASIC METABOLIC PANEL
BUN/Creatinine Ratio: 13 (ref 10–24)
BUN: 20 mg/dL (ref 8–27)
CO2: 22 mmol/L (ref 20–29)
Calcium: 9.2 mg/dL (ref 8.6–10.2)
Chloride: 103 mmol/L (ref 96–106)
Creatinine, Ser: 1.54 mg/dL — ABNORMAL HIGH (ref 0.76–1.27)
GFR calc Af Amer: 53 mL/min/{1.73_m2} — ABNORMAL LOW (ref >59)
GFR calc non Af Amer: 46 mL/min/{1.73_m2} — ABNORMAL LOW (ref >59)
Glucose: 96 mg/dL (ref 65–99)
Potassium: 4.3 mmol/L (ref 3.5–5.2)
Sodium: 142 mmol/L (ref 134–144)

## 2019-11-08 NOTE — Addendum Note (Signed)
Addended by: Ronaldo Miyamoto on: 11/08/2019 02:11 PM   Modules accepted: Orders

## 2020-01-03 NOTE — Telephone Encounter (Signed)
Closing encounter

## 2020-01-06 ENCOUNTER — Other Ambulatory Visit: Payer: Self-pay

## 2020-01-06 ENCOUNTER — Encounter: Payer: Self-pay | Admitting: Family

## 2020-01-06 ENCOUNTER — Ambulatory Visit (INDEPENDENT_AMBULATORY_CARE_PROVIDER_SITE_OTHER): Payer: 59 | Admitting: Family

## 2020-01-06 VITALS — BP 144/100 | HR 70 | Temp 98.5°F | Ht 74.0 in | Wt 188.8 lb

## 2020-01-06 DIAGNOSIS — Z720 Tobacco use: Secondary | ICD-10-CM | POA: Diagnosis not present

## 2020-01-06 DIAGNOSIS — I10 Essential (primary) hypertension: Secondary | ICD-10-CM | POA: Diagnosis not present

## 2020-01-06 DIAGNOSIS — I5022 Chronic systolic (congestive) heart failure: Secondary | ICD-10-CM

## 2020-01-06 MED ORDER — VARENICLINE TARTRATE 1 MG PO TABS: ORAL_TABLET | ORAL | 0 refills | Status: DC

## 2020-01-06 MED ORDER — VARENICLINE TARTRATE 0.5 MG PO TABS: ORAL_TABLET | ORAL | 0 refills | Status: DC

## 2020-01-06 NOTE — Progress Notes (Signed)
Subjective:    Patient ID: Jonathon Rios, male    DOB: [birth year], 69 y.o.   MRN: [id number]  CC: Jonathon Rios is a 69 y.o. male who presents today for follow up.   HPI: Interested in quitting smoking. Smokes half per day. Smoked 2 cigarettes on way the way here. Endorses chronic cough.   Smokes with meal or when bored or driving.   Never tried chantix, wellbutrin.   No alcohol H/o seizure and thinks related to drug use.   Has been treated for depression and anxiety in the past. H/o history suicide attempt when using drugs and 'due to shame', hospitalized at this time. No current depression. No thoughts of hurting himself or attempts of hurting himself since he has been sober the last 4 years.  HTN- has bp cuff home however hasnt taken BP at home.  No cp, sob, leg swelling. Weight is stable. Compliant with coreg, entresto.      CKD- No NSAIDs   Hep C undectable 06/2019; will monitor liver enzymes q6 months  Seen by [name] 10/2019 for HFrEF whom [place]resto to [address] BID. Continue coreg  Considered adding spironolactone.    HISTORY:  Past Medical History:  Diagnosis Date  . Alcoholism and drug addiction in family   . Arthritis   . CHF (congestive heart failure) ([place])   . Coronary artery disease   . Heart disease   . Hypertension   . Irregular heart beat   . Positive TB test   . Seizure [facility])    witnessed by family in restaurant   Past Surgical History:  Procedure Laterality Date  . CARDIAC CATHETERIZATION    . TONSILLECTOMY     Family History  Problem Relation Age of Onset  . Hypertension Mother   . Diabetes Mother   . Alcohol abuse Mother   . Hyperlipidemia Mother   . Hypertension Sister   . Alcohol abuse Sister   . Asthma Sister   . Depression Sister   . Drug abuse Sister   . Alcohol abuse Father   . Hypertension Father   . Hypertension Daughter   . Early death Daughter   . Drug abuse Daughter   . Depression Daughter   . Alcohol abuse  Daughter     Allergies: Penicillins Current Outpatient Medications on File Prior to Visit  Medication Sig Dispense Refill  . albuterol (PROAIR HFA) 108 (90 Base) MCG/ACT inhaler Inhale 2 puffs into the lungs every 6 (six) hours as needed for wheezing or shortness of breath. 1 each 0  . aspirin EC 81 MG tablet Take 1 tablet (81 mg total) by mouth daily.    . budesonide-formoterol (SYMBICORT) 160-4.5 MCG/ACT inhaler TAKE 2 PUFFS BY MOUTH TWICE A DAY 1 each 0  . clotrimazole (LOTRIMIN) 1 % cream APPLY TO AFFECTED AREA TWICE A DAY 30 g 2  . sacubitril-valsartan (ENTRESTO) 97-103 MG Take 1 tablet by mouth 2 (two) times daily. 60 tablet 6   No current facility-administered medications on file prior to visit.    Social History   Tobacco Use  . Smoking status: Current Some Day Smoker    Packs/day: 1.00    Years: 50.00    Pack years: 50.00    Types: Cigarettes    Last attempt to quit: 02/23/2017    Years since quitting: 2.8  . Smokeless tobacco: Never Used  Vaping Use  . Vaping Use: Never used  Substance Use Topics  . Alcohol use: No  .  Drug use: Not Currently    Types: "Crack" cocaine, Heroin, Marijuana, LSD    Comment: Sober since 2018.     Review of Systems  Constitutional: Negative for chills and fever.  Eyes: Negative for visual disturbance.  Respiratory: Positive for cough. Negative for shortness of breath.   Cardiovascular: Negative for chest pain, palpitations and leg swelling.  Gastrointestinal: Negative for nausea and vomiting.  Neurological: Negative for dizziness and headaches.  Psychiatric/Behavioral: Negative for sleep disturbance and suicidal ideas. The patient is not nervous/anxious.       Objective:    BP (!) 144/100 (BP Location: Left Arm, Cuff Size: Large)   Pulse 70   Temp 98.5 F (36.9 C)   Ht 6\' 2"  (1.88 m)   Wt 188 lb 12.8 oz (85.6 kg)   SpO2 99%   BMI 24.24 kg/m  BP Readings from Last 3 Encounters:  01/06/20 (!) 144/100  11/07/19 (!) 166/100    07/02/19 138/84   Wt Readings from Last 3 Encounters:  01/06/20 188 lb 12.8 oz (85.6 kg)  11/07/19 192 lb 6.4 oz (87.3 kg)  07/02/19 187 lb 3.2 oz (84.9 kg)    Physical Exam Vitals reviewed.  Constitutional:      Appearance: He is well-developed.  HENT:     Right Ear: Hearing normal.     Left Ear: Hearing normal.     Mouth/Throat:     Pharynx: Uvula midline. No posterior oropharyngeal erythema.  Eyes:     General: Lids are normal. Lids are everted, no foreign bodies appreciated.     Conjunctiva/sclera: Conjunctivae normal.     Pupils: Pupils are equal, round, and reactive to light.     Comments: Normal fundus bilaterally.  Cardiovascular:     Rate and Rhythm: Regular rhythm.     Heart sounds: Normal heart sounds.  Pulmonary:     Effort: Pulmonary effort is normal. No respiratory distress.     Breath sounds: Normal breath sounds. No wheezing, rhonchi or rales.  Lymphadenopathy:     Head:     Right side of head: No submental, submandibular, tonsillar, preauricular, posterior auricular or occipital adenopathy.     Left side of head: No submental, submandibular, tonsillar, preauricular, posterior auricular or occipital adenopathy.     Cervical: No cervical adenopathy.  Skin:    General: Skin is warm and dry.  Neurological:     Mental Status: He is alert.     Cranial Nerves: No cranial nerve deficit.     Sensory: No sensory deficit.     Deep Tendon Reflexes:     Reflex Scores:      Bicep reflexes are 2+ on the right side and 2+ on the left side.      Patellar reflexes are 2+ on the right side and 2+ on the left side.    Comments: Grip equal and strong bilateral upper extremities. Gait strong and steady. Able to perform rapid alternating movement without difficulty.  Psychiatric:        Speech: Speech normal.        Behavior: Behavior normal.        Assessment & Plan:   Problem List Items Addressed This Visit      Cardiovascular and Mediastinum   CHF (congestive  heart failure) ([place])    Appears euvolemic. Continue entresto, coreg as prescribed by cardiology.      HTN (hypertension) - Primary (Chronic)    Uncontrolled. Normal neurologic exam and no signs or symptoms of hypertension urgency  or emergency at this time. Consulted Corporate investment banker) with [name] whom advised increasing Coreg to 25mg  BID. Telephone call out to patient.       Relevant Orders   Lipid panel (Completed)   Comprehensive metabolic panel (Completed)   Hemoglobin A1c (Completed)     Other   Tobacco use    Discussed at great length Chantix and black box warning in regards to suicidallity. 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 He has no thoughts of hurting himself or anyone else and understands the importance of staying hypervigilant for any unusual thoughts and to call the office immediately. Trial of chantix 0.5mg  to increase to chantix 1mg . Close follow up.      Relevant Medications   varenicline (CHANTIX) 0.5 MG tablet   varenicline (CHANTIX) 1 MG tablet       I am having Jonathon Rios start on varenicline and varenicline. I am also having him maintain his aspirin EC, clotrimazole, Entresto, albuterol, and budesonide-formoterol.   Meds ordered this encounter  Medications  . varenicline (CHANTIX) 0.5 MG tablet    Sig: Days 1 to 3: 0.5 mg by mouth once daily then Days 4 to 7: 0.5 mg by mouth twice daily    Dispense:  11 tablet    Refill:  0    Order Specific Question:   Supervising Provider    Answer:   Deborra Medina L [[id number]]  . varenicline (CHANTIX) 1 MG tablet    Sig: After completing 0.5mg  chantix, please start chantix 1mg  PO BID for 11 weeks.    Dispense:  154 tablet    Refill:  0    Order Specific Question:   Supervising Provider    Answer:   Crecencio Mc [[id number]]    Return precautions given.   Risks, benefits, and alternatives of the medications and treatment plan prescribed today were  discussed, and patient expressed understanding.   Education regarding symptom management and diagnosis given to patient on AVS.  Continue to follow with [name], FNP for routine health maintenance.   [name] and I agreed with plan.   Mable Paris, FNP

## 2020-01-06 NOTE — Patient Instructions (Addendum)
The below is how you take Chantix.  Please tell family , close that you are starting Chantix so they can not only support you however also make sure you do not show any unsual thoughts, behavior- this is rare however I want you to be vigilant.    Make a plan to slowly decrease smoking as you are on the chantix until your quit date. ( see below for detailed instructions).    Be mindful of  common side effects- mainly GI upset, so please TAKE with FOOD. You may also have strange dreams, however this too is less common.   Select a quit date within 7 days of starting Chantix. Goal is you should stop smoking within 8 to 35 days of starting chantix  I have sent in two different chantix prescriptions ; one 0.5mg  dose and the other 1mg  dose. Please see instructions below and call with ANY questions.    Initial: Days 1 to 3: 0.5 mg by mouth once daily Days 4 to 7: 0.5 mg by mouth twice daily   Maintenance (? Day 8): 1 mg by mouth twice daily for 11 weeks. We may consider a temporary or permanent dose reduction if usual dose of 1 mg twice per day is not tolerated.   Slow decrease of smoking:   If you are not able or willing to quit abruptly, begin treatment with vareniciline ( Chantix) and reduce smoking by 50% from baseline within the first 4 weeks, by an additional 50% in the next 4 weeks, and continue reducing with the goal of complete abstinence by 12 weeks. If successfully quits smoking at the end of the 12 weeks, may continue for another 12 weeks to help maintain success. If you are motivated to quit and do not succeed in stopping smoking during prior therapy, or relapse after treatment, I would encourage you to make another attempt with varenicline ( Chantix) once factors contributing to the failed attempt have been identified and addressed.   It is imperative that you are seen AT least twice per year for labs and monitoring.   Monitor blood pressure at home and me 5-6 reading on separate  days. Goal is less than 120/80, based on newest guidelines, however we certainly want to be less than 130/80;  if persistently higher, please make sooner follow up appointment so we can recheck you blood pressure and manage/ adjust medications.    Managing Your Hypertension Hypertension is commonly called high blood pressure. This is when the force of your blood pressing against the walls of your arteries is too strong. Arteries are blood vessels that carry blood from your heart throughout your body. Hypertension forces the heart to work harder to pump blood, and may cause the arteries to become narrow or stiff. Having untreated or uncontrolled hypertension can cause heart attack, stroke, kidney disease, and other problems. What are blood pressure readings? A blood pressure reading consists of a higher number over a lower number. Ideally, your blood pressure should be below 120/80. The first ("top") number is called the systolic pressure. It is a measure of the pressure in your arteries as your heart beats. The second ("bottom") number is called the diastolic pressure. It is a measure of the pressure in your arteries as the heart relaxes. What does my blood pressure reading mean? Blood pressure is classified into four stages. Based on your blood pressure reading, your health care provider may use the following stages to determine what type of treatment you need, if any.  Systolic pressure and diastolic pressure are measured in a unit called mm Hg. Normal  Systolic pressure: below 709.  Diastolic pressure: below 80. Elevated  Systolic pressure: 628-366.  Diastolic pressure: below 80. Hypertension stage 1  Systolic pressure: 294-765.  Diastolic pressure: 46-50. Hypertension stage 2  Systolic pressure: 354 or above.  Diastolic pressure: 90 or above. What health risks are associated with hypertension? Managing your hypertension is an important responsibility. Uncontrolled hypertension can  lead to:  A heart attack.  A stroke.  A weakened blood vessel (aneurysm).  Heart failure.  Kidney damage.  Eye damage.  Metabolic syndrome.  Memory and concentration problems. What changes can I make to manage my hypertension? Hypertension can be managed by making lifestyle changes and possibly by taking medicines. Your health care provider will help you make a plan to bring your blood pressure within a normal range. Eating and drinking   Eat a diet that is high in fiber and potassium, and low in salt (sodium), added sugar, and fat. An example eating plan is called the DASH (Dietary Approaches to Stop Hypertension) diet. To eat this way: ? Eat plenty of fresh fruits and vegetables. Try to fill half of your plate at each meal with fruits and vegetables. ? Eat whole grains, such as whole wheat pasta, brown rice, or whole grain bread. Fill about one quarter of your plate with whole grains. ? Eat low-fat diary products. ? Avoid fatty cuts of meat, processed or cured meats, and poultry with skin. Fill about one quarter of your plate with lean proteins such as fish, chicken without skin, beans, eggs, and tofu. ? Avoid premade and processed foods. These tend to be higher in sodium, added sugar, and fat.  Reduce your daily sodium intake. Most people with hypertension should eat less than 1,500 mg of sodium a day.  Limit alcohol intake to no more than 1 drink a day for nonpregnant women and 2 drinks a day for men. One drink equals 12 oz of beer, 5 oz of wine, or 1 oz of hard liquor. Lifestyle  Work with your health care provider to maintain a healthy body weight, or to lose weight. Ask what an ideal weight is for you.  Get at least 30 minutes of exercise that causes your heart to beat faster (aerobic exercise) most days of the week. Activities may include walking, swimming, or biking.  Include exercise to strengthen your muscles (resistance exercise), such as weight lifting, as part of  your weekly exercise routine. Try to do these types of exercises for 30 minutes at least 3 days a week.  Do not use any products that contain nicotine or tobacco, such as cigarettes and e-cigarettes. If you need help quitting, ask your health care provider.  Control any long-term (chronic) conditions you have, such as high cholesterol or diabetes. Monitoring  Monitor your blood pressure at home as told by your health care provider. Your personal target blood pressure may vary depending on your medical conditions, your age, and other factors.  Have your blood pressure checked regularly, as often as told by your health care provider. Working with your health care provider  Review all the medicines you take with your health care provider because there may be side effects or interactions.  Talk with your health care provider about your diet, exercise habits, and other lifestyle factors that may be contributing to hypertension.  Visit your health care provider regularly. Your health care provider can help you create and adjust  your plan for managing hypertension. Will I need medicine to control my blood pressure? Your health care provider may prescribe medicine if lifestyle changes are not enough to get your blood pressure under control, and if:  Your systolic blood pressure is 130 or higher.  Your diastolic blood pressure is 80 or higher. Take medicines only as told by your health care provider. Follow the directions carefully. Blood pressure medicines must be taken as prescribed. The medicine does not work as well when you skip doses. Skipping doses also puts you at risk for problems. Contact a health care provider if:  You think you are having a reaction to medicines you have taken.  You have repeated (recurrent) headaches.  You feel dizzy.  You have swelling in your ankles.  You have trouble with your vision. Get help right away if:  You develop a severe headache or  confusion.  You have unusual weakness or numbness, or you feel faint.  You have severe pain in your chest or abdomen.  You vomit repeatedly.  You have trouble breathing. Summary  Hypertension is when the force of blood pumping through your arteries is too strong. If this condition is not controlled, it may put you at risk for serious complications.  Your personal target blood pressure may vary depending on your medical conditions, your age, and other factors. For most people, a normal blood pressure is less than 120/80.  Hypertension is managed by lifestyle changes, medicines, or both. Lifestyle changes include weight loss, eating a healthy, low-sodium diet, exercising more, and limiting alcohol. This information is not intended to replace advice given to you by your health care provider. Make sure you discuss any questions you have with your health care provider. Document Revised: 06/08/2018 Document Reviewed: 01/13/2016 Elsevier Patient Education  [place].

## 2020-01-07 ENCOUNTER — Other Ambulatory Visit (INDEPENDENT_AMBULATORY_CARE_PROVIDER_SITE_OTHER): Payer: 59

## 2020-01-07 DIAGNOSIS — I10 Essential (primary) hypertension: Secondary | ICD-10-CM

## 2020-01-07 LAB — COMPREHENSIVE METABOLIC PANEL
ALT: 6 U/L (ref 0–53)
AST: 10 U/L (ref 0–37)
Albumin: 4 g/dL (ref 3.5–5.2)
Alkaline Phosphatase: 59 U/L (ref 39–117)
BUN: 31 mg/dL — ABNORMAL HIGH (ref 6–23)
CO2: 30 mEq/L (ref 19–32)
Calcium: 9 mg/dL (ref 8.4–10.5)
Chloride: 102 mEq/L (ref 96–112)
Creatinine, Ser: 1.58 mg/dL — ABNORMAL HIGH (ref 0.40–1.50)
GFR: 45.08 mL/min — ABNORMAL LOW (ref >60.00)
Glucose, Bld: 98 mg/dL (ref 70–99)
Potassium: 4.2 mEq/L (ref 3.5–5.1)
Sodium: 136 mEq/L (ref 135–145)
Total Bilirubin: 0.7 mg/dL (ref 0.2–1.2)
Total Protein: 7.3 g/dL (ref 6.0–8.3)

## 2020-01-07 LAB — LIPID PANEL
Cholesterol: 183 mg/dL (ref 0–200)
HDL: 42.3 mg/dL (ref >39.00)
LDL Cholesterol: 118 mg/dL — ABNORMAL HIGH (ref 0–99)
NonHDL: 140.96
Total CHOL/HDL Ratio: 4
Triglycerides: 117 mg/dL (ref 0.0–149.0)
VLDL: 23.4 mg/dL (ref 0.0–40.0)

## 2020-01-07 LAB — HEMOGLOBIN A1C: Hgb A1c MFr Bld: 5.7 % (ref 4.6–6.5)

## 2020-01-08 ENCOUNTER — Telehealth: Payer: Self-pay | Admitting: Family

## 2020-01-08 ENCOUNTER — Other Ambulatory Visit: Payer: Self-pay | Admitting: Family

## 2020-01-08 ENCOUNTER — Other Ambulatory Visit: Payer: Self-pay

## 2020-01-08 ENCOUNTER — Telehealth: Payer: Self-pay

## 2020-01-08 DIAGNOSIS — Z720 Tobacco use: Secondary | ICD-10-CM

## 2020-01-08 MED ORDER — ROSUVASTATIN CALCIUM 10 MG PO TABS: 10.0000 mg | ORAL_TABLET | Freq: Every day | ORAL | 3 refills | Status: DC

## 2020-01-08 MED ORDER — CARVEDILOL 25 MG PO TABS: 25.0000 mg | ORAL_TABLET | Freq: Two times a day (BID) | ORAL | 3 refills | Status: DC

## 2020-01-08 NOTE — Telephone Encounter (Signed)
LMTCB

## 2020-01-08 NOTE — Telephone Encounter (Signed)
Patient would like to be referred for lung cancer screening program. He also was interested in you sending in the Wellbutrin for smoking cessation.

## 2020-01-08 NOTE — Telephone Encounter (Signed)
LMTCB to see if patient is interested in annual lung cancer screenings.

## 2020-01-08 NOTE — Telephone Encounter (Signed)
Sarah  Call pt Consulted with ryan dunn and we agreed to increase coreg to 25mg  taken twice per day with meals  Goal of blood pressure is to approach 130/80  FYI ryan, if you want your office to call him for follow up next week, that would be great. Thanks for your advice here.

## 2020-01-08 NOTE — Assessment & Plan Note (Signed)
Discussed at great length Chantix and black box warning in regards to suicidallity. 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 He has no thoughts of hurting himself or anyone else and understands the importance of staying hypervigilant for any unusual thoughts and to call the office immediately. Trial of chantix 0.5mg  to increase to chantix 1mg . Close follow up.

## 2020-01-08 NOTE — Telephone Encounter (Signed)
Jonathon Rios let patient know that he cannot take wellbutrin; I have sent in chantix

## 2020-01-08 NOTE — Progress Notes (Signed)
Ct lung cancer screen ordered

## 2020-01-08 NOTE — Assessment & Plan Note (Addendum)
Uncontrolled. Normal neurologic exam and no signs or symptoms of hypertension urgency or emergency at this time. Consulted Corporate investment banker) with [name] whom advised increasing Coreg to 25mg  BID. Telephone call out to patient.

## 2020-01-08 NOTE — Telephone Encounter (Signed)
Moved patient's appointment up to see Jonathon Faith, PA next Wednesday at 29.

## 2020-01-08 NOTE — Telephone Encounter (Signed)
-----   Message from [name], [place] sent at 01/08/2020  8:06 AM EST ----- Call ptI meant to ask him if he would like to start annual lung cancer screening program. This is free, preventative screen for all current and former smokers done every year. I can place referral to program and every year he will be contacted until 69 years old for screening. Let me know and I will order

## 2020-01-08 NOTE — Assessment & Plan Note (Signed)
Appears euvolemic. Continue entresto, coreg as prescribed by cardiology.

## 2020-01-08 NOTE — Telephone Encounter (Signed)
Please schedule patient to see me in the office next week. Thanks!

## 2020-01-08 NOTE — Telephone Encounter (Signed)
Pt called back returning your call about multiply things left on message s

## 2020-01-08 NOTE — Telephone Encounter (Signed)
FYI patient knows to pick up carvedilol. He knows to be on the lookout for a call from his office to move appointment up.
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
## 2020-01-14 ENCOUNTER — Other Ambulatory Visit: Payer: Self-pay | Admitting: Physician Assistant

## 2020-01-14 ENCOUNTER — Telehealth: Payer: Self-pay

## 2020-01-14 DIAGNOSIS — J449 Chronic obstructive pulmonary disease, unspecified: Secondary | ICD-10-CM

## 2020-01-14 NOTE — Telephone Encounter (Signed)
Contacted patient for lung CT lung screening program based on referral from Mable Paris, NP.  Patient remembers discussing this with NP at his last visit.  I explained the program to him and he would like to think about if he would like to enroll.  I have given him phone number to Burgess Estelle, lung navigator for patient to call when he makes a decision.

## 2020-01-15 ENCOUNTER — Other Ambulatory Visit: Payer: Self-pay

## 2020-01-15 ENCOUNTER — Ambulatory Visit (INDEPENDENT_AMBULATORY_CARE_PROVIDER_SITE_OTHER): Payer: 59 | Admitting: Physician Assistant

## 2020-01-15 ENCOUNTER — Telehealth: Payer: Self-pay | Admitting: Physician Assistant

## 2020-01-15 ENCOUNTER — Encounter: Payer: Self-pay | Admitting: Physician Assistant

## 2020-01-15 VITALS — BP 144/104 | HR 69 | Ht 74.0 in | Wt 190.0 lb

## 2020-01-15 DIAGNOSIS — I5022 Chronic systolic (congestive) heart failure: Secondary | ICD-10-CM | POA: Diagnosis not present

## 2020-01-15 DIAGNOSIS — J449 Chronic obstructive pulmonary disease, unspecified: Secondary | ICD-10-CM

## 2020-01-15 DIAGNOSIS — I110 Hypertensive heart disease with heart failure: Secondary | ICD-10-CM | POA: Diagnosis not present

## 2020-01-15 DIAGNOSIS — I428 Other cardiomyopathies: Secondary | ICD-10-CM

## 2020-01-15 DIAGNOSIS — I493 Ventricular premature depolarization: Secondary | ICD-10-CM

## 2020-01-15 DIAGNOSIS — N182 Chronic kidney disease, stage 2 (mild): Secondary | ICD-10-CM

## 2020-01-15 DIAGNOSIS — I77810 Thoracic aortic ectasia: Secondary | ICD-10-CM

## 2020-01-15 DIAGNOSIS — Z720 Tobacco use: Secondary | ICD-10-CM

## 2020-01-15 MED ORDER — HYDRALAZINE HCL 50 MG PO TABS: 50.0000 mg | ORAL_TABLET | Freq: Two times a day (BID) | ORAL | 5 refills | Status: DC

## 2020-01-15 NOTE — Patient Instructions (Signed)
Medication Instructions:  Start taking Hydralazine 50mg  by mouth twice a day  *If you need a refill on your cardiac medications before your next appointment, please call your pharmacy*   Lab Work: none If you have labs (blood work) drawn today and your tests are completely normal, you will receive your results only by:  [place] (if you have MyChart) OR  A paper copy in the mail If you have any lab test that is abnormal or we need to change your treatment, we will call you to review the results.   Testing/Procedures: none   Follow-Up: At [facility], you and your health needs are our priority.  As part of our continuing mission to provide you with exceptional heart care, we have created designated Provider Care Teams.  These Care Teams include your primary Cardiologist (physician) and Advanced Practice Providers (APPs -  Physician Assistants and Nurse Practitioners) who all work together to provide you with the care you need, when you need it.  We recommend signing up for the patient portal called "MyChart".  Sign up information is provided on this After Visit Summary.  MyChart is used to connect with patients for Virtual Visits (Telemedicine).  Patients are able to view lab/test results, encounter notes, upcoming appointments, etc.  Non-urgent messages can be sent to your provider as well.   To learn more about what you can do with MyChart, go to [email or web address].    Your next appointment:   1 month(s)  The format for your next appointment:   In Person  Provider:   [name], PA-C   Other Instructions n/a

## 2020-01-15 NOTE — Telephone Encounter (Signed)
°  Patient Consent for Virtual Visit         Jonathon Rios has provided verbal consent on 01/15/2020 for a virtual visit (video or telephone).   CONSENT FOR VIRTUAL VISIT FOR:  Jonathon Rios  By participating in this virtual visit I agree to the following:  I hereby voluntarily request, consent and authorize [facility] and its employed or contracted physicians, physician assistants, nurse practitioners or other licensed health care professionals (the Practitioner), to provide me with telemedicine health care services (the Services") as deemed necessary by the treating Practitioner. I acknowledge and consent to receive the Services by the Practitioner via telemedicine. I understand that the telemedicine visit will involve communicating with the Practitioner through live audiovisual communication technology and the disclosure of certain medical information by electronic transmission. I acknowledge that I have been given the opportunity to request an in-person assessment or other available alternative prior to the telemedicine visit and am voluntarily participating in the telemedicine visit.  I understand that I have the right to withhold or withdraw my consent to the use of telemedicine in the course of my care at any time, without affecting my right to future care or treatment, and that the Practitioner or I may terminate the telemedicine visit at any time. I understand that I have the right to inspect all information obtained and/or recorded in the course of the telemedicine visit and may receive copies of available information for a reasonable fee.  I understand that some of the potential risks of receiving the Services via telemedicine include:   Delay or interruption in medical evaluation due to technological equipment failure or disruption;  Information transmitted may not be sufficient (e.g. poor resolution of images) to allow for appropriate medical decision making by the  Practitioner; and/or   In rare instances, security protocols could fail, causing a breach of personal health information.  Furthermore, I acknowledge that it is my responsibility to provide information about my medical history, conditions and care that is complete and accurate to the best of my ability. I acknowledge that Practitioner's advice, recommendations, and/or decision may be based on factors not within their control, such as incomplete or inaccurate data provided by me or distortions of diagnostic images or specimens that may result from electronic transmissions. I understand that the practice of medicine is not an exact science and that Practitioner makes no warranties or guarantees regarding treatment outcomes. I acknowledge that a copy of this consent can be made available to me via my patient portal ([name]), or I can request a printed copy by calling the office of [facility].    I understand that my insurance will be billed for this visit.   I have read or had this consent read to me.  I understand the contents of this consent, which adequately explains the benefits and risks of the Services being provided via telemedicine.   I have been provided ample opportunity to ask questions regarding this consent and the Services and have had my questions answered to my satisfaction.  I give my informed consent for the services to be provided through the use of telemedicine in my medical care

## 2020-01-29 ENCOUNTER — Telehealth: Payer: Self-pay | Admitting: Family

## 2020-01-29 NOTE — Telephone Encounter (Signed)
Left message for patient to call back and schedule Medicare Annual Wellness Visit (AWV)  ° °This should be a telephone visit only=30 minutes. ° °No hx of AWV; please schedule at anytime with Denisa O'Brien-Blaney at [place] ° ° °

## 2020-02-06 ENCOUNTER — Ambulatory Visit: Payer: 59 | Admitting: Physician Assistant

## 2020-02-06 NOTE — Progress Notes (Signed)
Virtual Visit via Telephone Note   This visit type was conducted due to national recommendations for restrictions regarding the COVID-19 Pandemic (e.g. social distancing) in an effort to limit this patient's exposure and mitigate transmission in our community.  Due to his co-morbid illnesses, this patient is at least at moderate risk for complications without adequate follow up.  This format is felt to be most appropriate for this patient at this time.  The patient did not have access to video technology/had technical difficulties with video requiring transitioning to audio format only (telephone).  All issues noted in this document were discussed and addressed.  No physical exam could be performed with this format.  Please refer to the patient's chart for his  consent to telehealth for [facility].    Date:  02/07/2020   ID:  Jonathon Rios, DOB [birth year], MRN [id number] The patient was identified using 2 identifiers.  Patient Location: Home Provider Location: Office/Clinic  PCP:  [name], FNP  Cardiologist:  [name], MD  Electrophysiologist:  None   Evaluation Performed:  Follow-Up Visit  Chief Complaint:  Follow up  History of Present Illness:    Jonathon Rios is a 69 y.o. male with HFrEF secondary to NICM, hypertensive heart disease, CKD stage II, hepatitis C s/p therapy, priorpolysubstance use includingIVDUin the setting of significant trauma, excessive alcohol use, prior incarceration,mildly dilated aortic root,and tobacco use who presents for virtual follow up of his cardiomyopathy and hypertensive heart disease.  He has a history of heroin use in the 1970s, and more recently cocaine use. He has attended drug rehab, and has been drug free for over4years. He was diagnosed with HFrEF years ago while living in [place]. He had a cardiac cath done at that time and was told there was no significant CAD. Echo in 12/2016 showed an EF of 25-30%. Repeat echo  in 04/2019 showed an EF of 25-30%, global hypokinesis, Gr1DD, normal RVSF and RV cavity size, and a mildly dilated aortic root measuring 4 cm. When he was seen in 04/2019, his BP was poorly controlled at 160/100 and his lisinopril was changed to [facility]. He was seen by Marrianne Mood, PA-C in 05/2019 with a BP of 138/100, initially 180/110 and a stable weight. He indicated his elevated BP was in the setting of home stressors. He continued to smoke. Coreg was increased to 12.5 mg bid.  He was seen in the office in 10/2019 and was doing very well from a cardiac perspective.  BP was elevated at 166/102 though he reported well-controlled BPs at home.  Entresto was titrated to 95/103 mg twice daily.  He was seen by his PCP on 01/06/2020 with BP 144/100 with carvedilol being titrated to 25 mg twice daily.  I last saw him on 01/15/2020, for blood pressure follow-up, at which time he was doing well from a cardiac perspective.  His BP remained elevated at 144/104 with recheck at the end of his visit noted to be 141/92.  Hydralazine 50 mg twice daily was added to his regimen of carvedilol and Entresto.  He is doing well and virtual follow-up today.  He notes since starting hydralazine he has had bilateral upper and lower extremity stiffness/tightness.  He does not indicate this is swelling more so a tightness or stiffness.  His weight has remained stable.  He has a stable longstanding 3 pillow orthopnea.  No lower extremity swelling, abdominal distention, PND, or early satiety.  No chest pain, dyspnea, or palpitations.  He indicates his blood pressure has been running in the 140s over 90s at home.  He is watching his salt intake and drinks less than 2 L of fluid per day.  He is tolerating Entresto, carvedilol, and Crestor.  He continues to work on his smoking.   Labs independently reviewed: 12/2019 -TC 183, TG 170, HDL 42, LDL 118, potassium 4.2, BUN 31, serum creatinine 1.58, BUN 4.0, AST/ALT normal, A1c 5.7 02/2018 -  TSH normal, HGB 16.3, PLT 146  The patient does not have symptoms concerning for COVID-19 infection (fever, chills, cough, or new shortness of breath).    Past Medical History:  Diagnosis Date  . Alcoholism and drug addiction in family   . Arthritis   . CHF (congestive heart failure) ([place])   . Coronary artery disease   . Heart disease   . Hypertension   . Irregular heart beat   . Positive TB test   . Seizure [facility])    witnessed by family in restaurant   Past Surgical History:  Procedure Laterality Date  . CARDIAC CATHETERIZATION    . TONSILLECTOMY       Current Meds  Medication Sig  . albuterol (PROAIR HFA) 108 (90 Base) MCG/ACT inhaler Inhale 2 puffs into the lungs every 6 (six) hours as needed for wheezing or shortness of breath.  [name] aspirin EC 81 MG tablet Take 1 tablet (81 mg total) by mouth daily.  . budesonide-formoterol (SYMBICORT) 160-4.5 MCG/ACT inhaler TAKE 2 PUFFS BY MOUTH TWICE A DAY  . carvedilol (COREG) 25 MG tablet Take 1 tablet (25 mg total) by mouth 2 (two) times daily with a meal.  . clotrimazole (LOTRIMIN) 1 % cream APPLY TO AFFECTED AREA TWICE A DAY  . hydrALAZINE (APRESOLINE) 50 MG tablet Take 1 tablet (50 mg total) by mouth 2 (two) times daily.  . rosuvastatin (CRESTOR) 10 MG tablet Take 1 tablet (10 mg total) by mouth daily.  . sacubitril-valsartan (ENTRESTO) 97-103 MG Take 1 tablet by mouth 2 (two) times daily.     Allergies:   Penicillins   Social History   Tobacco Use  . Smoking status: Current Some Day Smoker    Packs/day: 0.50    Years: 50.00    Pack years: 25.00    Types: Cigarettes    Last attempt to quit: 02/23/2017    Years since quitting: 2.9  . Smokeless tobacco: Never Used  Vaping Use  . Vaping Use: Never used  Substance Use Topics  . Alcohol use: No  . Drug use: Not Currently    Types: "Crack" cocaine, Heroin, Marijuana, LSD    Comment: Sober since 2018.      Family Hx: The patient's family history includes Alcohol abuse in  his daughter, father, mother, and sister; Asthma in his sister; Depression in his daughter and sister; Diabetes in his mother; Drug abuse in his daughter and sister; Early death in his daughter; Hyperlipidemia in his mother; Hypertension in his daughter, father, mother, and sister.  ROS:   Please see the history of present illness.     All other systems reviewed and are negative.   Prior CV studies:   The following studies were reviewed today:  2D echo 12/2016: - Left ventricle: The cavity size was moderately dilated. Wall  thickness was increased in a pattern of moderate LVH. There was  moderate concentric hypertrophy. Systolic function was severely  reduced. The estimated ejection fraction was in the range of 25%  to 30%. Regional wall motion abnormalities  cannot be excluded.  Doppler parameters are consistent with abnormal left ventricular  relaxation (grade 1 diastolic dysfunction).  - Left atrium: The atrium was mildly dilated.  - Right ventricle: The cavity size was moderately dilated. Wall  thickness was normal.  - Right atrium: The atrium was mildly dilated.   Impressions:   - Moderate to severely depressed LVF  Globally depressed  Moderate to severe LVH concentrically  EF=25-30%  Mod dilation of RV  Mild/Mod depressed RVF. There was no evidence of a vegetation. __________  2D echo 04/2019: 1. Left ventricular ejection fraction, by visual estimation, is 25 to  30%. The left ventricle has moderate to severely decreased function. There  is severely increased left ventricular hypertrophy.  2. The left ventricle demonstrates global hypokinesis.  3. Left ventricular diastolic parameters are consistent with Grade I  diastolic dysfunction (impaired relaxation).  4. Global right ventricle has normal systolic function.The right  ventricular size is normal. No increase in right ventricular wall  thickness.  5. Left atrial size was normal.  6. TR  signal is inadequate for assessing pulmonary artery systolic  pressure.  7. There is mild dilatation of the aortic root, 4.0 cm.  Labs/Other Tests and Data Reviewed:    EKG:  No ECG reviewed.  Recent Labs: 01/07/2020: ALT 6; BUN 31; Creatinine, Ser 1.58; Potassium 4.2; Sodium 136   Recent Lipid Panel Lab Results  Component Value Date/Time   CHOL 183 01/07/2020 10:54 AM   TRIG 117.0 01/07/2020 10:54 AM   HDL 42.30 01/07/2020 10:54 AM   CHOLHDL 4 01/07/2020 10:54 AM   LDLCALC 118 (H) 01/07/2020 10:54 AM   LDLDIRECT 98.0 03/22/2018 02:33 PM    Wt Readings from Last 3 Encounters:  02/07/20 180 lb (81.6 kg)  01/15/20 190 lb (86.2 kg)  01/06/20 188 lb 12.8 oz (85.6 kg)     Risk Assessment/Calculations:      Objective:    Vital Signs:  Ht 6\' 3"  (1.905 m)   Wt 180 lb (81.6 kg)   BMI 22.50 kg/m    VITAL SIGNS:  reviewed  ASSESSMENT & PLAN:    1. HFrEF secondary to NICM: NYHA class I symptoms.  Denies any symptoms concerning for decompensation or volume overload.  He did not tolerate hydralazine as outlined below which has been discontinued.  Titrate carvedilol to 37.5 mg twice daily as outlined below in an effort to optimize his blood pressure regimen while we let hydralazine wash out of his system.  In follow-up consider adding Farxiga to optimize GDMT.  Alternatively, if renal function allows could also add spironolactone however, this has been deferred previously in the setting of underlying CKD.  He will otherwise continue current dose Entresto.  Not requiring standing diuretic.  After his medical therapy has been optimized plan for repeat limited echo in several months time. Should his cardiomyopathy persist with an LVSF less than 35%, consider R/LHC at that time, as well as referral to EP for consideration of ICD for primary prevention.  CHF education.  2. Hypertensive heart disease: Blood pressure remains elevated at home with readings in the 140s over 90s.  He did not  tolerate hydralazine with noted bilateral upper and lower extremity joint stiffness/tightness without noted edema.  In this setting hydralazine has been discontinued.  In an effort to minimize polypharmacy while this is being washed out of his system we have deferred addition of an alternative antihypertensive and instead titrated carvedilol to 37.5 mg twice daily.  He will otherwise  continue current dose Entresto 95/103 mg twice daily.  When he is seen in follow-up could consider de-escalating carvedilol back down to 25 mg twice daily with addition of Farxiga or spironolactone as outlined above.  Low-sodium diet and complete smoking cessation recommended.  3. CKD stage II: Stable on BMP in 12/2019.   4. 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  5. Dilated aortic root: Mildly dilated by echo in 04/2019.  Optimal blood pressure is recommended.  Follow up echo in 03/2020.   6. COPD with ongoing tobacco use: He does not appear to be having an active exacerbation over the phone.  Complete smoking cessation is recommended   Shared Decision Making/Informed Consent        COVID-19 Education: The signs and symptoms of COVID-19 were discussed with the patient and how to seek care for testing (follow up with PCP or arrange E-visit).  The importance of social distancing was discussed today.  Time:   Today, I have spent 11 minutes with the patient with telehealth technology discussing the above problems.     Medication Adjustments/Labs and Tests Ordered: Current medicines are reviewed at length with the patient today.  Concerns regarding medicines are outlined above.   Tests Ordered: No orders of the defined types were placed in this encounter.   Medication Changes: No orders of the defined types were placed in this encounter.   Follow Up:  In Person in 2 month(s)  Signed, [name], PA-C  02/07/2020 11:29 AM    [place]

## 2020-02-07 ENCOUNTER — Telehealth (INDEPENDENT_AMBULATORY_CARE_PROVIDER_SITE_OTHER): Payer: 59 | Admitting: Physician Assistant

## 2020-02-07 ENCOUNTER — Other Ambulatory Visit: Payer: Self-pay

## 2020-02-07 VITALS — Ht 75.0 in | Wt 180.0 lb

## 2020-02-07 DIAGNOSIS — I77810 Thoracic aortic ectasia: Secondary | ICD-10-CM

## 2020-02-07 DIAGNOSIS — N182 Chronic kidney disease, stage 2 (mild): Secondary | ICD-10-CM

## 2020-02-07 DIAGNOSIS — I5022 Chronic systolic (congestive) heart failure: Secondary | ICD-10-CM | POA: Diagnosis not present

## 2020-02-07 DIAGNOSIS — I428 Other cardiomyopathies: Secondary | ICD-10-CM | POA: Diagnosis not present

## 2020-02-07 DIAGNOSIS — I493 Ventricular premature depolarization: Secondary | ICD-10-CM

## 2020-02-07 DIAGNOSIS — I110 Hypertensive heart disease with heart failure: Secondary | ICD-10-CM | POA: Diagnosis not present

## 2020-02-07 DIAGNOSIS — J449 Chronic obstructive pulmonary disease, unspecified: Secondary | ICD-10-CM

## 2020-02-07 DIAGNOSIS — Z720 Tobacco use: Secondary | ICD-10-CM

## 2020-02-07 MED ORDER — CARVEDILOL 25 MG PO TABS: 37.5000 mg | ORAL_TABLET | Freq: Two times a day (BID) | ORAL | 1 refills | Status: DC

## 2020-02-07 NOTE — Patient Instructions (Addendum)
Medication Instructions:   INCREASE Carvedilol (Coreg) to 37.5mg  TWICE Daily - An Rx was sent to your pharmacy: Take Coreg 25mg  1 1/2 tablet TWICE a day  *If you need a refill on your cardiac medications before your next appointment, please call your pharmacy*   Lab Work: None ordered   Testing/Procedures: None ordered   Follow-Up: At [facility], you and your health needs are our priority.  As part of our continuing mission to provide you with exceptional heart care, we have created designated Provider Care Teams.  These Care Teams include your primary Cardiologist (physician) and Advanced Practice Providers (APPs -  Physician Assistants and Nurse Practitioners) who all work together to provide you with the care you need, when you need it.  We recommend signing up for the patient portal called "MyChart".  Sign up information is provided on this After Visit Summary.  MyChart is used to connect with patients for Virtual Visits (Telemedicine).  Patients are able to view lab/test results, encounter notes, upcoming appointments, etc.  Non-urgent messages can be sent to your provider as well.   To learn more about what you can do with MyChart, go to [email or web address].    Your next appointment:   2 month(s) - our office will call you to schedule this appointment  The format for your next appointment:   In Person  Provider:   [name], PA-C

## 2020-02-07 NOTE — Addendum Note (Signed)
Addended by: Darlyne Russian on: 02/07/2020 12:14 PM   Modules accepted: Orders

## 2020-02-14 ENCOUNTER — Other Ambulatory Visit: Payer: Self-pay | Admitting: *Deleted

## 2020-02-14 DIAGNOSIS — I10 Essential (primary) hypertension: Secondary | ICD-10-CM

## 2020-02-17 ENCOUNTER — Other Ambulatory Visit: Payer: Self-pay

## 2020-02-17 ENCOUNTER — Other Ambulatory Visit (INDEPENDENT_AMBULATORY_CARE_PROVIDER_SITE_OTHER): Payer: 59

## 2020-02-17 DIAGNOSIS — I10 Essential (primary) hypertension: Secondary | ICD-10-CM | POA: Diagnosis not present

## 2020-02-17 LAB — COMPREHENSIVE METABOLIC PANEL
ALT: 8 U/L (ref 0–53)
AST: 11 U/L (ref 0–37)
Albumin: 3.9 g/dL (ref 3.5–5.2)
Alkaline Phosphatase: 62 U/L (ref 39–117)
BUN: 25 mg/dL — ABNORMAL HIGH (ref 6–23)
CO2: 30 mEq/L (ref 19–32)
Calcium: 9.1 mg/dL (ref 8.4–10.5)
Chloride: 106 mEq/L (ref 96–112)
Creatinine, Ser: 1.69 mg/dL — ABNORMAL HIGH (ref 0.40–1.50)
GFR: 41.55 mL/min — ABNORMAL LOW (ref >60.00)
Glucose, Bld: 112 mg/dL — ABNORMAL HIGH (ref 70–99)
Potassium: 4 mEq/L (ref 3.5–5.1)
Sodium: 141 mEq/L (ref 135–145)
Total Bilirubin: 0.5 mg/dL (ref 0.2–1.2)
Total Protein: 7.6 g/dL (ref 6.0–8.3)

## 2020-02-26 ENCOUNTER — Other Ambulatory Visit: Payer: Self-pay | Admitting: Family

## 2020-02-26 DIAGNOSIS — N189 Chronic kidney disease, unspecified: Secondary | ICD-10-CM

## 2020-02-27 ENCOUNTER — Telehealth: Payer: Self-pay

## 2020-02-27 ENCOUNTER — Encounter: Payer: Self-pay | Admitting: Emergency Medicine

## 2020-02-27 ENCOUNTER — Emergency Department
Admission: EM | Admit: 2020-02-27 | Discharge: 2020-02-27 | Disposition: A | Discharge status: Home / Self Care | Payer: 59 | Attending: Emergency Medicine | Admitting: Emergency Medicine

## 2020-02-27 ENCOUNTER — Other Ambulatory Visit: Payer: Self-pay

## 2020-02-27 ENCOUNTER — Emergency Department: Payer: 59

## 2020-02-27 DIAGNOSIS — F17210 Nicotine dependence, cigarettes, uncomplicated: Secondary | ICD-10-CM | POA: Insufficient documentation

## 2020-02-27 DIAGNOSIS — I2510 Atherosclerotic heart disease of native coronary artery without angina pectoris: Secondary | ICD-10-CM | POA: Diagnosis not present

## 2020-02-27 DIAGNOSIS — Z79899 Other long term (current) drug therapy: Secondary | ICD-10-CM | POA: Diagnosis not present

## 2020-02-27 DIAGNOSIS — Z7982 Long term (current) use of aspirin: Secondary | ICD-10-CM | POA: Diagnosis not present

## 2020-02-27 DIAGNOSIS — I110 Hypertensive heart disease with heart failure: Secondary | ICD-10-CM | POA: Diagnosis not present

## 2020-02-27 DIAGNOSIS — U071 COVID-19: Secondary | ICD-10-CM | POA: Insufficient documentation

## 2020-02-27 DIAGNOSIS — I509 Heart failure, unspecified: Secondary | ICD-10-CM | POA: Insufficient documentation

## 2020-02-27 DIAGNOSIS — R059 Cough, unspecified: Secondary | ICD-10-CM | POA: Diagnosis present

## 2020-02-27 LAB — URINALYSIS, COMPLETE (UACMP) WITH MICROSCOPIC
Bilirubin Urine: NEGATIVE
Glucose, UA: NEGATIVE mg/dL
Ketones, ur: NEGATIVE mg/dL
Leukocytes,Ua: NEGATIVE
Nitrite: NEGATIVE
Protein, ur: >=300 mg/dL — AB
Specific Gravity, Urine: 1.022 (ref 1.005–1.030)
pH: 5 (ref 5.0–8.0)

## 2020-02-27 LAB — CBC WITH DIFFERENTIAL/PLATELET
Abs Immature Granulocytes: 0.02 10*3/uL (ref 0.00–0.07)
Basophils Absolute: 0 10*3/uL (ref 0.0–0.1)
Basophils Relative: 1 %
Eosinophils Absolute: 0 10*3/uL (ref 0.0–0.5)
Eosinophils Relative: 1 %
HCT: 44.4 % (ref 39.0–52.0)
Hemoglobin: 15 g/dL (ref 13.0–17.0)
Immature Granulocytes: 1 %
Lymphocytes Relative: 14 %
Lymphs Abs: 0.6 10*3/uL — ABNORMAL LOW (ref 0.7–4.0)
MCH: 30.4 pg (ref 26.0–34.0)
MCHC: 33.8 g/dL (ref 30.0–36.0)
MCV: 90.1 fL (ref 80.0–100.0)
Monocytes Absolute: 0.8 10*3/uL (ref 0.1–1.0)
Monocytes Relative: 19 %
Neutro Abs: 2.6 10*3/uL (ref 1.7–7.7)
Neutrophils Relative %: 64 %
Platelets: 97 10*3/uL — ABNORMAL LOW (ref 150–400)
RBC: 4.93 MIL/uL (ref 4.22–5.81)
RDW: 13.9 % (ref 11.5–15.5)
Smear Review: DECREASED
WBC: 4 10*3/uL (ref 4.0–10.5)
nRBC: 0 % (ref 0.0–0.2)

## 2020-02-27 LAB — COMPREHENSIVE METABOLIC PANEL
ALT: 10 U/L (ref 0–44)
AST: 20 U/L (ref 15–41)
Albumin: 3.9 g/dL (ref 3.5–5.0)
Alkaline Phosphatase: 55 U/L (ref 38–126)
Anion gap: 9 (ref 5–15)
BUN: 20 mg/dL (ref 8–23)
CO2: 27 mmol/L (ref 22–32)
Calcium: 9 mg/dL (ref 8.9–10.3)
Chloride: 103 mmol/L (ref 98–111)
Creatinine, Ser: 1.83 mg/dL — ABNORMAL HIGH (ref 0.61–1.24)
GFR, Estimated: 40 mL/min — ABNORMAL LOW (ref >60)
Glucose, Bld: 90 mg/dL (ref 70–99)
Potassium: 4.4 mmol/L (ref 3.5–5.1)
Sodium: 139 mmol/L (ref 135–145)
Total Bilirubin: 1 mg/dL (ref 0.3–1.2)
Total Protein: 8.2 g/dL — ABNORMAL HIGH (ref 6.5–8.1)

## 2020-02-27 LAB — TROPONIN I (HIGH SENSITIVITY)
Troponin I (High Sensitivity): 19 ng/L — ABNORMAL HIGH (ref <18)
Troponin I (High Sensitivity): 18 ng/L — ABNORMAL HIGH (ref <18)

## 2020-02-27 LAB — RESP PANEL BY RT-PCR (FLU A&B, COVID) ARPGX2
Influenza A by PCR: NEGATIVE
Influenza B by PCR: NEGATIVE
SARS Coronavirus 2 by RT PCR: POSITIVE — AB

## 2020-02-27 MED ORDER — ACETAMINOPHEN 500 MG PO TABS
1000.0000 mg | ORAL_TABLET | Freq: Once | ORAL | Status: AC
  Administered 2020-02-27: 1000 mg via ORAL

## 2020-02-27 MED ORDER — ALBUTEROL SULFATE HFA 108 (90 BASE) MCG/ACT IN AERS
2.0000 | INHALATION_SPRAY | Freq: Once | RESPIRATORY_TRACT | Status: AC
  Administered 2020-02-27: 2 via RESPIRATORY_TRACT
  Filled 2020-02-27: qty 6.7

## 2020-02-27 MED ORDER — ACETAMINOPHEN 500 MG PO TABS
ORAL_TABLET | ORAL | Status: AC
  Filled 2020-02-27: qty 2

## 2020-02-27 MED ORDER — ONDANSETRON 4 MG PO TBDP: 4.0000 mg | ORAL_TABLET | Freq: Three times a day (TID) | ORAL | 0 refills | Status: AC | PRN

## 2020-02-27 MED ORDER — BENZONATATE 100 MG PO CAPS: 100.0000 mg | ORAL_CAPSULE | Freq: Three times a day (TID) | ORAL | 0 refills | Status: AC | PRN

## 2020-02-27 MED ORDER — SODIUM CHLORIDE 0.9 % IV BOLUS
500.0000 mL | Freq: Once | INTRAVENOUS | Status: AC
  Administered 2020-02-27: 500 mL via INTRAVENOUS

## 2020-02-27 NOTE — ED Triage Notes (Signed)
Pt to ED via [facility] with c/o HA/N/V wheezing, per EMS symptoms started last night, fever last night of 100.8.  Per EMS pt with hx HTN and CHF.  190/110 97% 80HR

## 2020-02-27 NOTE — Discharge Instructions (Addendum)
You can take 2 puffs of your albuterol inhaler every 4 hours as needed for wheezing. Take Tylenol and ibuprofen alternating for fever. He can take Zofran for nausea.

## 2020-02-27 NOTE — Telephone Encounter (Signed)
Pt's wife Jonathon Rios called and states that pt may need to go to ED. He has chills, cough, fever, congested. Wife is worried since pt has previous heart related issues. Pt is very weak- per wife. She states that she can not drive him to the ED or urgent care due to her having parkinson's. Transferred to Pennie Rushing at [facility] .

## 2020-02-27 NOTE — Telephone Encounter (Signed)
FYI patient in ED 

## 2020-02-27 NOTE — ED Notes (Signed)
Pt not well appearing. Diaphoretic and SOB. MD given EKG and made aware of pt condition

## 2020-02-27 NOTE — ED Notes (Signed)
PA notified of Cov+  Pt provided with blanket, crackers, remote per request. Informed of test result

## 2020-02-27 NOTE — ED Provider Notes (Signed)
[facility]-EMERGENCY DEPARTMENT  ____________________________________________  Time seen: Approximately 3:20 PM  I have reviewed the triage vital signs and the nursing notes.   HISTORY  Chief Complaint No chief complaint on file.   Historian     HPI Jonathon Rios is a 69 y.o. male with a history of hypertension, coronary artery disease, CHF and hyperlipidemia, presents to the emergency department with flulike symptoms that started last night.  Patient has headache, wheezing, cough, emesis and diarrhea at home.  He is unsure of whether he has had sick contacts in his home but states he has had family over for Christmas.  He denies current chest pain or chest tightness.  No other alleviating measures have been attempted.   Past Medical History:  Diagnosis Date  . Alcoholism and drug addiction in family   . Arthritis   . CHF (congestive heart failure) (HCC)   . Coronary artery disease   . Heart disease   . Hypertension   . Irregular heart beat   . Positive TB test   . Seizure [facility])    witnessed by family in restaurant     Immunizations up to date:  Yes.     Past Medical History:  Diagnosis Date  . Alcoholism and drug addiction in family   . Arthritis   . CHF (congestive heart failure) (HCC)   . Coronary artery disease   . Heart disease   . Hypertension   . Irregular heart beat   . Positive TB test   . Seizure [facility])    witnessed by family in restaurant    Patient Active Problem List   Diagnosis Date Noted  . Tinea corporis 05/15/2019  . Chronic hepatitis C (HCC) 11/07/2018  . History of drug abuse (HCC) 10/17/2017  . HTN (hypertension) 03/16/2017  . Tobacco use 03/16/2017  . Moderate recurrent major depression (HCC) 03/09/2017  . PTSD (post-traumatic stress disorder) 03/09/2017  . Cocaine abuse (HCC) 03/09/2017  . History of cocaine abuse (HCC) 03/09/2017  . CHF (congestive heart failure) (HCC) 03/07/2017  . Acute CHF (congestive heart failure) (HCC)  01/02/2017    Past Surgical History:  Procedure Laterality Date  . CARDIAC CATHETERIZATION    . TONSILLECTOMY      Prior to Admission medications   Medication Sig Start Date End Date Taking? Authorizing Provider  benzonatate (TESSALON PERLES) 100 MG capsule Take 1 capsule (100 mg total) by mouth 3 (three) times daily as needed for up to 7 days for cough. 02/27/20 03/05/20 Yes Pia Mau M, PA-C  ondansetron (ZOFRAN ODT) 4 MG disintegrating tablet Take 1 tablet (4 mg total) by mouth every 8 (eight) hours as needed for up to 5 days. 02/27/20 03/03/20 Yes Pia Mau M, PA-C  albuterol (PROAIR HFA) 108 (90 Base) MCG/ACT inhaler Inhale 2 puffs into the lungs every 6 (six) hours as needed for wheezing or shortness of breath. 11/07/19   Sondra Barges, PA-C  aspirin EC 81 MG tablet Take 1 tablet (81 mg total) by mouth daily. 07/26/18   Iran Ouch, MD  budesonide-formoterol (SYMBICORT) 160-4.5 MCG/ACT inhaler TAKE 2 PUFFS BY MOUTH TWICE A DAY 11/07/19   Sondra Barges, PA-C  carvedilol (COREG) 25 MG tablet Take 1.5 tablets (37.5 mg total) by mouth 2 (two) times daily with a meal. 02/07/20 08/05/20  Dunn, Raymon Mutton, PA-C  clotrimazole (LOTRIMIN) 1 % cream APPLY TO AFFECTED AREA TWICE A DAY 08/05/19   Allegra Grana, FNP  hydrALAZINE (APRESOLINE) 50 MG tablet Take  1 tablet (50 mg total) by mouth 2 (two) times daily. 01/15/20 04/14/20  [name], PA-C  rosuvastatin (CRESTOR) 10 MG tablet Take 1 tablet (10 mg total) by mouth daily. 01/08/20   [name], FNP  sacubitril-valsartan (ENTRESTO) 97-103 MG Take 1 tablet by mouth 2 (two) times daily. 11/07/19   [name], PA-C    Allergies Penicillins  Family History  Problem Relation Age of Onset  . Hypertension Mother   . Diabetes Mother   . Alcohol abuse Mother   . Hyperlipidemia Mother   . Hypertension Sister   . Alcohol abuse Sister   . Asthma Sister   . Depression Sister   . Drug abuse Sister   . Alcohol abuse Father   . Hypertension  Father   . Hypertension Daughter   . Early death Daughter   . Drug abuse Daughter   . Depression Daughter   . Alcohol abuse Daughter     Social History Social History   Tobacco Use  . Smoking status: Current Some Day Smoker    Packs/day: 0.50    Years: 50.00    Pack years: 25.00    Types: Cigarettes    Last attempt to quit: 02/23/2017    Years since quitting: 3.0  . Smokeless tobacco: Never Used  Vaping Use  . Vaping Use: Never used  Substance Use Topics  . Alcohol use: No  . Drug use: Not Currently    Types: "Crack" cocaine, Heroin, Marijuana, LSD    Comment: Sober since 2018.       Review of Systems  Constitutional: Patient has fever.  Eyes: No visual changes. No discharge ENT: Patient has congestion.  Cardiovascular: no chest pain. Respiratory: Patient has cough.  Gastrointestinal: No abdominal pain.  No nausea, no vomiting. Patient had diarrhea.  Genitourinary: Negative for dysuria. No hematuria Musculoskeletal: Patient has myalgias.  Skin: Negative for rash, abrasions, lacerations, ecchymosis. Neurological: Patient has headache, no focal weakness or numbness.     ____________________________________________   PHYSICAL EXAM:  VITAL SIGNS: ED Triage Vitals  Enc Vitals Group     BP 02/27/20 1211 (!) 167/110     Pulse Rate 02/27/20 1211 80     Resp 02/27/20 1211 20     Temp 02/27/20 1211 (!) 102.7 F (39.3 C)     Temp Source 02/27/20 1211 Oral     SpO2 02/27/20 1211 96 %     Weight 02/27/20 1212 189 lb (85.7 kg)     Height 02/27/20 1212 6\' 3"  (1.905 m)     Head Circumference --      Peak Flow --      Pain Score 02/27/20 1212 7     Pain Loc --      Pain Edu? --      Excl. in [place]? --      Constitutional: Alert and oriented. Patient is lying supine. Eyes: Conjunctivae are normal. PERRL. EOMI. Head: Atraumatic. ENT:      Ears: Tympanic membranes are mildly injected with mild effusion bilaterally.       Nose: No congestion/rhinnorhea.       Mouth/Throat: Mucous membranes are moist. Posterior pharynx is mildly erythematous.  Hematological/Lymphatic/Immunilogical: No cervical lymphadenopathy.  Cardiovascular: Normal rate, regular rhythm. Normal S1 and S2.  Good peripheral circulation. Respiratory: Normal respiratory effort without tachypnea or retractions. Lungs CTAB. Good air entry to the bases with no decreased or absent breath sounds. Gastrointestinal: Bowel sounds 4 quadrants. Soft and nontender to palpation. No  guarding or rigidity. No palpable masses. No distention. No CVA tenderness. Musculoskeletal: Full range of motion to all extremities. No gross deformities appreciated. Neurologic:  Normal speech and language. No gross focal neurologic deficits are appreciated.  Skin:  Skin is warm, dry and intact. No rash noted. Psychiatric: Mood and affect are normal. Speech and behavior are normal. Patient exhibits appropriate insight and judgement.    ____________________________________________   LABS (all labs ordered are listed, but only abnormal results are displayed)  Labs Reviewed  RESP PANEL BY RT-PCR (FLU A&B, COVID) ARPGX2 - Abnormal; Notable for the following components:      Result Value   SARS Coronavirus 2 by RT PCR POSITIVE (*)    All other components within normal limits  CBC WITH DIFFERENTIAL/PLATELET - Abnormal; Notable for the following components:   Platelets 97 (*)    Lymphs Abs 0.6 (*)    All other components within normal limits  COMPREHENSIVE METABOLIC PANEL - Abnormal; Notable for the following components:   Creatinine, Ser 1.83 (*)    Total Protein 8.2 (*)    GFR, Estimated 40 (*)    All other components within normal limits  URINALYSIS, COMPLETE (UACMP) WITH MICROSCOPIC - Abnormal; Notable for the following components:   Color, Urine YELLOW (*)    APPearance HAZY (*)    Hgb urine dipstick LARGE (*)    Protein, ur >=300 (*)    Bacteria, UA FEW (*)    All other components within normal limits   TROPONIN I (HIGH SENSITIVITY) - Abnormal; Notable for the following components:   Troponin I (High Sensitivity) 19 (*)    All other components within normal limits  TROPONIN I (HIGH SENSITIVITY) - Abnormal; Notable for the following components:   Troponin I (High Sensitivity) 18 (*)    All other components within normal limits   ____________________________________________  EKG   ____________________________________________  RADIOLOGY   DG Chest 1 View  Result Date: 02/27/2020 CLINICAL DATA:  cough EXAM: CHEST  1 VIEW COMPARISON:  03/22/2018 and prior. FINDINGS: No pneumothorax or pleural effusion. Mild peribronchial cuffing. Stable cardiomegaly. Hazy bibasilar opacities. No acute osseous abnormality. IMPRESSION: Hazy bibasilar opacities, atelectasis versus infiltrate. Mild bronchitic changes. Stable cardiomegaly. Electronically Signed   By: Stana Bunting M.D.   On: 02/27/2020 15:39    ____________________________________________    PROCEDURES  Procedure(s) performed:     Procedures     Medications  acetaminophen (TYLENOL) tablet 1,000 mg (1,000 mg Oral Given 02/27/20 1214)  albuterol (VENTOLIN HFA) 108 (90 Base) MCG/ACT inhaler 2 puff (2 puffs Inhalation Given 02/27/20 1611)  sodium chloride 0.9 % bolus 500 mL (0 mLs Intravenous Stopped 02/27/20 1751)     ____________________________________________   INITIAL IMPRESSION / ASSESSMENT AND PLAN / ED COURSE  Pertinent labs & imaging results that were available during my care of the patient were reviewed by me and considered in my medical decision making (see chart for details).      Assessment and Plan:  Fever Cough 69 year old male presents to the emergency department with viral URI-like symptoms that started acutely last night.  Patient was hypertensive and febrile at triage and mildly tachypneic.  On exam, patient had diffuse expiratory wheezing and was using abdominal muscles for  respiration.  Differential diagnosis includes COVID-19, influenza, acquired pneumonia, sepsis....  Patient tested positive for COVID-19.  Patient's initial troponin was elevated at 19.  Repeat troponin trended down to 18.  EKG was reviewed and there was no ST segment elevation.  EKG  is consistent with prior EKG obtained on 01/15/2020.  Patient received a 500 cc bolus of normal saline and was able to consume applesauce and several snacks.  Patient was discharged home with an albuterol inhaler, Tessalon Perles and Zofran.  Return precautions were given to return with new or worsening symptoms.  All patient questions were answered.   ____________________________________________  FINAL CLINICAL IMPRESSION(S) / ED DIAGNOSES  Final diagnoses:  COVID-19      NEW MEDICATIONS STARTED DURING THIS VISIT:  ED Discharge Orders         Ordered    benzonatate (TESSALON PERLES) 100 MG capsule  3 times daily PRN        02/27/20 1833    ondansetron (ZOFRAN ODT) 4 MG disintegrating tablet  Every 8 hours PRN        02/27/20 1833              This chart was dictated using voice recognition software/Dragon. Despite best efforts to proofread, errors can occur which can change the meaning. Any change was purely unintentional.     Lannie Fields, PA-C 02/27/20 1846    Nance Pear, MD 02/27/20 [phone]

## 2020-02-27 NOTE — ED Triage Notes (Addendum)
Pt arrived via EMS. Pt states he has flu like symptoms that started last night. Pt states 100.8 F temp last night.   Pt in NAD at this time. A&Ox4.

## 2020-03-03 ENCOUNTER — Ambulatory Visit: Payer: 59

## 2020-04-07 ENCOUNTER — Other Ambulatory Visit: Payer: Self-pay

## 2020-04-10 ENCOUNTER — Other Ambulatory Visit: Payer: Self-pay

## 2020-04-10 ENCOUNTER — Ambulatory Visit (INDEPENDENT_AMBULATORY_CARE_PROVIDER_SITE_OTHER): Payer: Medicare Other | Admitting: Family

## 2020-04-10 ENCOUNTER — Encounter: Payer: Self-pay | Admitting: Family

## 2020-04-10 DIAGNOSIS — N189 Chronic kidney disease, unspecified: Secondary | ICD-10-CM | POA: Diagnosis not present

## 2020-04-10 DIAGNOSIS — E785 Hyperlipidemia, unspecified: Secondary | ICD-10-CM | POA: Diagnosis not present

## 2020-04-10 DIAGNOSIS — I10 Essential (primary) hypertension: Secondary | ICD-10-CM | POA: Diagnosis not present

## 2020-04-10 DIAGNOSIS — Z720 Tobacco use: Secondary | ICD-10-CM | POA: Diagnosis not present

## 2020-04-10 DIAGNOSIS — N1832 Chronic kidney disease, stage 3b: Secondary | ICD-10-CM | POA: Insufficient documentation

## 2020-04-10 NOTE — Assessment & Plan Note (Signed)
Discussed at length with patient and reiterated importance of making a follow up with Dr Juleen China. Phone number has been provided.

## 2020-04-10 NOTE — Assessment & Plan Note (Signed)
Anticipate improved. Continue crestor 10mg 

## 2020-04-10 NOTE — Patient Instructions (Addendum)
Call to schedule your CT Chest for lung cancer screening. [place] [phone]  For kidney specialist, you had an appointment with Dr Lavonia [phone]  Nice to see you!

## 2020-04-10 NOTE — Assessment & Plan Note (Signed)
Provided number for him to schedule CT lung cancer screen

## 2020-04-10 NOTE — Progress Notes (Signed)
Subjective:    Patient ID: Jonathon Rios, male    DOB: Jun 13, 1950, 70 y.o.   MRN: [id number]  CC: Jonathon Rios is a 70 y.o. male who presents today for follow up.   HPI: Feels well today. No complaints.   He had covid 2 months ago. Cough has resolved.   Seen by cardiology 2 months ago ryan dunn for HFrEF titrated carvidolol to 37.5mg  BID,  Entresto 95/103 mg twice daily. He is no longer on hydralazine.   Smoking  CKD- had to reschedule appointment with Dr Juleen China due to covid. He needs phone number to reschedule.   HLD- compliant with crestor.   Breathing well. He uses symbicort and albuterol PRN     HISTORY:  Past Medical History:  Diagnosis Date  . Alcoholism and drug addiction in family   . Arthritis   . CHF (congestive heart failure) ([place])   . Coronary artery disease   . Heart disease   . Hypertension   . Irregular heart beat   . Positive TB test   . Seizure [facility])    witnessed by family in restaurant   Past Surgical History:  Procedure Laterality Date  . CARDIAC CATHETERIZATION    . TONSILLECTOMY     Family History  Problem Relation Age of Onset  . Hypertension Mother   . Diabetes Mother   . Alcohol abuse Mother   . Hyperlipidemia Mother   . Hypertension Sister   . Alcohol abuse Sister   . Asthma Sister   . Depression Sister   . Drug abuse Sister   . Alcohol abuse Father   . Hypertension Father   . Hypertension Daughter   . Early death Daughter   . Drug abuse Daughter   . Depression Daughter   . Alcohol abuse Daughter     Allergies: Penicillins Current Outpatient Medications on File Prior to Visit  Medication Sig Dispense Refill  . albuterol (PROAIR HFA) 108 (90 Base) MCG/ACT inhaler Inhale 2 puffs into the lungs every 6 (six) hours as needed for wheezing or shortness of breath. 1 each 0  . aspirin EC 81 MG tablet Take 1 tablet (81 mg total) by mouth daily.    . budesonide-formoterol (SYMBICORT) 160-4.5 MCG/ACT inhaler TAKE 2 PUFFS BY  MOUTH TWICE A DAY 1 each 0  . carvedilol (COREG) 25 MG tablet Take 1.5 tablets (37.5 mg total) by mouth 2 (two) times daily with a meal. 270 tablet 1  . clotrimazole (LOTRIMIN) 1 % cream APPLY TO AFFECTED AREA TWICE A DAY 30 g 2  . rosuvastatin (CRESTOR) 10 MG tablet Take 1 tablet (10 mg total) by mouth daily. 90 tablet 3  . sacubitril-valsartan (ENTRESTO) 97-103 MG Take 1 tablet by mouth 2 (two) times daily. 60 tablet 6   No current facility-administered medications on file prior to visit.    Social History   Tobacco Use  . Smoking status: Current Some Day Smoker    Packs/day: 0.50    Years: 50.00    Pack years: 25.00    Types: Cigarettes    Last attempt to quit: 02/23/2017    Years since quitting: 3.1  . Smokeless tobacco: Never Used  Vaping Use  . Vaping Use: Never used  Substance Use Topics  . Alcohol use: No  . Drug use: Not Currently    Types: "Crack" cocaine, Heroin, Marijuana, LSD    Comment: Sober since 2018.     Review of Systems  Constitutional: Negative for chills and  fever.  Respiratory: Negative for cough and shortness of breath.   Cardiovascular: Negative for chest pain, palpitations and leg swelling.  Gastrointestinal: Negative for nausea and vomiting.      Objective:    BP 136/80   Pulse 72   Temp 98.3 F (36.8 C) (Oral)   Ht 6\' 3"  (1.905 m)   Wt 190 lb 4.8 oz (86.3 kg)   SpO2 99%   BMI 23.79 kg/m  BP Readings from Last 3 Encounters:  04/10/20 136/80  02/27/20 (!) 179/110  01/15/20 (!) 144/104   Wt Readings from Last 3 Encounters:  04/10/20 190 lb 4.8 oz (86.3 kg)  02/27/20 189 lb (85.7 kg)  02/07/20 180 lb (81.6 kg)    Physical Exam Vitals reviewed.  Constitutional:      Appearance: He is well-developed and well-nourished.  Cardiovascular:     Rate and Rhythm: Regular rhythm.     Heart sounds: Normal heart sounds.  Pulmonary:     Effort: Pulmonary effort is normal. No respiratory distress.     Breath sounds: Normal breath sounds. No  wheezing, rhonchi or rales.  Skin:    General: Skin is warm and dry.  Neurological:     Mental Status: He is alert.  Psychiatric:        Mood and Affect: Mood and affect normal.        Speech: Speech normal.        Behavior: Behavior normal.        Assessment & Plan:   Problem List Items Addressed This Visit      Cardiovascular and Mediastinum   HTN (hypertension) (Chronic)    Controlled. Continue carvidolol to 37.5mg  BID,  Entresto 95/103 mg twice daily.         Genitourinary   CKD (chronic kidney disease)    Discussed at length with patient and reiterated importance of making a follow up with Dr Juleen China. Phone number has been provided.         Other   HLD (hyperlipidemia)    Anticipate improved. Continue crestor 10mg       Relevant Orders   Lipid panel   Tobacco use    Provided number for him to schedule CT lung cancer screen          I have discontinued Gershon A. Barsamian's hydrALAZINE. I am also having him maintain his aspirin EC, clotrimazole, Entresto, albuterol, budesonide-formoterol, rosuvastatin, and carvedilol.   No orders of the defined types were placed in this encounter.   Return precautions given.   Risks, benefits, and alternatives of the medications and treatment plan prescribed today were discussed, and patient expressed understanding.   Education regarding symptom management and diagnosis given to patient on AVS.  Continue to follow with [name], FNP for routine health maintenance.   [name] and I agreed with plan.   Mable Paris, FNP

## 2020-04-10 NOTE — Assessment & Plan Note (Signed)
Controlled. Continue carvidolol to 37.5mg  BID,  Entresto 95/103 mg twice daily.

## 2020-04-11 NOTE — Progress Notes (Deleted)
Cardiology Office Note    Date:  04/11/2020   ID:  Christopherjohn, Schiele 1950/04/09, MRN [id number]  PCP:  [name], FNP  Cardiologist:  [name], MD  Electrophysiologist:  None   Chief Complaint: Follow up  History of Present Illness:   Jonathon Rios is a 70 y.o. male with history of HFrEF secondary to NICM, hypertensive heart disease, CKD stage II, hepatitis C s/p therapy, priorpolysubstance use includingIVDUin the setting of significant trauma, excessive alcohol use, prior incarceration,mildly dilated aortic root,and tobacco use who presents for virtual follow up of his cardiomyopathy and hypertensive heart disease.  He has a history of heroin use in the 1970s, and more recently cocaine use. He has attended drug rehab, and has been drug free for over4years. He was diagnosed with HFrEF years ago while living in [place]. He had a cardiac cath done at that time and was told there was no significant CAD. Echo in 12/2016 showed an EF of 25-30%. Repeat echo in 04/2019 showed an EF of 25-30%, global hypokinesis, Gr1DD, normal RVSF and RV cavity size, and a mildly dilated aortic root measuring 4 cm. When he was seen in 04/2019, his BP was poorly controlled at 160/100 and his lisinopril was changed to [facility]. He was seen by Marrianne Mood, PA-C in 05/2019 with a BP of 138/100, initially 180/110 and a stable weight. He indicated his elevated BP was in the setting of home stressors. He continued to smoke. Coreg was increased to 12.5 mg bid.He was seen in the office in 10/2019 and was doing very well from a cardiac perspective. BP was elevated at 166/102 though he reported well-controlled BPs at home. Entresto was titrated to 95/103 mg twice daily. He was seen by his PCP on 01/06/2020 with BP 144/100 with carvedilol being titrated to 25 mg twice daily.  He was seen in 12/2019, for blood pressure follow-up, at which time he was doing well from a cardiac perspective.  His BP  remained elevated at 144/104 with recheck at the end of his visit noted to be 141/92.  Hydralazine 50 mg twice daily was added to his regimen of carvedilol and Entresto.  He was seen virtually on 02/07/2020 at which time he was doing well from a cardiac perspective.  He noted since starting hydralazine he had bilateral upper and lower extremity stiffness/tightness.  His weight was stable.  There were no symptoms concerning for volume overload.  His BP was running in the 140s over 90s.  Hydralazine was discontinued.  In an effort to minimize polypharmacy his carvedilol was titrated to 37.5 mg twice daily with plans to potentially de-escalate this when he is seen in follow-up.  Since he was seen virtually he was diagnosed with COVID-19 in late 01/2020, he did not require hospital admission.  High-sensitivity troponin was cycled with an initial value of 19 and a delta of 18.  He was seen by his PCP on 04/10/2020 with a BP of 136/80 and was overall doing well.  ***   Labs independently reviewed: 01/2020 - potassium 4.4, BUN 20, serum creatinine 1.83, albumin 3.9, AST/ALT normal, Hgb 15.0, PLT 97 12/2019-TC 183, TG 170, HDL 42, LDL 118, A1c 5.7 02/2018 - TSH normal   Past Medical History:  Diagnosis Date  . Alcoholism and drug addiction in family   . Arthritis   . CHF (congestive heart failure) ([place])   . Coronary artery disease   . Heart disease   . Hypertension   .  Irregular heart beat   . Positive TB test   . Seizure [facility])    witnessed by family in restaurant    Past Surgical History:  Procedure Laterality Date  . CARDIAC CATHETERIZATION    . TONSILLECTOMY      Current Medications: No outpatient medications have been marked as taking for the 04/15/20 encounter (Appointment) with [name], PA-C.    Allergies:   Penicillins   Social History   Socioeconomic History  . Marital status: Legally Separated    Spouse name: Not on file  . Number of children: 4  . Years of  education: 31  . Highest education level: High school graduate  Occupational History  . Not on file  Tobacco Use  . Smoking status: Current Some Day Smoker    Packs/day: 0.50    Years: 50.00    Pack years: 25.00    Types: Cigarettes    Last attempt to quit: 02/23/2017    Years since quitting: 3.1  . Smokeless tobacco: Never Used  Vaping Use  . Vaping Use: Never used  Substance and Sexual Activity  . Alcohol use: No  . Drug use: Not Currently    Types: "Crack" cocaine, Heroin, Marijuana, LSD    Comment: Sober since 2018.   [name] Sexual activity: Yes    Birth control/protection: Condom  Other Topics Concern  . 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    Raped at 70 years old   Social Determinants of Radio broadcast assistant Strain: Not on file  Food Insecurity: Not on file  Transportation Needs: Not on file  Physical Activity: Not on file  Stress: Not on file  Social Connections: Not on file     Family History:  The patient's family history includes Alcohol abuse in his daughter, father, mother, and sister; Asthma in his sister; Depression in his daughter and sister; Diabetes in his mother; Drug abuse in his daughter and sister; Early death in his daughter; Hyperlipidemia in his mother; Hypertension in his daughter, father, mother, and sister.  ROS:   ROS   EKGs/Labs/Other Studies Reviewed:    Studies reviewed were summarized above. The additional studies were reviewed today:  2D echo 12/2016: - Left ventricle: The cavity size was moderately dilated. Wall  thickness was increased in a pattern of moderate LVH. There was  moderate concentric hypertrophy. Systolic function was severely  reduced.  The estimated ejection fraction was in the range of 25%  to 30%. Regional wall motion abnormalities cannot be excluded.  Doppler parameters are consistent with abnormal left ventricular  relaxation (grade 1 diastolic dysfunction).  - Left atrium: The atrium was mildly dilated.  - Right ventricle: The cavity size was moderately dilated. Wall  thickness was normal.  - Right atrium: The atrium was mildly dilated.   Impressions:   - Moderate to severely depressed LVF  Globally depressed  Moderate to severe LVH concentrically  EF=25-30%  Mod dilation of RV  Mild/Mod depressed RVF. There was no evidence of a vegetation. __________  2D echo 04/2019: 1. Left ventricular ejection fraction, by visual estimation, is 25 to  30%. The left ventricle has  moderate to severely decreased function. There  is severely increased left ventricular hypertrophy.  2. The left ventricle demonstrates global hypokinesis.  3. Left ventricular diastolic parameters are consistent with Grade I  diastolic dysfunction (impaired relaxation).  4. Global right ventricle has normal systolic function.The right  ventricular size is normal. No increase in right ventricular wall  thickness.  5. Left atrial size was normal.  6. TR signal is inadequate for assessing pulmonary artery systolic  pressure.  7. There is mild dilatation of the aortic root, 4.0 cm.   EKG:  EKG is ordered today.  The EKG ordered today demonstrates ***  Recent Labs: 02/27/2020: ALT 10; BUN 20; Creatinine, Ser 1.83; Hemoglobin 15.0; Platelets 97; Potassium 4.4; Sodium 139  Recent Lipid Panel    Component Value Date/Time   CHOL 183 01/07/2020 1054   TRIG 117.0 01/07/2020 1054   HDL 42.30 01/07/2020 1054   CHOLHDL 4 01/07/2020 1054   VLDL 23.4 01/07/2020 1054   LDLCALC 118 (H) 01/07/2020 1054   LDLDIRECT 98.0 03/22/2018 1433    PHYSICAL EXAM:    VS:  There were no vitals taken for this visit.  BMI: There is no  height or weight on file to calculate BMI.  Physical Exam  Wt Readings from Last 3 Encounters:  04/10/20 190 lb 4.8 oz (86.3 kg)  02/27/20 189 lb (85.7 kg)  02/07/20 180 lb (81.6 kg)     ASSESSMENT & PLAN:   1. HFrEF secondary to NICM:  2. Hypertensive heart disease: Blood pressure ***  3. CKD stage II:  4. PVCs:  5. Dilated aortic root:  6. COPD with ongoing tobacco use:  Disposition: F/u with Dr. Fletcher Anon or an APP in ***.   Medication Adjustments/Labs and Tests Ordered: Current medicines are reviewed at length with the patient today.  Concerns regarding medicines are outlined above. Medication changes, Labs and Tests ordered today are summarized above and listed in the Patient Instructions accessible in Encounters.   Signed, [name], PA-C 04/11/2020 10:14 AM     [address] [place], [place] [address] [phone]

## 2020-04-15 ENCOUNTER — Ambulatory Visit: Payer: Medicare Other | Admitting: Physician Assistant

## 2020-04-17 NOTE — Progress Notes (Signed)
Cardiology Office Note    Date:  04/24/2020   ID:  Jonathon Rios, Jonathon Rios [birth year], MRN [id number]  PCP:  [name], FNP  Cardiologist:  [name], MD  Electrophysiologist:  None   Chief Complaint: Follow up  History of Present Illness:   Jonathon Rios is a 70 y.o. male with history of HFrEF secondary to NICM, hypertensive heart disease, CKD stage II, hepatitis C s/p therapy, priorpolysubstance use includingIVDUin the setting of significant trauma, excessive alcohol use, prior incarceration,Covid infection in 01/2020, mildly dilated aortic root,and tobacco use who presents for virtual follow up of his cardiomyopathy and hypertensive heart disease.  He has a history of heroin use in the 1970s, and more recently cocaine use. He has attended drug rehab, and has been drug free for over4years. He was diagnosed with HFrEF years ago while living in [place]. He had a cardiac cath done at that time and was told there was no significant CAD. Echo in 12/2016 showed an EF of 25-30%. Repeat echo in 04/2019 showed an EF of 25-30%, global hypokinesis, Gr1DD, normal RVSF and RV cavity size, and a mildly dilated aortic root measuring 4 cm. When he was seen in 04/2019, his BP was poorly controlled at 160/100 and his lisinopril was changed to [facility]. He was seen by Marrianne Mood, PA-C in 05/2019 with a BP of 138/100, initially 180/110 and a stable weight. He indicated his elevated BP was in the setting of home stressors. He continued to smoke. Coreg was increased to 12.5 mg bid.He was seen in the office in 10/2019 and was doing very well from a cardiac perspective. BP was elevated at 166/102 though he reported well-controlled BPs at home. Entresto was titrated to 95/103 mg twice daily. He was seen by his PCP on 01/06/2020 with BP 144/100 with carvedilol being titrated to 25 mg twice daily.  He was seen in 12/2019, for blood pressure follow-up, at which time he was doing well from a  cardiac perspective.  His BP remained elevated at 144/104 with recheck at the end of his visit noted to be 141/92.  Hydralazine 50 mg twice daily was added to his regimen of carvedilol and Entresto.  He was seen virtually on 02/07/2020 at which time he was doing well from a cardiac perspective.  He noted since starting hydralazine he had bilateral upper and lower extremity stiffness/tightness.  His weight was stable.  There were no symptoms concerning for volume overload.  His BP was running in the 140s over 90s.  Hydralazine was discontinued.  In an effort to minimize polypharmacy his carvedilol was titrated to 37.5 mg twice daily with plans to potentially de-escalate this when he is seen in follow-up.  Since he was seen virtually he was diagnosed with COVID-19 in late 01/2020, he did not require hospital admission.  High-sensitivity troponin was cycled with an initial value of 19 and a delta of 18.  He was seen by his PCP on 04/10/2020 with a BP of 136/80 and was overall doing well.  He comes in doing very well today.  No chest pain, dyspnea, palpitations, dizziness, presyncope, or syncope.  His BP is mildly elevated in the office today at triage at 150/100 though he attributes this to running behind and hurrying for his appointment.  BP has been well controlled at home.  No lower extremity swelling, abdominal distention, worsening orthopnea, PND, or early satiety.  He is tolerating all medications without issues.  As we were talking about escalating  GDMT it was noted he has urinary frequency and at times incontinence.  Otherwise, he does not have any issues or concerns at this time.   Labs independently reviewed: 01/2020 - potassium 4.4, BUN 20, serum creatinine 1.83, albumin 3.9, AST/ALT normal, Hgb 15.0, PLT 97 12/2019-TC 183, TG 170, HDL 42, LDL 118, A1c 5.7 02/2018 - TSH normal   Past Medical History:  Diagnosis Date  . Alcoholism and drug addiction in family   . Arthritis   . CHF  (congestive heart failure) ([place])   . Coronary artery disease   . Heart disease   . Hypertension   . Irregular heart beat   . Positive TB test   . Seizure [facility])    witnessed by family in restaurant    Past Surgical History:  Procedure Laterality Date  . CARDIAC CATHETERIZATION    . TONSILLECTOMY      Current Medications: Current Meds  Medication Sig  . albuterol (PROAIR HFA) 108 (90 Base) MCG/ACT inhaler Inhale 2 puffs into the lungs every 6 (six) hours as needed for wheezing or shortness of breath.  [name] aspirin EC 81 MG tablet Take 1 tablet (81 mg total) by mouth daily.  . budesonide-formoterol (SYMBICORT) 160-4.5 MCG/ACT inhaler TAKE 2 PUFFS BY MOUTH TWICE A DAY  . clotrimazole (LOTRIMIN) 1 % cream APPLY TO AFFECTED AREA TWICE A DAY  . rosuvastatin (CRESTOR) 10 MG tablet Take 1 tablet (10 mg total) by mouth daily.  . sacubitril-valsartan (ENTRESTO) 97-103 MG Take 1 tablet by mouth 2 (two) times daily.  . [DISCONTINUED] carvedilol (COREG) 25 MG tablet Take 1.5 tablets (37.5 mg total) by mouth 2 (two) times daily with a meal.    Allergies:   Penicillins   Social History   Socioeconomic History  . Marital status: Legally Separated    Spouse name: Not on file  . Number of children: 4  . Years of education: 34  . Highest education level: High school graduate  Occupational History  . Not on file  Tobacco Use  . Smoking status: Current Some Day Smoker    Packs/day: 0.50    Years: 50.00    Pack years: 25.00    Types: Cigarettes    Last attempt to quit: 02/23/2017    Years since quitting: 3.1  . Smokeless tobacco: Never Used  Vaping Use  . Vaping Use: Never used  Substance and Sexual Activity  . Alcohol use: No  . Drug use: Not Currently    Types: "Crack" cocaine, Heroin, Marijuana, LSD    Comment: Sober since 2018.   [name] Sexual activity: Yes    Birth control/protection: Condom  Other Topics Concern  . 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    Raped at 70 years old   Social Determinants of Radio broadcast assistant Strain: Not on file  Food Insecurity: Not on file  Transportation Needs: Not on file  Physical Activity: Not on file  Stress: Not on file  Social Connections: Not on file     Family History:  The patient's family history includes Alcohol abuse in his daughter, father, mother, and sister; Asthma in his sister; Depression  in his daughter and sister; Diabetes in his mother; Drug abuse in his daughter and sister; Early death in his daughter; Hyperlipidemia in his mother; Hypertension in his daughter, father, mother, and sister.  ROS:   Review of Systems  Constitutional: Negative for chills, diaphoresis, fever, malaise/fatigue and weight loss.  HENT: Negative for congestion.   Eyes: Negative for discharge and redness.  Respiratory: Negative for cough, sputum production, shortness of breath and wheezing.   Cardiovascular: Negative for chest pain, palpitations, orthopnea, claudication, leg swelling and PND.  Gastrointestinal: Negative for abdominal pain, heartburn, nausea and vomiting.  Genitourinary: Positive for frequency and urgency.       Urinary incontinence  Musculoskeletal: Negative for falls and myalgias.  Skin: Negative for rash.  Neurological: Negative for dizziness, tingling, tremors, sensory change, speech change, focal weakness, loss of consciousness and weakness.  Endo/Heme/Allergies: Does not bruise/bleed easily.  Psychiatric/Behavioral: Negative for substance abuse. The patient is not nervous/anxious.   All other systems reviewed and are negative.    EKGs/Labs/Other Studies Reviewed:    Studies reviewed were summarized above. The additional  studies were reviewed today:  2D echo 12/2016: - Left ventricle: The cavity size was moderately dilated. Wall  thickness was increased in a pattern of moderate LVH. There was  moderate concentric hypertrophy. Systolic function was severely  reduced. The estimated ejection fraction was in the range of 25%  to 30%. Regional wall motion abnormalities cannot be excluded.  Doppler parameters are consistent with abnormal left ventricular  relaxation (grade 1 diastolic dysfunction).  - Left atrium: The atrium was mildly dilated.  - Right ventricle: The cavity size was moderately dilated. Wall  thickness was normal.  - Right atrium: The atrium was mildly dilated.   Impressions:   - Moderate to severely depressed LVF  Globally depressed  Moderate to severe LVH concentrically  EF=25-30%  Mod dilation of RV  Mild/Mod depressed RVF. There was no evidence of a vegetation. __________  2D echo 04/2019: 1. Left ventricular ejection fraction, by visual estimation, is 25 to  30%. The left ventricle has moderate to severely decreased function. There  is severely increased left ventricular hypertrophy.  2. The left ventricle demonstrates global hypokinesis.  3. Left ventricular diastolic parameters are consistent with Grade I  diastolic dysfunction (impaired relaxation).  4. Global right ventricle has normal systolic function.The right  ventricular size is normal. No increase in right ventricular wall  thickness.  5. Left atrial size was normal.  6. TR signal is inadequate for assessing pulmonary artery systolic  pressure.  7. There is mild dilatation of the aortic root, 4.0 cm.   EKG:  EKG is not ordered today.   Recent Labs: 02/27/2020: ALT 10; BUN 20; Creatinine, Ser 1.83; Hemoglobin 15.0; Platelets 97; Potassium 4.4; Sodium 139  Recent Lipid Panel    Component Value Date/Time   CHOL 119 04/21/2020 1022   TRIG 77.0 04/21/2020 1022   HDL 45.40 04/21/2020  1022   CHOLHDL 3 04/21/2020 1022   VLDL 15.4 04/21/2020 1022   LDLCALC 58 04/21/2020 1022   LDLDIRECT 98.0 03/22/2018 1433    PHYSICAL EXAM:    VS:  BP (!) 150/100 (BP Location: Left Arm, Patient Position: Sitting, Cuff Size: Normal)   Pulse 70   Ht 6\' 2"  (1.88 m)   Wt 188 lb (85.3 kg)   SpO2 98%   BMI 24.14 kg/m   BMI: Body mass index is 24.14 kg/m.  Physical Exam Vitals reviewed.  Constitutional:  Appearance: He is well-developed and well-nourished.     Comments: Repeat BP 130/90  HENT:     Head: Normocephalic and atraumatic.  Eyes:     General:        Right eye: No discharge.        Left eye: No discharge.  Neck:     Vascular: No JVD.  Cardiovascular:     Rate and Rhythm: Normal rate and regular rhythm.     Pulses: No midsystolic click and no opening snap.          Posterior tibial pulses are 2+ on the right side and 2+ on the left side.     Heart sounds: Normal heart sounds, S1 normal and S2 normal. Heart sounds not distant. No murmur heard. No friction rub.  Pulmonary:     Effort: Pulmonary effort is normal. No respiratory distress.     Breath sounds: Normal breath sounds. No decreased breath sounds, wheezing or rales.  Chest:     Chest wall: No tenderness.  Abdominal:     General: There is no distension.     Palpations: Abdomen is soft.     Tenderness: There is no abdominal tenderness.  Musculoskeletal:        General: No edema.     Cervical back: Normal range of motion.  Skin:    General: Skin is warm and dry.     Nails: There is no clubbing or cyanosis.  Neurological:     Mental Status: He is alert and oriented to person, place, and time.  Psychiatric:        Mood and Affect: Mood and affect normal.        Speech: Speech normal.        Behavior: Behavior normal.        Thought Content: Thought content normal.        Judgment: Judgment normal.     Wt Readings from Last 3 Encounters:  04/24/20 188 lb (85.3 kg)  04/10/20 190 lb 4.8 oz (86.3  kg)  02/27/20 189 lb (85.7 kg)     ASSESSMENT & PLAN:   1. HFrEF secondary to NICM: He appears euvolemic and well compensated with NYHA class I symptoms.  His cardiomyopathy is likely in the context of prior significant polysubstance use.  Continue GDMT including carvedilol and Entresto.  Initially, our plan was to add either spironolactone or SGLT2 inhibitor today however, after discussing these medications with him he does report a significant history of urinary frequency, urgency, and incontinence and with this has significant reservations about starting these kinds of medications.  Given this the addition of either of these medications was deferred at this time.  There is also some reservation in starting spironolactone with his underlying CKD.  We will plan to repeat a limited echo at next visit to evaluate his LV systolic function.  If his EF remains less than 35% at that time we will refer him to EP for consideration of ICD.  CHF education.  2. Hypertensive heart disease: Blood pressure is reasonably controlled in the office today with initial reading being mildly elevated in the setting of hurrying for his appointment and noted improvement on recheck.  Decrease carvedilol back down to 25 mg twice daily as his initial 37.5 mg twice daily dosing was always planned to be temporary given the context of this escalation.  Otherwise, he will continue maximum dose Entresto.  Low-sodium diet and smoking cessation recommended.  3. CKD stage II: Stable on  most recent check.  4. 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  5. Dilated aortic root: Mildly dilated on echo from 04/2019.  Optimal blood pressure control recommended.  Plan to follow-up echo at next visit.  6. COPD with ongoing tobacco use: No active exacerbation.  Complete cessation of tobacco is recommended.  Disposition: F/u with Dr. Fletcher Anon or an APP in 3 months with planned follow-up echo at that time.   Medication Adjustments/Labs and Tests  Ordered: Current medicines are reviewed at length with the patient today.  Concerns regarding medicines are outlined above. Medication changes, Labs and Tests ordered today are summarized above and listed in the Patient Instructions accessible in Encounters.   SignedChristell Faith, PA-C 04/24/2020 12:07 PM     [address], [place] [address] [phone]

## 2020-04-21 ENCOUNTER — Other Ambulatory Visit (INDEPENDENT_AMBULATORY_CARE_PROVIDER_SITE_OTHER): Payer: Medicare Other

## 2020-04-21 ENCOUNTER — Other Ambulatory Visit: Payer: Self-pay

## 2020-04-21 DIAGNOSIS — E785 Hyperlipidemia, unspecified: Secondary | ICD-10-CM | POA: Diagnosis not present

## 2020-04-21 LAB — LIPID PANEL
Cholesterol: 119 mg/dL (ref 0–200)
HDL: 45.4 mg/dL (ref >39.00)
LDL Cholesterol: 58 mg/dL (ref 0–99)
NonHDL: 73.8
Total CHOL/HDL Ratio: 3
Triglycerides: 77 mg/dL (ref 0.0–149.0)
VLDL: 15.4 mg/dL (ref 0.0–40.0)

## 2020-04-24 ENCOUNTER — Other Ambulatory Visit: Payer: Self-pay

## 2020-04-24 ENCOUNTER — Ambulatory Visit (INDEPENDENT_AMBULATORY_CARE_PROVIDER_SITE_OTHER): Payer: Medicare Other | Admitting: Physician Assistant

## 2020-04-24 ENCOUNTER — Encounter: Payer: Self-pay | Admitting: Physician Assistant

## 2020-04-24 VITALS — BP 150/100 | HR 70 | Ht 74.0 in | Wt 188.0 lb

## 2020-04-24 DIAGNOSIS — I428 Other cardiomyopathies: Secondary | ICD-10-CM | POA: Diagnosis not present

## 2020-04-24 DIAGNOSIS — I493 Ventricular premature depolarization: Secondary | ICD-10-CM

## 2020-04-24 DIAGNOSIS — I5022 Chronic systolic (congestive) heart failure: Secondary | ICD-10-CM

## 2020-04-24 DIAGNOSIS — I77810 Thoracic aortic ectasia: Secondary | ICD-10-CM | POA: Diagnosis not present

## 2020-04-24 DIAGNOSIS — J449 Chronic obstructive pulmonary disease, unspecified: Secondary | ICD-10-CM

## 2020-04-24 DIAGNOSIS — I110 Hypertensive heart disease with heart failure: Secondary | ICD-10-CM | POA: Diagnosis not present

## 2020-04-24 DIAGNOSIS — Z720 Tobacco use: Secondary | ICD-10-CM | POA: Diagnosis not present

## 2020-04-24 DIAGNOSIS — N182 Chronic kidney disease, stage 2 (mild): Secondary | ICD-10-CM | POA: Diagnosis not present

## 2020-04-24 MED ORDER — CARVEDILOL 25 MG PO TABS: 25.0000 mg | ORAL_TABLET | Freq: Two times a day (BID) | ORAL | 3 refills | Status: DC

## 2020-04-24 NOTE — Patient Instructions (Signed)
Medication Instructions:  Your physician has recommended you make the following change in your medication:   1. DECREASE Carvedilol 25 mg to one tablet twice a day  *If you need a refill on your cardiac medications before your next appointment, please call your pharmacy*   Lab Work: None  If you have labs (blood work) drawn today and your tests are completely normal, you will receive your results only by: [name] MyChart Message (if you have MyChart) OR . A paper copy in the mail If you have any lab test that is abnormal or we need to change your treatment, we will call you to review the results.   Testing/Procedures: None   Follow-Up: At [facility], you and your health needs are our priority.  As part of our continuing mission to provide you with exceptional heart care, we have created designated Provider Care Teams.  These Care Teams include your primary Cardiologist (physician) and Advanced Practice Providers (APPs -  Physician Assistants and Nurse Practitioners) who all work together to provide you with the care you need, when you need it.  We recommend signing up for the patient portal called "MyChart".  Sign up information is provided on this After Visit Summary.  MyChart is used to connect with patients for Virtual Visits (Telemedicine).  Patients are able to view lab/test results, encounter notes, upcoming appointments, etc.  Non-urgent messages can be sent to your provider as well.   To learn more about what you can do with MyChart, go to [email or web address].    Your next appointment:   3 month(s)  The format for your next appointment:   In Person  Provider:   [name], MD or [name], PA-C

## 2020-04-29 ENCOUNTER — Ambulatory Visit (INDEPENDENT_AMBULATORY_CARE_PROVIDER_SITE_OTHER): Payer: Medicare Other

## 2020-04-29 ENCOUNTER — Telehealth: Payer: Self-pay

## 2020-04-29 VITALS — Ht 74.0 in | Wt 188.0 lb

## 2020-04-29 DIAGNOSIS — Z0000 Encounter for general adult medical examination without abnormal findings: Secondary | ICD-10-CM | POA: Diagnosis not present

## 2020-04-29 NOTE — Telephone Encounter (Signed)
Unable to reach patient for scheduled awv. No answer. Left message to call office and reschedule.
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
## 2020-04-29 NOTE — Patient Instructions (Addendum)
Jonathon Rios , Thank you for taking time to come for your Medicare Wellness Visit. I appreciate your ongoing commitment to your health goals. Please review the following plan we discussed and let me know if I can assist you in the future.   These are the goals we discussed: Goals      Patient Stated   .  Gain weight (pt-stated)      187-200lbs    .  Quit Smoking (pt-stated)      Smoke less until able to stop smoking       This is a list of the screening recommended for you and due dates:  Health Maintenance  Topic Date Due  . COVID-19 Vaccine (1) 08/10/2020*  . Cologuard (Stool DNA test)  04/01/2021  .  Hepatitis C: One time screening is recommended by Center for Disease Control  (CDC) for  adults born from [birth year] through 1963.   Completed  . HPV Vaccine  Aged Out  . Flu Shot  Discontinued  . Tetanus Vaccine  Discontinued  . Pneumonia vaccines  Discontinued  *Topic was postponed. The date shown is not the original due date.    Keep all routine maintenance appointments.   Follow up 08/10/20 @ 11:00  Advanced directives: none at this time.   Conditions/risks identified: none new.  Follow up in one year for your annual wellness visit.  Preventive Care 48 Years and Older, Male Preventive care refers to lifestyle choices and visits with your health care provider that can promote health and wellness. What does preventive care include?  A yearly physical exam. This is also called an annual well check.  Dental exams once or twice a year.  Routine eye exams. Ask your health care provider how often you should have your eyes checked.  Personal lifestyle choices, including:  Daily care of your teeth and gums.  Regular physical activity.  Eating a healthy diet.  Avoiding tobacco and drug use.  Limiting alcohol use.  Practicing safe sex.  Taking low doses of aspirin every day.  Taking vitamin and mineral supplements as recommended by your health care provider. What  happens during an annual well check? The services and screenings done by your health care provider during your annual well check will depend on your age, overall health, lifestyle risk factors, and family history of disease. Counseling  Your health care provider may ask you questions about your:  Alcohol use.  Tobacco use.  Drug use.  Emotional well-being.  Home and relationship well-being.  Sexual activity.  Eating habits.  History of falls.  Memory and ability to understand (cognition).  Work and work Statistician. Screening  You may have the following tests or measurements:  Height, weight, and BMI.  Blood pressure.  Lipid and cholesterol levels. These may be checked every 5 years, or more frequently if you are over 59 years old.  Skin check.  Lung cancer screening. You may have this screening every year starting at age 58 if you have a 30-pack-year history of smoking and currently smoke or have quit within the past 15 years.  Fecal occult blood test (FOBT) of the stool. You may have this test every year starting at age 43.  Flexible sigmoidoscopy or colonoscopy. You may have a sigmoidoscopy every 5 years or a colonoscopy every 10 years starting at age 24.  Prostate cancer screening. Recommendations will vary depending on your family history and other risks.  Hepatitis C blood test.  Hepatitis B blood test.  Sexually  transmitted disease (STD) testing.  Diabetes screening. This is done by checking your blood sugar (glucose) after you have not eaten for a while (fasting). You may have this done every 1-3 years.  Abdominal aortic aneurysm (AAA) screening. You may need this if you are a current or former smoker.  Osteoporosis. You may be screened starting at age 86 if you are at high risk. Talk with your health care provider about your test results, treatment options, and if necessary, the need for more tests. Vaccines  Your health care provider may recommend  certain vaccines, such as:  Influenza vaccine. This is recommended every year.  Tetanus, diphtheria, and acellular pertussis (Tdap, Td) vaccine. You may need a Td booster every 10 years.  Zoster vaccine. You may need this after age 54.  Pneumococcal 13-valent conjugate (PCV13) vaccine. One dose is recommended after age 73.  Pneumococcal polysaccharide (PPSV23) vaccine. One dose is recommended after age 45. Talk to your health care provider about which screenings and vaccines you need and how often you need them. This information is not intended to replace advice given to you by your health care provider. Make sure you discuss any questions you have with your health care provider. Document Released: 03/13/2015 Document Revised: 11/04/2015 Document Reviewed: 12/16/2014 Elsevier Interactive Patient Education  2017 [place] Prevention in the Home Falls can cause injuries. They can happen to people of all ages. There are many things you can do to make your home safe and to help prevent falls. What can I do on the outside of my home?  Regularly fix the edges of walkways and driveways and fix any cracks.  Remove anything that might make you trip as you walk through a door, such as a raised step or threshold.  Trim any bushes or trees on the path to your home.  Use bright outdoor lighting.  Clear any walking paths of anything that might make someone trip, such as rocks or tools.  Regularly check to see if handrails are loose or broken. Make sure that both sides of any steps have handrails.  Any raised decks and porches should have guardrails on the edges.  Have any leaves, snow, or ice cleared regularly.  Use sand or salt on walking paths during winter.  Clean up any spills in your garage right away. This includes oil or grease spills. What can I do in the bathroom?  Use night lights.  Install grab bars by the toilet and in the tub and shower. Do not use towel bars as  grab bars.  Use non-skid mats or decals in the tub or shower.  If you need to sit down in the shower, use a plastic, non-slip stool.  Keep the floor dry. Clean up any water that spills on the floor as soon as it happens.  Remove soap buildup in the tub or shower regularly.  Attach bath mats securely with double-sided non-slip rug tape.  Do not have throw rugs and other things on the floor that can make you trip. What can I do in the bedroom?  Use night lights.  Make sure that you have a light by your bed that is easy to reach.  Do not use any sheets or blankets that are too big for your bed. They should not hang down onto the floor.  Have a firm chair that has side arms. You can use this for support while you get dressed.  Do not have throw rugs and other things  on the floor that can make you trip. What can I do in the kitchen?  Clean up any spills right away.  Avoid walking on wet floors.  Keep items that you use a lot in easy-to-reach places.  If you need to reach something above you, use a strong step stool that has a grab bar.  Keep electrical cords out of the way.  Do not use floor polish or wax that makes floors slippery. If you must use wax, use non-skid floor wax.  Do not have throw rugs and other things on the floor that can make you trip. What can I do with my stairs?  Do not leave any items on the stairs.  Make sure that there are handrails on both sides of the stairs and use them. Fix handrails that are broken or loose. Make sure that handrails are as long as the stairways.  Check any carpeting to make sure that it is firmly attached to the stairs. Fix any carpet that is loose or worn.  Avoid having throw rugs at the top or bottom of the stairs. If you do have throw rugs, attach them to the floor with carpet tape.  Make sure that you have a light switch at the top of the stairs and the bottom of the stairs. If you do not have them, ask someone to add them  for you. What else can I do to help prevent falls?  Wear shoes that:  Do not have high heels.  Have rubber bottoms.  Are comfortable and fit you well.  Are closed at the toe. Do not wear sandals.  If you use a stepladder:  Make sure that it is fully opened. Do not climb a closed stepladder.  Make sure that both sides of the stepladder are locked into place.  Ask someone to hold it for you, if possible.  Clearly mark and make sure that you can see:  Any grab bars or handrails.  First and last steps.  Where the edge of each step is.  Use tools that help you move around (mobility aids) if they are needed. These include:  Canes.  Walkers.  Scooters.  Crutches.  Turn on the lights when you go into a dark area. Replace any light bulbs as soon as they burn out.  Set up your furniture so you have a clear path. Avoid moving your furniture around.  If any of your floors are uneven, fix them.  If there are any pets around you, be aware of where they are.  Review your medicines with your doctor. Some medicines can make you feel dizzy. This can increase your chance of falling. Ask your doctor what other things that you can do to help prevent falls. This information is not intended to replace advice given to you by your health care provider. Make sure you discuss any questions you have with your health care provider. Document Released: 12/11/2008 Document Revised: 07/23/2015 Document Reviewed: 03/21/2014 Elsevier Interactive Patient Education  2017 [employer].

## 2020-06-11 ENCOUNTER — Telehealth: Payer: Self-pay | Admitting: *Deleted

## 2020-06-11 ENCOUNTER — Encounter: Payer: Self-pay | Admitting: *Deleted

## 2020-06-11 DIAGNOSIS — Z122 Encounter for screening for malignant neoplasm of respiratory organs: Secondary | ICD-10-CM

## 2020-06-11 DIAGNOSIS — Z87891 Personal history of nicotine dependence: Secondary | ICD-10-CM

## 2020-06-11 DIAGNOSIS — F17200 Nicotine dependence, unspecified, uncomplicated: Secondary | ICD-10-CM

## 2020-06-11 NOTE — Telephone Encounter (Signed)
Received referral for initial lung cancer screening scan. Contacted patient and obtained smoking history,(current some day smoker, 0.5 ppd x 50 yrs) as well as answering questions related to screening process. Patient denies signs of lung cancer such as weight loss or hemoptysis. Patient denies comorbidity that would prevent curative treatment if lung cancer were found. Patient is scheduled for shared decision making visit and CT scan on 07/15/20 @ 10:15 am. Patient requested a reminder call the day before the scan.

## 2020-06-14 ENCOUNTER — Other Ambulatory Visit: Payer: Self-pay | Admitting: Physician Assistant

## 2020-07-12 ENCOUNTER — Other Ambulatory Visit: Payer: Self-pay | Admitting: Physician Assistant

## 2020-07-13 NOTE — Telephone Encounter (Signed)
Rx request sent to pharmacy.  

## 2020-07-15 ENCOUNTER — Other Ambulatory Visit: Payer: Self-pay

## 2020-07-15 ENCOUNTER — Ambulatory Visit
Admission: RE | Admit: 2020-07-15 | Discharge: 2020-07-15 | Disposition: A | Discharge status: Home / Self Care | Payer: Medicare Other | Source: Ambulatory Visit | Attending: Oncology | Admitting: Oncology

## 2020-07-15 ENCOUNTER — Inpatient Hospital Stay: Payer: Medicare Other | Attending: Oncology | Admitting: Hospice and Palliative Medicine

## 2020-07-15 DIAGNOSIS — Z122 Encounter for screening for malignant neoplasm of respiratory organs: Secondary | ICD-10-CM

## 2020-07-15 DIAGNOSIS — F17210 Nicotine dependence, cigarettes, uncomplicated: Secondary | ICD-10-CM | POA: Diagnosis not present

## 2020-07-15 DIAGNOSIS — F17200 Nicotine dependence, unspecified, uncomplicated: Secondary | ICD-10-CM | POA: Diagnosis not present

## 2020-07-15 DIAGNOSIS — Z87891 Personal history of nicotine dependence: Secondary | ICD-10-CM

## 2020-07-15 NOTE — Progress Notes (Signed)
Virtual Visit via Video Note  I connected with@ on 07/15/20 at@ by a video enabled telemedicine application and verified that I am speaking with the correct person using two identifiers.   I discussed the limitations of evaluation and management by telemedicine and the availability of in person appointments. The patient expressed understanding and agreed to proceed.  Location: Patient: [facility] Provider: Clinic   In accordance with CMS guidelines, patient has met eligibility criteria including age, absence of signs or symptoms of lung cancer.  Social History   Tobacco Use  . Smoking status: Current Some Day Smoker    Packs/day: 0.50    Years: 50.00    Pack years: 25.00    Types: Cigarettes    Last attempt to quit: 02/23/2017    Years since quitting: 3.3  . Smokeless tobacco: Never Used  Vaping Use  . Vaping Use: Never used  Substance Use Topics  . Alcohol use: No  . Drug use: Not Currently    Types: "Crack" cocaine, Heroin, Marijuana, LSD    Comment: Sober since 2018.       A shared decision-making session was conducted prior to the performance of CT scan. This includes one or more decision aids, includes benefits and harms of screening, follow-up diagnostic testing, over-diagnosis, false positive rate, and total radiation exposure.   Counseling on the importance of adherence to annual lung cancer LDCT screening, impact of co-morbidities, and ability or willingness to undergo diagnosis and treatment is imperative for compliance of the program.   Counseling on the importance of continued smoking cessation for former smokers; the importance of smoking cessation for current smokers, and information about tobacco cessation interventions have been given to patient including [place] [facility]uit [facility] and 1800 quit [place] programs.   Written order for lung cancer screening with LDCT has been given to the patient and any and all questions have been answered to the best of my abilities.    Yearly  follow up will be coordinated by Shawn Perkins, Thoracic Navigator.  Time Total: 15 minutes  Visit consisted of counseling and education dealing with complex health screening. Greater than 50%  of this time was spent counseling and coordinating care related to the above assessment and plan.  Signed by: Joshua Borders, PhD, NP-C  

## 2020-07-21 ENCOUNTER — Telehealth: Payer: Self-pay | Admitting: Physician Assistant

## 2020-07-21 NOTE — Telephone Encounter (Signed)
Spoke with the patient. Patient was not aware that he has an appt with [name], PA this Ludwig Clarks 07/24/20 @ 1:30pm.  Patient sts that he has been having intermittent sob and chest tightness for 3 days. No n/v, diaphoresis, palpitations. Chest discomfort does not radiate and is non-exertional.  Adv the patient that his call was received minutes before 5pm and we would not be able to evaluate him today. Adv the patient that if felt he was in distress he should go to a urgent care or the ED to be evaluated.  Patient sts that he will just keep his 07/24/20 appt if stable. If his symptoms worsen he will go to the ED.

## 2020-07-21 NOTE — Telephone Encounter (Signed)
Pt c/o Shortness Of Breath: STAT if SOB developed within the last 24 hours or pt is noticeably SOB on the phone  1. Are you currently SOB (can you hear that pt is SOB on the phone)? Yes also on phone  2. How long have you been experiencing SOB? Yes since Sunday 07/19/20  3. Are you SOB when sitting or when up moving around? Both   4. Are you currently experiencing any other symptoms? Feeling tightness in chest

## 2020-07-23 ENCOUNTER — Encounter: Payer: Self-pay | Admitting: Family

## 2020-07-23 ENCOUNTER — Encounter: Payer: Self-pay | Admitting: *Deleted

## 2020-07-23 DIAGNOSIS — I700 Atherosclerosis of aorta: Secondary | ICD-10-CM | POA: Insufficient documentation

## 2020-07-24 ENCOUNTER — Encounter: Payer: Self-pay | Admitting: Cardiovascular Disease

## 2020-07-24 ENCOUNTER — Ambulatory Visit: Payer: Medicare Other | Admitting: Physician Assistant

## 2020-07-24 ENCOUNTER — Telehealth: Payer: Self-pay | Admitting: Cardiovascular Disease

## 2020-07-24 ENCOUNTER — Other Ambulatory Visit: Payer: Self-pay

## 2020-07-24 ENCOUNTER — Ambulatory Visit (INDEPENDENT_AMBULATORY_CARE_PROVIDER_SITE_OTHER): Payer: Medicare Other | Admitting: Cardiovascular Disease

## 2020-07-24 VITALS — BP 130/90 | HR 79 | Ht 74.0 in | Wt 188.0 lb

## 2020-07-24 DIAGNOSIS — I5023 Acute on chronic systolic (congestive) heart failure: Secondary | ICD-10-CM | POA: Diagnosis not present

## 2020-07-24 DIAGNOSIS — Z720 Tobacco use: Secondary | ICD-10-CM | POA: Diagnosis not present

## 2020-07-24 DIAGNOSIS — I10 Essential (primary) hypertension: Secondary | ICD-10-CM | POA: Diagnosis not present

## 2020-07-24 DIAGNOSIS — R0602 Shortness of breath: Secondary | ICD-10-CM

## 2020-07-24 DIAGNOSIS — I200 Unstable angina: Secondary | ICD-10-CM | POA: Diagnosis not present

## 2020-07-24 DIAGNOSIS — I493 Ventricular premature depolarization: Secondary | ICD-10-CM

## 2020-07-24 MED ORDER — FUROSEMIDE 20 MG PO TABS: 20.0000 mg | ORAL_TABLET | Freq: Every day | ORAL | 5 refills | Status: DC

## 2020-07-24 NOTE — Telephone Encounter (Signed)
Noted  

## 2020-07-24 NOTE — Telephone Encounter (Signed)
lmov to notify patient echo will be at 945 am at [facility]  5/31 .

## 2020-07-24 NOTE — H&P (View-Only) (Signed)
Cardiology Office Note   Date:  07/24/2020   ID:  Jonathon Rios, Jonathon Rios June 15, 1950, MRN [id number]  PCP:  [name], FNP  Cardiologist:   [name], MD   Chief Complaint  Patient presents with  . 3 month follow up     Patient c/o chest pain and shortness of breath that comes and goes. Medications reviewed by the patient verbally.       History of Present Illness: Jonathon Rios is a 70 y.o. male who is here today for follow-up visit regarding chronic systolic heart failure due to hypertensive heart disease.   He has known history of chronic systolic heart failure, mild chronic kidney disease, hepatitis C, essential hypertension, tobacco use, excessive alcohol use and previous drug use.  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  He has been incarcerated a few times. [name]  He underwent treatment for hepatitis C and he was cured.  He was diagnosed with systolic heart failure years ago when he lived in [place].  He had cardiac catheterization done at that time and was told there was no significant coronary artery disease. Echocardiogram in November 2018 showed an EF of 25 to 30%.   He attended drug rehab and has been drug-free for 6 years. Most recent echocardiogram in February 2021 showed an EF of 25 to 30%.  The aortic root was mildly dilated at 4 cm.  He has not been feeling well since Sunday.  He reports worsening exertional dyspnea with orthopnea.  He is afraid to go to sleep due to the development of shortness of breath.  In addition, he has been experiencing substernal chest pressure and tightness lasting for 5 to 15 minutes.  He feels very tired which is unusual for him.  He did have COVID infection in December 2021.  He has been taking his cardiac medications regularly.  He continues to smoke half a pack per day.  No drugs or alcohol use at the present time.  Past Medical History:  Diagnosis Date  . Alcoholism and drug addiction in  family   . Arthritis   . CHF (congestive heart failure) ([place])   . Coronary artery disease   . Heart disease   . Hypertension   . Irregular heart beat   . Positive TB test   . Seizure [facility])    witnessed by family in restaurant    Past Surgical History:  Procedure Laterality Date  . CARDIAC CATHETERIZATION    . TONSILLECTOMY       Current Outpatient Medications  Medication Sig Dispense Refill  . albuterol (PROAIR HFA) 108 (90 Base) MCG/ACT inhaler Inhale 2 puffs into the lungs every 6 (six) hours as needed for wheezing or shortness of breath. 1 each 0  . aspirin EC 81 MG tablet Take 1 tablet (81 mg total) by mouth daily.    . budesonide-formoterol (SYMBICORT) 160-4.5 MCG/ACT inhaler TAKE 2 PUFFS BY MOUTH TWICE A DAY 1 each 0  . carvedilol (COREG) 25 MG tablet Take 1 tablet (25 mg total) by mouth 2 (two) times daily with a meal. 180 tablet 3  . clotrimazole (LOTRIMIN) 1 % cream APPLY TO AFFECTED AREA TWICE A DAY 30 g 2  . ENTRESTO 97-103 MG TAKE 1 TABLET BY MOUTH TWICE A DAY 60 tablet 6  . rosuvastatin (CRESTOR) 10 MG tablet Take 1 tablet (10 mg total) by mouth daily. 90 tablet 3   No current facility-administered  medications for this visit.    Allergies:   Penicillins    Social History:  The patient  reports that he has been smoking cigarettes. He has a 25.00 pack-year smoking history. He has never used smokeless tobacco. He reports previous drug use. Drugs: "Crack" cocaine, Heroin, Marijuana, and LSD. He reports that he does not drink alcohol.   Family History:  The patient's family history includes Alcohol abuse in his daughter, father, mother, and sister; Asthma in his sister; Depression in his daughter and sister; Diabetes in his mother; Drug abuse in his daughter and sister; Early death in his daughter; Hyperlipidemia in his mother; Hypertension in his daughter, father, mother, and sister.    ROS:  Please see the history of present illness.   Otherwise, review of systems  are positive for none.   All other systems are reviewed and negative.    PHYSICAL EXAM: VS:  BP 130/90 (BP Location: Left Arm, Patient Position: Sitting, Cuff Size: Normal)   Pulse 79   Ht 6\' 2"  (1.88 m)   Wt 188 lb (85.3 kg)   SpO2 98%   BMI 24.14 kg/m  , BMI Body mass index is 24.14 kg/m. GEN: Well nourished, well developed, in no acute distress  HEENT: normal  Neck: no carotid bruits, or masses.  Mild JVD Cardiac: RRR; no murmurs, rubs, or gallops,no edema  Respiratory: Crackles at the right base, normal work of breathing GI: soft, nontender, nondistended, + BS MS: no deformity or atrophy  Skin: warm and dry, no rash Neuro:  Strength and sensation are intact Psych: euthymic mood, full affect   EKG:  EKG is ordered today. The ekg ordered today demonstrates normal sinus rhythm with LVH with repolarization abnormalities.   Recent Labs: 02/27/2020: ALT 10; BUN 20; Creatinine, Ser 1.83; Hemoglobin 15.0; Platelets 97; Potassium 4.4; Sodium 139    Lipid Panel    Component Value Date/Time   CHOL 119 04/21/2020 1022   TRIG 77.0 04/21/2020 1022   HDL 45.40 04/21/2020 1022   CHOLHDL 3 04/21/2020 1022   VLDL 15.4 04/21/2020 1022   LDLCALC 58 04/21/2020 1022   LDLDIRECT 98.0 03/22/2018 1433      Wt Readings from Last 3 Encounters:  07/24/20 188 lb (85.3 kg)  07/15/20 192 lb (87.1 kg)  04/29/20 188 lb (85.3 kg)       No flowsheet data found.    ASSESSMENT AND PLAN:  1.  Unstable angina: The patient is now having worsening heart failure symptoms with associated substernal chest pain and tightness.  This is concerning given that he has been stable over the last few years. Due to that, I recommend evaluation with a right and left cardiac catheterization and possible PCI.  I discussed the procedure in details as well as risk and benefits. We will test the patient for COVID before the procedure given his symptoms.  2. Acute on chronic systolic heart failure with  severely reduced LV systolic function: The patient has been stable for few years up until recently when he started having dyspnea with minimal exertion, orthopnea and substernal chest tightness.  He appears to be mildly volume overloaded.  I elected to add Lasix 20 mg daily.  I requested an echocardiogram to be done before his cardiac catheterization given underlying chronic kidney disease. I also requested a chest x-ray.  3.  Frequent PVCs: Well-controlled with carvedilol.  4. Essential hypertension: Blood pressure is reasonably controlled on current medication.  5.  Tobacco use: I discussed the importance  of smoking cessation.  6.  Chronic kidney disease: Check labs before cardiac catheterization.    Disposition:   FU with me in 1 months  Signed,  [name], MD  07/24/2020 11:01 AM    [place]

## 2020-07-24 NOTE — Progress Notes (Signed)
Cardiology Office Note   Date:  07/24/2020   ID:  Jonathon, Rios 1950-04-10, MRN [id number]  PCP:  [name], FNP  Cardiologist:   [name], MD   Chief Complaint  Patient presents with  . 3 month follow up     Patient c/o chest pain and shortness of breath that comes and goes. Medications reviewed by the patient verbally.       History of Present Illness: Jonathon Rios is a 70 y.o. male who is here today for follow-up visit regarding chronic systolic heart failure due to hypertensive heart disease.   He has known history of chronic systolic heart failure, mild chronic kidney disease, hepatitis C, essential hypertension, tobacco use, excessive alcohol use and previous drug use.  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  He has been incarcerated a few times. [name]  He underwent treatment for hepatitis C and he was cured.  He was diagnosed with systolic heart failure years ago when he lived in [place].  He had cardiac catheterization done at that time and was told there was no significant coronary artery disease. Echocardiogram in November 2018 showed an EF of 25 to 30%.   He attended drug rehab and has been drug-free for 6 years. Most recent echocardiogram in February 2021 showed an EF of 25 to 30%.  The aortic root was mildly dilated at 4 cm.  He has not been feeling well since Sunday.  He reports worsening exertional dyspnea with orthopnea.  He is afraid to go to sleep due to the development of shortness of breath.  In addition, he has been experiencing substernal chest pressure and tightness lasting for 5 to 15 minutes.  He feels very tired which is unusual for him.  He did have COVID infection in December 2021.  He has been taking his cardiac medications regularly.  He continues to smoke half a pack per day.  No drugs or alcohol use at the present time.  Past Medical History:  Diagnosis Date  . Alcoholism and drug addiction in  family   . Arthritis   . CHF (congestive heart failure) ([place])   . Coronary artery disease   . Heart disease   . Hypertension   . Irregular heart beat   . Positive TB test   . Seizure [facility])    witnessed by family in restaurant    Past Surgical History:  Procedure Laterality Date  . CARDIAC CATHETERIZATION    . TONSILLECTOMY       Current Outpatient Medications  Medication Sig Dispense Refill  . albuterol (PROAIR HFA) 108 (90 Base) MCG/ACT inhaler Inhale 2 puffs into the lungs every 6 (six) hours as needed for wheezing or shortness of breath. 1 each 0  . aspirin EC 81 MG tablet Take 1 tablet (81 mg total) by mouth daily.    . budesonide-formoterol (SYMBICORT) 160-4.5 MCG/ACT inhaler TAKE 2 PUFFS BY MOUTH TWICE A DAY 1 each 0  . carvedilol (COREG) 25 MG tablet Take 1 tablet (25 mg total) by mouth 2 (two) times daily with a meal. 180 tablet 3  . clotrimazole (LOTRIMIN) 1 % cream APPLY TO AFFECTED AREA TWICE A DAY 30 g 2  . ENTRESTO 97-103 MG TAKE 1 TABLET BY MOUTH TWICE A DAY 60 tablet 6  . rosuvastatin (CRESTOR) 10 MG tablet Take 1 tablet (10 mg total) by mouth daily. 90 tablet 3   No current facility-administered  medications for this visit.    Allergies:   Penicillins    Social History:  The patient  reports that he has been smoking cigarettes. He has a 25.00 pack-year smoking history. He has never used smokeless tobacco. He reports previous drug use. Drugs: "Crack" cocaine, Heroin, Marijuana, and LSD. He reports that he does not drink alcohol.   Family History:  The patient's family history includes Alcohol abuse in his daughter, father, mother, and sister; Asthma in his sister; Depression in his daughter and sister; Diabetes in his mother; Drug abuse in his daughter and sister; Early death in his daughter; Hyperlipidemia in his mother; Hypertension in his daughter, father, mother, and sister.    ROS:  Please see the history of present illness.   Otherwise, review of systems  are positive for none.   All other systems are reviewed and negative.    PHYSICAL EXAM: VS:  BP 130/90 (BP Location: Left Arm, Patient Position: Sitting, Cuff Size: Normal)   Pulse 79   Ht 6\' 2"  (1.88 m)   Wt 188 lb (85.3 kg)   SpO2 98%   BMI 24.14 kg/m  , BMI Body mass index is 24.14 kg/m. GEN: Well nourished, well developed, in no acute distress  HEENT: normal  Neck: no carotid bruits, or masses.  Mild JVD Cardiac: RRR; no murmurs, rubs, or gallops,no edema  Respiratory: Crackles at the right base, normal work of breathing GI: soft, nontender, nondistended, + BS MS: no deformity or atrophy  Skin: warm and dry, no rash Neuro:  Strength and sensation are intact Psych: euthymic mood, full affect   EKG:  EKG is ordered today. The ekg ordered today demonstrates normal sinus rhythm with LVH with repolarization abnormalities.   Recent Labs: 02/27/2020: ALT 10; BUN 20; Creatinine, Ser 1.83; Hemoglobin 15.0; Platelets 97; Potassium 4.4; Sodium 139    Lipid Panel    Component Value Date/Time   CHOL 119 04/21/2020 1022   TRIG 77.0 04/21/2020 1022   HDL 45.40 04/21/2020 1022   CHOLHDL 3 04/21/2020 1022   VLDL 15.4 04/21/2020 1022   LDLCALC 58 04/21/2020 1022   LDLDIRECT 98.0 03/22/2018 1433      Wt Readings from Last 3 Encounters:  07/24/20 188 lb (85.3 kg)  07/15/20 192 lb (87.1 kg)  04/29/20 188 lb (85.3 kg)       No flowsheet data found.    ASSESSMENT AND PLAN:  1.  Unstable angina: The patient is now having worsening heart failure symptoms with associated substernal chest pain and tightness.  This is concerning given that he has been stable over the last few years. Due to that, I recommend evaluation with a right and left cardiac catheterization and possible PCI.  I discussed the procedure in details as well as risk and benefits. We will test the patient for COVID before the procedure given his symptoms.  2. Acute on chronic systolic heart failure with  severely reduced LV systolic function: The patient has been stable for few years up until recently when he started having dyspnea with minimal exertion, orthopnea and substernal chest tightness.  He appears to be mildly volume overloaded.  I elected to add Lasix 20 mg daily.  I requested an echocardiogram to be done before his cardiac catheterization given underlying chronic kidney disease. I also requested a chest x-ray.  3.  Frequent PVCs: Well-controlled with carvedilol.  4. Essential hypertension: Blood pressure is reasonably controlled on current medication.  5.  Tobacco use: I discussed the importance  of smoking cessation.  6.  Chronic kidney disease: Check labs before cardiac catheterization.    Disposition:   FU with me in 1 months  Signed,  [name], MD  07/24/2020 11:01 AM    [place]

## 2020-07-24 NOTE — Patient Instructions (Signed)
Medication Instructions:  Your physician has recommended you make the following change in your medication:   START Lasix (furosemide) 20 mg daily. An Rx has been sent to your pharmacy.  *If you need a refill on your cardiac medications before your next appointment, please call your pharmacy*   Lab Work: Bmp and Cbc today   You will need a COVID test on 07/28/20. Please report to the medical arts pre-admit test office on Tues 07/28/20 between 8am-12pm. Their office is located on the first floor to the right of the entrance.  If you have labs (blood work) drawn today and your tests are completely normal, you will receive your results only by: [name] MyChart Message (if you have MyChart) OR . A paper copy in the mail If you have any lab test that is abnormal or we need to change your treatment, we will call you to review the results.   Testing/Procedures: A chest x-ray takes a picture of the organs and structures inside the chest, including the heart, lungs, and blood vessels. This test can show several things, including, whether the heart is enlarges; whether fluid is building up in the lungs; and whether pacemaker / defibrillator leads are still in place. (To be done today please stop at the [place] registration desk to check in)  Your physician has requested that you have an echocardiogram. Echocardiography is a painless test that uses sound waves to create images of your heart. It provides your doctor with information about the size and shape of your heart and how well your heart's chambers and valves are working. This procedure takes approximately one hour. There are no restrictions for this procedure. (Needs to be scheduled before 07/30/20)  Your physician has requested that you have a cardiac catheterization. Cardiac catheterization is used to diagnose and/or treat various heart conditions. Doctors may recommend this procedure for a number of different reasons. The most common reason is  to evaluate chest pain. Chest pain can be a symptom of coronary artery disease (CAD), and cardiac catheterization can show whether plaque is narrowing or blocking your heart's arteries. This procedure is also used to evaluate the valves, as well as measure the blood flow and oxygen levels in different parts of your heart. For further information please visit [email or web address]. Please follow instruction sheet, as given.     Follow-Up: At [facility], you and your health needs are our priority.  As part of our continuing mission to provide you with exceptional heart care, we have created designated Provider Care Teams.  These Care Teams include your primary Cardiologist (physician) and Advanced Practice Providers (APPs -  Physician Assistants and Nurse Practitioners) who all work together to provide you with the care you need, when you need it.  We recommend signing up for the patient portal called "MyChart".  Sign up information is provided on this After Visit Summary.  MyChart is used to connect with patients for Virtual Visits (Telemedicine).  Patients are able to view lab/test results, encounter notes, upcoming appointments, etc.  Non-urgent messages can be sent to your provider as well.   To learn more about what you can do with MyChart, go to [email or web address].    Your next appointment:   4 week(s)  The format for your next appointment:   In Person  Provider:   You may see [name], MD or one of the following Advanced Practice Providers on your designated Care Team:    Murray Hodgkins, NP  [name], PA-C  Marrianne Mood, PA-C  Cadence Kathlen Mody, PA-C  Laurann [place], NP    Other Instructions     [address] [place], [place] [address] Dept: [phone] Loc: [phone]  Jonathon Rios  07/24/2020  You are scheduled for a Cardiac Catheterization on  Thursday, June 2 with Dr. [name].  1. Please arrive at [place] entrance: at 9:00 AM (This time is one hour before your procedure to ensure your preparation). Free valet parking service is available.   Special note: Every effort is made to have your procedure done on time. Please understand that emergencies sometimes delay scheduled procedures.  2. Diet: Do not eat solid foods after midnight.  The patient may have clear liquids until 5am upon the day of the procedure.  3. Labs: You will need to have blood drawn today. You will need a COVID test on 07/28/20  4. Medication instructions in preparation for your procedure:   Contrast Allergy: No     Stop taking, Lasix (Furosemide)  Thursday, June 2,   On the morning of your procedure, take your Aspirin and any morning medicines NOT listed above.  You may use sips of water.  5. Plan for one night stay--bring personal belongings. 6. Bring a current list of your medications and current insurance cards. 7. You MUST have a responsible person to drive you home. 8. Someone MUST be with you the first 24 hours after you arrive home or your discharge will be delayed. 9. Please wear clothes that are easy to get on and off and wear slip-on shoes.  Thank you for allowing [place] to care for you!   -- [facility]

## 2020-07-24 NOTE — Telephone Encounter (Signed)
Patient needs urgent echo pre cath   Spoke with Zollie Pee at [facility].  Ok to [place] and lm with [employer].   Patient aware echo will be worked in for Tuesday and to expect a call back to arrange time.

## 2020-07-25 LAB — BASIC METABOLIC PANEL
BUN/Creatinine Ratio: 16 (ref 10–24)
BUN: 27 mg/dL (ref 8–27)
CO2: 22 mmol/L (ref 20–29)
Calcium: 9.1 mg/dL (ref 8.6–10.2)
Chloride: 105 mmol/L (ref 96–106)
Creatinine, Ser: 1.68 mg/dL — ABNORMAL HIGH (ref 0.76–1.27)
Glucose: 94 mg/dL (ref 65–99)
Potassium: 4.4 mmol/L (ref 3.5–5.2)
Sodium: 145 mmol/L — ABNORMAL HIGH (ref 134–144)
eGFR: 44 mL/min/{1.73_m2} — ABNORMAL LOW (ref >59)

## 2020-07-25 LAB — CBC WITH DIFFERENTIAL/PLATELET
Basophils Absolute: 0 10*3/uL (ref 0.0–0.2)
Basos: 1 %
EOS (ABSOLUTE): 0.2 10*3/uL (ref 0.0–0.4)
Eos: 4 %
Hematocrit: 42.4 % (ref 37.5–51.0)
Hemoglobin: 14.1 g/dL (ref 13.0–17.7)
Immature Grans (Abs): 0 10*3/uL (ref 0.0–0.1)
Immature Granulocytes: 0 %
Lymphocytes Absolute: 1.5 10*3/uL (ref 0.7–3.1)
Lymphs: 32 %
MCH: 29.5 pg (ref 26.6–33.0)
MCHC: 33.3 g/dL (ref 31.5–35.7)
MCV: 89 fL (ref 79–97)
Monocytes Absolute: 0.5 10*3/uL (ref 0.1–0.9)
Monocytes: 10 %
Neutrophils Absolute: 2.6 10*3/uL (ref 1.4–7.0)
Neutrophils: 53 %
Platelets: 103 10*3/uL — ABNORMAL LOW (ref 150–450)
RBC: 4.78 x10E6/uL (ref 4.14–5.80)
RDW: 13.2 % (ref 11.6–15.4)
WBC: 4.8 10*3/uL (ref 3.4–10.8)

## 2020-07-28 ENCOUNTER — Other Ambulatory Visit
Admission: RE | Admit: 2020-07-28 | Discharge: 2020-07-28 | Disposition: A | Discharge status: Home / Self Care | Payer: Medicare Other | Source: Ambulatory Visit | Attending: Cardiovascular Disease | Admitting: Cardiovascular Disease

## 2020-07-28 ENCOUNTER — Telehealth: Payer: Self-pay | Admitting: Cardiovascular Disease

## 2020-07-28 ENCOUNTER — Other Ambulatory Visit: Payer: Self-pay

## 2020-07-28 ENCOUNTER — Ambulatory Visit: Admission: RE | Admit: 2020-07-28 | Payer: Medicare Other | Source: Ambulatory Visit

## 2020-07-28 DIAGNOSIS — Z20822 Contact with and (suspected) exposure to covid-19: Secondary | ICD-10-CM | POA: Diagnosis not present

## 2020-07-28 DIAGNOSIS — Z01812 Encounter for preprocedural laboratory examination: Secondary | ICD-10-CM | POA: Insufficient documentation

## 2020-07-28 LAB — SARS CORONAVIRUS 2 (TAT 6-24 HRS): SARS Coronavirus 2: NEGATIVE

## 2020-07-28 NOTE — Telephone Encounter (Signed)
Patient no show for echo.  Attempted to contact to reschedule.   Spoke with patient and reminded him covid test needed today before 12 and to call back for new echo time and date.    Patient agreed to call back.     Inform patient new appt for echo at [facility] is scheduled for 945 am tomorrow 6/1.   Scheduled with [facility] pending epic submission.

## 2020-07-29 ENCOUNTER — Ambulatory Visit
Admission: RE | Admit: 2020-07-29 | Discharge: 2020-07-29 | Disposition: A | Discharge status: Home / Self Care | Payer: Medicare Other | Source: Ambulatory Visit | Attending: Cardiovascular Disease | Admitting: Cardiovascular Disease

## 2020-07-29 DIAGNOSIS — I110 Hypertensive heart disease with heart failure: Secondary | ICD-10-CM | POA: Diagnosis not present

## 2020-07-29 DIAGNOSIS — I081 Rheumatic disorders of both mitral and tricuspid valves: Secondary | ICD-10-CM | POA: Diagnosis not present

## 2020-07-29 DIAGNOSIS — I2510 Atherosclerotic heart disease of native coronary artery without angina pectoris: Secondary | ICD-10-CM | POA: Insufficient documentation

## 2020-07-29 DIAGNOSIS — I5023 Acute on chronic systolic (congestive) heart failure: Secondary | ICD-10-CM

## 2020-07-29 LAB — ECHOCARDIOGRAM COMPLETE
AR max vel: 2.87 cm2
AV Area VTI: 2.51 cm2
AV Area mean vel: 2.73 cm2
AV Mean grad: 3 mmHg
AV Peak grad: 5.1 mmHg
Ao pk vel: 1.13 m/s
Area-P 1/2: 6.9 cm2
Calc EF: 40 %
MV VTI: 2.86 cm2
S' Lateral: 4.9 cm
Single Plane A2C EF: 37.7 %
Single Plane A4C EF: 42.1 %

## 2020-07-29 NOTE — Progress Notes (Signed)
*  PRELIMINARY RESULTS* Echocardiogram 2D Echocardiogram has been performed.  Jonathon Rios 07/29/2020, 10:43 AM

## 2020-07-30 ENCOUNTER — Encounter: Payer: Self-pay | Admitting: Cardiovascular Disease

## 2020-07-30 ENCOUNTER — Encounter
Admission: RE | Disposition: A | Discharge status: Home / Self Care | Payer: Self-pay | Source: Home / Self Care | Attending: Cardiovascular Disease

## 2020-07-30 ENCOUNTER — Other Ambulatory Visit: Payer: Self-pay

## 2020-07-30 ENCOUNTER — Ambulatory Visit
Admission: RE | Admit: 2020-07-30 | Discharge: 2020-07-30 | Disposition: A | Discharge status: Home / Self Care | Payer: Medicare Other | Attending: Cardiovascular Disease | Admitting: Cardiovascular Disease

## 2020-07-30 DIAGNOSIS — I130 Hypertensive heart and chronic kidney disease with heart failure and stage 1 through stage 4 chronic kidney disease, or unspecified chronic kidney disease: Secondary | ICD-10-CM | POA: Diagnosis not present

## 2020-07-30 DIAGNOSIS — I272 Pulmonary hypertension, unspecified: Secondary | ICD-10-CM | POA: Diagnosis not present

## 2020-07-30 DIAGNOSIS — I200 Unstable angina: Secondary | ICD-10-CM | POA: Diagnosis not present

## 2020-07-30 DIAGNOSIS — Z7951 Long term (current) use of inhaled steroids: Secondary | ICD-10-CM | POA: Diagnosis not present

## 2020-07-30 DIAGNOSIS — I25110 Atherosclerotic heart disease of native coronary artery with unstable angina pectoris: Secondary | ICD-10-CM | POA: Insufficient documentation

## 2020-07-30 DIAGNOSIS — F17210 Nicotine dependence, cigarettes, uncomplicated: Secondary | ICD-10-CM | POA: Insufficient documentation

## 2020-07-30 DIAGNOSIS — Z79899 Other long term (current) drug therapy: Secondary | ICD-10-CM | POA: Insufficient documentation

## 2020-07-30 DIAGNOSIS — I5021 Acute systolic (congestive) heart failure: Secondary | ICD-10-CM

## 2020-07-30 DIAGNOSIS — Z880 Allergy status to penicillin: Secondary | ICD-10-CM | POA: Insufficient documentation

## 2020-07-30 DIAGNOSIS — N189 Chronic kidney disease, unspecified: Secondary | ICD-10-CM | POA: Diagnosis not present

## 2020-07-30 DIAGNOSIS — I5023 Acute on chronic systolic (congestive) heart failure: Secondary | ICD-10-CM | POA: Diagnosis not present

## 2020-07-30 DIAGNOSIS — Z7982 Long term (current) use of aspirin: Secondary | ICD-10-CM | POA: Insufficient documentation

## 2020-07-30 HISTORY — PX: RIGHT/LEFT HEART CATH AND CORONARY ANGIOGRAPHY: CATH118266

## 2020-07-30 SURGERY — RIGHT/LEFT HEART CATH AND CORONARY ANGIOGRAPHY
Anesthesia: Moderate Sedation

## 2020-07-30 MED ORDER — VERAPAMIL HCL 2.5 MG/ML IV SOLN
INTRAVENOUS | Status: DC | PRN
  Administered 2020-07-30: 2.5 mg via INTRA_ARTERIAL

## 2020-07-30 MED ORDER — METOPROLOL TARTRATE 5 MG/5ML IV SOLN
INTRAVENOUS | Status: AC
  Administered 2020-07-30: 10 mg via INTRAVENOUS
  Filled 2020-07-30: qty 10

## 2020-07-30 MED ORDER — HEPARIN (PORCINE) IN NACL 1000-0.9 UT/500ML-% IV SOLN
INTRAVENOUS | Status: AC
  Filled 2020-07-30: qty 1000

## 2020-07-30 MED ORDER — FENTANYL CITRATE (PF) 100 MCG/2ML IJ SOLN
INTRAMUSCULAR | Status: DC | PRN
  Administered 2020-07-30: 50 ug via INTRAVENOUS

## 2020-07-30 MED ORDER — ASPIRIN 81 MG PO CHEW
81.0000 mg | CHEWABLE_TABLET | ORAL | Status: DC
Start: 2020-07-31 — End: 2020-07-30

## 2020-07-30 MED ORDER — HEPARIN (PORCINE) IN NACL 1000-0.9 UT/500ML-% IV SOLN
INTRAVENOUS | Status: DC | PRN
  Administered 2020-07-30: 1000 mL

## 2020-07-30 MED ORDER — SODIUM CHLORIDE 0.9% FLUSH
3.0000 mL | Freq: Two times a day (BID) | INTRAVENOUS | Status: DC
Start: 2020-07-30 — End: 2020-07-30

## 2020-07-30 MED ORDER — LABETALOL HCL 5 MG/ML IV SOLN: 10.0000 mg | INTRAVENOUS | Status: DC | PRN

## 2020-07-30 MED ORDER — IOHEXOL 300 MG/ML  SOLN
INTRAMUSCULAR | Status: DC | PRN
Start: 2020-07-30 — End: 2020-07-30
  Administered 2020-07-30: 36 mL

## 2020-07-30 MED ORDER — SODIUM CHLORIDE 0.9% FLUSH: 3.0000 mL | Freq: Two times a day (BID) | INTRAVENOUS | Status: DC

## 2020-07-30 MED ORDER — ACETAMINOPHEN 325 MG PO TABS: 650.0000 mg | ORAL_TABLET | ORAL | Status: DC | PRN

## 2020-07-30 MED ORDER — HEPARIN SODIUM (PORCINE) 1000 UNIT/ML IJ SOLN
INTRAMUSCULAR | Status: DC | PRN
  Administered 2020-07-30: 4500 [IU] via INTRAVENOUS

## 2020-07-30 MED ORDER — METOPROLOL TARTRATE 5 MG/5ML IV SOLN: 10.0000 mg | Freq: Once | INTRAVENOUS | Status: AC

## 2020-07-30 MED ORDER — VERAPAMIL HCL 2.5 MG/ML IV SOLN
INTRAVENOUS | Status: AC
  Filled 2020-07-30: qty 2

## 2020-07-30 MED ORDER — SODIUM CHLORIDE 0.9 % IV SOLN: 250.0000 mL | INTRAVENOUS | Status: DC | PRN

## 2020-07-30 MED ORDER — ONDANSETRON HCL 4 MG/2ML IJ SOLN: 4.0000 mg | Freq: Four times a day (QID) | INTRAMUSCULAR | Status: DC | PRN

## 2020-07-30 MED ORDER — LIDOCAINE HCL (PF) 1 % IJ SOLN
INTRAMUSCULAR | Status: AC
  Filled 2020-07-30: qty 30

## 2020-07-30 MED ORDER — MIDAZOLAM HCL 2 MG/2ML IJ SOLN
INTRAMUSCULAR | Status: DC | PRN
  Administered 2020-07-30: 1 mg via INTRAVENOUS

## 2020-07-30 MED ORDER — MIDAZOLAM HCL 2 MG/2ML IJ SOLN
INTRAMUSCULAR | Status: AC
  Filled 2020-07-30: qty 2

## 2020-07-30 MED ORDER — SODIUM CHLORIDE 0.9% FLUSH: 3.0000 mL | INTRAVENOUS | Status: DC | PRN

## 2020-07-30 MED ORDER — LIDOCAINE HCL (PF) 1 % IJ SOLN
INTRAMUSCULAR | Status: DC | PRN
  Administered 2020-07-30: 4 mL

## 2020-07-30 MED ORDER — HEPARIN SODIUM (PORCINE) 1000 UNIT/ML IJ SOLN
INTRAMUSCULAR | Status: AC
  Filled 2020-07-30: qty 1

## 2020-07-30 MED ORDER — SODIUM CHLORIDE 0.9 % IV SOLN: INTRAVENOUS | Status: DC

## 2020-07-30 MED ORDER — FENTANYL CITRATE (PF) 100 MCG/2ML IJ SOLN
INTRAMUSCULAR | Status: AC
  Filled 2020-07-30: qty 2

## 2020-07-30 SURGICAL SUPPLY — 13 items
CATH BALLN WEDGE 5F 110CM (CATHETERS) ×2 IMPLANT
CATH INFINITI 5FR JK (CATHETERS) ×2 IMPLANT
CATH INFINITI 5FR JR4 125CM (CATHETERS) ×2 IMPLANT
DEVICE RAD TR BAND REGULAR (VASCULAR PRODUCTS) ×2 IMPLANT
DRAPE BRACHIAL (DRAPES) ×4 IMPLANT
GLIDESHEATH SLEND SS 6F .021 (SHEATH) ×2 IMPLANT
GUIDEWIRE INQWIRE 1.5J.035X260 (WIRE) ×1 IMPLANT
INQWIRE 1.5J .035X260CM (WIRE) ×2
PACK CARDIAC CATH (CUSTOM PROCEDURE TRAY) ×2 IMPLANT
PROTECTION STATION PRESSURIZED (MISCELLANEOUS) ×2
SET ATX SIMPLICITY (MISCELLANEOUS) ×2 IMPLANT
SHEATH GLIDE SLENDER 4/5FR (SHEATH) ×2 IMPLANT
STATION PROTECTION PRESSURIZED (MISCELLANEOUS) ×1 IMPLANT

## 2020-07-30 NOTE — Progress Notes (Signed)
Pt. Adamant that MD not use groin today, stating "I had a real bad experience before when they used my groin." Pt. States "it's ok to use my arm." Pt. Wants to wait to sign consent until he speaks with MD. Kennon Holter MD and made aware. MD wrote he will speak with pt. Before procedure.

## 2020-07-30 NOTE — Interval H&P Note (Signed)
Cath Lab Visit (complete for each Cath Lab visit)  Clinical Evaluation Leading to the Procedure:   ACS: No.  Non-ACS:    Anginal Classification: CCS IV  Anti-ischemic medical therapy: Maximal Therapy (2 or more classes of medications)  Non-Invasive Test Results: No non-invasive testing performed  Prior CABG: No previous CABG   CHF: Yes. NYHA class 3    History and Physical Interval Note:  07/30/2020 10:25 AM  Jonathon Rios  has presented today for surgery, with the diagnosis of RT LT Heart Cath poss PCI   Unstable angina  Acute on chronic systolic HF.  The various methods of treatment have been discussed with the patient and family. After consideration of risks, benefits and other options for treatment, the patient has consented to  Procedure(s): RIGHT/LEFT HEART CATH AND CORONARY ANGIOGRAPHY poss PCI (N/A) as a surgical intervention.  The patient's history has been reviewed, patient examined, no change in status, stable for surgery.  I have reviewed the patient's chart and labs.  Questions were answered to the patient's satisfaction.     Jonathon Rios

## 2020-08-07 ENCOUNTER — Encounter: Payer: Self-pay | Admitting: Family

## 2020-08-10 ENCOUNTER — Ambulatory Visit: Payer: Medicare Other | Admitting: Family

## 2020-08-13 ENCOUNTER — Encounter: Payer: Self-pay | Admitting: Family

## 2020-08-13 ENCOUNTER — Other Ambulatory Visit: Payer: Self-pay

## 2020-08-13 ENCOUNTER — Ambulatory Visit (INDEPENDENT_AMBULATORY_CARE_PROVIDER_SITE_OTHER): Payer: Medicare Other | Admitting: Family

## 2020-08-13 VITALS — BP 132/80 | HR 73 | Temp 98.1°F | Ht 74.0 in | Wt 181.2 lb

## 2020-08-13 DIAGNOSIS — Z125 Encounter for screening for malignant neoplasm of prostate: Secondary | ICD-10-CM

## 2020-08-13 DIAGNOSIS — I5022 Chronic systolic (congestive) heart failure: Secondary | ICD-10-CM

## 2020-08-13 DIAGNOSIS — I10 Essential (primary) hypertension: Secondary | ICD-10-CM | POA: Diagnosis not present

## 2020-08-13 LAB — COMPREHENSIVE METABOLIC PANEL
ALT: 7 U/L (ref 0–53)
AST: 10 U/L (ref 0–37)
Albumin: 4.3 g/dL (ref 3.5–5.2)
Alkaline Phosphatase: 63 U/L (ref 39–117)
BUN: 30 mg/dL — ABNORMAL HIGH (ref 6–23)
CO2: 32 mEq/L (ref 19–32)
Calcium: 9.8 mg/dL (ref 8.4–10.5)
Chloride: 103 mEq/L (ref 96–112)
Creatinine, Ser: 1.82 mg/dL — ABNORMAL HIGH (ref 0.40–1.50)
GFR: 37.88 mL/min — ABNORMAL LOW (ref >60.00)
Glucose, Bld: 88 mg/dL (ref 70–99)
Potassium: 4.2 mEq/L (ref 3.5–5.1)
Sodium: 142 mEq/L (ref 135–145)
Total Bilirubin: 0.6 mg/dL (ref 0.2–1.2)
Total Protein: 8.1 g/dL (ref 6.0–8.3)

## 2020-08-13 LAB — HEMOGLOBIN A1C: Hgb A1c MFr Bld: 5.9 % (ref 4.6–6.5)

## 2020-08-13 LAB — PSA: PSA: 2.52 ng/mL (ref 0.10–4.00)

## 2020-08-13 NOTE — Assessment & Plan Note (Addendum)
Symptomatically stable. Continue entresto 97-103, lasix 20mg  qd, coreg 25mg . Pending a1c and consideration for jardiance. Will consult with [name] , Dr Fletcher Anon regarding.
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
## 2020-08-13 NOTE — Patient Instructions (Signed)
Nice to see you!   

## 2020-08-13 NOTE — Progress Notes (Signed)
Subjective:    Patient ID: Jonathon Rios, male    DOB: [birth year], 70 y.o.   MRN: [id number]  CC: Jonathon Rios is a 70 y.o. male who presents today for follow up.   HPI: Feels well today  No complaints  Breathing better and no recurrence of CP.No orthopnea, leg swelling.   He started lasix 20mg  and weight is down 7-8 pounds.   HLD- compliant with crestor 10mg   CHF- compliant with entresto 97-103, lasix 20mg  qd, coreg 25mg   Recent heart cath with Jonathon Rios 07/30/20 due to unstable angina.  No obstructive CAD.   Consider adding spironlactone and an SGLT2 inhibitor.   A1c 5.7 ,7 months ago   CKD- he is not following with Jonathon Jonathon Rios  No trouble urinating, hesitancy     HISTORY:  Past Medical History:  Diagnosis Date   Alcoholism and drug addiction in family    Arthritis    CHF (congestive heart failure) ([place])    Coronary artery disease    Heart disease    Hypertension    Irregular heart beat    Positive TB test    Seizure [facility])    witnessed by family in restaurant   Past Surgical History:  Procedure Laterality Date   CARDIAC CATHETERIZATION     RIGHT/LEFT HEART CATH AND CORONARY ANGIOGRAPHY N/A 07/30/2020   Procedure: RIGHT/LEFT HEART CATH AND CORONARY ANGIOGRAPHY poss PCI;  Surgeon: Wellington Hampshire, MD;  Location: [facility];  Service: Cardiovascular;  Laterality: N/A;   TONSILLECTOMY     Family History  Problem Relation Age of Onset   Hypertension Mother    Diabetes Mother    Alcohol abuse Mother    Hyperlipidemia Mother    Hypertension Sister    Alcohol abuse Sister    Asthma Sister    Depression Sister    Drug abuse Sister    Alcohol abuse Father    Hypertension Father    Hypertension Daughter    Early death Daughter    Drug abuse Daughter    Depression Daughter    Alcohol abuse Daughter     Allergies: Penicillins Current Outpatient Medications on File Prior to Visit  Medication Sig Dispense Refill   albuterol (PROAIR HFA) 108  (90 Base) MCG/ACT inhaler Inhale 2 puffs into the lungs every 6 (six) hours as needed for wheezing or shortness of breath. 1 each 0   aspirin EC 81 MG tablet Take 1 tablet (81 mg total) by mouth daily.     budesonide-formoterol (SYMBICORT) 160-4.5 MCG/ACT inhaler TAKE 2 PUFFS BY MOUTH TWICE A DAY 1 each 0   carvedilol (COREG) 25 MG tablet Take 1 tablet (25 mg total) by mouth 2 (two) times daily with a meal. 180 tablet 3   clotrimazole (LOTRIMIN) 1 % cream APPLY TO AFFECTED AREA TWICE A DAY 30 g 2   ENTRESTO 97-103 MG TAKE 1 TABLET BY MOUTH TWICE A DAY 60 tablet 6   furosemide (LASIX) 20 MG tablet Take 1 tablet (20 mg total) by mouth daily. 30 tablet 5   rosuvastatin (CRESTOR) 10 MG tablet Take 1 tablet (10 mg total) by mouth daily. 90 tablet 3   No current facility-administered medications on file prior to visit.    Social History   Tobacco Use   Smoking status: Some Days    Packs/day: 0.50    Years: 50.00    Pack years: 25.00    Types: Cigarettes    Last attempt to quit: 02/23/2017  Years since quitting: 3.4   Smokeless tobacco: Never  Vaping Use   Vaping Use: Never used  Substance Use Topics   Alcohol use: No   Drug use: Not Currently    Types: "Crack" cocaine, Heroin, Marijuana, LSD    Comment: Sober since 2018.     Review of Systems  Constitutional:  Negative for chills and fever.  Respiratory:  Negative for cough and shortness of breath.   Cardiovascular:  Negative for chest pain, palpitations and leg swelling.  Gastrointestinal:  Negative for nausea and vomiting.     Objective:    BP 132/80 (BP Location: Left Arm, Patient Position: Sitting, Cuff Size: Large)   Pulse 73   Temp 98.1 F (36.7 C) (Oral)   Ht 6\' 2"  (1.88 m)   Wt 181 lb 3.2 oz (82.2 kg)   SpO2 98%   BMI 23.26 kg/m  BP Readings from Last 3 Encounters:  08/13/20 132/80  07/30/20 (!) 142/86  07/24/20 130/90   Wt Readings from Last 3 Encounters:  08/13/20 181 lb 3.2 oz (82.2 kg)  07/30/20 188 lb  (85.3 kg)  07/24/20 188 lb (85.3 kg)    Physical Exam Vitals reviewed.  Constitutional:      Appearance: He is well-developed.  Cardiovascular:     Rate and Rhythm: Regular rhythm.     Heart sounds: Normal heart sounds.  Pulmonary:     Effort: Pulmonary effort is normal. No respiratory distress.     Breath sounds: Normal breath sounds. No wheezing, rhonchi or rales.  Musculoskeletal:     Right lower leg: No edema.     Left lower leg: No edema.  Skin:    General: Skin is warm and dry.  Neurological:     Mental Status: He is alert.  Psychiatric:        Speech: Speech normal.        Behavior: Behavior normal.       Assessment & Plan:   Problem List Items Addressed This Visit       Cardiovascular and Mediastinum   HTN (hypertension) - Primary (Chronic)   Relevant Orders   Comprehensive metabolic panel   Hemoglobin A1c   CHF (congestive heart failure) (HCC)    Symptomatically stable. Continue entresto 97-103, lasix 20mg  qd, coreg 25mg . Pending a1c and consideration for jardiance. Will consult with Jonathon Rios , Jonathon Rios regarding.        Other Visit Diagnoses     Screening for prostate cancer       Relevant Orders   PSA        I am having Jonathon Rios maintain his aspirin EC, clotrimazole, albuterol, budesonide-formoterol, rosuvastatin, carvedilol, Entresto, and furosemide.   No orders of the defined types were placed in this encounter.   Return precautions given.   Risks, benefits, and alternatives of the medications and treatment plan prescribed today were discussed, and patient expressed understanding.   Education regarding symptom management and diagnosis given to patient on AVS.  Continue to follow with Jonathon Hawthorne, FNP for routine health maintenance.   Jonathon Rios and I agreed with plan.   Jonathon Paris, FNP

## 2020-08-14 ENCOUNTER — Other Ambulatory Visit: Payer: Self-pay | Admitting: Family

## 2020-08-14 DIAGNOSIS — N189 Chronic kidney disease, unspecified: Secondary | ICD-10-CM

## 2020-08-18 ENCOUNTER — Telehealth: Payer: Self-pay

## 2020-08-18 NOTE — Telephone Encounter (Signed)
Patient called and notified of results.  °

## 2020-08-18 NOTE — Telephone Encounter (Signed)
Pt returned your call.  

## 2020-08-18 NOTE — Telephone Encounter (Signed)
LMTCB for lab results.  

## 2020-08-19 ENCOUNTER — Telehealth: Payer: Self-pay | Admitting: Family

## 2020-08-19 NOTE — Telephone Encounter (Signed)
Rejection Reason - Patient did not respond - Attempted to call multiple times, no response." Francee Nodal said on Aug 18, 2020 4:29 PM  Msg sent from [facility]

## 2020-08-20 ENCOUNTER — Encounter: Payer: Self-pay | Admitting: Physician Assistant

## 2020-08-20 ENCOUNTER — Ambulatory Visit (INDEPENDENT_AMBULATORY_CARE_PROVIDER_SITE_OTHER): Payer: Medicare Other | Admitting: Physician Assistant

## 2020-08-20 ENCOUNTER — Other Ambulatory Visit: Payer: Self-pay

## 2020-08-20 VITALS — BP 124/90 | HR 65 | Ht 75.0 in | Wt 180.5 lb

## 2020-08-20 DIAGNOSIS — Z720 Tobacco use: Secondary | ICD-10-CM

## 2020-08-20 DIAGNOSIS — I5022 Chronic systolic (congestive) heart failure: Secondary | ICD-10-CM

## 2020-08-20 DIAGNOSIS — N179 Acute kidney failure, unspecified: Secondary | ICD-10-CM

## 2020-08-20 DIAGNOSIS — I428 Other cardiomyopathies: Secondary | ICD-10-CM

## 2020-08-20 DIAGNOSIS — I2510 Atherosclerotic heart disease of native coronary artery without angina pectoris: Secondary | ICD-10-CM

## 2020-08-20 DIAGNOSIS — N183 Chronic kidney disease, stage 3 unspecified: Secondary | ICD-10-CM

## 2020-08-20 DIAGNOSIS — I493 Ventricular premature depolarization: Secondary | ICD-10-CM | POA: Diagnosis not present

## 2020-08-20 DIAGNOSIS — I110 Hypertensive heart disease with heart failure: Secondary | ICD-10-CM

## 2020-08-20 MED ORDER — FUROSEMIDE 20 MG PO TABS
20.0000 mg | ORAL_TABLET | Freq: Every day | ORAL | 5 refills | Status: DC | PRN
Start: 2020-08-20 — End: 2020-11-21

## 2020-08-20 NOTE — Patient Instructions (Signed)
Medication Instructions:  Your physician has recommended you make the following change in your medication:   Change Furosemide (Lasix) to as needed  *If you need a refill on your cardiac medications before your next appointment, please call your pharmacy*   Lab Work: BMET in one week at the [employer] at [facility] go to 1st desk on the right to check in, past the screening table Lab hours: Monday- Friday (7:30 am- 5:30 pm)  If you have labs (blood work) drawn today and your tests are completely normal, you will receive your results only by: [employer] (if you have MyChart) OR A paper copy in the mail If you have any lab test that is abnormal or we need to change your treatment, we will call you to review the results.   Testing/Procedures: None   Follow-Up: At [facility], you and your health needs are our priority.  As part of our continuing mission to provide you with exceptional heart care, we have created designated Provider Care Teams.  These Care Teams include your primary Cardiologist (physician) and Advanced Practice Providers (APPs -  Physician Assistants and Nurse Practitioners) who all work together to provide you with the care you need, when you need it.   Your next appointment:   2 month(s)  The format for your next appointment:   In Person  Provider:   [name], MD or [name], PA-C

## 2020-08-24 NOTE — Telephone Encounter (Signed)
noted 

## 2020-08-27 ENCOUNTER — Other Ambulatory Visit
Admission: RE | Admit: 2020-08-27 | Discharge: 2020-08-27 | Disposition: A | Discharge status: Home / Self Care | Payer: Medicare Other | Source: Ambulatory Visit | Attending: Physician Assistant | Admitting: Physician Assistant

## 2020-08-27 DIAGNOSIS — I5022 Chronic systolic (congestive) heart failure: Secondary | ICD-10-CM | POA: Diagnosis not present

## 2020-08-27 LAB — BASIC METABOLIC PANEL
Anion gap: 6 (ref 5–15)
BUN: 23 mg/dL (ref 8–23)
CO2: 28 mmol/L (ref 22–32)
Calcium: 9.2 mg/dL (ref 8.9–10.3)
Chloride: 110 mmol/L (ref 98–111)
Creatinine, Ser: 1.66 mg/dL — ABNORMAL HIGH (ref 0.61–1.24)
GFR, Estimated: 45 mL/min — ABNORMAL LOW (ref >60)
Glucose, Bld: 90 mg/dL (ref 70–99)
Potassium: 4.2 mmol/L (ref 3.5–5.1)
Sodium: 144 mmol/L (ref 135–145)

## 2020-09-01 ENCOUNTER — Telehealth: Payer: Self-pay | Admitting: *Deleted

## 2020-09-01 NOTE — Telephone Encounter (Signed)
-----   Message from [name], [place] sent at 08/28/2020 10:22 AM EDT ----- Following the transition of daily Lasix to as needed dosed Lasix, his renal function has improved and is now back to his approximate baseline.  He should continue Lasix on an as needed basis.

## 2020-09-01 NOTE — Telephone Encounter (Signed)
Left voicemail message to call back for review of results and recommendations.  

## 2020-09-02 NOTE — Telephone Encounter (Signed)
Reviewed results and recommendations with patient and he verbalized understanding with no further questions at this time.  

## 2020-10-08 ENCOUNTER — Encounter: Payer: Self-pay | Admitting: Internal Medicine

## 2020-10-08 ENCOUNTER — Emergency Department: Payer: Medicare Other

## 2020-10-08 ENCOUNTER — Inpatient Hospital Stay
Admission: EM | Admit: 2020-10-08 | Discharge: 2020-10-14 | DRG: 308 | Disposition: A | Discharge status: Other Acute Inpatient Hospital | Payer: Medicare Other | Attending: Hospitalist | Admitting: Hospitalist

## 2020-10-08 ENCOUNTER — Other Ambulatory Visit: Payer: Self-pay

## 2020-10-08 DIAGNOSIS — Z4659 Encounter for fitting and adjustment of other gastrointestinal appliance and device: Secondary | ICD-10-CM

## 2020-10-08 DIAGNOSIS — I472 Ventricular tachycardia: Secondary | ICD-10-CM | POA: Diagnosis not present

## 2020-10-08 DIAGNOSIS — I130 Hypertensive heart and chronic kidney disease with heart failure and stage 1 through stage 4 chronic kidney disease, or unspecified chronic kidney disease: Secondary | ICD-10-CM | POA: Diagnosis present

## 2020-10-08 DIAGNOSIS — R739 Hyperglycemia, unspecified: Secondary | ICD-10-CM | POA: Diagnosis present

## 2020-10-08 DIAGNOSIS — J9602 Acute respiratory failure with hypercapnia: Secondary | ICD-10-CM | POA: Diagnosis present

## 2020-10-08 DIAGNOSIS — I2510 Atherosclerotic heart disease of native coronary artery without angina pectoris: Secondary | ICD-10-CM | POA: Diagnosis present

## 2020-10-08 DIAGNOSIS — G931 Anoxic brain damage, not elsewhere classified: Secondary | ICD-10-CM | POA: Diagnosis not present

## 2020-10-08 DIAGNOSIS — Z452 Encounter for adjustment and management of vascular access device: Secondary | ICD-10-CM | POA: Diagnosis not present

## 2020-10-08 DIAGNOSIS — M1990 Unspecified osteoarthritis, unspecified site: Secondary | ICD-10-CM | POA: Diagnosis not present

## 2020-10-08 DIAGNOSIS — Z4682 Encounter for fitting and adjustment of non-vascular catheter: Secondary | ICD-10-CM | POA: Diagnosis not present

## 2020-10-08 DIAGNOSIS — R570 Cardiogenic shock: Secondary | ICD-10-CM | POA: Diagnosis present

## 2020-10-08 DIAGNOSIS — I4901 Ventricular fibrillation: Secondary | ICD-10-CM | POA: Diagnosis not present

## 2020-10-08 DIAGNOSIS — I428 Other cardiomyopathies: Secondary | ICD-10-CM | POA: Diagnosis present

## 2020-10-08 DIAGNOSIS — I77810 Thoracic aortic ectasia: Secondary | ICD-10-CM | POA: Diagnosis present

## 2020-10-08 DIAGNOSIS — Z7982 Long term (current) use of aspirin: Secondary | ICD-10-CM

## 2020-10-08 DIAGNOSIS — Z20822 Contact with and (suspected) exposure to covid-19: Secondary | ICD-10-CM | POA: Diagnosis not present

## 2020-10-08 DIAGNOSIS — I462 Cardiac arrest due to underlying cardiac condition: Secondary | ICD-10-CM | POA: Diagnosis not present

## 2020-10-08 DIAGNOSIS — E440 Moderate protein-calorie malnutrition: Secondary | ICD-10-CM | POA: Diagnosis present

## 2020-10-08 DIAGNOSIS — Z7951 Long term (current) use of inhaled steroids: Secondary | ICD-10-CM

## 2020-10-08 DIAGNOSIS — I5021 Acute systolic (congestive) heart failure: Secondary | ICD-10-CM | POA: Diagnosis not present

## 2020-10-08 DIAGNOSIS — N179 Acute kidney failure, unspecified: Secondary | ICD-10-CM | POA: Diagnosis not present

## 2020-10-08 DIAGNOSIS — R404 Transient alteration of awareness: Secondary | ICD-10-CM | POA: Diagnosis not present

## 2020-10-08 DIAGNOSIS — J441 Chronic obstructive pulmonary disease with (acute) exacerbation: Secondary | ICD-10-CM | POA: Diagnosis not present

## 2020-10-08 DIAGNOSIS — Z825 Family history of asthma and other chronic lower respiratory diseases: Secondary | ICD-10-CM

## 2020-10-08 DIAGNOSIS — Z66 Do not resuscitate: Secondary | ICD-10-CM | POA: Diagnosis not present

## 2020-10-08 DIAGNOSIS — Z978 Presence of other specified devices: Secondary | ICD-10-CM

## 2020-10-08 DIAGNOSIS — I272 Pulmonary hypertension, unspecified: Secondary | ICD-10-CM | POA: Diagnosis not present

## 2020-10-08 DIAGNOSIS — G9341 Metabolic encephalopathy: Secondary | ICD-10-CM | POA: Diagnosis present

## 2020-10-08 DIAGNOSIS — Z880 Allergy status to penicillin: Secondary | ICD-10-CM

## 2020-10-08 DIAGNOSIS — J9600 Acute respiratory failure, unspecified whether with hypoxia or hypercapnia: Secondary | ICD-10-CM | POA: Diagnosis not present

## 2020-10-08 DIAGNOSIS — J9690 Respiratory failure, unspecified, unspecified whether with hypoxia or hypercapnia: Secondary | ICD-10-CM | POA: Diagnosis not present

## 2020-10-08 DIAGNOSIS — I248 Other forms of acute ischemic heart disease: Secondary | ICD-10-CM | POA: Diagnosis present

## 2020-10-08 DIAGNOSIS — I469 Cardiac arrest, cause unspecified: Secondary | ICD-10-CM

## 2020-10-08 DIAGNOSIS — I5022 Chronic systolic (congestive) heart failure: Secondary | ICD-10-CM | POA: Diagnosis not present

## 2020-10-08 DIAGNOSIS — Z833 Family history of diabetes mellitus: Secondary | ICD-10-CM

## 2020-10-08 DIAGNOSIS — R1110 Vomiting, unspecified: Secondary | ICD-10-CM | POA: Diagnosis not present

## 2020-10-08 DIAGNOSIS — N189 Chronic kidney disease, unspecified: Secondary | ICD-10-CM

## 2020-10-08 DIAGNOSIS — Z743 Need for continuous supervision: Secondary | ICD-10-CM | POA: Diagnosis not present

## 2020-10-08 DIAGNOSIS — M47816 Spondylosis without myelopathy or radiculopathy, lumbar region: Secondary | ICD-10-CM | POA: Diagnosis not present

## 2020-10-08 DIAGNOSIS — I5043 Acute on chronic combined systolic (congestive) and diastolic (congestive) heart failure: Secondary | ICD-10-CM | POA: Diagnosis not present

## 2020-10-08 DIAGNOSIS — R464 Slowness and poor responsiveness: Secondary | ICD-10-CM | POA: Diagnosis not present

## 2020-10-08 DIAGNOSIS — J9601 Acute respiratory failure with hypoxia: Secondary | ICD-10-CM | POA: Diagnosis present

## 2020-10-08 DIAGNOSIS — F17210 Nicotine dependence, cigarettes, uncomplicated: Secondary | ICD-10-CM | POA: Diagnosis present

## 2020-10-08 DIAGNOSIS — Z79899 Other long term (current) drug therapy: Secondary | ICD-10-CM

## 2020-10-08 DIAGNOSIS — E162 Hypoglycemia, unspecified: Secondary | ICD-10-CM | POA: Diagnosis present

## 2020-10-08 DIAGNOSIS — Z8349 Family history of other endocrine, nutritional and metabolic diseases: Secondary | ICD-10-CM

## 2020-10-08 DIAGNOSIS — J9811 Atelectasis: Secondary | ICD-10-CM | POA: Diagnosis not present

## 2020-10-08 DIAGNOSIS — I517 Cardiomegaly: Secondary | ICD-10-CM | POA: Diagnosis not present

## 2020-10-08 DIAGNOSIS — R569 Unspecified convulsions: Secondary | ICD-10-CM | POA: Diagnosis not present

## 2020-10-08 DIAGNOSIS — I499 Cardiac arrhythmia, unspecified: Secondary | ICD-10-CM | POA: Diagnosis not present

## 2020-10-08 DIAGNOSIS — Z8616 Personal history of COVID-19: Secondary | ICD-10-CM

## 2020-10-08 DIAGNOSIS — D631 Anemia in chronic kidney disease: Secondary | ICD-10-CM | POA: Diagnosis present

## 2020-10-08 DIAGNOSIS — R6889 Other general symptoms and signs: Secondary | ICD-10-CM | POA: Diagnosis not present

## 2020-10-08 DIAGNOSIS — N182 Chronic kidney disease, stage 2 (mild): Secondary | ICD-10-CM | POA: Diagnosis present

## 2020-10-08 DIAGNOSIS — Z8249 Family history of ischemic heart disease and other diseases of the circulatory system: Secondary | ICD-10-CM

## 2020-10-08 DIAGNOSIS — I10 Essential (primary) hypertension: Secondary | ICD-10-CM | POA: Diagnosis not present

## 2020-10-08 LAB — URINALYSIS, COMPLETE (UACMP) WITH MICROSCOPIC
Bilirubin Urine: NEGATIVE
Glucose, UA: NEGATIVE mg/dL
Ketones, ur: NEGATIVE mg/dL
Nitrite: NEGATIVE
Protein, ur: 100 mg/dL — AB
RBC / HPF: >50 RBC/hpf — ABNORMAL HIGH (ref 0–5)
Specific Gravity, Urine: 1.016 (ref 1.005–1.030)
pH: 5 (ref 5.0–8.0)

## 2020-10-08 LAB — BLOOD GAS, ARTERIAL
Acid-base deficit: 2.3 mmol/L — ABNORMAL HIGH (ref 0.0–2.0)
Bicarbonate: 24.2 mmol/L (ref 20.0–28.0)
FIO2: 0.6
MECHVT: 550 mL
O2 Saturation: 99.5 %
PEEP: 5 cmH2O
Patient temperature: 37
RATE: 16 resp/min
pCO2 arterial: 47 mmHg (ref 32.0–48.0)
pH, Arterial: 7.32 — ABNORMAL LOW (ref 7.350–7.450)
pO2, Arterial: 176 mmHg — ABNORMAL HIGH (ref 83.0–108.0)

## 2020-10-08 LAB — URINE DRUG SCREEN, QUALITATIVE (ARMC ONLY)
Amphetamines, Ur Screen: NOT DETECTED
Barbiturates, Ur Screen: NOT DETECTED
Benzodiazepine, Ur Scrn: POSITIVE — AB
Cannabinoid 50 Ng, Ur ~~LOC~~: NOT DETECTED
Cocaine Metabolite,Ur ~~LOC~~: NOT DETECTED
MDMA (Ecstasy)Ur Screen: NOT DETECTED
Methadone Scn, Ur: NOT DETECTED
Opiate, Ur Screen: NOT DETECTED
Phencyclidine (PCP) Ur S: NOT DETECTED
Tricyclic, Ur Screen: NOT DETECTED

## 2020-10-08 LAB — COMPREHENSIVE METABOLIC PANEL
ALT: 37 U/L (ref 0–44)
AST: 50 U/L — ABNORMAL HIGH (ref 15–41)
Albumin: 3.6 g/dL (ref 3.5–5.0)
Alkaline Phosphatase: 55 U/L (ref 38–126)
Anion gap: 11 (ref 5–15)
BUN: 24 mg/dL — ABNORMAL HIGH (ref 8–23)
CO2: 22 mmol/L (ref 22–32)
Calcium: 8.5 mg/dL — ABNORMAL LOW (ref 8.9–10.3)
Chloride: 104 mmol/L (ref 98–111)
Creatinine, Ser: 1.8 mg/dL — ABNORMAL HIGH (ref 0.61–1.24)
GFR, Estimated: 41 mL/min — ABNORMAL LOW (ref >60)
Glucose, Bld: 164 mg/dL — ABNORMAL HIGH (ref 70–99)
Potassium: 4.1 mmol/L (ref 3.5–5.1)
Sodium: 137 mmol/L (ref 135–145)
Total Bilirubin: 0.9 mg/dL (ref 0.3–1.2)
Total Protein: 7.5 g/dL (ref 6.5–8.1)

## 2020-10-08 LAB — CBC WITH DIFFERENTIAL/PLATELET
Abs Immature Granulocytes: 0.01 10*3/uL (ref 0.00–0.07)
Basophils Absolute: 0.1 10*3/uL (ref 0.0–0.1)
Basophils Relative: 1 %
Eosinophils Absolute: 0.2 10*3/uL (ref 0.0–0.5)
Eosinophils Relative: 3 %
HCT: 43.9 % (ref 39.0–52.0)
Hemoglobin: 14.9 g/dL (ref 13.0–17.0)
Immature Granulocytes: 0 %
Lymphocytes Relative: 58 %
Lymphs Abs: 3.6 10*3/uL (ref 0.7–4.0)
MCH: 30.7 pg (ref 26.0–34.0)
MCHC: 33.9 g/dL (ref 30.0–36.0)
MCV: 90.3 fL (ref 80.0–100.0)
Monocytes Absolute: 0.3 10*3/uL (ref 0.1–1.0)
Monocytes Relative: 5 %
Neutro Abs: 2 10*3/uL (ref 1.7–7.7)
Neutrophils Relative %: 33 %
Platelets: 130 10*3/uL — ABNORMAL LOW (ref 150–400)
RBC: 4.86 MIL/uL (ref 4.22–5.81)
RDW: 14.2 % (ref 11.5–15.5)
WBC: 6.1 10*3/uL (ref 4.0–10.5)
nRBC: 0 % (ref 0.0–0.2)

## 2020-10-08 LAB — RESP PANEL BY RT-PCR (FLU A&B, COVID) ARPGX2
Influenza A by PCR: NEGATIVE
Influenza B by PCR: NEGATIVE
SARS Coronavirus 2 by RT PCR: NEGATIVE

## 2020-10-08 LAB — LACTIC ACID, PLASMA: Lactic Acid, Venous: 1.7 mmol/L (ref 0.5–1.9)

## 2020-10-08 LAB — TROPONIN I (HIGH SENSITIVITY)
Troponin I (High Sensitivity): 18 ng/L — ABNORMAL HIGH (ref <18)
Troponin I (High Sensitivity): 199 ng/L (ref <18)

## 2020-10-08 LAB — HEMOGLOBIN A1C
Hgb A1c MFr Bld: 5.5 % (ref 4.8–5.6)
Mean Plasma Glucose: 111.15 mg/dL

## 2020-10-08 LAB — MRSA NEXT GEN BY PCR, NASAL: MRSA by PCR Next Gen: NOT DETECTED

## 2020-10-08 LAB — PROCALCITONIN: Procalcitonin: <0.1 ng/mL

## 2020-10-08 LAB — ACETAMINOPHEN LEVEL: Acetaminophen (Tylenol), Serum: <10 ug/mL — ABNORMAL LOW (ref 10–30)

## 2020-10-08 LAB — GLUCOSE, CAPILLARY: Glucose-Capillary: 70 mg/dL (ref 70–99)

## 2020-10-08 LAB — PROTIME-INR
INR: 1.3 — ABNORMAL HIGH (ref 0.8–1.2)
Prothrombin Time: 16.2 seconds — ABNORMAL HIGH (ref 11.4–15.2)

## 2020-10-08 LAB — CBG MONITORING, ED: Glucose-Capillary: 128 mg/dL — ABNORMAL HIGH (ref 70–99)

## 2020-10-08 LAB — BRAIN NATRIURETIC PEPTIDE: B Natriuretic Peptide: 1017.7 pg/mL — ABNORMAL HIGH (ref 0.0–100.0)

## 2020-10-08 LAB — ETHANOL: Alcohol, Ethyl (B): <10 mg/dL (ref <10)

## 2020-10-08 LAB — APTT: aPTT: 30 seconds (ref 24–36)

## 2020-10-08 MED ORDER — CHLORHEXIDINE GLUCONATE CLOTH 2 % EX PADS
6.0000 | MEDICATED_PAD | Freq: Every day | CUTANEOUS | Status: DC
  Administered 2020-10-08 – 2020-10-14 (×3): 6 via TOPICAL

## 2020-10-08 MED ORDER — FENTANYL 2500MCG IN NS 250ML (10MCG/ML) PREMIX INFUSION
0.0000 ug/h | INTRAVENOUS | Status: DC
  Administered 2020-10-08: 100 ug/h via INTRAVENOUS
  Administered 2020-10-09: 300 ug/h via INTRAVENOUS
  Administered 2020-10-09: 200 ug/h via INTRAVENOUS
  Administered 2020-10-09: 300 ug/h via INTRAVENOUS
  Administered 2020-10-10: 150 ug/h via INTRAVENOUS
  Filled 2020-10-08 (×5): qty 250

## 2020-10-08 MED ORDER — DOCUSATE SODIUM 50 MG/5ML PO LIQD
100.0000 mg | Freq: Two times a day (BID) | ORAL | Status: DC
  Administered 2020-10-08 – 2020-10-12 (×5): 100 mg
  Filled 2020-10-08 (×7): qty 10

## 2020-10-08 MED ORDER — CHLORHEXIDINE GLUCONATE 0.12% ORAL RINSE (MEDLINE KIT)
15.0000 mL | Freq: Two times a day (BID) | OROMUCOSAL | Status: DC
  Administered 2020-10-08 – 2020-10-14 (×11): 15 mL via OROMUCOSAL

## 2020-10-08 MED ORDER — DEXTROSE 50 % IV SOLN
INTRAVENOUS | Status: AC
  Administered 2020-10-08: 12.5 g via INTRAVENOUS
  Filled 2020-10-08: qty 50

## 2020-10-08 MED ORDER — INSULIN ASPART 100 UNIT/ML IJ SOLN: 0.0000 [IU] | INTRAMUSCULAR | Status: DC

## 2020-10-08 MED ORDER — LEVETIRACETAM IN NACL 500 MG/100ML IV SOLN
500.0000 mg | Freq: Two times a day (BID) | INTRAVENOUS | Status: DC
  Administered 2020-10-09 – 2020-10-14 (×11): 500 mg via INTRAVENOUS
  Filled 2020-10-08 (×13): qty 100

## 2020-10-08 MED ORDER — ASPIRIN 300 MG RE SUPP
300.0000 mg | RECTAL | Status: AC
  Administered 2020-10-08: 300 mg via RECTAL
  Filled 2020-10-08: qty 1

## 2020-10-08 MED ORDER — POLYETHYLENE GLYCOL 3350 17 G PO PACK
17.0000 g | PACK | Freq: Every day | ORAL | Status: DC
  Administered 2020-10-09 – 2020-10-10 (×2): 17 g
  Filled 2020-10-08 (×2): qty 1

## 2020-10-08 MED ORDER — ASPIRIN 81 MG PO CHEW
81.0000 mg | CHEWABLE_TABLET | Freq: Every day | ORAL | Status: DC
  Administered 2020-10-09 – 2020-10-14 (×3): 81 mg
  Filled 2020-10-08 (×3): qty 1

## 2020-10-08 MED ORDER — ETOMIDATE 2 MG/ML IV SOLN
INTRAVENOUS | Status: AC | PRN
  Administered 2020-10-08: 20 mg via INTRAVENOUS

## 2020-10-08 MED ORDER — DEXTROSE 50 % IV SOLN: 12.5000 g | INTRAVENOUS | Status: AC

## 2020-10-08 MED ORDER — HEPARIN SODIUM (PORCINE) 5000 UNIT/ML IJ SOLN
5000.0000 [IU] | Freq: Three times a day (TID) | INTRAMUSCULAR | Status: DC
  Administered 2020-10-08 (×2): 5000 [IU] via SUBCUTANEOUS
  Filled 2020-10-08 (×2): qty 1

## 2020-10-08 MED ORDER — PROPOFOL 1000 MG/100ML IV EMUL: 5.0000 ug/kg/min | INTRAVENOUS | Status: DC

## 2020-10-08 MED ORDER — PROPOFOL 1000 MG/100ML IV EMUL
INTRAVENOUS | Status: AC
  Filled 2020-10-08: qty 100

## 2020-10-08 MED ORDER — PROPOFOL 1000 MG/100ML IV EMUL
5.0000 ug/kg/min | INTRAVENOUS | Status: DC
  Administered 2020-10-08: 10 ug/kg/min via INTRAVENOUS
  Administered 2020-10-09: 40 ug/kg/min via INTRAVENOUS
  Administered 2020-10-10: 20 ug/kg/min via INTRAVENOUS
  Administered 2020-10-11: 10 ug/kg/min via INTRAVENOUS
  Administered 2020-10-12: 20 ug/kg/min via INTRAVENOUS
  Administered 2020-10-12: 30 ug/kg/min via INTRAVENOUS
  Administered 2020-10-12: 25 ug/kg/min via INTRAVENOUS
  Administered 2020-10-12 – 2020-10-13 (×2): 30 ug/kg/min via INTRAVENOUS
  Filled 2020-10-08 (×5): qty 100
  Filled 2020-10-08: qty 200
  Filled 2020-10-08 (×2): qty 100

## 2020-10-08 MED ORDER — FENTANYL BOLUS VIA INFUSION
25.0000 ug | INTRAVENOUS | Status: DC | PRN
  Administered 2020-10-08 – 2020-10-09 (×2): 25 ug via INTRAVENOUS
  Filled 2020-10-08: qty 25

## 2020-10-08 MED ORDER — MIDAZOLAM 50MG/50ML (1MG/ML) PREMIX INFUSION
0.0000 mg/h | INTRAVENOUS | Status: DC
  Administered 2020-10-08: 6 mg/h via INTRAVENOUS
  Administered 2020-10-08: 2 mg/h via INTRAVENOUS
  Administered 2020-10-09 (×3): 10 mg/h via INTRAVENOUS
  Administered 2020-10-09: 5 mg/h via INTRAVENOUS
  Filled 2020-10-08 (×6): qty 50

## 2020-10-08 MED ORDER — LEVETIRACETAM IN NACL 1000 MG/100ML IV SOLN
1000.0000 mg | Freq: Once | INTRAVENOUS | Status: AC
  Administered 2020-10-08: 1000 mg via INTRAVENOUS
  Filled 2020-10-08: qty 100

## 2020-10-08 MED ORDER — FAMOTIDINE IN NACL 20-0.9 MG/50ML-% IV SOLN
20.0000 mg | Freq: Two times a day (BID) | INTRAVENOUS | Status: DC
  Administered 2020-10-08 – 2020-10-10 (×4): 20 mg via INTRAVENOUS
  Filled 2020-10-08 (×4): qty 50

## 2020-10-08 MED ORDER — ORAL CARE MOUTH RINSE
15.0000 mL | OROMUCOSAL | Status: DC
  Administered 2020-10-08 – 2020-10-14 (×48): 15 mL via OROMUCOSAL

## 2020-10-08 MED ORDER — MIDAZOLAM BOLUS VIA INFUSION
0.0000 mg | INTRAVENOUS | Status: DC | PRN
  Administered 2020-10-08: 2 mg via INTRAVENOUS
  Filled 2020-10-08: qty 5

## 2020-10-08 MED ORDER — SUCCINYLCHOLINE CHLORIDE 20 MG/ML IJ SOLN
INTRAMUSCULAR | Status: AC | PRN
  Administered 2020-10-08: 100 mg via INTRAVENOUS

## 2020-10-08 MED ORDER — ACETAMINOPHEN 325 MG PO TABS
650.0000 mg | ORAL_TABLET | Freq: Four times a day (QID) | ORAL | Status: DC | PRN
  Administered 2020-10-08 – 2020-10-10 (×3): 650 mg via ORAL
  Filled 2020-10-08 (×3): qty 2

## 2020-10-08 MED ORDER — SODIUM CHLORIDE 0.9 % IV SOLN: INTRAVENOUS | Status: DC
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
## 2020-10-08 NOTE — Progress Notes (Signed)
GOALS OF CARE DISCUSSION  The Clinical status was relayed to family in detail. Daughter at Bedside Updated and notified of patients medical condition.    Patient remains unresponsive and will not open eyes to command.   Patient is having a weak cough and struggling to remove secretions.   Patient with increased WOB and using accessory muscles to breathe Explained to family course of therapy and the modalities    Patient with Progressive multiorgan failure with a very high probablity of a very minimal chance of meaningful recovery despite all aggressive and optimal medical therapy.   Daughter understands the situation. She has  consented and agreed to DNR status  Family are satisfied with Plan of action and management. All questions answered  Additional CC time 35 mins   Prisila Dlouhy Patricia Pesa, M.D.  [facility]  Medical Director [place] Director [facility] Cardio-Pulmonary Department

## 2020-10-08 NOTE — ED Notes (Signed)
Kelly RN aware of assigned bed 

## 2020-10-08 NOTE — ED Notes (Signed)
Pt tolerating fentanyl drip. BP maintaining. MD messaged in regard to holding Versed at this time.

## 2020-10-08 NOTE — Progress Notes (Signed)
Patient arrived from the ED via stretcher, accompanied by nursing staff. Fentanyl infusing at 120mg/hr and Versed at '2mg'$ /hr. Patient coughing over vent. Baseline twitch/myoclonic jerking observed, very slight. Corneal reflexes intact, cough/gag present. Patient appears to have some decorticate posturing or possibly localizing pain. VSS. Sedation increased to achieve RASS goal. Daughter at bedside. Updates given.
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
## 2020-10-08 NOTE — ED Notes (Signed)
Verbal orders to obtain ABG received from NP Ouma and advance OG tube by 5cm
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
## 2020-10-08 NOTE — ED Notes (Signed)
Family at bedside. 

## 2020-10-09 ENCOUNTER — Inpatient Hospital Stay: Payer: Medicare Other

## 2020-10-09 ENCOUNTER — Other Ambulatory Visit: Payer: Self-pay

## 2020-10-09 ENCOUNTER — Inpatient Hospital Stay (HOSPITAL_COMMUNITY)
Admit: 2020-10-09 | Discharge: 2020-10-09 | Disposition: A | Discharge status: Home / Self Care | Payer: Medicare Other | Attending: Pulmonary Disease | Admitting: Pulmonary Disease

## 2020-10-09 DIAGNOSIS — R569 Unspecified convulsions: Secondary | ICD-10-CM

## 2020-10-09 DIAGNOSIS — I469 Cardiac arrest, cause unspecified: Secondary | ICD-10-CM | POA: Diagnosis not present

## 2020-10-09 DIAGNOSIS — E162 Hypoglycemia, unspecified: Secondary | ICD-10-CM

## 2020-10-09 DIAGNOSIS — J9601 Acute respiratory failure with hypoxia: Secondary | ICD-10-CM

## 2020-10-09 DIAGNOSIS — E440 Moderate protein-calorie malnutrition: Secondary | ICD-10-CM | POA: Insufficient documentation

## 2020-10-09 DIAGNOSIS — I5043 Acute on chronic combined systolic (congestive) and diastolic (congestive) heart failure: Secondary | ICD-10-CM

## 2020-10-09 DIAGNOSIS — Z978 Presence of other specified devices: Secondary | ICD-10-CM

## 2020-10-09 DIAGNOSIS — N179 Acute kidney failure, unspecified: Secondary | ICD-10-CM

## 2020-10-09 DIAGNOSIS — N189 Chronic kidney disease, unspecified: Secondary | ICD-10-CM

## 2020-10-09 DIAGNOSIS — G931 Anoxic brain damage, not elsewhere classified: Secondary | ICD-10-CM

## 2020-10-09 LAB — BLOOD GAS, ARTERIAL
Acid-base deficit: 2 mmol/L (ref 0.0–2.0)
Bicarbonate: 24.7 mmol/L (ref 20.0–28.0)
FIO2: 0.6
MECHVT: 550 mL
O2 Saturation: 99.2 %
PEEP: 5 cmH2O
Patient temperature: 37
RATE: 16 resp/min
pCO2 arterial: 48 mmHg (ref 32.0–48.0)
pH, Arterial: 7.32 — ABNORMAL LOW (ref 7.350–7.450)
pO2, Arterial: 150 mmHg — ABNORMAL HIGH (ref 83.0–108.0)

## 2020-10-09 LAB — CBC
HCT: 49.2 % (ref 39.0–52.0)
Hemoglobin: 15.6 g/dL (ref 13.0–17.0)
MCH: 29.4 pg (ref 26.0–34.0)
MCHC: 31.7 g/dL (ref 30.0–36.0)
MCV: 92.7 fL (ref 80.0–100.0)
Platelets: 128 10*3/uL — ABNORMAL LOW (ref 150–400)
RBC: 5.31 MIL/uL (ref 4.22–5.81)
RDW: 14.6 % (ref 11.5–15.5)
WBC: 11.7 10*3/uL — ABNORMAL HIGH (ref 4.0–10.5)
nRBC: 0 % (ref 0.0–0.2)

## 2020-10-09 LAB — HEPARIN LEVEL (UNFRACTIONATED)
Heparin Unfractionated: 0.36 IU/mL (ref 0.30–0.70)
Heparin Unfractionated: 0.58 IU/mL (ref 0.30–0.70)

## 2020-10-09 LAB — ECHOCARDIOGRAM COMPLETE
AR max vel: 3.48 cm2
AV Area VTI: 2.91 cm2
AV Area mean vel: 3.37 cm2
AV Mean grad: 5 mmHg
AV Peak grad: 9.5 mmHg
Ao pk vel: 1.54 m/s
Area-P 1/2: 3.36 cm2
Calc EF: 44.6 %
Height: 75 in
MV VTI: 3.39 cm2
S' Lateral: 4.74 cm
Single Plane A2C EF: 42.7 %
Single Plane A4C EF: 48.2 %
Weight: 3005.31 oz

## 2020-10-09 LAB — GLUCOSE, CAPILLARY
Glucose-Capillary: 100 mg/dL — ABNORMAL HIGH (ref 70–99)
Glucose-Capillary: 106 mg/dL — ABNORMAL HIGH (ref 70–99)
Glucose-Capillary: 42 mg/dL — CL (ref 70–99)
Glucose-Capillary: 53 mg/dL — ABNORMAL LOW (ref 70–99)
Glucose-Capillary: 58 mg/dL — ABNORMAL LOW (ref 70–99)
Glucose-Capillary: 65 mg/dL — ABNORMAL LOW (ref 70–99)
Glucose-Capillary: 66 mg/dL — ABNORMAL LOW (ref 70–99)
Glucose-Capillary: 71 mg/dL (ref 70–99)
Glucose-Capillary: 72 mg/dL (ref 70–99)
Glucose-Capillary: 72 mg/dL (ref 70–99)
Glucose-Capillary: 73 mg/dL (ref 70–99)
Glucose-Capillary: 78 mg/dL (ref 70–99)
Glucose-Capillary: 81 mg/dL (ref 70–99)
Glucose-Capillary: 87 mg/dL (ref 70–99)
Glucose-Capillary: 97 mg/dL (ref 70–99)

## 2020-10-09 LAB — TROPONIN I (HIGH SENSITIVITY)
Troponin I (High Sensitivity): 166 ng/L (ref <18)
Troponin I (High Sensitivity): 201 ng/L (ref <18)
Troponin I (High Sensitivity): 312 ng/L (ref <18)

## 2020-10-09 LAB — COMPREHENSIVE METABOLIC PANEL
ALT: 34 U/L (ref 0–44)
AST: 40 U/L (ref 15–41)
Albumin: 4.2 g/dL (ref 3.5–5.0)
Alkaline Phosphatase: 57 U/L (ref 38–126)
Anion gap: 13 (ref 5–15)
BUN: 26 mg/dL — ABNORMAL HIGH (ref 8–23)
CO2: 21 mmol/L — ABNORMAL LOW (ref 22–32)
Calcium: 9 mg/dL (ref 8.9–10.3)
Chloride: 106 mmol/L (ref 98–111)
Creatinine, Ser: 2.18 mg/dL — ABNORMAL HIGH (ref 0.61–1.24)
GFR, Estimated: 32 mL/min — ABNORMAL LOW (ref >60)
Glucose, Bld: 75 mg/dL (ref 70–99)
Potassium: 4.7 mmol/L (ref 3.5–5.1)
Sodium: 140 mmol/L (ref 135–145)
Total Bilirubin: 1.1 mg/dL (ref 0.3–1.2)
Total Protein: 8.3 g/dL — ABNORMAL HIGH (ref 6.5–8.1)

## 2020-10-09 LAB — PHOSPHORUS: Phosphorus: 3.6 mg/dL (ref 2.5–4.6)

## 2020-10-09 LAB — CORTISOL-AM, BLOOD: Cortisol - AM: 8.9 ug/dL (ref 6.7–22.6)

## 2020-10-09 LAB — MAGNESIUM: Magnesium: 1.9 mg/dL (ref 1.7–2.4)

## 2020-10-09 LAB — PROCALCITONIN: Procalcitonin: <0.1 ng/mL

## 2020-10-09 MED ORDER — FREE WATER
30.0000 mL | Status: DC
  Administered 2020-10-09 – 2020-10-13 (×14): 30 mL

## 2020-10-09 MED ORDER — NOREPINEPHRINE 4 MG/250ML-% IV SOLN: 2.0000 ug/min | INTRAVENOUS | Status: DC

## 2020-10-09 MED ORDER — DEXTROSE 10 % IV SOLN: INTRAVENOUS | Status: DC

## 2020-10-09 MED ORDER — HEPARIN (PORCINE) 25000 UT/250ML-% IV SOLN
1100.0000 [IU]/h | INTRAVENOUS | Status: DC
  Administered 2020-10-09: 1100 [IU]/h via INTRAVENOUS
  Filled 2020-10-09: qty 250

## 2020-10-09 MED ORDER — SODIUM CHLORIDE 0.9 % IV SOLN
250.0000 mL | INTRAVENOUS | Status: DC
  Administered 2020-10-09: 250 mL via INTRAVENOUS

## 2020-10-09 MED ORDER — GADOBUTROL 1 MMOL/ML IV SOLN
7.5000 mL | Freq: Once | INTRAVENOUS | Status: AC | PRN
  Administered 2020-10-09: 7.5 mL via INTRAVENOUS

## 2020-10-09 MED ORDER — FENTANYL BOLUS VIA INFUSION
25.0000 ug | INTRAVENOUS | Status: DC | PRN
  Administered 2020-10-09: 25 ug via INTRAVENOUS
  Filled 2020-10-09: qty 25

## 2020-10-09 MED ORDER — PROSOURCE TF PO LIQD
45.0000 mL | Freq: Two times a day (BID) | ORAL | Status: DC
Start: 2020-10-09 — End: 2020-10-12
  Administered 2020-10-09 – 2020-10-10 (×3): 45 mL
  Filled 2020-10-09: qty 45

## 2020-10-09 MED ORDER — VITAL 1.5 CAL PO LIQD
1000.0000 mL | ORAL | Status: DC
Start: 2020-10-09 — End: 2020-10-12
  Administered 2020-10-09 – 2020-10-12 (×3): 1000 mL

## 2020-10-09 MED ORDER — DEXTROSE 50 % IV SOLN
INTRAVENOUS | Status: AC
  Administered 2020-10-09: 12.5 g via INTRAVENOUS
  Filled 2020-10-09: qty 50

## 2020-10-09 MED ORDER — HEPARIN SODIUM (PORCINE) 5000 UNIT/ML IJ SOLN
5000.0000 [IU] | Freq: Three times a day (TID) | INTRAMUSCULAR | Status: DC
  Administered 2020-10-09 – 2020-10-14 (×14): 5000 [IU] via SUBCUTANEOUS
  Filled 2020-10-09 (×14): qty 1

## 2020-10-09 MED ORDER — DEXTROSE 50 % IV SOLN: 25.0000 g | INTRAVENOUS | Status: AC

## 2020-10-09 MED ORDER — DEXTROSE 50 % IV SOLN
INTRAVENOUS | Status: AC
  Administered 2020-10-09: 25 g via INTRAVENOUS
  Filled 2020-10-09: qty 50

## 2020-10-09 MED ORDER — DEXTROSE 50 % IV SOLN: 12.5000 g | INTRAVENOUS | Status: AC

## 2020-10-09 MED ORDER — MIDAZOLAM HCL 2 MG/2ML IJ SOLN: 2.0000 mg | INTRAMUSCULAR | Status: DC | PRN

## 2020-10-09 MED ORDER — DEXTROSE 50 % IV SOLN
25.0000 g | INTRAVENOUS | Status: AC
  Administered 2020-10-09: 25 g via INTRAVENOUS
  Filled 2020-10-09: qty 50
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
## 2020-10-09 NOTE — Progress Notes (Signed)
Patient Trops went from [phone]. Jonny Ruiz, NP notified. Heparin gtt started per order.
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
## 2020-10-09 NOTE — Progress Notes (Signed)
Advanced OGT to 93 marking. Verified with auscultation. Confirmed okay to use by NP without repeat imaging. PRN Tylenol given for rising temp, 37.4. Will continue to monitor.

## 2020-10-09 NOTE — Progress Notes (Signed)
Patient shivering moderately causing temperature to elevated. Ice packs refreshed. Fentanyl bolus given per NP order. Will continue to monitor.

## 2020-10-09 NOTE — Plan of Care (Signed)

## 2020-10-09 NOTE — Progress Notes (Signed)
Eeg done 

## 2020-10-09 NOTE — Progress Notes (Signed)
*  PRELIMINARY RESULTS* Echocardiogram 2D Echocardiogram has been performed.  Jonathon Rios 10/09/2020, 10:34 AM
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
## 2020-10-09 NOTE — Progress Notes (Signed)
Patient urine output decreased since IVFs held, only 10-35m/hr. Np notified. IVFs restarted at this time. Will continue to monitor.

## 2020-10-09 NOTE — Progress Notes (Signed)
Patient clamped down on tongue during admission assessment. Bloody secretions from ETT and oral suction. Jonny Ruiz, NP made aware. Will continue to monitor.

## 2020-10-09 NOTE — Progress Notes (Signed)
Patient blood sugar 70. Hypoglycemic protocol followed. Will continue to monitor.
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
## 2020-10-09 NOTE — Progress Notes (Signed)
Patient temp reached 37.6 at this time despite ice packs being applied. [place] pads applied per cooling protocol and NP notified.

## 2020-10-09 NOTE — Procedures (Signed)
Patient Name: SARVESH BURDGE  MRN: [id number]  Epilepsy Attending: Lora Havens  Referring Physician/Provider: Darel Hong, NP Date: 10/09/2020 Duration: 31 mins  Patient history: 70 year old man status post cardiac arrest.  EEG to evaluate for seizures.  Level of alertness: comatose  AEDs during EEG study: LEV, versed  Technical aspects: This EEG study was done with scalp electrodes positioned according to the 10-20 International system of electrode placement. Electrical activity was acquired at a sampling rate of '500Hz'$  and reviewed with a high frequency filter of '70Hz'$  and a low frequency filter of '1Hz'$ . EEG data were recorded continuously and digitally stored.   Description: EEG showed continuous generalized 3 to 6 Hz theta-delta slowing. Hyperventilation and photic stimulation were not performed.     ABNORMALITY - Continuous slow, generalized  IMPRESSION: This study is suggestive of moderate to severe diffuse encephalopathy, nonspecific etiology. No seizures or epileptiform discharges were seen throughout the recording.  Suresh Audi Barbra Sarks

## 2020-10-09 NOTE — Progress Notes (Signed)
Patient continues to shiver and temp increasing. BP elevated. NP at bedside. Sedation maxed per order.Will continue to monitor.

## 2020-10-09 NOTE — Progress Notes (Signed)
Patient transported to MRI via transport vent with  RN.
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
## 2020-10-10 ENCOUNTER — Inpatient Hospital Stay: Payer: Self-pay

## 2020-10-10 DIAGNOSIS — J9601 Acute respiratory failure with hypoxia: Secondary | ICD-10-CM | POA: Diagnosis not present

## 2020-10-10 DIAGNOSIS — I469 Cardiac arrest, cause unspecified: Secondary | ICD-10-CM | POA: Diagnosis not present

## 2020-10-10 LAB — CBC
HCT: 50.9 % (ref 39.0–52.0)
Hemoglobin: 16 g/dL (ref 13.0–17.0)
MCH: 29.6 pg (ref 26.0–34.0)
MCHC: 31.4 g/dL (ref 30.0–36.0)
MCV: 94.1 fL (ref 80.0–100.0)
Platelets: 73 10*3/uL — ABNORMAL LOW (ref 150–400)
RBC: 5.41 MIL/uL (ref 4.22–5.81)
RDW: 14.6 % (ref 11.5–15.5)
WBC: 11.2 10*3/uL — ABNORMAL HIGH (ref 4.0–10.5)
nRBC: 0 % (ref 0.0–0.2)

## 2020-10-10 LAB — GLUCOSE, CAPILLARY
Glucose-Capillary: 159 mg/dL — ABNORMAL HIGH (ref 70–99)
Glucose-Capillary: 167 mg/dL — ABNORMAL HIGH (ref 70–99)
Glucose-Capillary: 184 mg/dL — ABNORMAL HIGH (ref 70–99)
Glucose-Capillary: 64 mg/dL — ABNORMAL LOW (ref 70–99)
Glucose-Capillary: 75 mg/dL (ref 70–99)
Glucose-Capillary: 80 mg/dL (ref 70–99)
Glucose-Capillary: 83 mg/dL (ref 70–99)

## 2020-10-10 LAB — COMPREHENSIVE METABOLIC PANEL
ALT: 27 U/L (ref 0–44)
AST: 39 U/L (ref 15–41)
Albumin: 3.7 g/dL (ref 3.5–5.0)
Alkaline Phosphatase: 47 U/L (ref 38–126)
Anion gap: 11 (ref 5–15)
BUN: 27 mg/dL — ABNORMAL HIGH (ref 8–23)
CO2: 21 mmol/L — ABNORMAL LOW (ref 22–32)
Calcium: 8.2 mg/dL — ABNORMAL LOW (ref 8.9–10.3)
Chloride: 108 mmol/L (ref 98–111)
Creatinine, Ser: 1.95 mg/dL — ABNORMAL HIGH (ref 0.61–1.24)
GFR, Estimated: 37 mL/min — ABNORMAL LOW (ref >60)
Glucose, Bld: 74 mg/dL (ref 70–99)
Potassium: 4.7 mmol/L (ref 3.5–5.1)
Sodium: 140 mmol/L (ref 135–145)
Total Bilirubin: 1.1 mg/dL (ref 0.3–1.2)
Total Protein: 7.6 g/dL (ref 6.5–8.1)

## 2020-10-10 LAB — PHOSPHORUS
Phosphorus: 3.5 mg/dL (ref 2.5–4.6)
Phosphorus: 2 mg/dL — ABNORMAL LOW (ref 2.5–4.6)

## 2020-10-10 LAB — URINE CULTURE: Culture: NO GROWTH

## 2020-10-10 LAB — MAGNESIUM
Magnesium: 2.1 mg/dL (ref 1.7–2.4)
Magnesium: 1.9 mg/dL (ref 1.7–2.4)

## 2020-10-10 MED ORDER — SODIUM CHLORIDE 0.9% FLUSH
10.0000 mL | Freq: Two times a day (BID) | INTRAVENOUS | Status: DC
  Administered 2020-10-10 – 2020-10-14 (×7): 10 mL

## 2020-10-10 MED ORDER — COSYNTROPIN 0.25 MG IJ SOLR
0.2500 mg | Freq: Once | INTRAMUSCULAR | Status: AC
  Administered 2020-10-10: 0.25 mg via INTRAVENOUS
  Filled 2020-10-10: qty 0.25

## 2020-10-10 MED ORDER — FAMOTIDINE 20 MG PO TABS
20.0000 mg | ORAL_TABLET | Freq: Two times a day (BID) | ORAL | Status: DC
  Filled 2020-10-10: qty 1

## 2020-10-10 MED ORDER — METRONIDAZOLE 500 MG/100ML IV SOLN
500.0000 mg | Freq: Three times a day (TID) | INTRAVENOUS | Status: DC
  Administered 2020-10-11 – 2020-10-12 (×5): 500 mg via INTRAVENOUS
  Filled 2020-10-10 (×8): qty 100

## 2020-10-10 MED ORDER — DEXTROSE 50 % IV SOLN
INTRAVENOUS | Status: AC
  Administered 2020-10-10: 12.5 g via INTRAVENOUS
  Filled 2020-10-10: qty 50

## 2020-10-10 MED ORDER — CEFTRIAXONE SODIUM 1 G IJ SOLR
1.0000 g | INTRAMUSCULAR | Status: DC
Start: 2020-10-10 — End: 2020-10-14
  Administered 2020-10-10 – 2020-10-14 (×4): 1 g via INTRAVENOUS
  Filled 2020-10-10: qty 1
  Filled 2020-10-10 (×2): qty 10
  Filled 2020-10-10 (×2): qty 1
  Filled 2020-10-10: qty 10

## 2020-10-10 MED ORDER — DEXTROSE 50 % IV SOLN: 12.5000 g | INTRAVENOUS | Status: AC

## 2020-10-10 MED ORDER — PHENYLEPHRINE HCL 0.5 % NA SOLN
1.0000 [drp] | Freq: Four times a day (QID) | NASAL | Status: AC | PRN
  Administered 2020-10-10: 1 [drp] via NASAL
  Filled 2020-10-10: qty 15

## 2020-10-10 MED ORDER — PHENYLEPHRINE HCL 0.5 % NA SOLN: 1.0000 [drp] | Freq: Four times a day (QID) | NASAL | Status: DC | PRN

## 2020-10-10 MED ORDER — SODIUM CHLORIDE 0.9% FLUSH: 10.0000 mL | INTRAVENOUS | Status: DC | PRN
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
## 2020-10-10 NOTE — Progress Notes (Addendum)
ICU ATTENDING ATTESTATION: For Jonathon Rios, NPs progress note dated 13 August 9:20 AM.  Patient seen and examined and relevant ancillary tests reviewed.   I agree with the assessment and plan of care as outlined by Jonathon Falco, NP.  All aspects of the assessment and plan were agreed upon. This patient was seen as a shared visit. I  personally  reviewed database in its entirety and discussed care plan in detail.  Please see Jonathon Rios, NPs note separate. PHYSICAL EXAMINATION:  GENERAL:critically ill appearing, intubated, mechanically ventilated HEAD: Normocephalic, atraumatic.  EYES: Pupils equal, round, reactive to light.  No scleral icterus.  MOUTH: ETT in place, OG in place NECK: Supple.  PULMONARY: Worse breath sounds, no other adventitious sounds CARDIOVASCULAR: S1 and S2. Regular rate and rhythm. No murmurs, rubs, or gallops.  GASTROINTESTINAL: Soft, non-distended. Positive bowel sounds.  MUSCULOSKELETAL: No swelling, clubbing, or edema.  NEUROLOGIC: obtunded SKIN:intact,warm,dry   Assessment and plan:  As noted on Jonathon Rios, NPs note this is a 70 year old with known nonischemic cardiomyopathy who suffered a hospital cardiac arrest with V. fib as the initial rhythm.  Patient has been weaned off Versed.  Too early to tell neurologic sequela given use of benzodiazepines previously.  We will reassess in the a.m. she has been having some issues with hypoglycemia and is requiring D10.  We are investigating adrenal axis with ACTH stim test.  May need reimaging as well as repeat EEG.  Initial testing was not supportive of anoxic encephalopathy.  [place] assessment and plan as noted in [place] progress note dated 13 August 9:20 AM.  The patient is critically ill with multiple organ systems failure and requires high complexity decision making for assessment and support, frequent evaluation and titration of therapies, application of advanced monitoring technologies and  extensive interpretation of multiple databases.   Renold Don, MD Advanced Bronchoscopy PCCM [facility]    *This note was dictated using voice recognition software/Dragon.  Despite best efforts to proofread, errors can occur which can change the meaning.  Any change was purely unintentional.

## 2020-10-10 NOTE — Progress Notes (Signed)
Peripherally Inserted Central Catheter Placement  The IV Nurse has discussed with the patient and/or persons authorized to consent for the patient, the purpose of this procedure and the potential benefits and risks involved with this procedure.  The benefits include less needle sticks, lab draws from the catheter, and the patient may be discharged home with the catheter. Risks include, but not limited to, infection, bleeding, blood clot (thrombus formation), and puncture of an artery; nerve damage and irregular heartbeat and possibility to perform a PICC exchange if needed/ordered by physician.  Alternatives to this procedure were also discussed.  Bard Power PICC patient education guide, fact sheet on infection prevention and patient information card has been provided to patient /or left at bedside.  Telephone consent obtained from the bedside RN.  PICC Placement Documentation  PICC Double Lumen 10/10/20 PICC Right Brachial 40 cm 0 cm (Active)  Indication for Insertion or Continuance of Line Vasoactive infusions;Poor Vasculature-patient has had multiple peripheral attempts or PIVs lasting less than 24 hours;Chronic illness with exacerbations (CF, Sickle Cell, etc.);Prolonged intravenous therapies 10/10/20 1522  Exposed Catheter (cm) 0 cm 10/10/20 1522  Site Assessment Clean;Dry;Intact 10/10/20 1522  Lumen #1 Status Flushed;Saline locked;Blood return noted 10/10/20 1522  Lumen #2 Status Flushed;Saline locked;Blood return noted 10/10/20 1522  Dressing Type Transparent 10/10/20 1522  Dressing Status Clean;Dry;Intact 10/10/20 1522  Antimicrobial disc in place? Yes 10/10/20 [place] Not Applicable [id number]  Line Care Connections checked and tightened 10/10/20 1522  Line Adjustment (NICU/IV Team Only) No 10/10/20 1522  Dressing Intervention New dressing 10/10/20 1522  Dressing Change Due 10/17/20 10/10/20 1522       Jonathon Rios 10/10/2020, 3:22 PM

## 2020-10-10 NOTE — Progress Notes (Signed)
PHARMACIST - PHYSICIAN COMMUNICATION  CONCERNING: IV to Oral Route Change Policy  RECOMMENDATION: This patient is receiving famotidine by the intravenous route.  Based on criteria approved by the Pharmacy and Therapeutics Committee, the intravenous medication(s) is/are being converted to the equivalent oral dose form(s).   DESCRIPTION: These criteria include: The patient is eating (either orally or via tube) and/or has been taking other orally administered medications for a least 24 hours The patient has no evidence of active gastrointestinal bleeding or impaired GI absorption (gastrectomy, short bowel, patient on TNA or NPO).  If you have questions about this conversion, please contact the [facility] 10/10/2020 12:16 PM
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
## 2020-10-11 ENCOUNTER — Inpatient Hospital Stay: Payer: Medicare Other

## 2020-10-11 DIAGNOSIS — I5043 Acute on chronic combined systolic (congestive) and diastolic (congestive) heart failure: Secondary | ICD-10-CM

## 2020-10-11 DIAGNOSIS — I469 Cardiac arrest, cause unspecified: Secondary | ICD-10-CM | POA: Diagnosis not present

## 2020-10-11 DIAGNOSIS — J9601 Acute respiratory failure with hypoxia: Secondary | ICD-10-CM | POA: Diagnosis not present

## 2020-10-11 DIAGNOSIS — I5021 Acute systolic (congestive) heart failure: Secondary | ICD-10-CM

## 2020-10-11 LAB — CBC
HCT: 37.9 % — ABNORMAL LOW (ref 39.0–52.0)
Hemoglobin: 12.8 g/dL — ABNORMAL LOW (ref 13.0–17.0)
MCH: 30.8 pg (ref 26.0–34.0)
MCHC: 33.8 g/dL (ref 30.0–36.0)
MCV: 91.1 fL (ref 80.0–100.0)
Platelets: 89 10*3/uL — ABNORMAL LOW (ref 150–400)
RBC: 4.16 MIL/uL — ABNORMAL LOW (ref 4.22–5.81)
RDW: 14.2 % (ref 11.5–15.5)
WBC: 9 10*3/uL (ref 4.0–10.5)
nRBC: 0 % (ref 0.0–0.2)

## 2020-10-11 LAB — COMPREHENSIVE METABOLIC PANEL
ALT: 19 U/L (ref 0–44)
AST: 22 U/L (ref 15–41)
Albumin: 3 g/dL — ABNORMAL LOW (ref 3.5–5.0)
Alkaline Phosphatase: 42 U/L (ref 38–126)
Anion gap: 5 (ref 5–15)
BUN: 33 mg/dL — ABNORMAL HIGH (ref 8–23)
CO2: 25 mmol/L (ref 22–32)
Calcium: 7.8 mg/dL — ABNORMAL LOW (ref 8.9–10.3)
Chloride: 104 mmol/L (ref 98–111)
Creatinine, Ser: 1.86 mg/dL — ABNORMAL HIGH (ref 0.61–1.24)
GFR, Estimated: 39 mL/min — ABNORMAL LOW (ref >60)
Glucose, Bld: 401 mg/dL — ABNORMAL HIGH (ref 70–99)
Potassium: 3.8 mmol/L (ref 3.5–5.1)
Sodium: 134 mmol/L — ABNORMAL LOW (ref 135–145)
Total Bilirubin: 0.8 mg/dL (ref 0.3–1.2)
Total Protein: 6.8 g/dL (ref 6.5–8.1)

## 2020-10-11 LAB — THYROID PANEL WITH TSH
Free Thyroxine Index: 2.5 (ref 1.2–4.9)
T3 Uptake Ratio: 35 % (ref 24–39)
T4, Total: 7 ug/dL (ref 4.5–12.0)
TSH: 0.615 u[IU]/mL (ref 0.450–4.500)

## 2020-10-11 LAB — PHOSPHORUS: Phosphorus: 1.8 mg/dL — ABNORMAL LOW (ref 2.5–4.6)

## 2020-10-11 LAB — PROCALCITONIN: Procalcitonin: 0.27 ng/mL

## 2020-10-11 LAB — GLUCOSE, CAPILLARY
Glucose-Capillary: 101 mg/dL — ABNORMAL HIGH (ref 70–99)
Glucose-Capillary: 118 mg/dL — ABNORMAL HIGH (ref 70–99)
Glucose-Capillary: 126 mg/dL — ABNORMAL HIGH (ref 70–99)
Glucose-Capillary: 129 mg/dL — ABNORMAL HIGH (ref 70–99)
Glucose-Capillary: 76 mg/dL (ref 70–99)
Glucose-Capillary: 92 mg/dL (ref 70–99)
Glucose-Capillary: 95 mg/dL (ref 70–99)

## 2020-10-11 LAB — CULTURE, RESPIRATORY W GRAM STAIN: Culture: NORMAL

## 2020-10-11 LAB — TRIGLYCERIDES: Triglycerides: 65 mg/dL (ref <150)

## 2020-10-11 LAB — MAGNESIUM: Magnesium: 1.6 mg/dL — ABNORMAL LOW (ref 1.7–2.4)

## 2020-10-11 MED ORDER — ONDANSETRON HCL 4 MG/2ML IJ SOLN
INTRAMUSCULAR | Status: AC
  Administered 2020-10-11: 4 mg via INTRAVENOUS
  Filled 2020-10-11: qty 2

## 2020-10-11 MED ORDER — METOCLOPRAMIDE HCL 5 MG/ML IJ SOLN
5.0000 mg | Freq: Three times a day (TID) | INTRAMUSCULAR | Status: DC
  Administered 2020-10-11 – 2020-10-14 (×9): 5 mg via INTRAVENOUS
  Filled 2020-10-11 (×9): qty 2

## 2020-10-11 MED ORDER — MIDAZOLAM HCL 2 MG/2ML IJ SOLN
2.0000 mg | INTRAMUSCULAR | Status: DC | PRN
  Administered 2020-10-11 – 2020-10-13 (×6): 2 mg via INTRAVENOUS
  Filled 2020-10-11 (×6): qty 2

## 2020-10-11 MED ORDER — FENTANYL CITRATE (PF) 100 MCG/2ML IJ SOLN
50.0000 ug | INTRAMUSCULAR | Status: DC | PRN
  Administered 2020-10-11: 50 ug via INTRAVENOUS
  Filled 2020-10-11: qty 2

## 2020-10-11 MED ORDER — PANTOPRAZOLE SODIUM 40 MG IV SOLR
40.0000 mg | Freq: Two times a day (BID) | INTRAVENOUS | Status: DC
  Administered 2020-10-11 – 2020-10-14 (×7): 40 mg via INTRAVENOUS
  Filled 2020-10-11 (×7): qty 40

## 2020-10-11 MED ORDER — ONDANSETRON HCL 4 MG/2ML IJ SOLN: 4.0000 mg | Freq: Four times a day (QID) | INTRAMUSCULAR | Status: DC | PRN

## 2020-10-11 NOTE — Plan of Care (Signed)
Neuro: intermittently following commands, responds to voice/pain easily, localizes pain-purposeful protection movement, remains on Propofol Resp: tolerating ventilator settings well CV: afebrile, vital signs fairly stable-intermittent hypotension GIGU: remains NPO, OG to LIS, external catheter in place, smear BM, no emesis Skin:clean dry and intact Social: Daughters, Kennyth Lose and [place] visiting today, all questions and concerns addressed  Problem: Clinical Measurements: Goal: Ability to maintain clinical measurements within normal limits will improve Outcome: Progressing Goal: Will remain free from infection Outcome: Progressing Goal: Diagnostic test results will improve Outcome: Progressing Goal: Respiratory complications will improve Outcome: Progressing Goal: Cardiovascular complication will be avoided Outcome: Progressing   Problem: Activity: Goal: Risk for activity intolerance will decrease Outcome: Progressing   Problem: Coping: Goal: Level of anxiety will decrease Outcome: Progressing   Problem: Elimination: Goal: Will not experience complications related to bowel motility Outcome: Progressing Goal: Will not experience complications related to urinary retention Outcome: Progressing   Problem: Pain Managment: Goal: General experience of comfort will improve Outcome: Progressing   Problem: Safety: Goal: Ability to remain free from injury will improve Outcome: Progressing   Problem: Skin Integrity: Goal: Risk for impaired skin integrity will decrease Outcome: Progressing
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
## 2020-10-11 NOTE — Progress Notes (Addendum)
Subjective: Now in an awake, semi-responsive state. Versed has been stopped. Propofol currently at a rate of 15.   Objective: Current vital signs: BP (!) 160/97   Pulse 84   Temp 98.2 F (36.8 C)   Resp 12   Ht _0  (1.905 m)   Wt 86.9 kg   SpO2 100%   BMI 23.95 kg/m  Vital signs in last 24 hours: Temp:  [98.2 F (36.8 C)-102.2 F (39 C)] 98.2 F (36.8 C) (08/14 0400) Pulse Rate:  [70-92] 84 (08/14 0645) Resp:  [12-18] 12 (08/13 1400) BP: (83-161)/(68-109) 160/97 (08/14 0645) SpO2:  [99 %-100 %] 100 % (08/14 0645) FiO2 (%):  [40 %-50 %] 40 % (08/14 0250) Weight:  [86.9 kg] 86.9 kg (08/14 0500)  Intake/Output from previous day: 08/13 0701 - 08/14 0700 In: 1553.4 [I.V.:983.3; NG/GT:120; IV Piggyback:450] Out: 1200 [Urine:550; Emesis/NG output:650] Intake/Output this shift: No intake/output data recorded. Nutritional status:  Diet Order     None      Physical Exam  HEENT-  Duck/AT   Lungs- Intubated    Neurological Examination Intubated and sedated on propofol gtt at a rate of 15.  Mental Status: Opens eyes to noxious stimuli. Localizes to pain bilaterally. Follows commands to wiggle toes bilaterally. Does not make eye contact.  Cranial Nerves: II: Intermittently blinks to threat but does not track.  III,IV, VI: No forced gaze deviation.  VII: Unable to assess for facial droop in the setting of intubation.  VIII: Responds to voice  IX,X: Intact cough reflex XI: Unable to assess XII: Unable to assess Motor/Sensory: BUE: Now localizing to noxious stimuli without asymmetry.   BLE Withdraws bilaterally to noxious plantar stimulation without asymmetry.   Lab Results: Results for orders placed or performed during the hospital encounter of 10/08/20 (from the past 48 hour(s))  Glucose, capillary     Status: Abnormal   Collection Time: 10/09/20  1:52 PM  Result Value Ref Range   Glucose-Capillary 42 (LL) 70 - 99 mg/dL    Comment: Glucose reference range applies  only to samples taken after fasting for at least 8 hours.   Comment 1 Notify RN   Glucose, capillary     Status: Abnormal   Collection Time: 10/09/20  2:10 PM  Result Value Ref Range   Glucose-Capillary 65 (L) 70 - 99 mg/dL    Comment: Glucose reference range applies only to samples taken after fasting for at least 8 hours.  Glucose, capillary     Status: None   Collection Time: 10/09/20  2:38 PM  Result Value Ref Range   Glucose-Capillary 78 70 - 99 mg/dL    Comment: Glucose reference range applies only to samples taken after fasting for at least 8 hours.  Glucose, capillary     Status: None   Collection Time: 10/09/20  3:41 PM  Result Value Ref Range   Glucose-Capillary 72 70 - 99 mg/dL    Comment: Glucose reference range applies only to samples taken after fasting for at least 8 hours.  Magnesium     Status: None   Collection Time: 10/09/20  3:48 PM  Result Value Ref Range   Magnesium 1.9 1.7 - 2.4 mg/dL    Comment: Performed at [facility], [address] [place], [place] [address]  Phosphorus     Status: None   Collection Time: 10/09/20  3:48 PM  Result Value Ref Range   Phosphorus 3.6 2.5 - 4.6 mg/dL    Comment: Performed at [employer]  [facility], [place], [place] [address]  Heparin level (unfractionated)     Status: None   Collection Time: 10/09/20  3:48 PM  Result Value Ref Range   Heparin Unfractionated 0.58 0.30 - 0.70 IU/mL    Comment: (NOTE) The clinical reportable range upper limit is being lowered to >1.10 to align with the FDA approved guidance for the current laboratory assay.  If heparin results are below expected values, and patient dosage has  been confirmed, suggest follow up testing of antithrombin III levels. Performed at [facility], [address] [place], [place] [address]   Glucose, capillary     Status: Abnormal   Collection Time: 10/09/20  7:20 PM  Result Value Ref Range   Glucose-Capillary 58 (L) 70 - 99  mg/dL    Comment: Glucose reference range applies only to samples taken after fasting for at least 8 hours.   Comment 1 Notify RN    Comment 2 Document in Chart   Glucose, capillary     Status: Abnormal   Collection Time: 10/09/20  7:38 PM  Result Value Ref Range   Glucose-Capillary 53 (L) 70 - 99 mg/dL    Comment: Glucose reference range applies only to samples taken after fasting for at least 8 hours.   Comment 1 Notify RN    Comment 2 Document in Chart   Glucose, capillary     Status: Abnormal   Collection Time: 10/09/20  7:59 PM  Result Value Ref Range   Glucose-Capillary 66 (L) 70 - 99 mg/dL    Comment: Glucose reference range applies only to samples taken after fasting for at least 8 hours.   Comment 1 Notify RN    Comment 2 Document in Chart   Glucose, capillary     Status: None   Collection Time: 10/09/20  8:56 PM  Result Value Ref Range   Glucose-Capillary 81 70 - 99 mg/dL    Comment: Glucose reference range applies only to samples taken after fasting for at least 8 hours.   Comment 1 Notify RN    Comment 2 Document in Chart   Glucose, capillary     Status: None   Collection Time: 10/09/20  9:51 PM  Result Value Ref Range   Glucose-Capillary 72 70 - 99 mg/dL    Comment: Glucose reference range applies only to samples taken after fasting for at least 8 hours.   Comment 1 Notify RN    Comment 2 Document in Chart   Glucose, capillary     Status: None   Collection Time: 10/09/20 10:53 PM  Result Value Ref Range   Glucose-Capillary 87 70 - 99 mg/dL    Comment: Glucose reference range applies only to samples taken after fasting for at least 8 hours.   Comment 1 Notify RN    Comment 2 Document in Chart   Glucose, capillary     Status: None   Collection Time: 10/09/20 11:55 PM  Result Value Ref Range   Glucose-Capillary 71 70 - 99 mg/dL    Comment: Glucose reference range applies only to samples taken after fasting for at least 8 hours.   Comment 1 Notify RN    Comment 2  Document in Chart   Glucose, capillary     Status: None   Collection Time: 10/10/20  3:14 AM  Result Value Ref Range   Glucose-Capillary 83 70 - 99 mg/dL    Comment: Glucose reference range applies only to samples taken after fasting  for at least 8 hours.  CBC     Status: Abnormal   Collection Time: 10/10/20  3:50 AM  Result Value Ref Range   WBC 11.2 (H) 4.0 - 10.5 K/uL   RBC 5.41 4.22 - 5.81 MIL/uL   Hemoglobin 16.0 13.0 - 17.0 g/dL   HCT 50.9 39.0 - 52.0 %   MCV 94.1 80.0 - 100.0 fL   MCH 29.6 26.0 - 34.0 pg   MCHC 31.4 30.0 - 36.0 g/dL   RDW 14.6 11.5 - 15.5 %   Platelets 73 (L) 150 - 400 K/uL    Comment: Immature Platelet Fraction may be clinically indicated, consider ordering this additional test [id number]    nRBC 0.0 0.0 - 0.2 %    Comment: Performed at [facility], [address] [place], [place] [address]  Comprehensive metabolic panel     Status: Abnormal   Collection Time: 10/10/20  3:50 AM  Result Value Ref Range   Sodium 140 135 - 145 mmol/L   Potassium 4.7 3.5 - 5.1 mmol/L   Chloride 108 98 - 111 mmol/L   CO2 21 (L) 22 - 32 mmol/L   Glucose, Bld 74 70 - 99 mg/dL    Comment: Glucose reference range applies only to samples taken after fasting for at least 8 hours.   BUN 27 (H) 8 - 23 mg/dL   Creatinine, Ser 1.95 (H) 0.61 - 1.24 mg/dL   Calcium 8.2 (L) 8.9 - 10.3 mg/dL   Total Protein 7.6 6.5 - 8.1 g/dL   Albumin 3.7 3.5 - 5.0 g/dL   AST 39 15 - 41 U/L   ALT 27 0 - 44 U/L   Alkaline Phosphatase 47 38 - 126 U/L   Total Bilirubin 1.1 0.3 - 1.2 mg/dL   GFR, Estimated 37 (L) >60 mL/min    Comment: (NOTE) Calculated using the CKD-EPI Creatinine Equation (2021)    Anion gap 11 5 - 15    Comment: Performed at [facility], [address] [place], [place] [address]  Magnesium     Status: None   Collection Time: 10/10/20  3:50 AM  Result Value Ref Range   Magnesium 2.1 1.7 - 2.4 mg/dL    Comment: Performed at [facility], [address], [place], [place] [address]  Phosphorus     Status: None   Collection Time: 10/10/20  3:50 AM  Result Value Ref Range   Phosphorus 3.5 2.5 - 4.6 mg/dL    Comment: Performed at [facility], [address] [place], [place] [address]  Glucose, capillary     Status: Abnormal   Collection Time: 10/10/20  7:48 AM  Result Value Ref Range   Glucose-Capillary 64 (L) 70 - 99 mg/dL    Comment: Glucose reference range applies only to samples taken after fasting for at least 8 hours.  Glucose, capillary     Status: None   Collection Time: 10/10/20  8:13 AM  Result Value Ref Range   Glucose-Capillary 80 70 - 99 mg/dL    Comment: Glucose reference range applies only to samples taken after fasting for at least 8 hours.  Glucose, capillary     Status: None   Collection Time: 10/10/20 11:36 AM  Result Value Ref Range   Glucose-Capillary 75 70 - 99 mg/dL    Comment: Glucose reference range applies only to samples taken after fasting for at least 8 hours.  Magnesium     Status: None   Collection Time:  10/10/20  3:25 PM  Result Value Ref Range   Magnesium 1.9 1.7 - 2.4 mg/dL    Comment: Performed at [facility], [address] [place],  [address]  Phosphorus     Status: Abnormal   Collection Time: 10/10/20  3:25 PM  Result Value Ref Range   Phosphorus 2.0 (L) 2.5 - 4.6 mg/dL    Comment: Performed at [facility], [address] [place], [place] [address]  Glucose, capillary     Status: Abnormal   Collection Time: 10/10/20  4:02 PM  Result Value Ref Range   Glucose-Capillary 184 (H) 70 - 99 mg/dL    Comment: Glucose reference range applies only to samples taken after fasting for at least 8 hours.  Glucose, capillary     Status: Abnormal   Collection Time: 10/10/20  7:40 PM  Result Value Ref Range   Glucose-Capillary 167 (H) 70 - 99 mg/dL    Comment: Glucose reference range applies only to samples taken after fasting for at least 8 hours.    Comment 1 Notify RN    Comment 2 Document in Chart   Glucose, capillary     Status: Abnormal   Collection Time: 10/10/20 11:42 PM  Result Value Ref Range   Glucose-Capillary 159 (H) 70 - 99 mg/dL    Comment: Glucose reference range applies only to samples taken after fasting for at least 8 hours.  CBC     Status: Abnormal   Collection Time: 10/11/20  4:33 AM  Result Value Ref Range   WBC 9.0 4.0 - 10.5 K/uL   RBC 4.16 (L) 4.22 - 5.81 MIL/uL   Hemoglobin 12.8 (L) 13.0 - 17.0 g/dL   HCT 37.9 (L) 39.0 - 52.0 %   MCV 91.1 80.0 - 100.0 fL   MCH 30.8 26.0 - 34.0 pg   MCHC 33.8 30.0 - 36.0 g/dL   RDW 14.2 11.5 - 15.5 %   Platelets 89 (L) 150 - 400 K/uL    Comment: Immature Platelet Fraction may be clinically indicated, consider ordering this additional test [id number]    nRBC 0.0 0.0 - 0.2 %    Comment: Performed at [facility], [address] [place], [place] [address]  Glucose, capillary     Status: Abnormal   Collection Time: 10/11/20  5:02 AM  Result Value Ref Range   Glucose-Capillary 126 (H) 70 - 99 mg/dL    Comment: Glucose reference range applies only to samples taken after fasting for at least 8 hours.  Comprehensive metabolic panel     Status: Abnormal   Collection Time: 10/11/20  5:32 AM  Result Value Ref Range   Sodium 134 (L) 135 - 145 mmol/L   Potassium 3.8 3.5 - 5.1 mmol/L   Chloride 104 98 - 111 mmol/L   CO2 25 22 - 32 mmol/L   Glucose, Bld 401 (H) 70 - 99 mg/dL    Comment: Glucose reference range applies only to samples taken after fasting for at least 8 hours.   BUN 33 (H) 8 - 23 mg/dL   Creatinine, Ser 1.86 (H) 0.61 - 1.24 mg/dL   Calcium 7.8 (L) 8.9 - 10.3 mg/dL   Total Protein 6.8 6.5 - 8.1 g/dL   Albumin 3.0 (L) 3.5 - 5.0 g/dL   AST 22 15 - 41 U/L   ALT 19 0 - 44 U/L   Alkaline Phosphatase 42 38 - 126 U/L   Total Bilirubin 0.8 0.3 - 1.2 mg/dL  GFR, Estimated 39 (L) >60 mL/min    Comment: (NOTE) Calculated using the CKD-EPI Creatinine  Equation (2021)    Anion gap 5 5 - 15    Comment: Performed at [facility], [address] [place], [place] [address]  Magnesium     Status: Abnormal   Collection Time: 10/11/20  5:32 AM  Result Value Ref Range   Magnesium 1.6 (L) 1.7 - 2.4 mg/dL    Comment: Performed at [facility], [address] [place], [place] [address]  Procalcitonin     Status: None   Collection Time: 10/11/20  5:32 AM  Result Value Ref Range   Procalcitonin 0.27 ng/mL    Comment:        Interpretation: PCT (Procalcitonin) <= 0.5 ng/mL: Systemic infection (sepsis) is not likely. Local bacterial infection is possible. (NOTE)       Sepsis PCT Algorithm           Lower Respiratory Tract                                      Infection PCT Algorithm    ----------------------------     ----------------------------         PCT < 0.25 ng/mL                PCT < 0.10 ng/mL          Strongly encourage             Strongly discourage   discontinuation of antibiotics    initiation of antibiotics    ----------------------------     -----------------------------       PCT 0.25 - 0.50 ng/mL            PCT 0.10 - 0.25 ng/mL               OR       >80% decrease in PCT            Discourage initiation of                                            antibiotics      Encourage discontinuation           of antibiotics    ----------------------------     -----------------------------         PCT >= 0.50 ng/mL              PCT 0.26 - 0.50 ng/mL               AND        <80% decrease in PCT             Encourage initiation of                                             antibiotics       Encourage continuation           of antibiotics    ----------------------------     -----------------------------        PCT >= 0.50 ng/mL  PCT > 0.50 ng/mL               AND         increase in PCT                  Strongly encourage                                      initiation of antibiotics     Strongly encourage escalation           of antibiotics                                     -----------------------------                                           PCT <= 0.25 ng/mL                                                 OR                                        > 80% decrease in PCT                                      Discontinue / Do not initiate                                             antibiotics  Performed at [facility], [address] [place], [place] [address]   Phosphorus     Status: Abnormal   Collection Time: 10/11/20  5:32 AM  Result Value Ref Range   Phosphorus 1.8 (L) 2.5 - 4.6 mg/dL    Comment: Performed at [facility], [address] [place], [place] [address]  Triglycerides     Status: None   Collection Time: 10/11/20  5:32 AM  Result Value Ref Range   Triglycerides 65 <150 mg/dL    Comment: Performed at [facility], [address] [place], [place] [address]  Glucose, capillary     Status: None   Collection Time: 10/11/20  7:48 AM  Result Value Ref Range   Glucose-Capillary 95 70 - 99 mg/dL    Comment: Glucose reference range applies only to samples taken after fasting for at least 8 hours.    Recent Results (from the past 240 hour(s))  Resp Panel by RT-PCR (Flu A&B, Covid) Nasopharyngeal Swab     Status: None   Collection Time: 10/08/20  3:02 PM   Specimen: Nasopharyngeal Swab; Nasopharyngeal(NP) swabs in vial transport medium  Result Value Ref Range Status   SARS Coronavirus 2 by RT PCR NEGATIVE NEGATIVE Final    Comment: (NOTE) SARS-CoV-2 target nucleic acids are NOT DETECTED.  The SARS-CoV-2  RNA is generally detectable in upper respiratory specimens during the acute phase of infection. The lowest concentration of SARS-CoV-2 viral copies this assay can detect is 138 copies/mL. A negative result does not preclude SARS-Cov-2 infection and should not be used as the sole basis for treatment or other patient  management decisions. A negative result may occur with  improper specimen collection/handling, submission of specimen other than nasopharyngeal swab, presence of viral mutation(s) within the areas targeted by this assay, and inadequate number of viral copies(<138 copies/mL). A negative result must be combined with clinical observations, patient history, and epidemiological information. The expected result is Negative.  Fact Sheet for Patients:  EntrepreneurPulse.com.au  Fact Sheet for Healthcare Providers:  IncredibleEmployment.be  This test is no t yet approved or cleared by the [place] FDA and  has been authorized for detection and/or diagnosis of SARS-CoV-2 by FDA under an Emergency Use Authorization (EUA). This EUA will remain  in effect (meaning this test can be used) for the duration of the COVID-19 declaration under Section 564(b)(1) of the Act, 21 U.S.C.section 360bbb-3(b)(1), unless the authorization is terminated  or revoked sooner.       Influenza A by PCR NEGATIVE NEGATIVE Final   Influenza B by PCR NEGATIVE NEGATIVE Final    Comment: (NOTE) The Xpert Xpress SARS-CoV-2/FLU/RSV plus assay is intended as an aid in the diagnosis of influenza from Nasopharyngeal swab specimens and should not be used as a sole basis for treatment. Nasal washings and aspirates are unacceptable for Xpert Xpress SARS-CoV-2/FLU/RSV testing.  Fact Sheet for Patients: EntrepreneurPulse.com.au  Fact Sheet for Healthcare Providers: IncredibleEmployment.be  This test is not yet approved or cleared by the [place] FDA and has been authorized for detection and/or diagnosis of SARS-CoV-2 by FDA under an Emergency Use Authorization (EUA). This EUA will remain in effect (meaning this test can be used) for the duration of the COVID-19 declaration under Section 564(b)(1) of the Act, 21 U.S.C. section 360bbb-3(b)(1),  unless the authorization is terminated or revoked.  Performed at [facility], [address] [place], [place] [address]   MRSA Next Gen by PCR, Nasal     Status: None   Collection Time: 10/08/20  8:41 PM   Specimen: Nasal Mucosa; Nasal Swab  Result Value Ref Range Status   MRSA by PCR Next Gen NOT DETECTED NOT DETECTED Final    Comment: (NOTE) The GeneXpert MRSA Assay (FDA approved for NASAL specimens only), is one component of a comprehensive MRSA colonization surveillance program. It is not intended to diagnose MRSA infection nor to guide or monitor treatment for MRSA infections. Test performance is not FDA approved in patients less than 26 years old. Performed at [facility], [address], [place], [place] [address]   Urine Culture     Status: None   Collection Time: 10/09/20  5:10 AM   Specimen: Urine, Catheterized  Result Value Ref Range Status   Specimen Description   Final    URINE, CATHETERIZED Performed at [facility], [address] [place], [place] [address]    Special Requests   Final    NONE Performed at [facility], [address], [place], [place] [address]    Culture   Final    NO GROWTH Performed at [facility], [place] [place], [place] [address]    Report Status 10/10/2020 FINAL  Final  Culture, Respiratory w Gram Stain     Status: None   Collection Time: 10/09/20  5:22 AM   Specimen: Tracheal Aspirate; Respiratory  Result Value Ref Range Status   Specimen Description   Final    TRACHEAL ASPIRATE Performed at [facility], [address], [place], [place] [address]    Special Requests   Final    NONE Performed at [facility], [address] [place], [place] [address]    Gram Stain   Final    WBC PRESENT, PREDOMINANTLY PMN FEW SQUAMOUS EPITHELIAL CELLS PRESENT FEW GRAM POSITIVE COCCI RARE GRAM NEGATIVE RODS    Culture   Final    RARE Consistent with normal  respiratory flora. No Pseudomonas species isolated Performed at [facility] [address] [place], [place] [address]    Report Status 10/11/2020 FINAL  Final  CULTURE, BLOOD (ROUTINE X 2) w Reflex to ID Panel     Status: None (Preliminary result)   Collection Time: 10/09/20  8:50 AM   Specimen: BLOOD RIGHT HAND  Result Value Ref Range Status   Specimen Description BLOOD RIGHT HAND  Final   Special Requests   Final    BOTTLES DRAWN AEROBIC ONLY Blood Culture results may not be optimal due to an inadequate volume of blood received in culture bottles   Culture   Final    NO GROWTH 2 DAYS Performed at [facility], [address] [place], [place] [address]    Report Status PENDING  Incomplete  CULTURE, BLOOD (ROUTINE X 2) w Reflex to ID Panel     Status: None (Preliminary result)   Collection Time: 10/09/20  8:50 AM   Specimen: BLOOD LEFT HAND  Result Value Ref Range Status   Specimen Description BLOOD LEFT HAND  Final   Special Requests   Final    BOTTLES DRAWN AEROBIC ONLY Blood Culture adequate volume   Culture   Final    NO GROWTH 2 DAYS Performed at [facility], [address], [place], [place] [address]    Report Status PENDING  Incomplete    Lipid Panel Recent Labs    10/11/20 0532  TRIG 65    Studies/Results: DG Abd 1 View  Result Date: 10/11/2020 CLINICAL DATA:  Check gastric catheter placement EXAM: ABDOMEN - 1 VIEW COMPARISON:  None. FINDINGS: Gastric catheter is noted in the distal stomach. Scattered large and small bowel gas is seen. No obstructive changes are noted. Degenerative change of the lumbar spine is seen. IMPRESSION: Gastric catheter deep within the stomach. Electronically Signed   By: Inez Catalina M.D.   On: 10/11/2020 00:32   MR BRAIN W WO CONTRAST  Result Date: 10/09/2020 CLINICAL DATA:  Seizure.  Abnormal neurological exam. EXAM: MRI HEAD WITHOUT AND WITH CONTRAST TECHNIQUE: Multiplanar, multiecho pulse sequences of  the brain and surrounding structures were obtained without and with intravenous contrast. CONTRAST:  7.90m GADAVIST GADOBUTROL 1 MMOL/ML IV SOLN COMPARISON:  Head CT yesterday. FINDINGS: Brain: Diffusion imaging does not show any acute or subacute infarction. Chronic small-vessel ischemic changes affect the pons. No focal cerebellar finding. Cerebral hemispheres show moderate to severe chronic small-vessel ischemic changes the white matter. No cortical or large vessel territory infarction. No mass lesion, hemorrhage, hydrocephalus or extra-axial collection. No cavernous sinus lesion as question by CT. Mesial temporal lobes appear symmetric and normal. After contrast administration, no abnormal enhancement occurs. Vascular: Major vessels at the base of the brain show flow. Skull and upper cervical spine: Negative Sinuses/Orbits: Clear except for a few retention cysts of the maxillary sinuses and  few opacified air cells in the left posterior ethmoid region. Fluid in the nasopharynx, often seen during endotracheal intubation. Other: None IMPRESSION: No acute intracranial finding. 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 No abnormality the cavernous sinus as was questioned by CT. Electronically Signed   By: Nelson Chimes M.D.   On: 10/09/2020 12:43   DG Chest Port 1 View  Result Date: 10/11/2020 CLINICAL DATA:  Acute respiratory failure EXAM: PORTABLE CHEST 1 VIEW COMPARISON:  10/09/2020 FINDINGS: Cardiac shadow is enlarged but stable. Endotracheal tube, gastric catheter and right-sided PICC line are seen in satisfactory position. Lungs are well aerated bilaterally. No focal confluent infiltrate is seen. Minimal right basilar atelectasis is noted. IMPRESSION: Tubes and lines as described. Minimal right basilar atelectasis. Electronically Signed   By: Inez Catalina M.D.   On: 10/11/2020 00:32   EEG adult  Result Date: 10/09/2020 Lora Havens, MD      10/09/2020  9:58 PM Patient Name: BRYNDEN THUNE MRN: [id number] Epilepsy Attending: Lora Havens Referring Physician/Provider: Darel Hong, NP Date: 10/09/2020 Duration: 31 mins Patient history: 70 year old man status post cardiac arrest.  EEG to evaluate for seizures. Level of alertness: comatose AEDs during EEG study: LEV, versed Technical aspects: This EEG study was done with scalp electrodes positioned according to the 10-20 International system of electrode placement. Electrical activity was acquired at a sampling rate of _0  and reviewed with a high frequency filter of _1  and a low frequency filter of _2 . EEG data were recorded continuously and digitally stored. Description: EEG showed continuous generalized 3 to 6 Hz theta-delta slowing. Hyperventilation and photic stimulation were not performed.   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 No seizures or epileptiform discharges were seen throughout the recording. Lora Havens   ECHOCARDIOGRAM COMPLETE  Result Date: 10/09/2020    ECHOCARDIOGRAM REPORT   Patient Name:   [name] Date of Exam: 10/09/2020 Medical Rec #:  [id number]       Height:       75.0 in Accession #:    [id number]      Weight:       187.8 lb Date of Birth:  Jun 25, 1950       BSA:          2.137 m Patient Age:    44 years        BP:           120/88 mmHg Patient Gender: M               HR:           70 bpm. Exam Location:  [facility] Procedure: 2D Echo, Color Doppler, Cardiac Doppler and Strain Analysis Indications:     I46.9 Cardiac arrest  History:         Patient has prior history of Echocardiogram examinations, most                  recent 07/29/2020. CHF, CAD; Risk Factors:Hypertension.  Sonographer:     Charmayne Sheer Referring Phys:  [id number] Bradly Bienenstock Diagnosing Phys: Kate Sable MD  Sonographer Comments: Suboptimal subcostal window and echo performed with  patient supine and on artificial respirator. Global longitudinal strain was attempted. IMPRESSIONS  1. Left ventricular ejection fraction, by estimation, is 25 to 30%. The left ventricle has severely decreased function. The left ventricle demonstrates global hypokinesis. Left ventricular  diastolic parameters are consistent with Grade I diastolic dysfunction (impaired relaxation).  2. Right ventricular systolic function is moderately reduced. The right ventricular size is normal.  3. The mitral valve is normal in structure. No evidence of mitral valve regurgitation.  4. The aortic valve is tricuspid. Aortic valve regurgitation is not visualized.  5. Aortic dilatation noted. There is mild dilatation of the aortic root, measuring 39 mm.  6. The inferior vena cava is normal in size with greater than 50% respiratory variability, suggesting right atrial pressure of 3 mmHg. FINDINGS  Left Ventricle: Left ventricular ejection fraction, by estimation, is 25 to 30%. The left ventricle has severely decreased function. The left ventricle demonstrates global hypokinesis. 3D left ventricular ejection fraction analysis performed but not reported based on interpreter judgement due to suboptimal quality. The left ventricular internal cavity size was normal in size. There is no left ventricular hypertrophy. Left ventricular diastolic parameters are consistent with Grade I diastolic dysfunction (impaired relaxation). Right Ventricle: The right ventricular size is normal. No increase in right ventricular wall thickness. Right ventricular systolic function is moderately reduced. Left Atrium: Left atrial size was normal in size. Right Atrium: Right atrial size was normal in size. Pericardium: There is no evidence of pericardial effusion. Mitral Valve: The mitral valve is normal in structure. No evidence of mitral valve regurgitation. MV peak gradient, 3.6 mmHg. The mean mitral valve gradient is 1.0 mmHg. Tricuspid Valve: The tricuspid  valve is normal in structure. Tricuspid valve regurgitation is not demonstrated. Aortic Valve: The aortic valve is tricuspid. Aortic valve regurgitation is not visualized. Aortic valve mean gradient measures 5.0 mmHg. Aortic valve peak gradient measures 9.5 mmHg. Aortic valve area, by VTI measures 2.91 cm. Pulmonic Valve: The pulmonic valve was not well visualized. Pulmonic valve regurgitation is not visualized. Aorta: Aortic dilatation noted. There is mild dilatation of the aortic root, measuring 39 mm. Venous: The inferior vena cava is normal in size with greater than 50% respiratory variability, suggesting right atrial pressure of 3 mmHg. IAS/Shunts: No atrial level shunt detected by color flow Doppler.  LEFT VENTRICLE PLAX 2D LVIDd:         5.99 cm      Diastology LVIDs:         4.74 cm      LV e' medial:    4.57 cm/s LV PW:         1.29 cm      LV E/e' medial:  10.7 LV IVS:        0.85 cm      LV e' lateral:   5.55 cm/s LVOT diam:     2.60 cm      LV E/e' lateral: 8.8 LV SV:         77 LV SV Index:   36           2D Longitudinal Strain LVOT Area:     5.31 cm     2D Strain GLS Avg:     -9.3 %  LV Volumes (MOD) LV vol d, MOD A2C: 192.0 ml 3D Volume EF: LV vol d, MOD A4C: 163.0 ml 3D EF:        40 % LV vol s, MOD A2C: 110.0 ml LV EDV:       241 ml LV vol s, MOD A4C: 84.4 ml  LV ESV:       144 ml LV SV MOD A2C:     82.0 ml  LV SV:  97 ml LV SV MOD A4C:     163.0 ml LV SV MOD BP:      78.9 ml RIGHT VENTRICLE RV Basal diam:  3.41 cm RV S prime:     13.20 cm/s LEFT ATRIUM             Index       RIGHT ATRIUM           Index LA diam:        3.50 cm 1.64 cm/m  RA Area:     15.40 cm LA Vol (A2C):   37.3 ml 17.46 ml/m RA Volume:   36.00 ml  16.85 ml/m LA Vol (A4C):   40.5 ml 18.96 ml/m LA Biplane Vol: 40.3 ml 18.86 ml/m  AORTIC VALVE                    PULMONIC VALVE AV Area (Vmax):    3.48 cm     PV Vmax:       0.91 m/s AV Area (Vmean):   3.37 cm     PV Vmean:      59.800 cm/s AV Area (VTI):     2.91 cm      PV VTI:        0.153 m AV Vmax:           154.00 cm/s  PV Peak grad:  3.3 mmHg AV Vmean:          103.000 cm/s PV Mean grad:  2.0 mmHg AV VTI:            0.265 m AV Peak Grad:      9.5 mmHg AV Mean Grad:      5.0 mmHg LVOT Vmax:         101.00 cm/s LVOT Vmean:        65.400 cm/s LVOT VTI:          0.145 m LVOT/AV VTI ratio: 0.55  AORTA Ao Root diam: 3.90 cm MITRAL VALVE MV Area (PHT): 3.36 cm    SHUNTS MV Area VTI:   3.39 cm    Systemic VTI:  0.14 m MV Peak grad:  3.6 mmHg    Systemic Diam: 2.60 cm MV Mean grad:  1.0 mmHg MV Vmax:       0.95 m/s MV Vmean:      54.1 cm/s MV Decel Time: 226 msec MV E velocity: 48.80 cm/s MV A velocity: 83.10 cm/s MV E/A ratio:  0.59 Kate Sable MD Electronically signed by Kate Sable MD Signature Date/Time: 10/09/2020/3:50:52 PM    Final    [place] EKG SITE RITE  Result Date: 10/10/2020 If Site Rite image not attached, placement could not be confirmed due to current cardiac rhythm.   Medications: Scheduled:  aspirin  81 mg Per Tube Daily   chlorhexidine gluconate (MEDLINE KIT)  15 mL Mouth Rinse BID   Chlorhexidine Gluconate Cloth  6 each Topical Q0600   docusate  100 mg Per Tube BID   famotidine  20 mg Per Tube BID   feeding supplement (PROSource TF)  45 mL Per Tube BID   free water  30 mL Per Tube Q4H   heparin injection (subcutaneous)  5,000 Units Subcutaneous Q8H   mouth rinse  15 mL Mouth Rinse 10 times per day   polyethylene glycol  17 g Per Tube Daily   sodium chloride flush  10-40 mL Intracatheter Q12H   Continuous:  sodium chloride 10 mL/hr at 10/09/20 2232  cefTRIAXone (ROCEPHIN)  IV Stopped (10/11/20 0028)   dextrose 50 mL/hr at 10/11/20 0109   feeding supplement (VITAL 1.5 CAL) 1,000 mL (10/10/20 0920)   fentaNYL infusion INTRAVENOUS Stopped (10/10/20 1707)   levETIRAcetam Stopped (10/10/20 2237)   metronidazole 500 mg (10/11/20 0507)   norepinephrine (LEVOPHED) Adult infusion Stopped (10/09/20 2240)   propofol (DIPRIVAN) infusion 10  mcg/kg/min (10/11/20 7517)     Assessment: 70 year old male s/p cardiac arrest requiring 30 minutes of resuscitation efforts prior to ROSC. Possible anoxic brain injury. Twitching noted while the patient was still in the ED.  1. Exam significantly improved relative to yesterday. Now awake and able to wiggle toes to command.  2. MRI brain: No acute intracranial finding. 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 No abnormality of the cavernous sinus as was questioned by CT. 3. History of seizures per documentation. Twitching in the ED in the setting of possible anoxic brain injury. No jerking, twitching or other seizure-like activity noted during today's examination.  4. EEG (8/12): Generalized 3 to 6 Hz theta-delta slowing. The findings are suggestive of moderate to severe diffuse encephalopathy, nonspecific to etiology. No seizures or epileptiform discharges were seen throughout the recording. 5. Chances for a meaningful neurological recovery significantly improved based on exam findings today.    Recommendations: 1. Continue Keppra 500 mg IV BID. Switch to PO when able and continue at discharge.  2. Wean off vent and extubate when able, followed by OOB to chair as tolerated.  3. PT/OT/Speech after he is extubated.  4. Will need outpatient Neurology follow up. 5. Neurohospitalist service will sign off. Please call if there are additional questions.    35 minutes spent in the neurological evaluation and management of this critically ill patient.    LOS: 3 days   _0  signed: Dr. Kerney Elbe 10/11/2020  9:13 AM

## 2020-10-12 DIAGNOSIS — I469 Cardiac arrest, cause unspecified: Secondary | ICD-10-CM | POA: Diagnosis not present

## 2020-10-12 DIAGNOSIS — I5043 Acute on chronic combined systolic (congestive) and diastolic (congestive) heart failure: Secondary | ICD-10-CM | POA: Diagnosis not present

## 2020-10-12 DIAGNOSIS — I5021 Acute systolic (congestive) heart failure: Secondary | ICD-10-CM | POA: Diagnosis not present

## 2020-10-12 LAB — GLUCOSE, CAPILLARY
Glucose-Capillary: 102 mg/dL — ABNORMAL HIGH (ref 70–99)
Glucose-Capillary: 119 mg/dL — ABNORMAL HIGH (ref 70–99)
Glucose-Capillary: 119 mg/dL — ABNORMAL HIGH (ref 70–99)
Glucose-Capillary: 120 mg/dL — ABNORMAL HIGH (ref 70–99)
Glucose-Capillary: 125 mg/dL — ABNORMAL HIGH (ref 70–99)
Glucose-Capillary: 138 mg/dL — ABNORMAL HIGH (ref 70–99)

## 2020-10-12 LAB — CBC WITH DIFFERENTIAL/PLATELET
Abs Immature Granulocytes: 0.02 10*3/uL (ref 0.00–0.07)
Basophils Absolute: 0 10*3/uL (ref 0.0–0.1)
Basophils Relative: 0 %
Eosinophils Absolute: 0.1 10*3/uL (ref 0.0–0.5)
Eosinophils Relative: 2 %
HCT: 36.7 % — ABNORMAL LOW (ref 39.0–52.0)
Hemoglobin: 12.3 g/dL — ABNORMAL LOW (ref 13.0–17.0)
Immature Granulocytes: 0 %
Lymphocytes Relative: 17 %
Lymphs Abs: 1.2 10*3/uL (ref 0.7–4.0)
MCH: 30.3 pg (ref 26.0–34.0)
MCHC: 33.5 g/dL (ref 30.0–36.0)
MCV: 90.4 fL (ref 80.0–100.0)
Monocytes Absolute: 0.6 10*3/uL (ref 0.1–1.0)
Monocytes Relative: 8 %
Neutro Abs: 5.1 10*3/uL (ref 1.7–7.7)
Neutrophils Relative %: 73 %
Platelets: 104 10*3/uL — ABNORMAL LOW (ref 150–400)
RBC: 4.06 MIL/uL — ABNORMAL LOW (ref 4.22–5.81)
RDW: 14.1 % (ref 11.5–15.5)
WBC: 7.1 10*3/uL (ref 4.0–10.5)
nRBC: 0 % (ref 0.0–0.2)

## 2020-10-12 LAB — BASIC METABOLIC PANEL
Anion gap: 4 — ABNORMAL LOW (ref 5–15)
BUN: 28 mg/dL — ABNORMAL HIGH (ref 8–23)
CO2: 29 mmol/L (ref 22–32)
Calcium: 7.9 mg/dL — ABNORMAL LOW (ref 8.9–10.3)
Chloride: 106 mmol/L (ref 98–111)
Creatinine, Ser: 1.88 mg/dL — ABNORMAL HIGH (ref 0.61–1.24)
GFR, Estimated: 39 mL/min — ABNORMAL LOW (ref >60)
Glucose, Bld: 115 mg/dL — ABNORMAL HIGH (ref 70–99)
Potassium: 3.6 mmol/L (ref 3.5–5.1)
Sodium: 139 mmol/L (ref 135–145)

## 2020-10-12 LAB — PROCALCITONIN: Procalcitonin: 0.13 ng/mL

## 2020-10-12 LAB — TRIGLYCERIDES: Triglycerides: 117 mg/dL (ref <150)

## 2020-10-12 LAB — MAGNESIUM: Magnesium: 1.7 mg/dL (ref 1.7–2.4)

## 2020-10-12 LAB — PHOSPHORUS: Phosphorus: 2.4 mg/dL — ABNORMAL LOW (ref 2.5–4.6)

## 2020-10-12 LAB — ACTH STIMULATION, 3 TIME POINTS
Cortisol, 30 Min: 30.1 ug/dL
Cortisol, 60 Min: 37.5 ug/dL
Cortisol, Base: 30.3 ug/dL

## 2020-10-12 MED ORDER — POTASSIUM & SODIUM PHOSPHATES 280-160-250 MG PO PACK
2.0000 | PACK | ORAL | Status: AC
  Administered 2020-10-12 (×2): 2
  Filled 2020-10-12 (×2): qty 2

## 2020-10-12 MED ORDER — GLYCOPYRROLATE 0.2 MG/ML IJ SOLN
0.2000 mg | INTRAMUSCULAR | Status: DC | PRN
  Administered 2020-10-12 – 2020-10-13 (×3): 0.2 mg via INTRAVENOUS
  Filled 2020-10-12 (×2): qty 1

## 2020-10-12 MED ORDER — DEXMEDETOMIDINE HCL IN NACL 400 MCG/100ML IV SOLN
0.4000 ug/kg/h | INTRAVENOUS | Status: DC
  Administered 2020-10-12: 1 ug/kg/h via INTRAVENOUS
  Filled 2020-10-12: qty 100

## 2020-10-12 MED ORDER — MIDAZOLAM HCL 2 MG/2ML IJ SOLN
2.0000 mg | Freq: Once | INTRAMUSCULAR | Status: AC
  Administered 2020-10-12: 2 mg via INTRAVENOUS
  Filled 2020-10-12: qty 2

## 2020-10-12 MED ORDER — IPRATROPIUM-ALBUTEROL 0.5-2.5 (3) MG/3ML IN SOLN
3.0000 mL | RESPIRATORY_TRACT | Status: DC
  Administered 2020-10-12 – 2020-10-14 (×12): 3 mL via RESPIRATORY_TRACT
  Filled 2020-10-12 (×12): qty 3

## 2020-10-12 MED ORDER — GLYCOPYRROLATE 0.2 MG/ML IJ SOLN
0.3000 mg | INTRAMUSCULAR | Status: DC | PRN
  Filled 2020-10-12: qty 2

## 2020-10-12 MED ORDER — CARVEDILOL 6.25 MG PO TABS
6.2500 mg | ORAL_TABLET | Freq: Two times a day (BID) | ORAL | Status: DC
  Administered 2020-10-12 – 2020-10-14 (×3): 6.25 mg
  Filled 2020-10-12 (×3): qty 1

## 2020-10-12 MED ORDER — METHYLPREDNISOLONE SODIUM SUCC 40 MG IJ SOLR
40.0000 mg | Freq: Two times a day (BID) | INTRAMUSCULAR | Status: DC
  Administered 2020-10-12 – 2020-10-14 (×5): 40 mg via INTRAVENOUS
  Filled 2020-10-12 (×6): qty 1

## 2020-10-12 MED ORDER — HYDRALAZINE HCL 20 MG/ML IJ SOLN
10.0000 mg | Freq: Four times a day (QID) | INTRAMUSCULAR | Status: DC | PRN
  Administered 2020-10-12: 10 mg via INTRAVENOUS
  Filled 2020-10-12: qty 1

## 2020-10-12 MED ORDER — CHLORHEXIDINE GLUCONATE CLOTH 2 % EX PADS: 6.0000 | MEDICATED_PAD | Freq: Every day | CUTANEOUS | Status: DC

## 2020-10-12 MED ORDER — VITAL 1.5 CAL PO LIQD
1000.0000 mL | ORAL | Status: DC
  Administered 2020-10-12: 1000 mL

## 2020-10-12 MED ORDER — CARVEDILOL 6.25 MG PO TABS: 6.2500 mg | ORAL_TABLET | Freq: Two times a day (BID) | ORAL | Status: DC

## 2020-10-12 MED ORDER — BUDESONIDE 0.5 MG/2ML IN SUSP
0.5000 mg | Freq: Two times a day (BID) | RESPIRATORY_TRACT | Status: DC
  Administered 2020-10-12 – 2020-10-14 (×4): 0.5 mg via RESPIRATORY_TRACT
  Filled 2020-10-12 (×5): qty 2
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
## 2020-10-12 NOTE — Progress Notes (Signed)
PHARMACY CONSULT NOTE - FOLLOW UP  Pharmacy Consult for Electrolyte Monitoring and Replacement   Recent Labs: Potassium (mmol/L)  Date Value  10/12/2020 3.6   Magnesium (mg/dL)  Date Value  10/12/2020 1.7   Calcium (mg/dL)  Date Value  10/12/2020 7.9 (L)   Albumin (g/dL)  Date Value  10/11/2020 3.0 (L)  08/18/2017 4.0   Phosphorus (mg/dL)  Date Value  10/12/2020 2.4 (L)   Sodium (mmol/L)  Date Value  10/12/2020 139  07/24/2020 145 (H)     Assessment: 70 year old male with out of hospital cardiac arrest due to ventricular fibrillation/tachycardia. Patient remains in the ICU on mechanical ventilation. Pharmacy to manage electrolytes.  Goal of Therapy:  Electrolytes WNL  Plan:  --Phosphorus slightly low today --Phos NaK 2 packets per tube x 2 doses --Continue to follow electrolytes  Tawnya Crook ,PharmD, BCPS Clinical Pharmacist 10/12/2020 11:39 AM
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
## 2020-10-13 DIAGNOSIS — J9601 Acute respiratory failure with hypoxia: Secondary | ICD-10-CM | POA: Diagnosis not present

## 2020-10-13 DIAGNOSIS — I5021 Acute systolic (congestive) heart failure: Secondary | ICD-10-CM | POA: Diagnosis not present

## 2020-10-13 LAB — BASIC METABOLIC PANEL
Anion gap: 8 (ref 5–15)
BUN: 32 mg/dL — ABNORMAL HIGH (ref 8–23)
CO2: 24 mmol/L (ref 22–32)
Calcium: 8.1 mg/dL — ABNORMAL LOW (ref 8.9–10.3)
Chloride: 105 mmol/L (ref 98–111)
Creatinine, Ser: 1.77 mg/dL — ABNORMAL HIGH (ref 0.61–1.24)
GFR, Estimated: 42 mL/min — ABNORMAL LOW (ref >60)
Glucose, Bld: 156 mg/dL — ABNORMAL HIGH (ref 70–99)
Potassium: 4 mmol/L (ref 3.5–5.1)
Sodium: 137 mmol/L (ref 135–145)

## 2020-10-13 LAB — CBC
HCT: 36 % — ABNORMAL LOW (ref 39.0–52.0)
Hemoglobin: 12.4 g/dL — ABNORMAL LOW (ref 13.0–17.0)
MCH: 30.8 pg (ref 26.0–34.0)
MCHC: 34.4 g/dL (ref 30.0–36.0)
MCV: 89.6 fL (ref 80.0–100.0)
Platelets: 114 10*3/uL — ABNORMAL LOW (ref 150–400)
RBC: 4.02 MIL/uL — ABNORMAL LOW (ref 4.22–5.81)
RDW: 14.2 % (ref 11.5–15.5)
WBC: 7.6 10*3/uL (ref 4.0–10.5)
nRBC: 0 % (ref 0.0–0.2)

## 2020-10-13 LAB — GLUCOSE, CAPILLARY
Glucose-Capillary: 116 mg/dL — ABNORMAL HIGH (ref 70–99)
Glucose-Capillary: 131 mg/dL — ABNORMAL HIGH (ref 70–99)
Glucose-Capillary: 111 mg/dL — ABNORMAL HIGH (ref 70–99)
Glucose-Capillary: 92 mg/dL (ref 70–99)
Glucose-Capillary: 112 mg/dL — ABNORMAL HIGH (ref 70–99)

## 2020-10-13 LAB — PHOSPHORUS: Phosphorus: 3.4 mg/dL (ref 2.5–4.6)

## 2020-10-13 LAB — TRIGLYCERIDES: Triglycerides: 481 mg/dL — ABNORMAL HIGH (ref <150)

## 2020-10-13 LAB — MAGNESIUM: Magnesium: 1.9 mg/dL (ref 1.7–2.4)

## 2020-10-13 MED ORDER — TRAZODONE HCL 100 MG PO TABS
100.0000 mg | ORAL_TABLET | Freq: Every evening | ORAL | Status: DC | PRN
  Administered 2020-10-13: 100 mg via ORAL
  Filled 2020-10-13: qty 1

## 2020-10-13 MED ORDER — DIAZEPAM 5 MG/ML IJ SOLN
2.5000 mg | INTRAMUSCULAR | Status: DC | PRN
  Administered 2020-10-13: 2.5 mg via INTRAVENOUS
  Filled 2020-10-13: qty 2
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
## 2020-10-13 NOTE — Procedures (Signed)
Extubation Procedure Note  Patient Details:   Name: Jonathon Rios DOB: September 18, 1950 MRN: [id number]   Airway Documentation:    Vent end date: 10/13/20 Vent end time: 0923   Evaluation  O2 sats: stable throughout Complications: No apparent complications Patient did tolerate procedure well. Bilateral Breath Sounds: Rhonchi   No   Patient extubated per NP order.  Cuff leak present prior to extubation.  Patient unable to follow commands pre and post extuation. Jonathon Rios 10/13/2020, 9:30 AM
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
## 2020-10-13 NOTE — Progress Notes (Signed)
Chart reviewed, Pt visited. Nsg present in the room. Pt was intubated 5 days, extubated earlier today. Starting to awaken more (still groggy) and follow some commands. Given the extended intubation and extubation just today, Will hold off on swallowing assessment until tomorrow am in hopes of further improvement. Nsg in agreement.

## 2020-10-13 NOTE — Progress Notes (Signed)
GOALS OF CARE DISCUSSION  The Clinical status was relayed to family in detail. Daughter at Bedside  Updated and notified of patients medical condition.    Patient remains unresponsive and will not open eyes to command.    Patient with increased WOB and using accessory muscles to breathe on vent Explained to family course of therapy and the modalities    Patient with Progressive multiorgan failure with a very high probablity of a very minimal chance of meaningful recovery despite all aggressive and optimal medical therapy.  PATIENT REMAINS DNR  Will plan for SAT/SBT and trial of extubation-patient extubated to RA, nonverbal, lethargic Will monitor very closely    Family understands the situation.   Family are satisfied with Plan of action and management. All questions answered  Additional CC time 23 mins   Angelus Hoopes Patricia Pesa, M.D.  [facility]  Medical Director [place] Director [facility] Cardio-Pulmonary Department

## 2020-10-13 NOTE — Progress Notes (Signed)
PHARMACY CONSULT NOTE - FOLLOW UP  Pharmacy Consult for Electrolyte Monitoring and Replacement   Recent Labs: Potassium (mmol/L)  Date Value  10/13/2020 4.0   Magnesium (mg/dL)  Date Value  10/13/2020 1.9   Calcium (mg/dL)  Date Value  10/13/2020 8.1 (L)   Albumin (g/dL)  Date Value  10/11/2020 3.0 (L)  08/18/2017 4.0   Phosphorus (mg/dL)  Date Value  10/13/2020 3.4   Sodium (mmol/L)  Date Value  10/13/2020 137  07/24/2020 145 (H)     Assessment: 70 year old male with out of hospital cardiac arrest due to ventricular fibrillation/tachycardia. Patient admitted to the ICU on mechanical ventilation. Patient extubated 8/16. Pharmacy to manage electrolytes.  Goal of Therapy:  Electrolytes WNL  Plan:  --No replacement indicated today --Continue to follow electrolytes  Tawnya Crook ,PharmD, BCPS Clinical Pharmacist 10/13/2020 11:43 AM

## 2020-10-14 ENCOUNTER — Inpatient Hospital Stay (HOSPITAL_COMMUNITY)
Admission: AD | Admit: 2020-10-14 | Discharge: 2020-10-16 | DRG: 227 | Disposition: A | Discharge status: Home / Self Care | Payer: Medicare Other | Source: Other Acute Inpatient Hospital | Attending: Internal Medicine | Admitting: Internal Medicine

## 2020-10-14 DIAGNOSIS — J449 Chronic obstructive pulmonary disease, unspecified: Secondary | ICD-10-CM | POA: Diagnosis not present

## 2020-10-14 DIAGNOSIS — I10 Essential (primary) hypertension: Secondary | ICD-10-CM | POA: Diagnosis not present

## 2020-10-14 DIAGNOSIS — I2510 Atherosclerotic heart disease of native coronary artery without angina pectoris: Secondary | ICD-10-CM | POA: Diagnosis not present

## 2020-10-14 DIAGNOSIS — I130 Hypertensive heart and chronic kidney disease with heart failure and stage 1 through stage 4 chronic kidney disease, or unspecified chronic kidney disease: Secondary | ICD-10-CM | POA: Diagnosis present

## 2020-10-14 DIAGNOSIS — Z825 Family history of asthma and other chronic lower respiratory diseases: Secondary | ICD-10-CM | POA: Diagnosis not present

## 2020-10-14 DIAGNOSIS — Z95810 Presence of automatic (implantable) cardiac defibrillator: Secondary | ICD-10-CM

## 2020-10-14 DIAGNOSIS — Z833 Family history of diabetes mellitus: Secondary | ICD-10-CM

## 2020-10-14 DIAGNOSIS — F17210 Nicotine dependence, cigarettes, uncomplicated: Secondary | ICD-10-CM | POA: Diagnosis not present

## 2020-10-14 DIAGNOSIS — I469 Cardiac arrest, cause unspecified: Secondary | ICD-10-CM | POA: Diagnosis present

## 2020-10-14 DIAGNOSIS — I428 Other cardiomyopathies: Secondary | ICD-10-CM | POA: Diagnosis not present

## 2020-10-14 DIAGNOSIS — J441 Chronic obstructive pulmonary disease with (acute) exacerbation: Secondary | ICD-10-CM | POA: Diagnosis present

## 2020-10-14 DIAGNOSIS — N1832 Chronic kidney disease, stage 3b: Secondary | ICD-10-CM | POA: Diagnosis present

## 2020-10-14 DIAGNOSIS — I5022 Chronic systolic (congestive) heart failure: Secondary | ICD-10-CM | POA: Diagnosis not present

## 2020-10-14 DIAGNOSIS — I5043 Acute on chronic combined systolic (congestive) and diastolic (congestive) heart failure: Secondary | ICD-10-CM

## 2020-10-14 DIAGNOSIS — I50811 Acute right heart failure: Secondary | ICD-10-CM

## 2020-10-14 DIAGNOSIS — N183 Chronic kidney disease, stage 3 unspecified: Secondary | ICD-10-CM

## 2020-10-14 DIAGNOSIS — Z8674 Personal history of sudden cardiac arrest: Secondary | ICD-10-CM | POA: Diagnosis not present

## 2020-10-14 DIAGNOSIS — D631 Anemia in chronic kidney disease: Secondary | ICD-10-CM | POA: Diagnosis not present

## 2020-10-14 DIAGNOSIS — Z83438 Family history of other disorder of lipoprotein metabolism and other lipidemia: Secondary | ICD-10-CM

## 2020-10-14 DIAGNOSIS — N1831 Chronic kidney disease, stage 3a: Secondary | ICD-10-CM | POA: Diagnosis not present

## 2020-10-14 DIAGNOSIS — F1011 Alcohol abuse, in remission: Secondary | ICD-10-CM | POA: Diagnosis present

## 2020-10-14 DIAGNOSIS — N189 Chronic kidney disease, unspecified: Secondary | ICD-10-CM | POA: Diagnosis not present

## 2020-10-14 DIAGNOSIS — N179 Acute kidney failure, unspecified: Secondary | ICD-10-CM | POA: Diagnosis not present

## 2020-10-14 DIAGNOSIS — Z452 Encounter for adjustment and management of vascular access device: Secondary | ICD-10-CM | POA: Diagnosis not present

## 2020-10-14 DIAGNOSIS — I272 Pulmonary hypertension, unspecified: Secondary | ICD-10-CM | POA: Diagnosis present

## 2020-10-14 DIAGNOSIS — R569 Unspecified convulsions: Secondary | ICD-10-CM | POA: Diagnosis not present

## 2020-10-14 DIAGNOSIS — Z8249 Family history of ischemic heart disease and other diseases of the circulatory system: Secondary | ICD-10-CM | POA: Diagnosis not present

## 2020-10-14 DIAGNOSIS — F1411 Cocaine abuse, in remission: Secondary | ICD-10-CM | POA: Diagnosis present

## 2020-10-14 DIAGNOSIS — I517 Cardiomegaly: Secondary | ICD-10-CM | POA: Diagnosis not present

## 2020-10-14 DIAGNOSIS — Z720 Tobacco use: Secondary | ICD-10-CM | POA: Diagnosis present

## 2020-10-14 LAB — BASIC METABOLIC PANEL
Anion gap: 10 (ref 5–15)
BUN: 45 mg/dL — ABNORMAL HIGH (ref 8–23)
CO2: 24 mmol/L (ref 22–32)
Calcium: 8.8 mg/dL — ABNORMAL LOW (ref 8.9–10.3)
Chloride: 105 mmol/L (ref 98–111)
Creatinine, Ser: 1.71 mg/dL — ABNORMAL HIGH (ref 0.61–1.24)
GFR, Estimated: 43 mL/min — ABNORMAL LOW (ref >60)
Glucose, Bld: 115 mg/dL — ABNORMAL HIGH (ref 70–99)
Potassium: 4.2 mmol/L (ref 3.5–5.1)
Sodium: 139 mmol/L (ref 135–145)

## 2020-10-14 LAB — CULTURE, BLOOD (ROUTINE X 2)
Culture: NO GROWTH
Culture: NO GROWTH
Special Requests: ADEQUATE

## 2020-10-14 LAB — CULTURE, RESPIRATORY W GRAM STAIN: Culture: NORMAL

## 2020-10-14 LAB — CBC
HCT: 36.6 % — ABNORMAL LOW (ref 39.0–52.0)
Hemoglobin: 12.8 g/dL — ABNORMAL LOW (ref 13.0–17.0)
MCH: 30.8 pg (ref 26.0–34.0)
MCHC: 35 g/dL (ref 30.0–36.0)
MCV: 88.2 fL (ref 80.0–100.0)
Platelets: 120 10*3/uL — ABNORMAL LOW (ref 150–400)
RBC: 4.15 MIL/uL — ABNORMAL LOW (ref 4.22–5.81)
RDW: 14.1 % (ref 11.5–15.5)
WBC: 9.1 10*3/uL (ref 4.0–10.5)
nRBC: 0 % (ref 0.0–0.2)

## 2020-10-14 LAB — GLUCOSE, CAPILLARY
Glucose-Capillary: 114 mg/dL — ABNORMAL HIGH (ref 70–99)
Glucose-Capillary: 81 mg/dL (ref 70–99)
Glucose-Capillary: 92 mg/dL (ref 70–99)

## 2020-10-14 MED ORDER — LEVETIRACETAM 500 MG PO TABS
500.0000 mg | ORAL_TABLET | Freq: Two times a day (BID) | ORAL | Status: DC
  Administered 2020-10-14 – 2020-10-16 (×4): 500 mg via ORAL
  Filled 2020-10-14 (×4): qty 1

## 2020-10-14 MED ORDER — IPRATROPIUM-ALBUTEROL 0.5-2.5 (3) MG/3ML IN SOLN
3.0000 mL | Freq: Three times a day (TID) | RESPIRATORY_TRACT | Status: DC
  Administered 2020-10-14: 3 mL via RESPIRATORY_TRACT
  Filled 2020-10-14 (×2): qty 3

## 2020-10-14 MED ORDER — ADULT MULTIVITAMIN W/MINERALS CH: 1.0000 | ORAL_TABLET | Freq: Every day | ORAL | Status: DC

## 2020-10-14 MED ORDER — CHLORHEXIDINE GLUCONATE CLOTH 2 % EX PADS
6.0000 | MEDICATED_PAD | Freq: Every day | CUTANEOUS | Status: DC
  Administered 2020-10-15: 6 via TOPICAL

## 2020-10-14 MED ORDER — ADULT MULTIVITAMIN W/MINERALS CH
1.0000 | ORAL_TABLET | Freq: Every day | ORAL | Status: DC
  Administered 2020-10-15 – 2020-10-16 (×2): 1 via ORAL
  Filled 2020-10-14 (×2): qty 1

## 2020-10-14 MED ORDER — GLYCOPYRROLATE 0.2 MG/ML IJ SOLN: 0.2000 mg | INTRAMUSCULAR | Status: DC | PRN

## 2020-10-14 MED ORDER — ENSURE ENLIVE PO LIQD
237.0000 mL | Freq: Three times a day (TID) | ORAL | Status: DC
  Administered 2020-10-14: 237 mL via ORAL

## 2020-10-14 MED ORDER — ALBUTEROL SULFATE HFA 108 (90 BASE) MCG/ACT IN AERS
2.0000 | INHALATION_SPRAY | Freq: Four times a day (QID) | RESPIRATORY_TRACT | Status: DC | PRN
  Filled 2020-10-14: qty 6.7

## 2020-10-14 MED ORDER — ASPIRIN EC 81 MG PO TBEC
81.0000 mg | DELAYED_RELEASE_TABLET | Freq: Every day | ORAL | Status: DC
  Administered 2020-10-15 – 2020-10-16 (×2): 81 mg via ORAL
  Filled 2020-10-14 (×2): qty 1

## 2020-10-14 MED ORDER — CARVEDILOL 6.25 MG PO TABS
6.2500 mg | ORAL_TABLET | Freq: Two times a day (BID) | ORAL | Status: DC
  Administered 2020-10-15 – 2020-10-16 (×3): 6.25 mg via ORAL
  Filled 2020-10-14 (×3): qty 1

## 2020-10-14 MED ORDER — TRAZODONE HCL 100 MG PO TABS: 100.0000 mg | ORAL_TABLET | Freq: Every evening | ORAL | Status: DC | PRN

## 2020-10-14 MED ORDER — LEVETIRACETAM 500 MG PO TABS
500.0000 mg | ORAL_TABLET | Freq: Two times a day (BID) | ORAL | Status: DC
  Filled 2020-10-14: qty 1

## 2020-10-14 MED ORDER — ENSURE ENLIVE PO LIQD
237.0000 mL | Freq: Three times a day (TID) | ORAL | Status: DC
  Administered 2020-10-14 – 2020-10-16 (×3): 237 mL via ORAL

## 2020-10-14 MED ORDER — ACETAMINOPHEN 325 MG PO TABS
650.0000 mg | ORAL_TABLET | Freq: Four times a day (QID) | ORAL | Status: DC | PRN
  Administered 2020-10-15: 650 mg via ORAL
  Filled 2020-10-14: qty 2

## 2020-10-14 MED ORDER — ENSURE ENLIVE PO LIQD: 237.0000 mL | Freq: Three times a day (TID) | ORAL | Status: AC

## 2020-10-14 MED ORDER — ONDANSETRON HCL 4 MG/2ML IJ SOLN: 4.0000 mg | Freq: Four times a day (QID) | INTRAMUSCULAR | Status: DC | PRN

## 2020-10-14 MED ORDER — HEPARIN SODIUM (PORCINE) 5000 UNIT/ML IJ SOLN: 5000.0000 [IU] | Freq: Three times a day (TID) | INTRAMUSCULAR | Status: DC

## 2020-10-14 MED ORDER — SACUBITRIL-VALSARTAN 97-103 MG PO TABS
1.0000 | ORAL_TABLET | Freq: Two times a day (BID) | ORAL | Status: DC
  Administered 2020-10-14 – 2020-10-16 (×4): 1 via ORAL
  Filled 2020-10-14 (×4): qty 1

## 2020-10-14 MED ORDER — HYDRALAZINE HCL 20 MG/ML IJ SOLN
10.0000 mg | Freq: Four times a day (QID) | INTRAMUSCULAR | Status: DC | PRN
  Administered 2020-10-14: 10 mg via INTRAVENOUS
  Filled 2020-10-14: qty 1

## 2020-10-14 MED ORDER — DIAZEPAM 5 MG/ML IJ SOLN
2.5000 mg | INTRAMUSCULAR | Status: DC | PRN
  Administered 2020-10-16: 2.5 mg via INTRAVENOUS
  Filled 2020-10-14: qty 2

## 2020-10-14 MED ORDER — MOMETASONE FURO-FORMOTEROL FUM 200-5 MCG/ACT IN AERO
2.0000 | INHALATION_SPRAY | Freq: Two times a day (BID) | RESPIRATORY_TRACT | Status: DC
  Administered 2020-10-15 – 2020-10-16 (×3): 2 via RESPIRATORY_TRACT
  Filled 2020-10-14: qty 8.8

## 2020-10-14 MED ORDER — CARVEDILOL 6.25 MG PO TABS: 6.2500 mg | ORAL_TABLET | Freq: Two times a day (BID) | ORAL | Status: DC

## 2020-10-14 MED ORDER — ROSUVASTATIN CALCIUM 5 MG PO TABS
10.0000 mg | ORAL_TABLET | Freq: Every day | ORAL | Status: DC
  Administered 2020-10-15 – 2020-10-16 (×2): 10 mg via ORAL
  Filled 2020-10-14 (×3): qty 2

## 2020-10-14 MED ORDER — LEVETIRACETAM 500 MG PO TABS: 500.0000 mg | ORAL_TABLET | Freq: Two times a day (BID) | ORAL | Status: DC
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
## 2020-10-14 NOTE — Progress Notes (Signed)
CODE STATUS documentation  Had a long discussion with the patient and his daughter and his fiance.  Given the significant recovery he is made since his admission, the patient and his family would like to reverse his CODE STATUS to FULL CODE.  Order placed in epic.   Lysbeth Galas T. Quentin Ore, MD, [facility], [facility] Cardiac Electrophysiology

## 2020-10-14 NOTE — Discharge Summary (Signed)
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  Fact Sheet for Patients: EntrepreneurPulse.com.au  Fact Sheet for Healthcare Providers: IncredibleEmployment.be  This test is not yet approved or cleared by the [place] FDA and has been authorized for detection and/or diagnosis of SARS-CoV-2 by FDA under an Emergency Use Authorization (EUA). This EUA will remain in effect (meaning this test can be used) for the duration of the COVID-19 declaration under Section 564(b)(1) of the Act, 21 U.S.C. section 360bbb-3(b)(1), unless the authorization is terminated or revoked.  Performed at [facility], [address] [place], [place] [address]   MRSA Next Gen by PCR, Nasal     Status: None   Collection Time: 10/08/20  8:41 PM   Specimen: Nasal Mucosa; Nasal Swab  Result Value Ref Range Status   MRSA by PCR Next Gen NOT DETECTED NOT DETECTED Final    Comment:  (NOTE) The GeneXpert MRSA Assay (FDA approved for NASAL specimens only), is one component of a comprehensive MRSA colonization surveillance program. It is not intended to diagnose MRSA infection nor to guide or monitor treatment for MRSA infections. Test performance is not FDA approved in patients less than 7 years old. Performed at [facility], [address], [place], [place] [address]   Urine Culture     Status: None   Collection Time: 10/09/20  5:10 AM   Specimen: Urine, Catheterized  Result Value Ref Range Status   Specimen Description   Final    URINE, CATHETERIZED Performed at [facility], [address] [place], [place] [address]    Special Requests   Final    NONE Performed at [facility], [address] [place], [place] [address]    Culture   Final    NO GROWTH Performed at [facility], [place]  [address] [place], [place] [address]    Report Status 10/10/2020 FINAL  Final  Culture, Respiratory w Gram Stain     Status: None   Collection Time: 10/09/20  5:22 AM   Specimen: Tracheal Aspirate; Respiratory  Result Value Ref Range Status   Specimen Description   Final    TRACHEAL ASPIRATE Performed at [facility], [address] [place], [place] [address]    Special Requests   Final    NONE Performed at [facility], [address] [place], [place] [address]    Gram Stain   Final    WBC PRESENT, PREDOMINANTLY PMN FEW SQUAMOUS EPITHELIAL CELLS PRESENT FEW GRAM POSITIVE COCCI RARE GRAM NEGATIVE RODS    Culture   Final    RARE Consistent with normal respiratory flora. No Pseudomonas species isolated Performed at [place] [address], [place], [place] [address]    Report Status 10/11/2020 FINAL  Final  CULTURE, BLOOD (ROUTINE X 2) w Reflex to ID Panel     Status: None   Collection Time: 10/09/20  8:50 AM   Specimen: BLOOD RIGHT HAND  Result Value Ref Range Status   Specimen Description  BLOOD RIGHT HAND  Final   Special Requests   Final    BOTTLES DRAWN AEROBIC ONLY Blood Culture results may not be optimal due to an inadequate volume of blood received in culture bottles   Culture   Final    NO GROWTH 5 DAYS Performed at [facility], [address] [place], [place] [address]    Report Status 10/14/2020 FINAL  Final  CULTURE, BLOOD (ROUTINE X 2) w Reflex to ID Panel     Status: None   Collection Time: 10/09/20  8:50 AM   Specimen: BLOOD LEFT HAND  Result Value Ref Range Status   Specimen Description BLOOD LEFT HAND  Final   Special Requests   Final    BOTTLES DRAWN AEROBIC ONLY Blood Culture adequate volume   Culture   Final    NO GROWTH 5 DAYS Performed at [facility], [address], [place], [place] [address]    Report Status 10/14/2020 FINAL  Final  Culture, Respiratory w Gram Stain     Status: None   Collection Time: 10/12/20  9:55 AM   Specimen: Tracheal Aspirate; Respiratory  Result Value Ref Range Status   Specimen Description   Final    TRACHEAL ASPIRATE Performed at [facility], [address] [place], [place] [address]    Special Requests   Final    NONE Performed at [facility], [place], [place] [address]    Gram Stain   Final    FEW WBC PRESENT,BOTH PMN AND MONONUCLEAR RARE GRAM POSITIVE COCCI RARE GRAM NEGATIVE RODS    Culture   Final    FEW Consistent with normal respiratory flora. No Pseudomonas species isolated Performed at [place] [address], [place], [place] [address]    Report Status 10/14/2020 FINAL  Final     Total time spend on discharging this patient, including the last patient exam, discussing the hospital stay, instructions for ongoing care as it relates to all pertinent caregivers, as well as preparing the medical discharge records, prescriptions, and/or referrals as applicable, is 60 minutes.    Enzo Bi, MD  [facility] 10/14/2020, 6:24  PM

## 2020-10-15 ENCOUNTER — Encounter (HOSPITAL_COMMUNITY): Payer: Self-pay | Admitting: Internal Medicine

## 2020-10-15 ENCOUNTER — Inpatient Hospital Stay (HOSPITAL_COMMUNITY): Payer: Medicare Other | Admitting: Certified Registered Nurse Anesthetist

## 2020-10-15 ENCOUNTER — Ambulatory Visit (HOSPITAL_COMMUNITY): Admission: RE | Admit: 2020-10-15 | Payer: Medicare Other | Source: Home / Self Care | Admitting: Cardiology

## 2020-10-15 ENCOUNTER — Other Ambulatory Visit: Payer: Self-pay

## 2020-10-15 ENCOUNTER — Inpatient Hospital Stay (HOSPITAL_COMMUNITY)
Admission: AD | Disposition: A | Discharge status: Home / Self Care | Payer: Self-pay | Source: Other Acute Inpatient Hospital | Attending: Internal Medicine

## 2020-10-15 DIAGNOSIS — I5022 Chronic systolic (congestive) heart failure: Secondary | ICD-10-CM

## 2020-10-15 DIAGNOSIS — I469 Cardiac arrest, cause unspecified: Secondary | ICD-10-CM

## 2020-10-15 HISTORY — PX: SUBQ ICD IMPLANT: EP1223

## 2020-10-15 LAB — BASIC METABOLIC PANEL
Anion gap: 6 (ref 5–15)
BUN: 51 mg/dL — ABNORMAL HIGH (ref 8–23)
CO2: 26 mmol/L (ref 22–32)
Calcium: 8.9 mg/dL (ref 8.9–10.3)
Chloride: 108 mmol/L (ref 98–111)
Creatinine, Ser: 1.82 mg/dL — ABNORMAL HIGH (ref 0.61–1.24)
GFR, Estimated: 40 mL/min — ABNORMAL LOW (ref >60)
Glucose, Bld: 110 mg/dL — ABNORMAL HIGH (ref 70–99)
Potassium: 3.6 mmol/L (ref 3.5–5.1)
Sodium: 140 mmol/L (ref 135–145)

## 2020-10-15 LAB — CBC
HCT: 37 % — ABNORMAL LOW (ref 39.0–52.0)
Hemoglobin: 12.7 g/dL — ABNORMAL LOW (ref 13.0–17.0)
MCH: 30.1 pg (ref 26.0–34.0)
MCHC: 34.3 g/dL (ref 30.0–36.0)
MCV: 87.7 fL (ref 80.0–100.0)
Platelets: 130 10*3/uL — ABNORMAL LOW (ref 150–400)
RBC: 4.22 MIL/uL (ref 4.22–5.81)
RDW: 14.1 % (ref 11.5–15.5)
WBC: 8.4 10*3/uL (ref 4.0–10.5)
nRBC: 0 % (ref 0.0–0.2)

## 2020-10-15 LAB — HIV ANTIBODY (ROUTINE TESTING W REFLEX): HIV Screen 4th Generation wRfx: NONREACTIVE

## 2020-10-15 LAB — SURGICAL PCR SCREEN
MRSA, PCR: NEGATIVE
Staphylococcus aureus: POSITIVE — AB

## 2020-10-15 SURGERY — SUBQ ICD IMPLANT
Anesthesia: General

## 2020-10-15 MED ORDER — CHLORHEXIDINE GLUCONATE 4 % EX LIQD: 60.0000 mL | Freq: Once | CUTANEOUS | Status: DC

## 2020-10-15 MED ORDER — ROCURONIUM BROMIDE 10 MG/ML (PF) SYRINGE
PREFILLED_SYRINGE | INTRAVENOUS | Status: DC | PRN
  Administered 2020-10-15: 60 mg via INTRAVENOUS

## 2020-10-15 MED ORDER — SODIUM CHLORIDE 0.9 % IV SOLN
INTRAVENOUS | Status: AC
  Filled 2020-10-15: qty 2

## 2020-10-15 MED ORDER — PHENYLEPHRINE HCL-NACL 20-0.9 MG/250ML-% IV SOLN
INTRAVENOUS | Status: DC | PRN
  Administered 2020-10-15: 35 ug/min via INTRAVENOUS

## 2020-10-15 MED ORDER — PROSOURCE PLUS PO LIQD
30.0000 mL | Freq: Two times a day (BID) | ORAL | Status: DC
  Administered 2020-10-16: 30 mL via ORAL
  Filled 2020-10-15: qty 30

## 2020-10-15 MED ORDER — ONDANSETRON HCL 4 MG/2ML IJ SOLN
INTRAMUSCULAR | Status: DC | PRN
  Administered 2020-10-15: 4 mg via INTRAVENOUS

## 2020-10-15 MED ORDER — LIDOCAINE 2% (20 MG/ML) 5 ML SYRINGE
INTRAMUSCULAR | Status: DC | PRN
  Administered 2020-10-15: 60 mg via INTRAVENOUS

## 2020-10-15 MED ORDER — CHLORHEXIDINE GLUCONATE 4 % EX LIQD
60.0000 mL | Freq: Once | CUTANEOUS | Status: AC
  Administered 2020-10-15: 4 via TOPICAL
  Filled 2020-10-15: qty 60

## 2020-10-15 MED ORDER — SUGAMMADEX SODIUM 200 MG/2ML IV SOLN
INTRAVENOUS | Status: DC | PRN
  Administered 2020-10-15: 200 mg via INTRAVENOUS

## 2020-10-15 MED ORDER — MUPIROCIN 2 % EX OINT
1.0000 "application " | TOPICAL_OINTMENT | Freq: Two times a day (BID) | CUTANEOUS | Status: DC
  Administered 2020-10-15 – 2020-10-16 (×3): 1 via NASAL
  Filled 2020-10-15: qty 22

## 2020-10-15 MED ORDER — VANCOMYCIN HCL IN DEXTROSE 1-5 GM/200ML-% IV SOLN
INTRAVENOUS | Status: AC
  Filled 2020-10-15: qty 200

## 2020-10-15 MED ORDER — SODIUM CHLORIDE 0.9 % IV SOLN
INTRAVENOUS | Status: DC
Start: 2020-10-15 — End: 2020-10-15

## 2020-10-15 MED ORDER — PROPOFOL 10 MG/ML IV BOLUS
INTRAVENOUS | Status: DC | PRN
  Administered 2020-10-15 (×2): 50 mg via INTRAVENOUS

## 2020-10-15 MED ORDER — HEPARIN (PORCINE) IN NACL 1000-0.9 UT/500ML-% IV SOLN
INTRAVENOUS | Status: DC | PRN
  Administered 2020-10-15: 500 mL

## 2020-10-15 MED ORDER — SODIUM CHLORIDE 0.9 % IV SOLN
80.0000 mg | INTRAVENOUS | Status: AC
  Administered 2020-10-15: 80 mg
  Filled 2020-10-15: qty 2

## 2020-10-15 MED ORDER — VANCOMYCIN HCL IN DEXTROSE 1-5 GM/200ML-% IV SOLN
1000.0000 mg | INTRAVENOUS | Status: AC
Start: 2020-10-15 — End: 2020-10-15
  Administered 2020-10-15: 1000 mg via INTRAVENOUS
  Filled 2020-10-15: qty 200

## 2020-10-15 MED ORDER — FENTANYL CITRATE (PF) 250 MCG/5ML IJ SOLN
INTRAMUSCULAR | Status: DC | PRN
  Administered 2020-10-15: 100 ug via INTRAVENOUS

## 2020-10-15 MED ORDER — MIDAZOLAM HCL 5 MG/5ML IJ SOLN
INTRAMUSCULAR | Status: DC | PRN
  Administered 2020-10-15: 2 mg via INTRAVENOUS

## 2020-10-15 MED ORDER — HYDRALAZINE HCL 20 MG/ML IJ SOLN
10.0000 mg | Freq: Once | INTRAMUSCULAR | Status: AC
  Administered 2020-10-15: 10 mg via INTRAVENOUS
  Filled 2020-10-15: qty 1

## 2020-10-15 MED ORDER — SODIUM CHLORIDE 0.9 % IV SOLN: INTRAVENOUS | Status: DC | PRN

## 2020-10-15 MED ORDER — ETOMIDATE 2 MG/ML IV SOLN
INTRAVENOUS | Status: DC | PRN
  Administered 2020-10-15: 12 mg via INTRAVENOUS

## 2020-10-15 MED ORDER — CHLORHEXIDINE GLUCONATE CLOTH 2 % EX PADS
6.0000 | MEDICATED_PAD | Freq: Every day | CUTANEOUS | Status: DC
  Administered 2020-10-16: 6 via TOPICAL

## 2020-10-15 MED ORDER — LIDOCAINE HCL (PF) 1 % IJ SOLN
INTRAMUSCULAR | Status: DC | PRN
  Administered 2020-10-15: 60 mL

## 2020-10-15 MED ORDER — VANCOMYCIN HCL IN DEXTROSE 1-5 GM/200ML-% IV SOLN
1000.0000 mg | Freq: Two times a day (BID) | INTRAVENOUS | Status: AC
  Administered 2020-10-16: 1000 mg via INTRAVENOUS
  Filled 2020-10-15 (×2): qty 200

## 2020-10-15 MED ORDER — ONDANSETRON HCL 4 MG/2ML IJ SOLN: 4.0000 mg | Freq: Four times a day (QID) | INTRAMUSCULAR | Status: DC | PRN

## 2020-10-15 MED ORDER — ACETAMINOPHEN 325 MG PO TABS
325.0000 mg | ORAL_TABLET | ORAL | Status: DC | PRN
  Administered 2020-10-16: 650 mg via ORAL
  Filled 2020-10-15: qty 2

## 2020-10-15 SURGICAL SUPPLY — 6 items
CABLE SURGICAL S-101-97-12 (CABLE) ×2 IMPLANT
ICD SUBCU MRI EMBLEM A219 (ICD Generator) ×2 IMPLANT
LEAD SUBQU EMBLEM 3501 (Pacemaker) ×2 IMPLANT
MAT PREVALON FULL STRYKER (MISCELLANEOUS) ×2 IMPLANT
SHEATH 9FR PRELUDE SNAP 13 (SHEATH) ×2 IMPLANT
TRAY PACEMAKER INSERTION (PACKS) ×2 IMPLANT

## 2020-10-15 NOTE — Anesthesia Procedure Notes (Signed)
Procedure Name: Intubation Date/Time: 10/15/2020 2:25 PM Performed by: Glynda Jaeger, CRNA Pre-anesthesia Checklist: Patient identified, Patient being monitored, Timeout performed, Emergency Drugs available and Suction available Patient Re-evaluated:Patient Re-evaluated prior to induction Oxygen Delivery Method: Circle System Utilized Preoxygenation: Pre-oxygenation with 100% oxygen Induction Type: IV induction Ventilation: Mask ventilation without difficulty Laryngoscope Size: Mac and 3 Grade View: Grade I Tube type: Oral Tube size: 7.5 mm Number of attempts: 1 Airway Equipment and Method: Stylet Placement Confirmation: ETT inserted through vocal cords under direct vision, positive ETCO2 and breath sounds checked- equal and bilateral Secured at: 21 cm Tube secured with: Tape Dental Injury: Teeth and Oropharynx as per pre-operative assessment
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
## 2020-10-15 NOTE — Transfer of Care (Signed)
Immediate Anesthesia Transfer of Care Note  Patient: Jonathon Rios  Procedure(s) Performed: SUBQ ICD IMPLANT  Patient Location: PACU and Cath Lab  Anesthesia Type:General  Level of Consciousness: drowsy and patient cooperative  Airway & Oxygen Therapy: Patient Spontanous Breathing and Patient connected to face mask oxygen  Post-op Assessment: Report given to RN and Post -op Vital signs reviewed and stable  Post vital signs: Reviewed and stable  Last Vitals:  Vitals Value Taken Time  BP 189/106 10/15/20 1606  Temp 37.2 C 10/15/20 1603  Pulse 85 10/15/20 1611  Resp 20 10/15/20 1611  SpO2 97 % 10/15/20 1611  Vitals shown include unvalidated device data.  Last Pain:  Vitals:   10/15/20 1603  TempSrc: Temporal  PainSc: 0-No pain      Patients Stated Pain Goal: 0 ([phone]  Complications: No notable events documented.

## 2020-10-15 NOTE — Anesthesia Preprocedure Evaluation (Addendum)
Anesthesia Evaluation  Patient identified by MRN, date of birth, ID band Patient awake    Reviewed: Allergy & Precautions, NPO status , Patient's Chart, lab work & pertinent test results  Airway Mallampati: III  TM Distance: >3 FB Neck ROM: Full    Dental  (+) Poor Dentition   Pulmonary COPD, Current Smoker,    Pulmonary exam normal breath sounds clear to auscultation       Cardiovascular hypertension, + CAD and +CHF (Had an outside hospital cardiac arrest)  Normal cardiovascular exam Rhythm:Regular Rate:Normal  EKG: Ectopic atrial rhythm LVH with secondary repolarization abnormality Borderline prolonged QT interval Baseline wander in lead(s) V3 Confirmed by UNCONFIRMED, DOCTOR ([id number]), editor Mel Almond, Tammy [phone]) on 10/09/2020 8:10:52 AM  ECHO 10/09/20  1. Left ventricular ejection fraction, by estimation, is 25 to 30%. The  left ventricle has severely decreased function. The left ventricle  demonstrates global hypokinesis. Left ventricular diastolic parameters are  consistent with Grade I diastolic  dysfunction (impaired relaxation).  2. Right ventricular systolic function is moderately reduced. The right  ventricular size is normal.  3. The mitral valve is normal in structure. No evidence of mitral valve  regurgitation.  4. The aortic valve is tricuspid. Aortic valve regurgitation is not  visualized.  5. Aortic dilatation noted. There is mild dilatation of the aortic root,  measuring 39 mm.  6. The inferior vena cava is normal in size with greater than 50%  respiratory variability, suggesting right atrial pressure of 3 mmHg.    Neuro/Psych Seizures -,  PSYCHIATRIC DISORDERS Anxiety Depression    GI/Hepatic (+)     substance abuse (h/o cocaine use)  , Hepatitis -, C  Endo/Other  negative endocrine ROS  Renal/GU Renal InsufficiencyRenal disease     Musculoskeletal  (+) Arthritis , Osteoarthritis,     Abdominal   Peds negative pediatric ROS (+)  Hematology  (+) anemia ,   Anesthesia Other Findings   Reproductive/Obstetrics                           Anesthesia Physical Anesthesia Plan  ASA: 4  Anesthesia Plan: General   Post-op Pain Management:    Induction: Intravenous  PONV Risk Score and Plan: 1 and Treatment may vary due to age or medical condition and Ondansetron  Airway Management Planned: Oral ETT  Additional Equipment: None  Intra-op Plan:   Post-operative Plan: Extubation in OR  Informed Consent: I have reviewed the patients History and Physical, chart, labs and discussed the procedure including the risks, benefits and alternatives for the proposed anesthesia with the patient or authorized representative who has indicated his/her understanding and acceptance.     Dental advisory given  Plan Discussed with: CRNA and Anesthesiologist  Anesthesia Plan Comments:         Anesthesia Quick Evaluation

## 2020-10-15 NOTE — Progress Notes (Addendum)
Progress Note  Patient Name: Jonathon Rios Date of Encounter: 10/15/2020  [facility] Cardiologist: [name], MD   Subjective   Only c/o being hungry and hopes to go home tomorrow  Inpatient Medications    Scheduled Meds:  aspirin EC  81 mg Oral Daily   carvedilol  6.25 mg Oral BID WC   chlorhexidine  60 mL Topical Once   Chlorhexidine Gluconate Cloth  6 each Topical Daily   feeding supplement  237 mL Oral TID BM   gentamicin irrigation  80 mg Irrigation To Cath   levETIRAcetam  500 mg Oral BID   mometasone-formoterol  2 puff Inhalation BID   multivitamin with minerals  1 tablet Oral Daily   rosuvastatin  10 mg Oral Daily   sacubitril-valsartan  1 tablet Oral BID   Continuous Infusions:  sodium chloride     vancomycin     PRN Meds: acetaminophen, albuterol, diazepam, glycopyrrolate, hydrALAZINE, ondansetron (ZOFRAN) IV, traZODone   Vital Signs    Vitals:   10/14/20 2006 10/14/20 2334 10/15/20 0403  BP: (!) 179/107 (!) 168/97 (!) 152/84  Pulse: 72  73  Resp: 18  18  Temp: 98.2 F (36.8 C) 98.3 F (36.8 C) 98.3 F (36.8 C)  TempSrc: Oral Oral Oral  SpO2: 95% 95%   Weight: 83.2 kg  82.8 kg  Height: '6\' 3"'$  (1.905 m)     No intake or output data in the 24 hours ending 10/15/20 0847 Last 3 Weights 10/15/2020 10/14/2020 10/14/2020  Weight (lbs) 182 lb 9.6 oz 183 lb 8 oz 189 lb 13.1 oz  Weight (kg) 82.827 kg 83.235 kg 86.1 kg      Telemetry    SR, infrequent PVCs - Personally Reviewed  ECG    No new EKGs - Personally Reviewed  Physical Exam   GEN: No acute distress.   Neck: No JVD Cardiac: RRR, no murmurs, rubs, or gallops.  Respiratory: CTA b/l GI: Soft, nontender, non-distended  MS: No edema; No deformity. Neuro:  Nonfocal  Psych: Normal affect   Labs    High Sensitivity Troponin:   Recent Labs  Lab 10/08/20 1502 10/08/20 1801 10/08/20 2356 10/09/20 0420 10/09/20 0850  TROPONINIHS 18* 199* 312* 201* 166*      Chemistry Recent  Labs  Lab 10/09/20 0850 10/10/20 0350 10/11/20 0532 10/12/20 0441 10/13/20 0543 10/14/20 0507 10/15/20 0500  NA 140 140 134*   < > 137 139 140  K 4.7 4.7 3.8   < > 4.0 4.2 3.6  CL 106 108 104   < > 105 105 108  CO2 21* 21* 25   < > '24 24 26  '$ GLUCOSE 75 74 401*   < > 156* 115* 110*  BUN 26* 27* 33*   < > 32* 45* 51*  CREATININE 2.18* 1.95* 1.86*   < > 1.77* 1.71* 1.82*  CALCIUM 9.0 8.2* 7.8*   < > 8.1* 8.8* 8.9  PROT 8.3* 7.6 6.8  --   --   --   --   ALBUMIN 4.2 3.7 3.0*  --   --   --   --   AST 40 39 22  --   --   --   --   ALT 34 27 19  --   --   --   --   ALKPHOS 57 47 42  --   --   --   --   BILITOT 1.1 1.1 0.8  --   --   --   --  GFRNONAA 32* 37* 39*   < > 42* 43* 40*  ANIONGAP '13 11 5   '$ < > '8 10 6   '$ < > = values in this interval not displayed.     Hematology Recent Labs  Lab 10/13/20 0543 10/14/20 0507 10/15/20 0500  WBC 7.6 9.1 8.4  RBC 4.02* 4.15* 4.22  HGB 12.4* 12.8* 12.7*  HCT 36.0* 36.6* 37.0*  MCV 89.6 88.2 87.7  MCH 30.8 30.8 30.1  MCHC 34.4 35.0 34.3  RDW 14.2 14.1 14.1  PLT 114* 120* 130*    BNP Recent Labs  Lab 10/08/20 1502  BNP 1,017.7*     DDimer No results for input(s): DDIMER in the last 168 hours.   Radiology    No results found.  Cardiac Studies    10/09/20: TTE IMPRESSIONS   1. Left ventricular ejection fraction, by estimation, is 25 to 30%. The  left ventricle has severely decreased function. The left ventricle  demonstrates global hypokinesis. Left ventricular diastolic parameters are  consistent with Grade I diastolic  dysfunction (impaired relaxation).   2. Right ventricular systolic function is moderately reduced. The right  ventricular size is normal.   3. The mitral valve is normal in structure. No evidence of mitral valve  regurgitation.   4. The aortic valve is tricuspid. Aortic valve regurgitation is not  visualized.   5. Aortic dilatation noted. There is mild dilatation of the aortic root,  measuring 39 mm.    6. The inferior vena cava is normal in size with greater than 50%  respiratory variability, suggesting right atrial pressure of 3 mmHg.     07/30/20: R/LHC 1.  Minimal luminal irregularities with no evidence of obstructive coronary artery disease. 2.  Left ventricular angiography was not performed.  EF was 25 to 30% by echo. 3.  Right heart cath patient showed mildly elevated filling pressures, mild to moderate pulmonary hypertension and normal cardiac output  Patient Profile     70 y.o. 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 Quentin Ore, the patient had no follow up questions, remains agreeable to proceed  2. NICM Does not appear volume OL  3. HTN Just got his AM meds  For questions or updates, please contact [facility] Please consult [email or web address] for contact info under        Signed, [place], PA-C  10/15/2020, 8:47 AM
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
## 2020-10-16 ENCOUNTER — Inpatient Hospital Stay (HOSPITAL_COMMUNITY): Payer: Medicare Other

## 2020-10-16 ENCOUNTER — Encounter (HOSPITAL_COMMUNITY): Payer: Self-pay | Admitting: Cardiology

## 2020-10-16 ENCOUNTER — Telehealth: Payer: Self-pay

## 2020-10-16 DIAGNOSIS — I5043 Acute on chronic combined systolic (congestive) and diastolic (congestive) heart failure: Secondary | ICD-10-CM

## 2020-10-16 LAB — BASIC METABOLIC PANEL
Anion gap: 6 (ref 5–15)
BUN: 39 mg/dL — ABNORMAL HIGH (ref 8–23)
CO2: 26 mmol/L (ref 22–32)
Calcium: 8.8 mg/dL — ABNORMAL LOW (ref 8.9–10.3)
Chloride: 107 mmol/L (ref 98–111)
Creatinine, Ser: 1.61 mg/dL — ABNORMAL HIGH (ref 0.61–1.24)
GFR, Estimated: 47 mL/min — ABNORMAL LOW (ref >60)
Glucose, Bld: 96 mg/dL (ref 70–99)
Potassium: 3.7 mmol/L (ref 3.5–5.1)
Sodium: 139 mmol/L (ref 135–145)

## 2020-10-16 LAB — CBC
HCT: 40 % (ref 39.0–52.0)
Hemoglobin: 13.8 g/dL (ref 13.0–17.0)
MCH: 30.3 pg (ref 26.0–34.0)
MCHC: 34.5 g/dL (ref 30.0–36.0)
MCV: 87.7 fL (ref 80.0–100.0)
Platelets: 145 10*3/uL — ABNORMAL LOW (ref 150–400)
RBC: 4.56 MIL/uL (ref 4.22–5.81)
RDW: 14 % (ref 11.5–15.5)
WBC: 7.9 10*3/uL (ref 4.0–10.5)
nRBC: 0 % (ref 0.0–0.2)

## 2020-10-16 MED ORDER — ACETAMINOPHEN 325 MG PO TABS: 650.0000 mg | ORAL_TABLET | Freq: Four times a day (QID) | ORAL | Status: AC | PRN

## 2020-10-16 MED FILL — Lidocaine HCl Local Inj 1%: INTRAMUSCULAR | Qty: 60 | Status: AC

## 2020-10-16 NOTE — Discharge Summary (Signed)
Physician Discharge Summary  Jonathon Rios [id number] DOB: 1950-09-01 DOA: 10/14/2020  PCP: [name], FNP  Admit date: 10/14/2020 Discharge date: 10/16/2020  Admitted From: Home Disposition:  Home   Recommendations for Outpatient Follow-up:  Follow up with PCP in 1-2 weeks Please obtain BMP/CBC in one week   Home Health:NO  Discharge Condition:Stable CODE STATUS:FULL Diet recommendation: Heart Healthy  Brief/Interim Summary:   Jonathon Rios is a 70 y.o. male with medical history significant of HTN, seizure, prior cocaine abuse quit several years ago, NICM with EF 25-30%.  Non-occlusive CAD on LHC in June.  Admitted to [facility] due to cardiac arrest, found to be in V. Fib, 2 defibrillation's and EPI x1 with ROSC.  Estimated CPR and ACLS time was ~10 mins before ROSC obtained. - ED and [facility] Course: Pt intubated, put on cooling protocol. Initially encephalopathic. - Pt weaned off of vasopressors, extubated, and has made a remarkable recovery.  Creat now down to 1.78 which is about baseline.  Mental status also recovered,   He has been transferred from [facility] to [facility] at the recommendation of Dr. Quentin Ore (Cardiology / ECP) for placement of AICD.      S/p V.Fib cardiac arrest - - Remarkable recovery post hypothermia at [facility] (see DC summary for details) -Pt now transferred to [facility] for AICD placement. -EP cardiology input greatly appreciated,S/p S-ICD implant yesterday with Dr. Quentin Ore Device check this morning with intact function Site is stable Wound care and activity restrictions were discussed with the patient (and his wife on speaker phone) EP follow up is in place  Pt advised, no driving 37mo made aware [place] law     HFrEF  -EF 25-30% -Continue Entresto, Coreg, s/p AICD Seizure history  -Cont BID Keppra   CKD stage IIIA -Creat of 1.6 on discharge    hypertension - Entresto and Coreg    Discharge Diagnoses:  Principal Problem:   Cardiac arrest  ([facility] Active Problems:   HTN (hypertension)   Tobacco use   History of cocaine abuse ([facility] [place]   CKD (chronic kidney disease)   Seizures ([facility]   Chronic HFrEF (heart failure with reduced ejection fraction) (HCC)   NICM (nonischemic cardiomyopathy) ([facility]   COPD (chronic obstructive pulmonary disease) ([facility]    Discharge Instructions  Discharge Instructions     [place]B Referral to [place]  Complete by: As directed    Embedded provider:  LCarman Ching ABurnard Hawthorne FNP   Please assign to Embedded RN Care Coordinator for complex care and disease management follow up calls and assess for further needs.  Questions please call:   VNatividad Brood RN BSN [facility] HospitalLiaison  [phone] mobile phone Toll free office [phone] Fax number: [phone]   Reason for Referral: Embedded Chronic Care Management Services (Dept. specific)   Disease management services needed: Nurse Case Manager   Diagnoses of: Other   Other Diagnosis: High risk for unplanned readmission   Expected date of contact: Routine - 30 Days   Diet - low sodium heart healthy   Complete by: As directed    Increase activity slowly   Complete by: As directed    No wound care   Complete by: As directed       Allergies as of 10/16/2020       Reactions   Penicillins Hives   Has patient had a PCN reaction causing immediate rash, facial/tongue/throat swelling, SOB or lightheadedness with hypotension:  Yes Has patient had a PCN reaction causing severe rash involving mucus membranes or skin necrosis: No Has patient had a PCN reaction that required hospitalization: No Has patient had a PCN reaction occurring within the last 10 years: No If all of the above answers are "NO", then may proceed with Cephalosporin use.        Medication List     TAKE these medications    acetaminophen 325 MG  tablet Commonly known as: TYLENOL Take 2 tablets (650 mg total) by mouth every 6 (six) hours as needed for fever.   albuterol 108 (90 Base) MCG/ACT inhaler Commonly known as: ProAir HFA Inhale 2 puffs into the lungs every 6 (six) hours as needed for wheezing or shortness of breath.   aspirin EC 81 MG tablet Take 1 tablet (81 mg total) by mouth daily.   budesonide-formoterol 160-4.5 MCG/ACT inhaler Commonly known as: Symbicort TAKE 2 PUFFS BY MOUTH TWICE A DAY   carvedilol 6.25 MG tablet Commonly known as: COREG Take 1 tablet (6.25 mg total) by mouth 2 (two) times daily with a meal.   Entresto 97-103 MG Generic drug: sacubitril-valsartan TAKE 1 TABLET BY MOUTH TWICE A DAY   feeding supplement Liqd Take 237 mLs by mouth 3 (three) times daily between meals.   furosemide 20 MG tablet Commonly known as: LASIX Take 1 tablet (20 mg total) by mouth daily as needed (As needed for swelling).   levETIRAcetam 500 MG tablet Commonly known as: KEPPRA Take 1 tablet (500 mg total) by mouth 2 (two) times daily.   multivitamin with minerals Tabs tablet Take 1 tablet by mouth daily.   rosuvastatin 10 MG tablet Commonly known as: Crestor Take 1 tablet (10 mg total) by mouth daily.        Follow-up Information     [name], PA-C Follow up.   Specialties: Physician Assistant, Cardiology, Radiology Why: 10/29/20 @ 2:30PM Contact information: [place] [address] [phone]         [facility] Follow up.   Specialty: Cardiology Why: 10/28/20 @ 3:20PM, wound check visit Contact information: [address], [address] [place]        Vickie Epley, MD Follow up.   Specialties: Cardiology, Radiology Why: 01/22/21 @ 11:40AM Contact information: [place] [address] [phone]                Allergies  Allergen Reactions    Penicillins Hives    Has patient had a PCN reaction causing immediate rash, facial/tongue/throat swelling, SOB or lightheadedness with hypotension: Yes Has patient had a PCN reaction causing severe rash involving mucus membranes or skin necrosis: No Has patient had a PCN reaction that required hospitalization: No Has patient had a PCN reaction occurring within the last 10 years: No If all of the above answers are "NO", then may proceed with Cephalosporin use.    Consultations: EP cardiology   Procedures/Studies: DG Chest 2 View  Result Date: 10/16/2020 CLINICAL DATA:  Interval placement of subcutaneous ICD EXAM: CHEST - 2 VIEW COMPARISON:  10/11/2020. FINDINGS: Interval placement of subcutaneous ICD device. The pulse generator is identified within the soft tissues of the lateral chest wall. Intact lead is identified extending along the midline of the ventral chest. Right arm PICC line tip is in the projection of the distal SVC. Mild cardiac enlargement. No pleural effusion or edema. No airspace  opacities. IMPRESSION: Interval placement of subcutaneous ICD device with lead coursing along the midline of the ventral chest. No complications identified. Electronically Signed   By: Kerby Moors M.D.   On: 10/16/2020 09:15   DG Abd 1 View  Result Date: 10/11/2020 CLINICAL DATA:  Check gastric catheter placement EXAM: ABDOMEN - 1 VIEW COMPARISON:  None. FINDINGS: Gastric catheter is noted in the distal stomach. Scattered large and small bowel gas is seen. No obstructive changes are noted. Degenerative change of the lumbar spine is seen. IMPRESSION: Gastric catheter deep within the stomach. Electronically Signed   By: Inez Catalina M.D.   On: 10/11/2020 00:32   DG Abd 1 View  Result Date: 10/08/2020 CLINICAL DATA:  OG tube placement EXAM: ABDOMEN - 1 VIEW COMPARISON:  None. FINDINGS: Gastric tube tip overlies the stomach with side port at the diaphragm, likely just above the GE junction. Limited  imaging of the abdomen shows a nonobstructive bowel gas pattern. Imaged lung bases are clear. IMPRESSION: 1. Gastric tube side port likely at the diaphragm, just above the GE junction. Recommend advancement. 2. Nonobstructive bowel gas pattern in this limited portion of the abdomen. Electronically Signed   By: Merilyn Baba MD   On: 10/08/2020 16:28   CT Head Wo Contrast  Result Date: 10/08/2020 CLINICAL DATA:  Altered mental status. EXAM: CT HEAD WITHOUT CONTRAST TECHNIQUE: Contiguous axial images were obtained from the base of the skull through the vertex without intravenous contrast. COMPARISON:  None. FINDINGS: Brain: Mild chronic ischemic white matter disease is noted. No mass effect or midline shift is noted. Ventricular size is within normal limits. There is no evidence of hemorrhage or acute infarction. Possible soft tissue abnormality is seen in the region of the right cavernous sinus. Vascular: No hyperdense vessel or unexpected calcification. Skull: Normal. Negative for fracture or focal lesion. Sinuses/Orbits: No acute finding. Other: None. IMPRESSION: Possible soft tissue abnormality is seen in the region of the right cavernous sinus; further evaluation with MRI with and without gadolinium is recommended. Electronically Signed   By: Marijo Conception M.D.   On: 10/08/2020 15:58   MR BRAIN W WO CONTRAST  Result Date: 10/09/2020 CLINICAL DATA:  Seizure.  Abnormal neurological exam. EXAM: MRI HEAD WITHOUT AND WITH CONTRAST TECHNIQUE: Multiplanar, multiecho pulse sequences of the brain and surrounding structures were obtained without and with intravenous contrast. CONTRAST:  7.12m GADAVIST GADOBUTROL 1 MMOL/ML IV SOLN COMPARISON:  Head CT yesterday. FINDINGS: Brain: Diffusion imaging does not show any acute or subacute infarction. Chronic small-vessel ischemic changes affect the pons. No focal cerebellar finding. Cerebral hemispheres show moderate to severe chronic small-vessel ischemic changes the  white matter. No cortical or large vessel territory infarction. No mass lesion, hemorrhage, hydrocephalus or extra-axial collection. No cavernous sinus lesion as question by CT. Mesial temporal lobes appear symmetric and normal. After contrast administration, no abnormal enhancement occurs. Vascular: Major vessels at the base of the brain show flow. Skull and upper cervical spine: Negative Sinuses/Orbits: Clear except for a few retention cysts of the maxillary sinuses and few opacified air cells in the left posterior ethmoid region. Fluid in the nasopharynx, often seen during endotracheal intubation. Other: None IMPRESSION: No acute intracranial finding. 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 No abnormality the cavernous sinus as was questioned by CT. Electronically Signed   By: MNelson ChimesM.D.   On: 10/09/2020 12:43   DG Chest Port 1 View  Result Date: 10/11/2020  CLINICAL DATA:  Acute respiratory failure EXAM: PORTABLE CHEST 1 VIEW COMPARISON:  10/09/2020 FINDINGS: Cardiac shadow is enlarged but stable. Endotracheal tube, gastric catheter and right-sided PICC line are seen in satisfactory position. Lungs are well aerated bilaterally. No focal confluent infiltrate is seen. Minimal right basilar atelectasis is noted. IMPRESSION: Tubes and lines as described. Minimal right basilar atelectasis. Electronically Signed   By: Inez Catalina M.D.   On: 10/11/2020 00:32   DG Chest Port 1 View  Result Date: 10/09/2020 CLINICAL DATA:  Acute respiratory failure EXAM: PORTABLE CHEST 1 VIEW COMPARISON:  10/08/2020 FINDINGS: Endotracheal tube terminates 5.5 cm above the carina. Lungs are clear.  No pleural effusion or pneumothorax. Mild cardiomegaly. Enteric tube courses into the stomach. Defibrillator pads overlying the left hemithorax. IMPRESSION: Support apparatus as above. No evidence of acute cardiopulmonary disease. Electronically Signed   By: Julian Hy M.D.   On: 10/09/2020 02:29   DG Chest Port 1 View  Result Date: 10/08/2020 CLINICAL DATA:  Intubated. EXAM: PORTABLE CHEST 1 VIEW COMPARISON:  Chest CT 07/15/2020 FINDINGS: The endotracheal tube is 5 cm above the carina. External pacer paddles are noted. The heart is within normal limits in size given the AP projection and portable technique. Moderate tortuosity of the thoracic aorta. The lungs are grossly clear. No pleural effusions or pneumothorax. IMPRESSION: The endotracheal tube is 5 cm above the carina. No acute cardiopulmonary findings. Electronically Signed   By: Marijo Sanes M.D.   On: 10/08/2020 15:49   EEG adult  Result Date: 10/09/2020 Lora Havens, MD     10/09/2020  9:58 PM Patient Name: Jonathon Rios MRN: [id number] Epilepsy Attending: Lora Havens Referring Physician/Provider: Darel Hong, NP Date: 10/09/2020 Duration: 31 mins Patient history: 70 year old man status post cardiac arrest.  EEG to evaluate for seizures. Level of alertness: comatose AEDs during EEG study: LEV, versed Technical aspects: This EEG study was done with scalp electrodes positioned according to the 10-20 International system of electrode placement. Electrical activity was acquired at a sampling rate of '500Hz'$  and reviewed with a high frequency filter of '70Hz'$  and a low frequency filter of '1Hz'$ . EEG data were recorded continuously and digitally stored. Description: EEG showed continuous generalized 3 to 6 Hz theta-delta slowing. Hyperventilation and photic stimulation were not performed.   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 No seizures or epileptiform discharges were seen throughout the recording. Lora Havens   ECHOCARDIOGRAM COMPLETE  Result Date: 10/09/2020    ECHOCARDIOGRAM REPORT   Patient Name:   [name] Date of Exam: 10/09/2020 Medical Rec #:  [id number]       Height:        75.0 in Accession #:    [id number]      Weight:       187.8 lb Date of Birth:  February 06, 1951       BSA:          2.137 m Patient Age:    40 years        BP:           120/88 mmHg Patient Gender: M               HR:           70 bpm. Exam Location:  [facility] Procedure: 2D Echo, Color Doppler, Cardiac Doppler and Strain Analysis Indications:     I46.9 Cardiac arrest  History:  Patient has prior history of Echocardiogram examinations, most                  recent 07/29/2020. CHF, CAD; Risk Factors:Hypertension.  Sonographer:     Charmayne Sheer Referring Phys:  [id number] Bradly Bienenstock Diagnosing Phys: Kate Sable MD  Sonographer Comments: Suboptimal subcostal window and echo performed with patient supine and on artificial respirator. Global longitudinal strain was attempted. IMPRESSIONS  1. Left ventricular ejection fraction, by estimation, is 25 to 30%. The left ventricle has severely decreased function. The left ventricle demonstrates global hypokinesis. Left ventricular diastolic parameters are consistent with Grade I diastolic dysfunction (impaired relaxation).  2. Right ventricular systolic function is moderately reduced. The right ventricular size is normal.  3. The mitral valve is normal in structure. No evidence of mitral valve regurgitation.  4. The aortic valve is tricuspid. Aortic valve regurgitation is not visualized.  5. Aortic dilatation noted. There is mild dilatation of the aortic root, measuring 39 mm.  6. The inferior vena cava is normal in size with greater than 50% respiratory variability, suggesting right atrial pressure of 3 mmHg. FINDINGS  Left Ventricle: Left ventricular ejection fraction, by estimation, is 25 to 30%. The left ventricle has severely decreased function. The left ventricle demonstrates global hypokinesis. 3D left ventricular ejection fraction analysis performed but not reported based on interpreter judgement due to suboptimal quality. The left ventricular internal cavity size  was normal in size. There is no left ventricular hypertrophy. Left ventricular diastolic parameters are consistent with Grade I diastolic dysfunction (impaired relaxation). Right Ventricle: The right ventricular size is normal. No increase in right ventricular wall thickness. Right ventricular systolic function is moderately reduced. Left Atrium: Left atrial size was normal in size. Right Atrium: Right atrial size was normal in size. Pericardium: There is no evidence of pericardial effusion. Mitral Valve: The mitral valve is normal in structure. No evidence of mitral valve regurgitation. MV peak gradient, 3.6 mmHg. The mean mitral valve gradient is 1.0 mmHg. Tricuspid Valve: The tricuspid valve is normal in structure. Tricuspid valve regurgitation is not demonstrated. Aortic Valve: The aortic valve is tricuspid. Aortic valve regurgitation is not visualized. Aortic valve mean gradient measures 5.0 mmHg. Aortic valve peak gradient measures 9.5 mmHg. Aortic valve area, by VTI measures 2.91 cm. Pulmonic Valve: The pulmonic valve was not well visualized. Pulmonic valve regurgitation is not visualized. Aorta: Aortic dilatation noted. There is mild dilatation of the aortic root, measuring 39 mm. Venous: The inferior vena cava is normal in size with greater than 50% respiratory variability, suggesting right atrial pressure of 3 mmHg. IAS/Shunts: No atrial level shunt detected by color flow Doppler.  LEFT VENTRICLE PLAX 2D LVIDd:         5.99 cm      Diastology LVIDs:         4.74 cm      LV e' medial:    4.57 cm/s LV PW:         1.29 cm      LV E/e' medial:  10.7 LV IVS:        0.85 cm      LV e' lateral:   5.55 cm/s LVOT diam:     2.60 cm      LV E/e' lateral: 8.8 LV SV:         77 LV SV Index:   36           2D Longitudinal Strain LVOT Area:  5.31 cm     2D Strain GLS Avg:     -9.3 %  LV Volumes (MOD) LV vol d, MOD A2C: 192.0 ml 3D Volume EF: LV vol d, MOD A4C: 163.0 ml 3D EF:        40 % LV vol s, MOD A2C: 110.0 ml  LV EDV:       241 ml LV vol s, MOD A4C: 84.4 ml  LV ESV:       144 ml LV SV MOD A2C:     82.0 ml  LV SV:        97 ml LV SV MOD A4C:     163.0 ml LV SV MOD BP:      78.9 ml RIGHT VENTRICLE RV Basal diam:  3.41 cm RV S prime:     13.20 cm/s LEFT ATRIUM             Index       RIGHT ATRIUM           Index LA diam:        3.50 cm 1.64 cm/m  RA Area:     15.40 cm LA Vol (A2C):   37.3 ml 17.46 ml/m RA Volume:   36.00 ml  16.85 ml/m LA Vol (A4C):   40.5 ml 18.96 ml/m LA Biplane Vol: 40.3 ml 18.86 ml/m  AORTIC VALVE                    PULMONIC VALVE AV Area (Vmax):    3.48 cm     PV Vmax:       0.91 m/s AV Area (Vmean):   3.37 cm     PV Vmean:      59.800 cm/s AV Area (VTI):     2.91 cm     PV VTI:        0.153 m AV Vmax:           154.00 cm/s  PV Peak grad:  3.3 mmHg AV Vmean:          103.000 cm/s PV Mean grad:  2.0 mmHg AV VTI:            0.265 m AV Peak Grad:      9.5 mmHg AV Mean Grad:      5.0 mmHg LVOT Vmax:         101.00 cm/s LVOT Vmean:        65.400 cm/s LVOT VTI:          0.145 m LVOT/AV VTI ratio: 0.55  AORTA Ao Root diam: 3.90 cm MITRAL VALVE MV Area (PHT): 3.36 cm    SHUNTS MV Area VTI:   3.39 cm    Systemic VTI:  0.14 m MV Peak grad:  3.6 mmHg    Systemic Diam: 2.60 cm MV Mean grad:  1.0 mmHg MV Vmax:       0.95 m/s MV Vmean:      54.1 cm/s MV Decel Time: 226 msec MV E velocity: 48.80 cm/s MV A velocity: 83.10 cm/s MV E/A ratio:  0.59 Kate Sable MD Electronically signed by Kate Sable MD Signature Date/Time: 10/09/2020/3:50:52 PM    Final    [place] EKG SITE RITE  Result Date: 10/10/2020 If Site Rite image not attached, placement could not be confirmed due to current cardiac rhythm.     Subjective:  He denies any chest pain or shortness of breath, eager to go home Discharge Exam: Vitals:   10/16/20 0700  10/16/20 0734  BP: (!) 141/97   Pulse: 74   Resp: 16   Temp:    SpO2: 98% 100%   Vitals:   10/15/20 2358 10/16/20 0634 10/16/20 0700 10/16/20 0734  BP: 127/88 (!)  164/93 (!) 141/97   Pulse: 80 79 74   Resp: '16 16 16   '$ Temp: 98.4 F (36.9 C) 98.5 F (36.9 C)    TempSrc: Oral Oral    SpO2: 97% 95% 98% 100%  Weight:      Height:        General: Pt is alert, awake, not in acute distress Cardiovascular: RRR, S1/S2 +, no rubs, no gallops Respiratory: CTA bilaterally, no wheezing, no rhonchi, AICD insertion site is bandaged, no blood or discharge can be seen through Abdominal: Soft, NT, ND, bowel sounds + Extremities: no edema, no cyanosis    The results of significant diagnostics from this hospitalization (including imaging, microbiology, ancillary and laboratory) are listed below for reference.     Microbiology: Recent Results (from the past 240 hour(s))  Resp Panel by RT-PCR (Flu A&B, Covid) Nasopharyngeal Swab     Status: None   Collection Time: 10/08/20  3:02 PM   Specimen: Nasopharyngeal Swab; Nasopharyngeal(NP) swabs in vial transport medium  Result Value Ref Range Status   SARS Coronavirus 2 by RT PCR NEGATIVE NEGATIVE Final    Comment: (NOTE) SARS-CoV-2 target nucleic acids are NOT DETECTED.  The SARS-CoV-2 RNA is generally detectable in upper respiratory specimens during the acute phase of infection. The lowest concentration of SARS-CoV-2 viral copies this assay can detect is 138 copies/mL. A negative result does not preclude SARS-Cov-2 infection and should not be used as the sole basis for treatment or other patient management decisions. A negative result may occur with  improper specimen collection/handling, submission of specimen other than nasopharyngeal swab, presence of viral mutation(s) within the areas targeted by this assay, and inadequate number of viral copies(<138 copies/mL). A negative result must be combined with clinical observations, patient history, and epidemiological information. The expected result is Negative.  Fact Sheet for Patients:  EntrepreneurPulse.com.au  Fact Sheet for Healthcare  Providers:  IncredibleEmployment.be  This test is no t yet approved or cleared by the [place] FDA and  has been authorized for detection and/or diagnosis of SARS-CoV-2 by FDA under an Emergency Use Authorization (EUA). This EUA will remain  in effect (meaning this test can be used) for the duration of the COVID-19 declaration under Section 564(b)(1) of the Act, 21 U.S.C.section 360bbb-3(b)(1), unless the authorization is terminated  or revoked sooner.       Influenza A by PCR NEGATIVE NEGATIVE Final   Influenza B by PCR NEGATIVE NEGATIVE Final    Comment: (NOTE) The Xpert Xpress SARS-CoV-2/FLU/RSV plus assay is intended as an aid in the diagnosis of influenza from Nasopharyngeal swab specimens and should not be used as a sole basis for treatment. Nasal washings and aspirates are unacceptable for Xpert Xpress SARS-CoV-2/FLU/RSV testing.  Fact Sheet for Patients: EntrepreneurPulse.com.au  Fact Sheet for Healthcare Providers: IncredibleEmployment.be  This test is not yet approved or cleared by the [place] FDA and has been authorized for detection and/or diagnosis of SARS-CoV-2 by FDA under an Emergency Use Authorization (EUA). This EUA will remain in effect (meaning this test can be used) for the duration of the COVID-19 declaration under Section 564(b)(1) of the Act, 21 U.S.C. section 360bbb-3(b)(1), unless the authorization is terminated or revoked.  Performed at [facility], [place],  [place], [place] [address]   MRSA Next Gen by PCR, Nasal     Status: None   Collection Time: 10/08/20  8:41 PM   Specimen: Nasal Mucosa; Nasal Swab  Result Value Ref Range Status   MRSA by PCR Next Gen NOT DETECTED NOT DETECTED Final    Comment: (NOTE) The GeneXpert MRSA Assay (FDA approved for NASAL specimens only), is one component of a comprehensive MRSA colonization surveillance program. It is not  intended to diagnose MRSA infection nor to guide or monitor treatment for MRSA infections. Test performance is not FDA approved in patients less than 45 years old. Performed at [facility], [address], [place], [place] [address]   Urine Culture     Status: None   Collection Time: 10/09/20  5:10 AM   Specimen: Urine, Catheterized  Result Value Ref Range Status   Specimen Description   Final    URINE, CATHETERIZED Performed at [facility], [address], [place], [place] [address]    Special Requests   Final    NONE Performed at [facility], [address] [place], [place] [address]    Culture   Final    NO GROWTH Performed at [facility], [place] [address], [place], [place] [address]    Report Status 10/10/2020 FINAL  Final  Culture, Respiratory w Gram Stain     Status: None   Collection Time: 10/09/20  5:22 AM   Specimen: Tracheal Aspirate; Respiratory  Result Value Ref Range Status   Specimen Description   Final    TRACHEAL ASPIRATE Performed at [facility], [address] [place], [place] [address]    Special Requests   Final    NONE Performed at [facility], [address] [place], [place] [address]    Gram Stain   Final    WBC PRESENT, PREDOMINANTLY PMN FEW SQUAMOUS EPITHELIAL CELLS PRESENT FEW GRAM POSITIVE COCCI RARE GRAM NEGATIVE RODS    Culture   Final    RARE Consistent with normal respiratory flora. No Pseudomonas species isolated Performed at [facility] [address] [place], [place] [address]    Report Status 10/11/2020 FINAL  Final  CULTURE, BLOOD (ROUTINE X 2) w Reflex to ID Panel     Status: None   Collection Time: 10/09/20  8:50 AM   Specimen: BLOOD RIGHT HAND  Result Value Ref Range Status   Specimen Description BLOOD RIGHT HAND  Final   Special Requests   Final    BOTTLES DRAWN AEROBIC ONLY Blood Culture results may not be optimal due to an inadequate volume of blood  received in culture bottles   Culture   Final    NO GROWTH 5 DAYS Performed at [facility], [address] [place], [place] [address]    Report Status 10/14/2020 FINAL  Final  CULTURE, BLOOD (ROUTINE X 2) w Reflex to ID Panel     Status: None   Collection Time: 10/09/20  8:50 AM   Specimen: BLOOD LEFT HAND  Result Value Ref Range Status   Specimen Description BLOOD LEFT HAND  Final   Special Requests   Final    BOTTLES DRAWN AEROBIC ONLY Blood Culture adequate volume   Culture   Final    NO GROWTH 5 DAYS Performed at [facility], [address], [place], [place] [address]    Report Status 10/14/2020 FINAL  Final  Culture, Respiratory w Gram Stain  Status: None   Collection Time: 10/12/20  9:55 AM   Specimen: Tracheal Aspirate; Respiratory  Result Value Ref Range Status   Specimen Description   Final    TRACHEAL ASPIRATE Performed at [facility], [address] [place], [place] [address]    Special Requests   Final    NONE Performed at [facility], [place], [place] [address]    Gram Stain   Final    FEW WBC PRESENT,BOTH PMN AND MONONUCLEAR RARE GRAM POSITIVE COCCI RARE GRAM NEGATIVE RODS    Culture   Final    FEW Consistent with normal respiratory flora. No Pseudomonas species isolated Performed at [facility] [address], [place], [place] [address]    Report Status 10/14/2020 FINAL  Final  Surgical PCR screen     Status: Abnormal   Collection Time: 10/15/20  8:15 AM   Specimen: Nasal Mucosa; Nasal Swab  Result Value Ref Range Status   MRSA, PCR NEGATIVE NEGATIVE Final   Staphylococcus aureus POSITIVE (A) NEGATIVE Final    Comment: (NOTE) The Xpert SA Assay (FDA approved for NASAL specimens in patients 89 years of age and older), is one component of a comprehensive surveillance program. It is not intended to diagnose infection nor to guide or monitor treatment. Performed at [facility], [place] [address], [place], [place] [address]      Labs: BNP (last 3 results) Recent Labs    10/08/20 1502  BNP 123456*   Basic Metabolic Panel: Recent Labs  Lab 10/10/20 0350 10/10/20 1525 10/11/20 0532 10/12/20 0441 10/13/20 0543 10/14/20 0507 10/15/20 0500 10/16/20 0439  NA 140  --  134* 139 137 139 140 139  K 4.7  --  3.8 3.6 4.0 4.2 3.6 3.7  CL 108  --  104 106 105 105 108 107  CO2 21*  --  '25 29 24 24 26 26  '$ GLUCOSE 74  --  401* 115* 156* 115* 110* 96  BUN 27*  --  33* 28* 32* 45* 51* 39*  CREATININE 1.95*  --  1.86* 1.88* 1.77* 1.71* 1.82* 1.61*  CALCIUM 8.2*  --  7.8* 7.9* 8.1* 8.8* 8.9 8.8*  MG 2.1 1.9 1.6* 1.7 1.9  --   --   --   PHOS 3.5 2.0* 1.8* 2.4* 3.4  --   --   --    Liver Function Tests: Recent Labs  Lab 10/10/20 0350 10/11/20 0532  AST 39 22  ALT 27 19  ALKPHOS 47 42  BILITOT 1.1 0.8  PROT 7.6 6.8  ALBUMIN 3.7 3.0*   No results for input(s): LIPASE, AMYLASE in the last 168 hours. No results for input(s): AMMONIA in the last 168 hours. CBC: Recent Labs  Lab 10/12/20 0441 10/13/20 0543 10/14/20 0507 10/15/20 0500 10/16/20 0439  WBC 7.1 7.6 9.1 8.4 7.9  NEUTROABS 5.1  --   --   --   --   HGB 12.3* 12.4* 12.8* 12.7* 13.8  HCT 36.7* 36.0* 36.6* 37.0* 40.0  MCV 90.4 89.6 88.2 87.7 87.7  PLT 104* 114* 120* 130* 145*   Cardiac Enzymes: No results for input(s): CKTOTAL, CKMB, CKMBINDEX, TROPONINI in the last 168 hours. BNP: Invalid input(s): POCBNP CBG: Recent Labs  Lab 10/13/20 1538 10/13/20 1947 10/14/20 0732 10/14/20 1140 10/14/20 1616  GLUCAP 92 112* 114* 81 92   D-Dimer No results for input(s): DDIMER in the last 72 hours. Hgb A1c No results for input(s):  HGBA1C in the last 72 hours. Lipid Profile No results for input(s): CHOL, HDL, LDLCALC, TRIG, CHOLHDL, LDLDIRECT in the last 72 hours. Thyroid function studies No results for input(s): TSH, T4TOTAL, T3FREE, THYROIDAB in the last 72 hours.  Invalid input(s):  FREET3 Anemia work up No results for input(s): VITAMINB12, FOLATE, FERRITIN, TIBC, IRON, RETICCTPCT in the last 72 hours. Urinalysis    Component Value Date/Time   COLORURINE AMBER (A) 10/08/2020 1509   APPEARANCEUR CLOUDY (A) 10/08/2020 1509   LABSPEC 1.016 10/08/2020 1509   PHURINE 5.0 10/08/2020 1509   GLUCOSEU NEGATIVE 10/08/2020 1509   HGBUR MODERATE (A) 10/08/2020 1509   BILIRUBINUR NEGATIVE 10/08/2020 1509   KETONESUR NEGATIVE 10/08/2020 1509   PROTEINUR 100 (A) 10/08/2020 1509   NITRITE NEGATIVE 10/08/2020 1509   LEUKOCYTESUR TRACE (A) 10/08/2020 1509   Sepsis Labs Invalid input(s): PROCALCITONIN,  WBC,  LACTICIDVEN Microbiology Recent Results (from the past 240 hour(s))  Resp Panel by RT-PCR (Flu A&B, Covid) Nasopharyngeal Swab     Status: None   Collection Time: 10/08/20  3:02 PM   Specimen: Nasopharyngeal Swab; Nasopharyngeal(NP) swabs in vial transport medium  Result Value Ref Range Status   SARS Coronavirus 2 by RT PCR NEGATIVE NEGATIVE Final    Comment: (NOTE) SARS-CoV-2 target nucleic acids are NOT DETECTED.  The SARS-CoV-2 RNA is generally detectable in upper respiratory specimens during the acute phase of infection. The lowest concentration of SARS-CoV-2 viral copies this assay can detect is 138 copies/mL. A negative result does not preclude SARS-Cov-2 infection and should not be used as the sole basis for treatment or other patient management decisions. A negative result may occur with  improper specimen collection/handling, submission of specimen other than nasopharyngeal swab, presence of viral mutation(s) within the areas targeted by this assay, and inadequate number of viral copies(<138 copies/mL). A negative result must be combined with clinical observations, patient history, and epidemiological information. The expected result is Negative.  Fact Sheet for Patients:  EntrepreneurPulse.com.au  Fact Sheet for Healthcare Providers:   IncredibleEmployment.be  This test is no t yet approved or cleared by the [place] FDA and  has been authorized for detection and/or diagnosis of SARS-CoV-2 by FDA under an Emergency Use Authorization (EUA). This EUA will remain  in effect (meaning this test can be used) for the duration of the COVID-19 declaration under Section 564(b)(1) of the Act, 21 U.S.C.section 360bbb-3(b)(1), unless the authorization is terminated  or revoked sooner.       Influenza A by PCR NEGATIVE NEGATIVE Final   Influenza B by PCR NEGATIVE NEGATIVE Final    Comment: (NOTE) The Xpert Xpress SARS-CoV-2/FLU/RSV plus assay is intended as an aid in the diagnosis of influenza from Nasopharyngeal swab specimens and should not be used as a sole basis for treatment. Nasal washings and aspirates are unacceptable for Xpert Xpress SARS-CoV-2/FLU/RSV testing.  Fact Sheet for Patients: EntrepreneurPulse.com.au  Fact Sheet for Healthcare Providers: IncredibleEmployment.be  This test is not yet approved or cleared by the [place] FDA and has been authorized for detection and/or diagnosis of SARS-CoV-2 by FDA under an Emergency Use Authorization (EUA). This EUA will remain in effect (meaning this test can be used) for the duration of the COVID-19 declaration under Section 564(b)(1) of the Act, 21 U.S.C. section 360bbb-3(b)(1), unless the authorization is terminated or revoked.  Performed at [facility], [address], [place], [place] [address]   MRSA Next Gen by PCR, Nasal     Status: None   Collection  Time: 10/08/20  8:41 PM   Specimen: Nasal Mucosa; Nasal Swab  Result Value Ref Range Status   MRSA by PCR Next Gen NOT DETECTED NOT DETECTED Final    Comment: (NOTE) The GeneXpert MRSA Assay (FDA approved for NASAL specimens only), is one component of a comprehensive MRSA colonization surveillance program. It is not intended to  diagnose MRSA infection nor to guide or monitor treatment for MRSA infections. Test performance is not FDA approved in patients less than 74 years old. Performed at [facility], [address] [place], [place] [address]   Urine Culture     Status: None   Collection Time: 10/09/20  5:10 AM   Specimen: Urine, Catheterized  Result Value Ref Range Status   Specimen Description   Final    URINE, CATHETERIZED Performed at [facility], [address] [place], [place] [address]    Special Requests   Final    NONE Performed at [facility], [address] [place], [place] [address]    Culture   Final    NO GROWTH Performed at [facility], [place] [address], [place], [place] [address]    Report Status 10/10/2020 FINAL  Final  Culture, Respiratory w Gram Stain     Status: None   Collection Time: 10/09/20  5:22 AM   Specimen: Tracheal Aspirate; Respiratory  Result Value Ref Range Status   Specimen Description   Final    TRACHEAL ASPIRATE Performed at [facility], [address], [place], [place] [address]    Special Requests   Final    NONE Performed at [facility], [address] [place], [place] [address]    Gram Stain   Final    WBC PRESENT, PREDOMINANTLY PMN FEW SQUAMOUS EPITHELIAL CELLS PRESENT FEW GRAM POSITIVE COCCI RARE GRAM NEGATIVE RODS    Culture   Final    RARE Consistent with normal respiratory flora. No Pseudomonas species isolated Performed at [facility] [address] [place], [place] [address]    Report Status 10/11/2020 FINAL  Final  CULTURE, BLOOD (ROUTINE X 2) w Reflex to ID Panel     Status: None   Collection Time: 10/09/20  8:50 AM   Specimen: BLOOD RIGHT HAND  Result Value Ref Range Status   Specimen Description BLOOD RIGHT HAND  Final   Special Requests   Final    BOTTLES DRAWN AEROBIC ONLY Blood Culture results may not be optimal due to an inadequate volume of blood received in  culture bottles   Culture   Final    NO GROWTH 5 DAYS Performed at [facility], [address] [place], [place] [address]    Report Status 10/14/2020 FINAL  Final  CULTURE, BLOOD (ROUTINE X 2) w Reflex to ID Panel     Status: None   Collection Time: 10/09/20  8:50 AM   Specimen: BLOOD LEFT HAND  Result Value Ref Range Status   Specimen Description BLOOD LEFT HAND  Final   Special Requests   Final    BOTTLES DRAWN AEROBIC ONLY Blood Culture adequate volume   Culture   Final    NO GROWTH 5 DAYS Performed at [facility], [address] [place],  [address]    Report Status 10/14/2020 FINAL  Final  Culture, Respiratory w Gram Stain     Status: None   Collection Time: 10/12/20  9:55 AM   Specimen: Tracheal Aspirate; Respiratory  Result Value Ref  Range Status   Specimen Description   Final    TRACHEAL ASPIRATE Performed at [facility], [address] [place], [place] [address]    Special Requests   Final    NONE Performed at [facility], [place], [place] [address]    Gram Stain   Final    FEW WBC PRESENT,BOTH PMN AND MONONUCLEAR RARE GRAM POSITIVE COCCI RARE GRAM NEGATIVE RODS    Culture   Final    FEW Consistent with normal respiratory flora. No Pseudomonas species isolated Performed at [facility] [address] [place], [place] [address]    Report Status 10/14/2020 FINAL  Final  Surgical PCR screen     Status: Abnormal   Collection Time: 10/15/20  8:15 AM   Specimen: Nasal Mucosa; Nasal Swab  Result Value Ref Range Status   MRSA, PCR NEGATIVE NEGATIVE Final   Staphylococcus aureus POSITIVE (A) NEGATIVE Final    Comment: (NOTE) The Xpert SA Assay (FDA approved for NASAL specimens in patients 32 years of age and older), is one component of a comprehensive surveillance program. It is not intended to diagnose infection nor to guide or monitor treatment. Performed at [facility], [place] [address], [place], [place] [address]      Time coordinating discharge: Over 30 minutes  SIGNED:   Phillips Climes, MD  [facility] 10/16/2020, 4:25 PM Pager   If 7PM-7AM, please contact night-coverage [email or web address] Password [email or web address]

## 2020-10-16 NOTE — Telephone Encounter (Signed)
Transition Care Management Unsuccessful Follow-up Telephone Call  Date of discharge and from where:  10/14/20 from [facility]  Attempts:  1st Attempt  Reason for unsuccessful TCM follow-up call:  Unable to reach patient

## 2020-10-16 NOTE — Consult Note (Signed)
   [facility] CM Inpatient Consult   10/16/2020  Jonathon Rios 1951-01-11 [id number]  [facility] [ACO] Patient: Theme park manager Medicare  Primary Care Provider:  Vidal Schwalbe Yvetta Coder, [place] is an Embedded provider with a Chronic Care Management team and program  Patient was screened for [facility] Management services.  Patient is showing as high risk scores for unplanned readmission risk. Patient could benefit from Embedded Chronic  Care Management team followup.  Plan: Will make referral for  [place] Management patient already transitioned home at this time.  Please contact for further questions,  Natividad Brood, RN BSN [facility] Liaison  [phone] business mobile phone Toll free office [phone]  Fax number: [phone] [email or web address]

## 2020-10-16 NOTE — Anesthesia Postprocedure Evaluation (Signed)
Anesthesia Post Note  Patient: Jonathon Rios  Procedure(s) Performed: SUBQ ICD IMPLANT     Patient location during evaluation: PACU Anesthesia Type: General Level of consciousness: awake and alert Pain management: pain level controlled Vital Signs Assessment: post-procedure vital signs reviewed and stable Respiratory status: spontaneous breathing, nonlabored ventilation and respiratory function stable Cardiovascular status: blood pressure returned to baseline and stable Postop Assessment: no apparent nausea or vomiting Anesthetic complications: no   No notable events documented.  Last Vitals:  Vitals:   10/16/20 0700 10/16/20 0734  BP: (!) 141/97   Pulse: 74   Resp: 16   Temp:    SpO2: 98% 100%    Last Pain:  Vitals:   10/16/20 0634  TempSrc: Oral  PainSc:                  [name]

## 2020-10-16 NOTE — Discharge Instructions (Signed)
  Discharge Instructions/wound care     Activity No heavy lifting or vigorous activity until cleared to at your wound check visit     DRIVING: No driving 6 months as we discussed    WOUND CARE Keep the wound area clean and dry.  Do not get this area wet , no showers for one week; you may shower on  10/23/20   . The tape/steri-strips on your wound will fall off; do not pull them off.  No additional bandage is needed on the site.  DO  NOT apply any creams, oils, or ointments to the wound area. If you notice any drainage or discharge from the wound, any swelling or bruising at the site, or you develop a fever > 101? F after you are discharged home, call the office at once.  Special Instructions You are still able to use cellular telephones; use the ear opposite the side where you have your pacemaker/defibrillator.  Avoid carrying your cellular phone near your device. When traveling through airports, show security personnel your identification card to avoid being screened in the metal detectors.  Ask the security personnel to use the hand wand. Avoid arc welding equipment, MRI testing (magnetic resonance imaging), TENS units (transcutaneous nerve stimulators).  Call the office for questions about other devices. Avoid electrical appliances that are in poor condition or are not properly grounded. Microwave ovens are safe to be near or to operate.  Additional information for defibrillator patients should your device go off: If your device goes off ONCE and you feel fine afterward, notify the device clinic nurses. If your device goes off ONCE and you do not feel well afterward, call 911. If your device goes off TWICE, call 911. If your device goes off THREE times in one day, call 911.  DO NOT DRIVE YOURSELF OR A FAMILY MEMBER WITH A DEFIBRILLATOR TO THE HOSPITAL--CALL 911.

## 2020-10-16 NOTE — Plan of Care (Signed)
  Problem: Education: Goal: Knowledge of General Education information will improve Description: Including pain rating scale, medication(s)/side effects and non-pharmacologic comfort measures 10/16/2020 1055 by Camillia Herter, RN Outcome: Adequate for Discharge 10/16/2020 0752 by Camillia Herter, RN Outcome: Progressing   Problem: Health Behavior/Discharge Planning: Goal: Ability to manage health-related needs will improve 10/16/2020 1055 by Camillia Herter, RN Outcome: Adequate for Discharge 10/16/2020 0752 by Camillia Herter, RN Outcome: Progressing   Problem: Clinical Measurements: Goal: Ability to maintain clinical measurements within normal limits will improve 10/16/2020 1055 by Camillia Herter, RN Outcome: Adequate for Discharge 10/16/2020 0752 by Camillia Herter, RN Outcome: Progressing Goal: Will remain free from infection 10/16/2020 1055 by Camillia Herter, RN Outcome: Adequate for Discharge 10/16/2020 0752 by Camillia Herter, RN Outcome: Progressing Goal: Diagnostic test results will improve 10/16/2020 1055 by Camillia Herter, RN Outcome: Adequate for Discharge 10/16/2020 0752 by Camillia Herter, RN Outcome: Progressing Goal: Respiratory complications will improve 10/16/2020 1055 by Camillia Herter, RN Outcome: Adequate for Discharge 10/16/2020 0752 by Camillia Herter, RN Outcome: Progressing Goal: Cardiovascular complication will be avoided 10/16/2020 1055 by Camillia Herter, RN Outcome: Adequate for Discharge 10/16/2020 0752 by Camillia Herter, RN Outcome: Progressing   Problem: Activity: Goal: Risk for activity intolerance will decrease 10/16/2020 1055 by Camillia Herter, RN Outcome: Adequate for Discharge 10/16/2020 0752 by Camillia Herter, RN Outcome: Progressing   Problem: Nutrition: Goal: Adequate nutrition will be maintained 10/16/2020 1055 by Camillia Herter, RN Outcome: Adequate for Discharge 10/16/2020 0752 by Camillia Herter, RN Outcome: Progressing   Problem:  Coping: Goal: Level of anxiety will decrease 10/16/2020 1055 by Camillia Herter, RN Outcome: Adequate for Discharge 10/16/2020 0752 by Camillia Herter, RN Outcome: Progressing   Problem: Elimination: Goal: Will not experience complications related to bowel motility 10/16/2020 1055 by Camillia Herter, RN Outcome: Adequate for Discharge 10/16/2020 0752 by Camillia Herter, RN Outcome: Progressing Goal: Will not experience complications related to urinary retention 10/16/2020 1055 by Camillia Herter, RN Outcome: Adequate for Discharge 10/16/2020 0752 by Camillia Herter, RN Outcome: Progressing   Problem: Pain Managment: Goal: General experience of comfort will improve 10/16/2020 1055 by Camillia Herter, RN Outcome: Adequate for Discharge 10/16/2020 0752 by Camillia Herter, RN Outcome: Progressing   Problem: Safety: Goal: Ability to remain free from injury will improve 10/16/2020 1055 by Camillia Herter, RN Outcome: Adequate for Discharge 10/16/2020 0752 by Camillia Herter, RN Outcome: Progressing   Problem: Skin Integrity: Goal: Risk for impaired skin integrity will decrease 10/16/2020 1055 by Camillia Herter, RN Outcome: Adequate for Discharge 10/16/2020 0752 by Camillia Herter, RN Outcome: Progressing   Problem: Education: Goal: Knowledge of cardiac device and self-care will improve 10/16/2020 1055 by Camillia Herter, RN Outcome: Adequate for Discharge 10/16/2020 0752 by Camillia Herter, RN Outcome: Progressing Goal: Ability to safely manage health related needs after discharge will improve 10/16/2020 1055 by Camillia Herter, RN Outcome: Adequate for Discharge 10/16/2020 0752 by Camillia Herter, RN Outcome: Progressing Goal: Individualized Educational Video(s) 10/16/2020 1055 by Camillia Herter, RN Outcome: Adequate for Discharge 10/16/2020 0752 by Camillia Herter, RN Outcome: Progressing   Problem: Cardiac: Goal: Ability to achieve and maintain adequate cardiopulmonary perfusion will  improve 10/16/2020 1055 by Camillia Herter, RN Outcome: Adequate for Discharge 10/16/2020 0752 by Camillia Herter, RN Outcome: Progressing

## 2020-10-16 NOTE — Plan of Care (Signed)

## 2020-10-16 NOTE — Progress Notes (Signed)
Progress Note  Patient Name: Jonathon Rios Date of Encounter: 10/16/2020  [facility] Cardiologist: [name], MD   Subjective  Denies pain, wants to go home  Inpatient Medications    Scheduled Meds:  (feeding supplement) PROSource Plus  30 mL Oral BID BM   aspirin EC  81 mg Oral Daily   carvedilol  6.25 mg Oral BID WC   Chlorhexidine Gluconate Cloth  6 each Topical Daily   feeding supplement  237 mL Oral TID BM   levETIRAcetam  500 mg Oral BID   mometasone-formoterol  2 puff Inhalation BID   multivitamin with minerals  1 tablet Oral Daily   mupirocin ointment  1 application Nasal BID   rosuvastatin  10 mg Oral Daily   sacubitril-valsartan  1 tablet Oral BID   Continuous Infusions:   PRN Meds: acetaminophen, acetaminophen, albuterol, diazepam, glycopyrrolate, hydrALAZINE, ondansetron (ZOFRAN) IV, traZODone   Vital Signs    Vitals:   10/15/20 2358 10/16/20 0634 10/16/20 0700 10/16/20 0734  BP: 127/88 (!) 164/93 (!) 141/97   Pulse: 80 79 74   Resp: '16 16 16   '$ Temp: 98.4 F (36.9 C) 98.5 F (36.9 C)    TempSrc: Oral Oral    SpO2: 97% 95% 98% 100%  Weight:      Height:        Intake/Output Summary (Last 24 hours) at 10/16/2020 0908 Last data filed at 10/16/2020 0847 Gross per 24 hour  Intake 1335.4 ml  Output 750 ml  Net 585.4 ml   Last 3 Weights 10/15/2020 10/14/2020 10/14/2020  Weight (lbs) 182 lb 9.6 oz 183 lb 8 oz 189 lb 13.1 oz  Weight (kg) 82.827 kg 83.235 kg 86.1 kg      Telemetry    SR, infrequent PVCs - Personally Reviewed  ECG    SR - Personally Reviewed  Physical Exam   GEN: No acute distress.   Neck: No JVD Cardiac: RRR, no murmurs, rubs, or gallops.  Respiratory: CTA b/l GI: Soft, nontender, non-distended  MS: No edema; No deformity. Neuro:  Nonfocal  Psych: Normal affect   ICD sites are stable.no hematoma, no active bleeding.  Lateral site steri strips are re-enforced with new steri-strips  Labs    High Sensitivity  Troponin:   Recent Labs  Lab 10/08/20 1502 10/08/20 1801 10/08/20 2356 10/09/20 0420 10/09/20 0850  TROPONINIHS 18* 199* 312* 201* 166*      Chemistry Recent Labs  Lab 10/10/20 0350 10/11/20 0532 10/12/20 0441 10/14/20 0507 10/15/20 0500 10/16/20 0439  NA 140 134*   < > 139 140 139  K 4.7 3.8   < > 4.2 3.6 3.7  CL 108 104   < > 105 108 107  CO2 21* 25   < > '24 26 26  '$ GLUCOSE 74 401*   < > 115* 110* 96  BUN 27* 33*   < > 45* 51* 39*  CREATININE 1.95* 1.86*   < > 1.71* 1.82* 1.61*  CALCIUM 8.2* 7.8*   < > 8.8* 8.9 8.8*  PROT 7.6 6.8  --   --   --   --   ALBUMIN 3.7 3.0*  --   --   --   --   AST 39 22  --   --   --   --   ALT 27 19  --   --   --   --   ALKPHOS 47 42  --   --   --   --  BILITOT 1.1 0.8  --   --   --   --   GFRNONAA 37* 39*   < > 43* 40* 47*  ANIONGAP 11 5   < > '10 6 6   '$ < > = values in this interval not displayed.     Hematology Recent Labs  Lab 10/14/20 0507 10/15/20 0500 10/16/20 0439  WBC 9.1 8.4 7.9  RBC 4.15* 4.22 4.56  HGB 12.8* 12.7* 13.8  HCT 36.6* 37.0* 40.0  MCV 88.2 87.7 87.7  MCH 30.8 30.1 30.3  MCHC 35.0 34.3 34.5  RDW 14.1 14.1 14.0  PLT 120* 130* 145*    BNP No results for input(s): BNP, PROBNP in the last 168 hours.    DDimer No results for input(s): DDIMER in the last 168 hours.   Radiology    No results found.  Cardiac Studies    10/09/20: TTE IMPRESSIONS   1. Left ventricular ejection fraction, by estimation, is 25 to 30%. The  left ventricle has severely decreased function. The left ventricle  demonstrates global hypokinesis. Left ventricular diastolic parameters are  consistent with Grade I diastolic  dysfunction (impaired relaxation).   2. Right ventricular systolic function is moderately reduced. The right  ventricular size is normal.   3. The mitral valve is normal in structure. No evidence of mitral valve  regurgitation.   4. The aortic valve is tricuspid. Aortic valve regurgitation is not   visualized.   5. Aortic dilatation noted. There is mild dilatation of the aortic root,  measuring 39 mm.   6. The inferior vena cava is normal in size with greater than 50%  respiratory variability, suggesting right atrial pressure of 3 mmHg.     07/30/20: R/LHC 1.  Minimal luminal irregularities with no evidence of obstructive coronary artery disease. 2.  Left ventricular angiography was not performed.  EF was 25 to 30% by echo. 3.  Right heart cath patient showed mildly elevated filling pressures, mild to moderate pulmonary hypertension and normal cardiac output  Patient Profile     70 y.o. 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 Quentin Ore Device check this morning with intact function Site is stable Wound care and activity restrictions were discussed with the patient (and his wife on speaker phone) EP follow up is in place  Pt advised, no driving 73mo made aware [place] law   2. NICM Does not appear volume OL On coreg, entresto Cardiology follow up is in place    For questions or updates, please contact [place]Please consult [email or web address] for contact info under        Signed, RBaldwin Jamaica PA-C  10/16/2020, 9:08 AM

## 2020-10-19 ENCOUNTER — Telehealth: Payer: Self-pay | Admitting: Cardiology

## 2020-10-19 NOTE — Telephone Encounter (Signed)
Returned call EC.  Started to speak with EC, when Pt wanted to speak with this nurse.  Pt asked about his bedside monitor. Questions answered.  Discussed follow up appointments.  Advised Pt contact PCP to schedule appt s/p hospitalization.    Pt indicates understanding.  No further questions.

## 2020-10-19 NOTE — Telephone Encounter (Signed)
Patient care taker calling to discuss behavior / mental status after recent implant .    Patient is aggressive and stressed out and having issues with short term memory .  Patient device was blinking yellow and is now green.   Care taker concerned about changed in temperament and if related to recent procedure.   She notes she does not want the patient to know she is calling.

## 2020-10-19 NOTE — Telephone Encounter (Signed)
Transition Care Management Unsuccessful Follow-up Telephone Call  Date of discharge and from where:  10/14/20 from [facility]  Attempts:  2nd Attempt  Reason for unsuccessful TCM follow-up call:  Left voice message

## 2020-10-20 ENCOUNTER — Telehealth: Payer: Self-pay

## 2020-10-20 NOTE — Telephone Encounter (Signed)
Transition Care Management Unsuccessful Follow-up Telephone Call  Date of discharge and from where:  10/14/20 from [facility]  Attempts:  3rd Attempt  Reason for unsuccessful TCM follow-up call:  Unable to leave message. Mailbox full.

## 2020-10-20 NOTE — Chronic Care Management (AMB) (Signed)
  Care Management   Note  10/20/2020 Name: Jonathon Rios MRN: [id number] DOB: Feb 24, 1951  Jonathon Rios is a 70 y.o. year old male who is a primary care patient of Vidal Schwalbe, Yvetta Coder, FNP and is actively engaged with the care management team. I reached out to [name] by phone today to assist with re-scheduling a follow up visit with the RN Case Manager  Follow up plan: Unsuccessful telephone outreach attempt made.  The care management team will reach out to the patient again over the next 7 days.  If patient returns call to provider office, please advise to call [facility] at [place], [place], [place], [place] [address] Direct Dial: [phone] [email or web address] Website: [email or web address]

## 2020-10-20 NOTE — Chronic Care Management (AMB) (Signed)
  Chronic Care Management   Note  10/20/2020 Name: Jonathon Rios MRN: [id number] DOB: [birth year]  Jonathon Rios is a 71 y.o. year old male who is a primary care patient of [name], FNP. I reached out to [name] by phone today in response to a referral sent by [name]'s PCP, [name], FNP      [name] was given information about Chronic Care Management services today including:  CCM service includes personalized support from designated clinical staff supervised by his physician, including individualized plan of care and coordination with other care providers 24/7 contact phone numbers for assistance for urgent and routine care needs. Service will only be billed when office clinical staff spend 20 minutes or more in a month to coordinate care. Only one practitioner may furnish and bill the service in a calendar month. The patient may stop CCM services at any time (effective at the end of the month) by phone call to the office staff. The patient will be responsible for cost sharing (co-pay) of up to 20% of the service fee (after annual deductible is met).  Patient agreed to services and verbal consent obtained.   Follow up plan: Telephone appointment with care management team member scheduled for:11/04/2020  Noreene Larsson, [place], [place], [place] [address] Direct Dial: [phone] [email or web address]: [email or web address]

## 2020-10-23 ENCOUNTER — Telehealth: Payer: Self-pay | Admitting: Family

## 2020-10-23 NOTE — Telephone Encounter (Signed)
Patient was released from the hospital last week after having a massive heart attack and is in need of a hospital follow up.No openings available until 9/12,please advise.

## 2020-10-26 NOTE — Telephone Encounter (Signed)
Call pt  Im so grateful he is home and very much look forward to seeing him.   I do not see reason to move appointment up from 9/13 as he sees as he sees cardiology 10/29/20.  Please ensure that he keeps appointment cardiology

## 2020-10-27 NOTE — Telephone Encounter (Signed)
Pt called and notified
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
## 2020-10-28 ENCOUNTER — Ambulatory Visit: Payer: Medicare Other

## 2020-10-29 ENCOUNTER — Encounter: Payer: Self-pay | Admitting: Physician Assistant

## 2020-10-29 ENCOUNTER — Ambulatory Visit (INDEPENDENT_AMBULATORY_CARE_PROVIDER_SITE_OTHER): Payer: Medicare Other | Admitting: Physician Assistant

## 2020-10-29 ENCOUNTER — Other Ambulatory Visit: Payer: Self-pay

## 2020-10-29 VITALS — BP 158/90 | HR 65 | Ht 75.0 in | Wt 182.0 lb

## 2020-10-29 DIAGNOSIS — N182 Chronic kidney disease, stage 2 (mild): Secondary | ICD-10-CM | POA: Diagnosis not present

## 2020-10-29 DIAGNOSIS — Z8679 Personal history of other diseases of the circulatory system: Secondary | ICD-10-CM | POA: Diagnosis not present

## 2020-10-29 DIAGNOSIS — Z95810 Presence of automatic (implantable) cardiac defibrillator: Secondary | ICD-10-CM | POA: Diagnosis not present

## 2020-10-29 DIAGNOSIS — I493 Ventricular premature depolarization: Secondary | ICD-10-CM

## 2020-10-29 DIAGNOSIS — I428 Other cardiomyopathies: Secondary | ICD-10-CM

## 2020-10-29 DIAGNOSIS — I2510 Atherosclerotic heart disease of native coronary artery without angina pectoris: Secondary | ICD-10-CM | POA: Diagnosis not present

## 2020-10-29 DIAGNOSIS — I5022 Chronic systolic (congestive) heart failure: Secondary | ICD-10-CM | POA: Diagnosis not present

## 2020-10-29 DIAGNOSIS — I110 Hypertensive heart disease with heart failure: Secondary | ICD-10-CM | POA: Diagnosis not present

## 2020-10-29 MED ORDER — CARVEDILOL 12.5 MG PO TABS: 12.5000 mg | ORAL_TABLET | Freq: Two times a day (BID) | ORAL | 3 refills | Status: DC

## 2020-10-29 NOTE — Patient Instructions (Signed)
Medication Instructions:  Your physician has recommended you make the following change in your medication:   INCREASE Carvedilol to 12.5 mg twice a day  *If you need a refill on your cardiac medications before your next appointment, please call your pharmacy*   Lab Work: None  If you have labs (blood work) drawn today and your tests are completely normal, you will receive your results only by: [place] (if you have MyChart) OR A paper copy in the mail If you have any lab test that is abnormal or we need to change your treatment, we will call you to review the results.   Testing/Procedures: None    Follow-Up: At [facility], you and your health needs are our priority.  As part of our continuing mission to provide you with exceptional heart care, we have created designated Provider Care Teams.  These Care Teams include your primary Cardiologist (physician) and Advanced Practice Providers (APPs -  Physician Assistants and Nurse Practitioners) who all work together to provide you with the care you need, when you need it.   Your next appointment:   1 month(s)  The format for your next appointment:   In Person  Provider:   [name], MD or [name], PA-C

## 2020-11-03 ENCOUNTER — Telehealth: Payer: Self-pay | Admitting: Cardiovascular Disease

## 2020-11-03 NOTE — Telephone Encounter (Signed)
Paperwork in nurse's box.  To be completed and returned by 11/14/20.

## 2020-11-03 NOTE — Telephone Encounter (Signed)
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

## 2020-11-04 ENCOUNTER — Ambulatory Visit: Payer: Medicare Other | Admitting: *Deleted

## 2020-11-04 DIAGNOSIS — I10 Essential (primary) hypertension: Secondary | ICD-10-CM

## 2020-11-04 DIAGNOSIS — I5022 Chronic systolic (congestive) heart failure: Secondary | ICD-10-CM

## 2020-11-04 NOTE — Patient Instructions (Signed)
Visit Information  Thank you for allowing me to share the care management and care coordination services that are available to you as part of your health plan and services through your primary care provider and medical home. Please reach out to me at [phone] if the care management/care coordination team may be of assistance to you in the future.   Hubert Azure RN, MSN RN Care Management Coordinator [place] [phone] [email or web address]

## 2020-11-04 NOTE — Chronic Care Management (AMB) (Signed)
  Care Management   Follow Up Note   11/04/2020 Name: Jonathon Rios MRN: [id number] DOB: 12-Oct-1950   Referred by: [name], FNP Reason for referral : Chronic Care Management (Initial)   Successful contact was made with the patient to discuss care management and care coordination services. Patient declines engagement at this time.   Follow Up Plan: No further follow up required: as patient declining services  Hubert Azure RN, MSN RN Care Management Coordinator [place] [phone] [email or web address]

## 2020-11-09 ENCOUNTER — Telehealth: Payer: Self-pay

## 2020-11-09 NOTE — Telephone Encounter (Signed)
Latitude alert-Multiple manual transmissions received from ICD.  Attempted to reach pt to discuss and also noted that pt missed Wound check appt on 10/28/20.  No answer.  LVM requesting callback with device clinic # and hours.

## 2020-11-10 ENCOUNTER — Ambulatory Visit (INDEPENDENT_AMBULATORY_CARE_PROVIDER_SITE_OTHER): Payer: Medicare Other | Admitting: Family

## 2020-11-10 ENCOUNTER — Other Ambulatory Visit: Payer: Self-pay

## 2020-11-10 ENCOUNTER — Encounter: Payer: Self-pay | Admitting: Family

## 2020-11-10 DIAGNOSIS — I10 Essential (primary) hypertension: Secondary | ICD-10-CM | POA: Diagnosis not present

## 2020-11-10 DIAGNOSIS — I5022 Chronic systolic (congestive) heart failure: Secondary | ICD-10-CM | POA: Diagnosis not present

## 2020-11-10 NOTE — Patient Instructions (Signed)
Absolute pleasure seeing you.   Let me know how you are doing

## 2020-11-10 NOTE — Assessment & Plan Note (Signed)
Well-controlled.  Blood pressure improved as patient rested in room.  Advised to continue carvedilol 12.5 mg twice daily, Entresto 97/23 mg twice daily as prescribed by cardiology.  He has upcoming appointment with Jonathon Rios, will follow.

## 2020-11-10 NOTE — Assessment & Plan Note (Signed)
No evidence of fluid volume overload today.  Advised patient to maintain follow-up with Dr. Quentin Ore, [name].  Continue regimen as prescribed.
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
## 2020-11-11 NOTE — Telephone Encounter (Signed)
Cardiology portion of form completed and placed on RN desk.

## 2020-11-11 NOTE — Telephone Encounter (Signed)
DMV paperwork pulled from nurse box. Placed on [name], PA's desk for further review/ completion of the cardiology section.

## 2020-11-11 NOTE — Telephone Encounter (Signed)
Patient called and made apt. For device clinic for over due wound check. Made aware not to send manual transmissions.

## 2020-11-12 ENCOUNTER — Ambulatory Visit (INDEPENDENT_AMBULATORY_CARE_PROVIDER_SITE_OTHER): Payer: Medicare Other

## 2020-11-12 ENCOUNTER — Other Ambulatory Visit: Payer: Self-pay

## 2020-11-12 DIAGNOSIS — I428 Other cardiomyopathies: Secondary | ICD-10-CM | POA: Diagnosis not present

## 2020-11-12 LAB — CUP PACEART INCLINIC DEVICE CHECK
Date Time Interrogation Session: 20220915124732
Implantable Lead Implant Date: 20220818
Implantable Lead Location: 753859
Implantable Lead Model: 3501
Implantable Lead Serial Number: 218222
Implantable Pulse Generator Implant Date: 20220818
Pulse Gen Serial Number: 166520

## 2020-11-12 NOTE — Progress Notes (Signed)
Subcutaneous ICD check in clinic. 0 untreated episodes; 0 treated episodes; 0 shocks delivered. Electrode impedance status okay. No programming changes. Remaining longevity to ERI 99%.  Error in saving attachment.   Shock plan reviewed with patient. [place] DMV driving restrictions x6 months reviewed with patient with verbal understanding.

## 2020-11-12 NOTE — Telephone Encounter (Signed)
Attempted to call the patient to advise him that the Cardiovascular Section of his DMV form has been completed by [name], PA.   No answer- I left a message to please call back.

## 2020-11-12 NOTE — Patient Instructions (Signed)
? ?  After Your ICD ?(Implantable Cardiac Defibrillator) ? ? ? ?Monitor your defibrillator site for redness, swelling, and drainage. Call the device clinic at [phone] if you experience these symptoms or fever/chills. ? ?Your incision was closed with Dermabond:  You may shower 1 day after your defibrillator implant and wash your incision with soap and water. Avoid lotions, ointments, or perfumes over your incision until it is well-healed. ? ?You may use a hot tub or a pool after your wound check appointment if the incision is completely closed. ? ?Do not lift, push or pull greater than 10 pounds with the affected arm until 6 weeks after your procedure. There are no other restrictions in arm movement after your wound check appointment. ? ?Your ICD is designed to protect you from life threatening heart rhythms. Because of this, you may receive a shock.  ? ?1 shock with no symptoms:  Call the office during business hours. ?1 shock with symptoms (chest pain, chest pressure, dizziness, lightheadedness, shortness of breath, overall feeling unwell):  Call 911. ?If you experience 2 or more shocks in 24 hours:  Call 911. ?If you receive a shock, you should not drive.  ?[place] DMV - no driving for 6 months if you receive appropriate therapy from your ICD.  ? ?ICD Alerts:  Some alerts are vibratory and others beep. These are NOT emergencies. Please call our office to let us know. If this occurs at night or on weekends, it can wait until the next business day. Send a remote transmission. ? ?If your device is capable of reading fluid status (for heart failure), you will be offered monthly monitoring to review this with you.  ? ?Remote monitoring is used to monitor your ICD from home. This monitoring is scheduled every 91 days by our office. It allows us to keep an eye on the functioning of your device to ensure it is working properly. You will routinely see your Electrophysiologist annually (more often if necessary).  ?

## 2020-11-12 NOTE — Telephone Encounter (Signed)
I spoke with the patient. I have advised him that we only fill out the Cardiovascular Section of his paperwork for the [facility], but this has been completed by [name], PA.  He is aware he may come pick this up at any time.   His completed paperwork has been placed at the front desk.  He voices understanding and is agreeable.

## 2020-11-17 ENCOUNTER — Telehealth: Payer: Self-pay | Admitting: Family
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
## 2020-11-19 NOTE — Telephone Encounter (Signed)
Are you okay with completing?

## 2020-11-20 ENCOUNTER — Encounter: Payer: Self-pay | Admitting: Physician Assistant

## 2020-11-20 ENCOUNTER — Other Ambulatory Visit: Payer: Self-pay

## 2020-11-20 ENCOUNTER — Observation Stay
Admission: EM | Admit: 2020-11-20 | Discharge: 2020-11-21 | Disposition: A | Discharge status: Home / Self Care | Payer: Medicare Other | Attending: Family Medicine | Admitting: Family Medicine

## 2020-11-20 ENCOUNTER — Emergency Department: Payer: Medicare Other

## 2020-11-20 ENCOUNTER — Ambulatory Visit (INDEPENDENT_AMBULATORY_CARE_PROVIDER_SITE_OTHER): Payer: Medicare Other | Admitting: Physician Assistant

## 2020-11-20 VITALS — BP 168/108 | HR 84 | Ht 75.0 in | Wt 188.0 lb

## 2020-11-20 DIAGNOSIS — I509 Heart failure, unspecified: Secondary | ICD-10-CM | POA: Diagnosis not present

## 2020-11-20 DIAGNOSIS — I5022 Chronic systolic (congestive) heart failure: Secondary | ICD-10-CM

## 2020-11-20 DIAGNOSIS — I428 Other cardiomyopathies: Secondary | ICD-10-CM

## 2020-11-20 DIAGNOSIS — N182 Chronic kidney disease, stage 2 (mild): Secondary | ICD-10-CM

## 2020-11-20 DIAGNOSIS — I5043 Acute on chronic combined systolic (congestive) and diastolic (congestive) heart failure: Secondary | ICD-10-CM | POA: Insufficient documentation

## 2020-11-20 DIAGNOSIS — I2510 Atherosclerotic heart disease of native coronary artery without angina pectoris: Secondary | ICD-10-CM | POA: Diagnosis not present

## 2020-11-20 DIAGNOSIS — Z95810 Presence of automatic (implantable) cardiac defibrillator: Secondary | ICD-10-CM | POA: Diagnosis not present

## 2020-11-20 DIAGNOSIS — F17210 Nicotine dependence, cigarettes, uncomplicated: Secondary | ICD-10-CM | POA: Diagnosis not present

## 2020-11-20 DIAGNOSIS — I130 Hypertensive heart and chronic kidney disease with heart failure and stage 1 through stage 4 chronic kidney disease, or unspecified chronic kidney disease: Secondary | ICD-10-CM | POA: Diagnosis not present

## 2020-11-20 DIAGNOSIS — Z79899 Other long term (current) drug therapy: Secondary | ICD-10-CM | POA: Diagnosis not present

## 2020-11-20 DIAGNOSIS — Z8679 Personal history of other diseases of the circulatory system: Secondary | ICD-10-CM

## 2020-11-20 DIAGNOSIS — R0602 Shortness of breath: Secondary | ICD-10-CM | POA: Diagnosis not present

## 2020-11-20 DIAGNOSIS — I110 Hypertensive heart disease with heart failure: Secondary | ICD-10-CM

## 2020-11-20 DIAGNOSIS — F1911 Other psychoactive substance abuse, in remission: Secondary | ICD-10-CM | POA: Diagnosis present

## 2020-11-20 DIAGNOSIS — R0603 Acute respiratory distress: Secondary | ICD-10-CM | POA: Diagnosis not present

## 2020-11-20 DIAGNOSIS — Z7982 Long term (current) use of aspirin: Secondary | ICD-10-CM | POA: Diagnosis not present

## 2020-11-20 DIAGNOSIS — J441 Chronic obstructive pulmonary disease with (acute) exacerbation: Secondary | ICD-10-CM

## 2020-11-20 DIAGNOSIS — J449 Chronic obstructive pulmonary disease, unspecified: Secondary | ICD-10-CM | POA: Diagnosis not present

## 2020-11-20 DIAGNOSIS — Z720 Tobacco use: Secondary | ICD-10-CM | POA: Diagnosis present

## 2020-11-20 DIAGNOSIS — J42 Unspecified chronic bronchitis: Secondary | ICD-10-CM | POA: Diagnosis not present

## 2020-11-20 DIAGNOSIS — N1832 Chronic kidney disease, stage 3b: Secondary | ICD-10-CM | POA: Diagnosis not present

## 2020-11-20 DIAGNOSIS — Z8673 Personal history of transient ischemic attack (TIA), and cerebral infarction without residual deficits: Secondary | ICD-10-CM | POA: Insufficient documentation

## 2020-11-20 DIAGNOSIS — I10 Essential (primary) hypertension: Secondary | ICD-10-CM | POA: Diagnosis not present

## 2020-11-20 DIAGNOSIS — E782 Mixed hyperlipidemia: Secondary | ICD-10-CM | POA: Diagnosis not present

## 2020-11-20 DIAGNOSIS — E785 Hyperlipidemia, unspecified: Secondary | ICD-10-CM | POA: Diagnosis present

## 2020-11-20 DIAGNOSIS — I5023 Acute on chronic systolic (congestive) heart failure: Secondary | ICD-10-CM

## 2020-11-20 DIAGNOSIS — J9601 Acute respiratory failure with hypoxia: Secondary | ICD-10-CM | POA: Diagnosis present

## 2020-11-20 DIAGNOSIS — B182 Chronic viral hepatitis C: Secondary | ICD-10-CM | POA: Diagnosis not present

## 2020-11-20 DIAGNOSIS — N189 Chronic kidney disease, unspecified: Secondary | ICD-10-CM | POA: Diagnosis present

## 2020-11-20 DIAGNOSIS — F1411 Cocaine abuse, in remission: Secondary | ICD-10-CM | POA: Diagnosis present

## 2020-11-20 LAB — URINALYSIS, COMPLETE (UACMP) WITH MICROSCOPIC
Bacteria, UA: NONE SEEN
Bilirubin Urine: NEGATIVE
Glucose, UA: NEGATIVE mg/dL
Ketones, ur: NEGATIVE mg/dL
Leukocytes,Ua: NEGATIVE
Nitrite: NEGATIVE
Protein, ur: 30 mg/dL — AB
RBC / HPF: >50 RBC/hpf — ABNORMAL HIGH (ref 0–5)
Specific Gravity, Urine: 1.005 (ref 1.005–1.030)
Squamous Epithelial / HPF: NONE SEEN (ref 0–5)
pH: 6 (ref 5.0–8.0)

## 2020-11-20 LAB — BASIC METABOLIC PANEL
Anion gap: 9 (ref 5–15)
BUN: 27 mg/dL — ABNORMAL HIGH (ref 8–23)
CO2: 27 mmol/L (ref 22–32)
Calcium: 8.9 mg/dL (ref 8.9–10.3)
Chloride: 103 mmol/L (ref 98–111)
Creatinine, Ser: 1.7 mg/dL — ABNORMAL HIGH (ref 0.61–1.24)
GFR, Estimated: 43 mL/min — ABNORMAL LOW (ref >60)
Glucose, Bld: 100 mg/dL — ABNORMAL HIGH (ref 70–99)
Potassium: 3.8 mmol/L (ref 3.5–5.1)
Sodium: 139 mmol/L (ref 135–145)

## 2020-11-20 LAB — CBC
HCT: 35.7 % — ABNORMAL LOW (ref 39.0–52.0)
Hemoglobin: 12 g/dL — ABNORMAL LOW (ref 13.0–17.0)
MCH: 30.8 pg (ref 26.0–34.0)
MCHC: 33.6 g/dL (ref 30.0–36.0)
MCV: 91.5 fL (ref 80.0–100.0)
Platelets: 92 10*3/uL — ABNORMAL LOW (ref 150–400)
RBC: 3.9 MIL/uL — ABNORMAL LOW (ref 4.22–5.81)
RDW: 14.6 % (ref 11.5–15.5)
WBC: 6 10*3/uL (ref 4.0–10.5)
nRBC: 0 % (ref 0.0–0.2)

## 2020-11-20 LAB — URINE DRUG SCREEN, QUALITATIVE (ARMC ONLY)
Amphetamines, Ur Screen: NOT DETECTED
Barbiturates, Ur Screen: NOT DETECTED
Benzodiazepine, Ur Scrn: NOT DETECTED
Cannabinoid 50 Ng, Ur ~~LOC~~: NOT DETECTED
Cocaine Metabolite,Ur ~~LOC~~: NOT DETECTED
MDMA (Ecstasy)Ur Screen: NOT DETECTED
Methadone Scn, Ur: NOT DETECTED
Opiate, Ur Screen: NOT DETECTED
Phencyclidine (PCP) Ur S: NOT DETECTED
Tricyclic, Ur Screen: NOT DETECTED

## 2020-11-20 LAB — TROPONIN I (HIGH SENSITIVITY)
Troponin I (High Sensitivity): 18 ng/L — ABNORMAL HIGH (ref <18)
Troponin I (High Sensitivity): 18 ng/L — ABNORMAL HIGH (ref <18)

## 2020-11-20 LAB — MAGNESIUM: Magnesium: 1.7 mg/dL (ref 1.7–2.4)

## 2020-11-20 LAB — BRAIN NATRIURETIC PEPTIDE: B Natriuretic Peptide: >4500 pg/mL — ABNORMAL HIGH (ref 0.0–100.0)

## 2020-11-20 MED ORDER — ACETAMINOPHEN 325 MG PO TABS: 650.0000 mg | ORAL_TABLET | ORAL | Status: DC | PRN

## 2020-11-20 MED ORDER — ADULT MULTIVITAMIN W/MINERALS CH
1.0000 | ORAL_TABLET | Freq: Every day | ORAL | Status: DC
  Administered 2020-11-20 – 2020-11-21 (×2): 1 via ORAL
  Filled 2020-11-20 (×2): qty 1

## 2020-11-20 MED ORDER — ALPRAZOLAM 0.5 MG PO TABS: 0.2500 mg | ORAL_TABLET | Freq: Two times a day (BID) | ORAL | Status: DC | PRN

## 2020-11-20 MED ORDER — SODIUM CHLORIDE 0.9% FLUSH
3.0000 mL | Freq: Two times a day (BID) | INTRAVENOUS | Status: DC
  Administered 2020-11-20 – 2020-11-21 (×2): 3 mL via INTRAVENOUS

## 2020-11-20 MED ORDER — CARVEDILOL 6.25 MG PO TABS
12.5000 mg | ORAL_TABLET | Freq: Two times a day (BID) | ORAL | Status: DC
  Administered 2020-11-20 – 2020-11-21 (×2): 12.5 mg via ORAL
  Filled 2020-11-20 (×2): qty 2

## 2020-11-20 MED ORDER — ALBUTEROL SULFATE HFA 108 (90 BASE) MCG/ACT IN AERS
2.0000 | INHALATION_SPRAY | Freq: Four times a day (QID) | RESPIRATORY_TRACT | Status: DC | PRN
  Filled 2020-11-20: qty 6.7

## 2020-11-20 MED ORDER — ASPIRIN EC 81 MG PO TBEC
81.0000 mg | DELAYED_RELEASE_TABLET | Freq: Every day | ORAL | Status: DC
  Administered 2020-11-21: 81 mg via ORAL
  Filled 2020-11-20: qty 1

## 2020-11-20 MED ORDER — ENOXAPARIN SODIUM 40 MG/0.4ML IJ SOSY
40.0000 mg | PREFILLED_SYRINGE | INTRAMUSCULAR | Status: DC
  Filled 2020-11-20: qty 0.4

## 2020-11-20 MED ORDER — ROSUVASTATIN CALCIUM 10 MG PO TABS: 10.0000 mg | ORAL_TABLET | Freq: Every day | ORAL | Status: DC

## 2020-11-20 MED ORDER — LEVETIRACETAM 500 MG PO TABS
500.0000 mg | ORAL_TABLET | Freq: Two times a day (BID) | ORAL | Status: DC
  Administered 2020-11-20 – 2020-11-21 (×2): 500 mg via ORAL
  Filled 2020-11-20 (×2): qty 1

## 2020-11-20 MED ORDER — ENSURE ENLIVE PO LIQD
237.0000 mL | Freq: Three times a day (TID) | ORAL | Status: DC
  Administered 2020-11-21: 237 mL via ORAL

## 2020-11-20 MED ORDER — SODIUM CHLORIDE 0.9% FLUSH: 3.0000 mL | INTRAVENOUS | Status: DC | PRN

## 2020-11-20 MED ORDER — FUROSEMIDE 10 MG/ML IJ SOLN
80.0000 mg | Freq: Once | INTRAMUSCULAR | Status: AC
  Administered 2020-11-20: 80 mg via INTRAVENOUS
  Filled 2020-11-20: qty 8

## 2020-11-20 MED ORDER — MOMETASONE FURO-FORMOTEROL FUM 200-5 MCG/ACT IN AERO
2.0000 | INHALATION_SPRAY | Freq: Two times a day (BID) | RESPIRATORY_TRACT | Status: DC
  Administered 2020-11-21: 2 via RESPIRATORY_TRACT
  Filled 2020-11-20: qty 8.8

## 2020-11-20 MED ORDER — SODIUM CHLORIDE 0.9 % IV SOLN: 250.0000 mL | INTRAVENOUS | Status: DC | PRN

## 2020-11-20 MED ORDER — ROSUVASTATIN CALCIUM 10 MG PO TABS
10.0000 mg | ORAL_TABLET | Freq: Every day | ORAL | Status: DC
  Filled 2020-11-20: qty 1

## 2020-11-20 MED ORDER — FUROSEMIDE 10 MG/ML IJ SOLN: 80.0000 mg | Freq: Every day | INTRAMUSCULAR | Status: DC

## 2020-11-20 MED ORDER — SACUBITRIL-VALSARTAN 97-103 MG PO TABS
1.0000 | ORAL_TABLET | Freq: Two times a day (BID) | ORAL | Status: DC
  Administered 2020-11-20 – 2020-11-21 (×2): 1 via ORAL
  Filled 2020-11-20 (×3): qty 1

## 2020-11-20 MED ORDER — MAGNESIUM SULFATE 2 GM/50ML IV SOLN
2.0000 g | Freq: Once | INTRAVENOUS | Status: AC
  Administered 2020-11-20: 2 g via INTRAVENOUS
  Filled 2020-11-20: qty 50

## 2020-11-20 MED ORDER — ONDANSETRON HCL 4 MG/2ML IJ SOLN: 4.0000 mg | Freq: Four times a day (QID) | INTRAMUSCULAR | Status: DC | PRN

## 2020-11-20 MED ORDER — FUROSEMIDE 10 MG/ML IJ SOLN: 40.0000 mg | Freq: Once | INTRAMUSCULAR | Status: DC

## 2020-11-20 NOTE — ED Notes (Signed)
MD Cox, Amy messaged (patient refusing COVID swab; report took test at home and it was negative)

## 2020-11-20 NOTE — Patient Instructions (Signed)
  We are sending you to the Emergency Room to be assessed for your symptoms.  We hope you feel better soon.  They will make you a follow up once discharged.
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
## 2020-11-20 NOTE — ED Notes (Signed)
Patient refused COVID swab at this time; stating "I already took one at home."

## 2020-11-20 NOTE — ED Provider Notes (Signed)
[facility] Emergency Department Provider Note  ____________________________________________   Event Date/Time   First MD Initiated Contact with Patient 11/20/20 1452     (approximate)  I have reviewed the triage vital signs and the nursing notes.   HISTORY  Chief Complaint Shortness of Breath   HPI Jonathon Rios is a 70 y.o. male  with history of HFrEF secondary to NICM, VF arrest s/p subcutaneous ICD in 09/2020, hypertensive heart disease, CKD stage II, hepatitis C s/p therapy, prior polysubstance use including IVDU in the setting of significant trauma, excessive alcohol use, prior incarceration, Covid infection in 01/2020, mildly dilated aortic root, and tobacco presents to the emergency operating referred from cardiology for further evaluation of shortness of breath worse than usual last couple days.  He states he has also had a mild nonproductive cough and endorses significant orthopnea.  He denies any new chest pain, fevers, abdominal pain, nausea, vomiting, diarrhea, urinary symptoms, rash or extremity pain.  No recent falls or injuries.  States he has been compliant with all his medications.  Denies any recent EtOH or illicit drug use.  Denies any recent tobacco abuse.         Past Medical History:  Diagnosis Date   Alcoholism and drug addiction in family    Arthritis    CHF (congestive heart failure) ([place])    Coronary artery disease    Heart disease    Hypertension    Irregular heart beat    Positive TB test    Seizure [facility])    witnessed by family in restaurant    Patient Active Problem List   Diagnosis Date Noted   Acute exacerbation of CHF (congestive heart failure) ([place]) 11/20/2020   Chronic HFrEF (heart failure with reduced ejection fraction) ([place]) 10/14/2020   NICM (nonischemic cardiomyopathy) ([place]) 10/14/2020   COPD (chronic obstructive pulmonary disease) ([place]) 10/14/2020   Malnutrition of moderate degree 10/09/2020   Acute on  chronic combined systolic and diastolic CHF (congestive heart failure) ([place]) 10/09/2020   Seizures ([place]) 10/09/2020   Endotracheally intubated 10/09/2020   Acute respiratory failure with hypoxia ([place]) 10/09/2020   Acute kidney injury superimposed on CKD ([place]) 10/09/2020   Hypoglycemia 10/09/2020   Cardiac arrest ([place]) 10/08/2020   Unstable angina (HCC)    Atherosclerosis of aorta ([place]) 07/23/2020   HLD (hyperlipidemia) 04/10/2020   CKD (chronic kidney disease) 04/10/2020   Tinea corporis 05/15/2019   Chronic hepatitis C ([place]) 11/07/2018   History of drug abuse ([place]) 10/17/2017   HTN (hypertension) 03/16/2017   Tobacco use 03/16/2017   Moderate recurrent major depression ([place]) 03/09/2017   PTSD (post-traumatic stress disorder) 03/09/2017   Cocaine abuse ([place]) 03/09/2017   History of cocaine abuse ([place]) 03/09/2017   CHF (congestive heart failure) ([place]) 03/07/2017   Acute CHF (congestive heart failure) ([place]) 01/02/2017    Past Surgical History:  Procedure Laterality Date   CARDIAC CATHETERIZATION     RIGHT/LEFT HEART CATH AND CORONARY ANGIOGRAPHY N/A 07/30/2020   Procedure: RIGHT/LEFT HEART CATH AND CORONARY ANGIOGRAPHY poss PCI;  Surgeon: Wellington Hampshire, MD;  Location: [place] [facility];  Service: Cardiovascular;  Laterality: N/A;   SUBQ ICD IMPLANT N/A 10/15/2020   Procedure: SUBQ ICD IMPLANT;  Surgeon: Vickie Epley, MD;  Location: [place] [facility];  Service: Cardiovascular;  Laterality: N/A;   TONSILLECTOMY      Prior to Admission medications   Medication Sig Start Date End Date Taking? Authorizing Provider  acetaminophen (TYLENOL) 325  MG tablet Take 2 tablets (650 mg total) by mouth every 6 (six) hours as needed for fever. 10/16/20   Elgergawy, Silver Huguenin, MD  albuterol (PROAIR HFA) 108 (90 Base) MCG/ACT inhaler Inhale 2 puffs into the lungs every 6 (six) hours as needed for wheezing or shortness of breath. 11/07/19   [name], PA-C  aspirin EC 81 MG tablet Take 1  tablet (81 mg total) by mouth daily. 07/26/18   Wellington Hampshire, MD  budesonide-formoterol (SYMBICORT) 160-4.5 MCG/ACT inhaler TAKE 2 PUFFS BY MOUTH TWICE A DAY 11/07/19   Dunn, Areta Haber, PA-C  carvedilol (COREG) 12.5 MG tablet Take 1 tablet (12.5 mg total) by mouth 2 (two) times daily. 10/29/20 01/27/21  [name], PA-C  ENTRESTO 97-103 MG TAKE 1 TABLET BY MOUTH TWICE A DAY 07/13/20   [name], PA-C  feeding supplement (ENSURE ENLIVE / ENSURE PLUS) LIQD Take 237 mLs by mouth 3 (three) times daily between meals. 10/14/20   Enzo Bi, MD  furosemide (LASIX) 20 MG tablet Take 1 tablet (20 mg total) by mouth daily as needed (As needed for swelling). 08/20/20 11/18/20  [name], PA-C  levETIRAcetam (KEPPRA) 500 MG tablet Take 1 tablet (500 mg total) by mouth 2 (two) times daily. Patient not taking: Reported on 11/20/2020 10/14/20   Enzo Bi, MD  Multiple Vitamin (MULTIVITAMIN WITH MINERALS) TABS tablet Take 1 tablet by mouth daily. 10/15/20   Enzo Bi, MD  rosuvastatin (CRESTOR) 10 MG tablet Take 1 tablet (10 mg total) by mouth daily. 01/08/20   [name], FNP    Allergies Penicillins  Family History  Problem Relation Age of Onset   Hypertension Mother    Diabetes Mother    Alcohol abuse Mother    Hyperlipidemia Mother    Hypertension Sister    Alcohol abuse Sister    Asthma Sister    Depression Sister    Drug abuse Sister    Alcohol abuse Father    Hypertension Father    Hypertension Daughter    Early death Daughter    Drug abuse Daughter    Depression Daughter    Alcohol abuse Daughter     Social History Social History   Tobacco Use   Smoking status: Some Days    Packs/day: 0.50    Years: 50.00    Pack years: 25.00    Types: Cigarettes    Last attempt to quit: 02/23/2017    Years since quitting: 3.7   Smokeless tobacco: Never  Vaping Use   Vaping Use: Never used  Substance Use Topics   Alcohol use: No   Drug use: Not Currently    Types: "Crack" cocaine,  Heroin, Marijuana, LSD    Comment: Sober since 2018.     Review of Systems  Review of Systems  Constitutional:  Negative for chills and fever.  HENT:  Negative for sore throat.   Eyes:  Negative for pain.  Respiratory:  Positive for cough and shortness of breath. Negative for stridor.   Cardiovascular:  Positive for orthopnea and leg swelling. Negative for chest pain.  Gastrointestinal:  Negative for vomiting.  Genitourinary:  Negative for dysuria.  Musculoskeletal:  Negative for myalgias.  Skin:  Negative for rash.  Neurological:  Negative for seizures, loss of consciousness and headaches.  Psychiatric/Behavioral:  Negative for suicidal ideas.   All other systems reviewed and are negative.    ____________________________________________   PHYSICAL EXAM:  VITAL SIGNS: ED Triage Vitals  Enc  Vitals Group     BP 11/20/20 1442 (!) 168/110     Pulse Rate 11/20/20 1442 79     Resp 11/20/20 1442 (!) 28     Temp 11/20/20 1442 99 F (37.2 C)     Temp Source 11/20/20 1442 Oral     SpO2 11/20/20 1442 97 %     Weight 11/20/20 1443 188 lb (85.3 kg)     Height 11/20/20 1443 6\' 3"  (1.905 m)     Head Circumference --      Peak Flow --      Pain Score 11/20/20 1443 0     Pain Loc --      Pain Edu? --      Excl. in [place]? --    Vitals:   11/20/20 1500 11/20/20 1606  BP: (!) 183/123 (!) 184/116  Pulse: 80 79  Resp:  (!) 25  Temp:    SpO2: 96% 94%   Physical Exam Vitals and nursing note reviewed.  Constitutional:      Appearance: He is well-developed.  HENT:     Head: Normocephalic and atraumatic.     Right Ear: External ear normal.     Left Ear: External ear normal.     Nose: Nose normal.  Eyes:     Conjunctiva/sclera: Conjunctivae normal.  Cardiovascular:     Rate and Rhythm: Normal rate and regular rhythm.     Heart sounds: No murmur heard. Pulmonary:     Effort: Tachypnea and respiratory distress present.     Breath sounds: Decreased breath sounds and rales present.   Abdominal:     General: There is distension.     Palpations: Abdomen is soft.     Tenderness: There is no abdominal tenderness.  Musculoskeletal:     Cervical back: Neck supple.     Right lower leg: Edema present.     Left lower leg: Edema present.  Skin:    General: Skin is warm and dry.  Neurological:     Mental Status: He is alert and oriented to person, place, and time.  Psychiatric:        Mood and Affect: Mood normal.     ____________________________________________   LABS (all labs ordered are listed, but only abnormal results are displayed)  Labs Reviewed  BASIC METABOLIC PANEL - Abnormal; Notable for the following components:      Result Value   Glucose, Bld 100 (*)    BUN 27 (*)    Creatinine, Ser 1.70 (*)    GFR, Estimated 43 (*)    All other components within normal limits  CBC - Abnormal; Notable for the following components:   RBC 3.90 (*)    Hemoglobin 12.0 (*)    HCT 35.7 (*)    Platelets 92 (*)    All other components within normal limits  BRAIN NATRIURETIC PEPTIDE - Abnormal; Notable for the following components:   B Natriuretic Peptide >4,500.0 (*)    All other components within normal limits  TROPONIN I (HIGH SENSITIVITY) - Abnormal; Notable for the following components:   Troponin I (High Sensitivity) 18 (*)    All other components within normal limits  RESP PANEL BY RT-PCR (FLU A&B, COVID) ARPGX2  MAGNESIUM  TROPONIN I (HIGH SENSITIVITY)   ____________________________________________  EKG  Sinus rhythm with a ventricular rate of 80, normal axis, unremarkable intervals with some ST inversions in ST depressions in lateral leads V5 and V6 as well as in V4 and some nonspecific changes  artifact in inferior leads. ____________________________________________  RADIOLOGY  ED MD interpretation: Chest x-ray is concerning for bilateral pulmonary edema.  No focal consolidation, effusion, pneumothorax or other clear acute thoracic process.  Official  radiology report(s): DG Chest 2 View  Result Date: 11/20/2020 CLINICAL DATA:  sob EXAM: CHEST - 2 VIEW COMPARISON:  October 16, 2020 FINDINGS: The cardiomediastinal silhouette is unchanged and enlarged in contour.LEFT chest AICD. No pleural effusion. No pneumothorax. Perihilar vascular fullness, peribronchial cuffing and diffuse interstitial prominence. Visualized abdomen is unremarkable. Multilevel degenerative changes of the thoracic spine. IMPRESSION: Constellation of findings are favored to reflect pulmonary edema. Atypical infection could present similarly. Electronically Signed   By: Valentino Saxon M.D.   On: 11/20/2020 15:41    ____________________________________________   PROCEDURES  Procedure(s) performed (including Critical Care):  .1-3 Lead EKG Interpretation Performed by: Lucrezia Starch, MD Authorized by: Lucrezia Starch, MD     Interpretation: non-specific     ECG rate assessment: normal     Rhythm: sinus rhythm     Ectopy: none     Conduction: normal     ____________________________________________   INITIAL IMPRESSION / ASSESSMENT AND PLAN / ED COURSE      Patient presents with above-stated reexam after referred to the emergency room by his cardiologist for further evaluation of some chronic shortness of breath he has had over the last couple days.  Is associate with a mild nonproductive cough and some orthopnea.  He thinks he is also possibly gained some weight as well.  On arrival he is hypertensive with a BP of 168/110 and tachypneic at 28 with otherwise stable vital signs on room air.  On exam he does appear to be in mild respiratory distress with tachypnea accessory muscle use.  He does have some lower extremity edema on exam.  Suspect likely acute on chronic heart failure exacerbation.  He has no pain and will ECG has multiple concerning findings suspect this represents some demand as troponin is 18 compared to 166 1 month ago have a low suspicion for  coronary thrombosis.  In addition he is denying any chest pain.  Chest x-ray is concerning for bilateral pulmonary edema.  No focal consolidation, effusion, pneumothorax or other clear acute thoracic process.  BNP is greater than 4500.  We will give her a dose of IV Lasix.  BMP shows stable kidney function without any other acute electrolyte or metabolic derangements.  CBC shows no leukocytosis or acute anemia.  There is evidence of thrombocytopenia.  Suspicion for PE given overall presentation more concerning for acute left-sided heart failure with pulmonary edema, rales and edema on exam as well as BNP greater than 4500.  I will plan admit to medicine service for further evaluation management of acute on chronic heart failure.      ____________________________________________   FINAL CLINICAL IMPRESSION(S) / ED DIAGNOSES  Final diagnoses:  Acute on chronic congestive heart failure, unspecified heart failure type (HCC)    Medications  magnesium sulfate IVPB 2 g 50 mL (2 g Intravenous New Bag/Given 11/20/20 1640)  rosuvastatin (CRESTOR) tablet 10 mg (has no administration in time range)  levETIRAcetam (KEPPRA) tablet 500 mg (has no administration in time range)  multivitamin with minerals tablet 1 tablet (has no administration in time range)  feeding supplement (ENSURE ENLIVE / ENSURE PLUS) liquid 237 mL (has no administration in time range)  albuterol (VENTOLIN HFA) 108 (90 Base) MCG/ACT inhaler 2 puff (has no administration in time range)  mometasone-formoterol (DULERA) 200-5  MCG/ACT inhaler 2 puff (has no administration in time range)  carvedilol (COREG) tablet 12.5 mg (has no administration in time range)  sacubitril-valsartan (ENTRESTO) 97-103 mg per tablet (has no administration in time range)  sodium chloride flush (NS) 0.9 % injection 3 mL (has no administration in time range)  sodium chloride flush (NS) 0.9 % injection 3 mL (has no administration in time range)  0.9 %  sodium  chloride infusion (has no administration in time range)  acetaminophen (TYLENOL) tablet 650 mg (has no administration in time range)  ondansetron (ZOFRAN) injection 4 mg (has no administration in time range)  furosemide (LASIX) injection 80 mg (has no administration in time range)  ALPRAZolam (XANAX) tablet 0.25 mg (has no administration in time range)  furosemide (LASIX) injection 80 mg (80 mg Intravenous Given 11/20/20 1636)     ED Discharge Orders     None        Note:  This document was prepared using Dragon voice recognition software and may include unintentional dictation errors.    Lucrezia Starch, MD 11/20/20 [phone]

## 2020-11-20 NOTE — ED Triage Notes (Signed)
Pt states that he was sent from his dr's office for further eval due to his sob, pt reports that he had cardiac arrest in aug and had a defibrillator placed, pt reports for the past 4 days he has had sob, states ems came to his house yesterday and did an ekg, pt was seen by his dr today. Pt reports that he is unable to lay flat to sleep and states isn't sleeping well due to sob. Pt has short sentences while talking

## 2020-11-20 NOTE — Progress Notes (Signed)
Patient has refused bipap. States does not wear such at home. Not interested here.

## 2020-11-20 NOTE — ED Notes (Signed)
Patient transported to X-ray 

## 2020-11-20 NOTE — ED Notes (Signed)
RN to bedside, patient audibly wheezing, oxygen saturation 91% on RA. Oxygen placed on patient, 2L [place]. Patient's oxygen saturation increased to 94%, and work of breathing decreased. MD informed.

## 2020-11-21 DIAGNOSIS — I130 Hypertensive heart and chronic kidney disease with heart failure and stage 1 through stage 4 chronic kidney disease, or unspecified chronic kidney disease: Secondary | ICD-10-CM | POA: Diagnosis not present

## 2020-11-21 DIAGNOSIS — I509 Heart failure, unspecified: Secondary | ICD-10-CM | POA: Diagnosis not present

## 2020-11-21 LAB — BASIC METABOLIC PANEL
Anion gap: 9 (ref 5–15)
BUN: 29 mg/dL — ABNORMAL HIGH (ref 8–23)
CO2: 30 mmol/L (ref 22–32)
Calcium: 8.7 mg/dL — ABNORMAL LOW (ref 8.9–10.3)
Chloride: 101 mmol/L (ref 98–111)
Creatinine, Ser: 1.52 mg/dL — ABNORMAL HIGH (ref 0.61–1.24)
GFR, Estimated: 50 mL/min — ABNORMAL LOW (ref >60)
Glucose, Bld: 99 mg/dL (ref 70–99)
Potassium: 3.9 mmol/L (ref 3.5–5.1)
Sodium: 140 mmol/L (ref 135–145)

## 2020-11-21 LAB — CBC WITH DIFFERENTIAL/PLATELET
Abs Immature Granulocytes: 0.02 10*3/uL (ref 0.00–0.07)
Basophils Absolute: 0 10*3/uL (ref 0.0–0.1)
Basophils Relative: 1 %
Eosinophils Absolute: 0.1 10*3/uL (ref 0.0–0.5)
Eosinophils Relative: 1 %
HCT: 33.5 % — ABNORMAL LOW (ref 39.0–52.0)
Hemoglobin: 11.6 g/dL — ABNORMAL LOW (ref 13.0–17.0)
Immature Granulocytes: 0 %
Lymphocytes Relative: 26 %
Lymphs Abs: 1.5 10*3/uL (ref 0.7–4.0)
MCH: 31.4 pg (ref 26.0–34.0)
MCHC: 34.6 g/dL (ref 30.0–36.0)
MCV: 90.5 fL (ref 80.0–100.0)
Monocytes Absolute: 0.6 10*3/uL (ref 0.1–1.0)
Monocytes Relative: 11 %
Neutro Abs: 3.4 10*3/uL (ref 1.7–7.7)
Neutrophils Relative %: 61 %
Platelets: 92 10*3/uL — ABNORMAL LOW (ref 150–400)
RBC: 3.7 MIL/uL — ABNORMAL LOW (ref 4.22–5.81)
RDW: 14.4 % (ref 11.5–15.5)
WBC: 5.6 10*3/uL (ref 4.0–10.5)
nRBC: 0 % (ref 0.0–0.2)

## 2020-11-21 MED ORDER — FUROSEMIDE 10 MG/ML IJ SOLN
40.0000 mg | Freq: Two times a day (BID) | INTRAMUSCULAR | Status: DC
  Administered 2020-11-21: 40 mg via INTRAVENOUS
  Filled 2020-11-21: qty 4

## 2020-11-21 MED ORDER — FUROSEMIDE 40 MG PO TABS: 40.0000 mg | ORAL_TABLET | Freq: Every day | ORAL | 5 refills | Status: DC | PRN

## 2020-11-21 MED ORDER — CARVEDILOL 12.5 MG PO TABS: 25.0000 mg | ORAL_TABLET | Freq: Two times a day (BID) | ORAL | 3 refills | Status: DC

## 2020-11-21 NOTE — Discharge Summary (Signed)
Physician Discharge Summary  Jonathon Rios HKV:[id number] DOB: 1950-11-28 DOA: 11/20/2020  PCP: [name], FNP  Admit date: 11/20/2020 Discharge date: 11/21/2020  Admitted From: Home  Disposition:  Home   Recommendations for Outpatient Follow-up:  Follow up with [name] in 1-2 weeks Ryan: Please check BMP at follow up      Home Health: None  Equipment/Devices: None new  Discharge Condition: Good  CODE STATUS: FULL Diet recommendation: Cardiac  Brief/Interim Summary: Jonathon Rios is a 70 y.o. M with NICM, sCHF EF 25-30%, recent Vfib arrest requiring CPR last month, now with ICD, HTN, substance and alcohol use disorder, hep C, and smoking who presented with SOB.  In the ER, CXR showed likely edema.  He was started on Lasix and admitted.          PRINCIPAL HOSPITAL DIAGNOSIS: Acute on chronic systolic and diastolic CHF    Discharge Diagnoses:   Acute on chronic systolic and diastolic CHF Presented with dyspnea on exertion, orthopnea, chest x-ray showed bilateral edema.  Started on Lasix, net negative 2L.  Cr improved with diuresis, K normal.  Continue aspirin, BB, Crestor, Entresto  Recommended daily weights to guide furosemide.  May need daily furosemide.  Has Cardiology follow up coming up very soon.  Emphasized this was important.     Seizures after Vfib arrest No seizures here.  COPD  No active disease  CKD IIIb Cr stable relative to baseline          Discharge Instructions  Discharge Instructions     (HEART FAILURE PATIENTS) Call MD:  Anytime you have any of the following symptoms: 1) 3 pound weight gain in 24 hours or 5 pounds in 1 week 2) shortness of breath, with or without a dry hacking cough 3) swelling in the hands, feet or stomach 4) if you have to sleep on extra pillows at night in order to breathe.   Complete by: As directed    Avoid straining   Complete by: As directed    Diet - low sodium heart healthy    Complete by: As directed    Discharge instructions   Complete by: As directed    From Dr. Loleta Books: You were admitted for a mild heart failure flare.  You were given Lasix to get the extra fluid off.  When you get home today, check your weight, this is your "dry weight" (when the heart is like that soft balloon with just the right amount of fluid in it and you don't have symptoms when you walk around)  Weigh yourself every day and write it down  You "need" it if you gain MORE than 3lbs in a single day OR if your weight is ever MORE THAN 5 lbs over your dry weight  For the next 1-2 days, you may take your Lasix if you still feel out of breath with walking  Go see [name] within the next 2 weeks   Heart Failure patients record your daily weight using the same scale at the same time of day   Complete by: As directed    Increase activity slowly   Complete by: As directed    STOP any activity that causes chest pain, shortness of breath, dizziness, sweating, or exessive weakness   Complete by: As directed       Allergies as of 11/21/2020       Reactions   Penicillins Hives   Has patient had a PCN reaction causing immediate rash, facial/tongue/throat  swelling, SOB or lightheadedness with hypotension: Yes Has patient had a PCN reaction causing severe rash involving mucus membranes or skin necrosis: No Has patient had a PCN reaction that required hospitalization: No Has patient had a PCN reaction occurring within the last 10 years: No If all of the above answers are "NO", then may proceed with Cephalosporin use.        Medication List     TAKE these medications    acetaminophen 325 MG tablet Commonly known as: TYLENOL Take 2 tablets (650 mg total) by mouth every 6 (six) hours as needed for fever.   albuterol 108 (90 Base) MCG/ACT inhaler Commonly known as: ProAir HFA Inhale 2 puffs into the lungs every 6 (six) hours as needed for wheezing or shortness of breath.   aspirin  EC 81 MG tablet Take 1 tablet (81 mg total) by mouth daily.   budesonide-formoterol 160-4.5 MCG/ACT inhaler Commonly known as: Symbicort TAKE 2 PUFFS BY MOUTH TWICE A DAY   carvedilol 12.5 MG tablet Commonly known as: COREG Take 2 tablets (25 mg total) by mouth 2 (two) times daily.   Entresto 97-103 MG Generic drug: sacubitril-valsartan TAKE 1 TABLET BY MOUTH TWICE A DAY   feeding supplement Liqd Take 237 mLs by mouth 3 (three) times daily between meals.   furosemide 40 MG tablet Commonly known as: LASIX Take 1 tablet (40 mg total) by mouth daily as needed (As needed for weight gain 3 lbs in 1 day or 5 lbs over dry weight). What changed:  medication strength how much to take reasons to take this   levETIRAcetam 500 MG tablet Commonly known as: KEPPRA Take 1 tablet (500 mg total) by mouth 2 (two) times daily.   multivitamin with minerals Tabs tablet Take 1 tablet by mouth daily.   rosuvastatin 10 MG tablet Commonly known as: Crestor Take 1 tablet (10 mg total) by mouth daily.        Follow-up Information     [name], PA-C. Go to.   Specialties: Physician Assistant, Cardiology, Radiology Contact information: [address] [place] [address] [phone]                Allergies  Allergen Reactions   Penicillins Hives    Has patient had a PCN reaction causing immediate rash, facial/tongue/throat swelling, SOB or lightheadedness with hypotension: Yes Has patient had a PCN reaction causing severe rash involving mucus membranes or skin necrosis: No Has patient had a PCN reaction that required hospitalization: No Has patient had a PCN reaction occurring within the last 10 years: No If all of the above answers are "NO", then may proceed with Cephalosporin use.     Procedures/Studies: DG Chest 2 View  Result Date: 11/20/2020 CLINICAL DATA:  sob EXAM: CHEST - 2 VIEW COMPARISON:  October 16, 2020 FINDINGS: The cardiomediastinal  silhouette is unchanged and enlarged in contour.LEFT chest AICD. No pleural effusion. No pneumothorax. Perihilar vascular fullness, peribronchial cuffing and diffuse interstitial prominence. Visualized abdomen is unremarkable. Multilevel degenerative changes of the thoracic spine. IMPRESSION: Constellation of findings are favored to reflect pulmonary edema. Atypical infection could present similarly. Electronically Signed   By: Valentino Saxon M.D.   On: 11/20/2020 15:41   CUP PACEART INCLINIC DEVICE CHECK  Result Date: 11/12/2020 Subcutaneous ICD check in clinic. 0 untreated episodes; 0 treated episodes; 0 shocks delivered. Electrode impedance status okay. No programming changes. Remaining longevity to ERI 99%. Error in saving attachment.Lavenia Atlas, BSN, RN  Subjective: Feeling better.  Peed a lot last night, no orthopnea today, walked without dyspnea or chest discomfort.  No swelling, no pain.  Discharge Exam: Vitals:   11/21/20 0930 11/21/20 1001  BP: (!) 137/99 (!) 155/94  Pulse: 69 71  Resp:  (!) 24  Temp:    SpO2: 95% 94%   Vitals:   11/21/20 0830 11/21/20 0900 11/21/20 0930 11/21/20 1001  BP: (!) 156/95 (!) 148/93 (!) 137/99 (!) 155/94  Pulse: 65 81 69 71  Resp:    (!) 24  Temp:      TempSrc:      SpO2: 93% 92% 95% 94%  Weight:      Height:        General: Jonathon Rios is alert, awake, not in acute distress, laying flat in bed Cardiovascular: RRR, nl S1-S2, no murmurs appreciated.   No LE edema.   Respiratory: Normal respiratory rate and rhythm.  CTAB without rales or wheezes. Abdominal: Abdomen soft and non-tender.  No distension or HSM.   Neuro/Psych: Strength symmetric in upper and lower extremities.  Judgment and insight appear normal.   The results of significant diagnostics from this hospitalization (including imaging, microbiology, ancillary and laboratory) are listed below for reference.     Microbiology: No results found for this or any previous visit (from  the past 240 hour(s)).   Labs: BNP (last 3 results) Recent Labs    10/08/20 1502 11/20/20 1500  BNP 1,017.7* >1,856.3*   Basic Metabolic Panel: Recent Labs  Lab 11/20/20 1502 11/20/20 1519 11/21/20 0627  NA 139  --  140  K 3.8  --  3.9  CL 103  --  101  CO2 27  --  30  GLUCOSE 100*  --  99  BUN 27*  --  29*  CREATININE 1.70*  --  1.52*  CALCIUM 8.9  --  8.7*  MG  --  1.7  --    Liver Function Tests: No results for input(s): AST, ALT, ALKPHOS, BILITOT, PROT, ALBUMIN in the last 168 hours. No results for input(s): LIPASE, AMYLASE in the last 168 hours. No results for input(s): AMMONIA in the last 168 hours. CBC: Recent Labs  Lab 11/20/20 1502 11/21/20 0627  WBC 6.0 5.6  NEUTROABS  --  3.4  HGB 12.0* 11.6*  HCT 35.7* 33.5*  MCV 91.5 90.5  PLT 92* 92*   Cardiac Enzymes: No results for input(s): CKTOTAL, CKMB, CKMBINDEX, TROPONINI in the last 168 hours. BNP: Invalid input(s): POCBNP CBG: No results for input(s): GLUCAP in the last 168 hours. D-Dimer No results for input(s): DDIMER in the last 72 hours. Hgb A1c No results for input(s): HGBA1C in the last 72 hours. Lipid Profile No results for input(s): CHOL, HDL, LDLCALC, TRIG, CHOLHDL, LDLDIRECT in the last 72 hours. Thyroid function studies No results for input(s): TSH, T4TOTAL, T3FREE, THYROIDAB in the last 72 hours.  Invalid input(s): FREET3 Anemia work up No results for input(s): VITAMINB12, FOLATE, FERRITIN, TIBC, IRON, RETICCTPCT in the last 72 hours. Urinalysis    Component Value Date/Time   COLORURINE AMBER (A) 11/20/2020 1727   APPEARANCEUR HAZY (A) 11/20/2020 1727   LABSPEC 1.005 11/20/2020 1727   PHURINE 6.0 11/20/2020 1727   GLUCOSEU NEGATIVE 11/20/2020 1727   HGBUR LARGE (A) 11/20/2020 1727   BILIRUBINUR NEGATIVE 11/20/2020 1727   KETONESUR NEGATIVE 11/20/2020 1727   PROTEINUR 30 (A) 11/20/2020 1727   NITRITE NEGATIVE 11/20/2020 1727   LEUKOCYTESUR NEGATIVE 11/20/2020 1727   Sepsis  Labs Invalid  input(s): PROCALCITONIN,  WBC,  LACTICIDVEN Microbiology No results found for this or any previous visit (from the past 240 hour(s)).   Time coordinating discharge: 25 minutes The [place] controlled substances registry was reviewed for this patient    30 Day Unplanned Readmission Risk Score    Flowsheet Row Admission (Discharged) from 10/14/2020 in [facility]  30 Day Unplanned Readmission Risk Score (%) 21.61 Filed at 10/16/2020 0801       This score is the patient's risk of an unplanned readmission within 30 days of being discharged (0 -100%). The score is based on dignosis, age, lab data, medications, orders, and past utilization.   Low:  0-14.9   Medium: 15-21.9   High: 22-29.9   Extreme: 30 and above            SIGNED:   Edwin Dada, MD  [facility] 11/21/2020, 11:07 AM

## 2020-11-21 NOTE — ED Notes (Signed)
Pt withdrawn - not wanting to engage, talk. Refused to have ecg leads placed. Wanting to be left alone.

## 2020-11-21 NOTE — ED Notes (Signed)
Pt resting in bed but ate all french toast and drank water.

## 2020-11-21 NOTE — ED Notes (Signed)
pt oob in hall ED. no distress or sob noted

## 2020-11-21 NOTE — ED Notes (Signed)
Snack provided upon request (juice, graham crackers)

## 2020-11-21 NOTE — Progress Notes (Signed)
CSW notes TOC consult for heart failure screen, CSW has informed heart failure nurse Jimsey of need for consult.   [place], [place]

## 2020-11-23 NOTE — Telephone Encounter (Signed)
Pt hospitalized 11/21/20 Please sch follow up and we can complete paperwork too

## 2020-11-23 NOTE — Telephone Encounter (Signed)
LMTCB

## 2020-11-23 NOTE — Telephone Encounter (Signed)
Pt scheduled for hospital f/u Wednesday.

## 2020-11-24 ENCOUNTER — Telehealth: Payer: Self-pay

## 2020-11-24 NOTE — Telephone Encounter (Signed)
Transition Care Management Follow-up Telephone Call Date of discharge and from where: 11/21/20 from [facility] ED Observation How have you been since you were released from the hospital? I feel a little sluggish or fatigue but I am alright. I think its more mental. I am thinking whether or not this will happen to me again, more thoughts around my death but I am not suicidal. I just usually have more energy. Denies suicidal thoughts, chest pain/pressure, headaches, SOB, wheezing and all other symptoms. Input/output appropriate.  Any questions or concerns? No  Items Reviewed: Did the pt receive and understand the discharge instructions provided? Yes  Medications obtained and verified? Yes  Any new allergies since your discharge? No  Dietary orders reviewed? Yes Do you have support at home? Yes   Home Care and Equipment/Supplies: Were home health services ordered? No  Functional Questionnaire: (I = Independent and D = Dependent) ADLs: I  Follow up appointments reviewed:  PCP Hospital f/u appt confirmed? Yes  Scheduled to see PCP on 11/25/20 @ 11:30. Are transportation arrangements needed? No  If their condition worsens, is the pt aware to call PCP or go to the Emergency Dept.? Yes Was the patient provided with contact information for the PCP's office or ED? Yes Was to pt encouraged to call back with questions or concerns? Yes

## 2020-11-25 ENCOUNTER — Ambulatory Visit (INDEPENDENT_AMBULATORY_CARE_PROVIDER_SITE_OTHER): Payer: Medicare Other | Admitting: Family

## 2020-11-25 ENCOUNTER — Other Ambulatory Visit: Payer: Self-pay

## 2020-11-25 ENCOUNTER — Encounter: Payer: Self-pay | Admitting: Family

## 2020-11-25 VITALS — BP 118/82 | HR 76 | Temp 97.6°F | Ht 75.0 in | Wt 178.8 lb

## 2020-11-25 DIAGNOSIS — I509 Heart failure, unspecified: Secondary | ICD-10-CM | POA: Diagnosis not present

## 2020-11-25 DIAGNOSIS — F4323 Adjustment disorder with mixed anxiety and depressed mood: Secondary | ICD-10-CM | POA: Diagnosis not present

## 2020-11-25 DIAGNOSIS — D649 Anemia, unspecified: Secondary | ICD-10-CM | POA: Diagnosis not present

## 2020-11-25 LAB — URINALYSIS, ROUTINE W REFLEX MICROSCOPIC
Bilirubin Urine: NEGATIVE
Ketones, ur: NEGATIVE
Leukocytes,Ua: NEGATIVE
Nitrite: NEGATIVE
Specific Gravity, Urine: >=1.03 — AB (ref 1.000–1.030)
Total Protein, Urine: 100 — AB
Urine Glucose: NEGATIVE
Urobilinogen, UA: 0.2 (ref 0.0–1.0)
pH: 5.5 (ref 5.0–8.0)

## 2020-11-25 LAB — BASIC METABOLIC PANEL
BUN: 29 mg/dL — ABNORMAL HIGH (ref 6–23)
CO2: 34 mEq/L — ABNORMAL HIGH (ref 19–32)
Calcium: 9.6 mg/dL (ref 8.4–10.5)
Chloride: 102 mEq/L (ref 96–112)
Creatinine, Ser: 1.72 mg/dL — ABNORMAL HIGH (ref 0.40–1.50)
GFR: 40.46 mL/min — ABNORMAL LOW (ref >60.00)
Glucose, Bld: 90 mg/dL (ref 70–99)
Potassium: 3.8 mEq/L (ref 3.5–5.1)
Sodium: 143 mEq/L (ref 135–145)

## 2020-11-25 LAB — IBC + FERRITIN
Ferritin: 118.4 ng/mL (ref 22.0–322.0)
Iron: 69 ug/dL (ref 42–165)
Saturation Ratios: 24.9 % (ref 20.0–50.0)
TIBC: 277.2 ug/dL (ref 250.0–450.0)
Transferrin: 198 mg/dL — ABNORMAL LOW (ref 212.0–360.0)

## 2020-11-25 NOTE — Patient Instructions (Addendum)
Please return stool cards as discussed.  I  have ordered urine today to ensure that the blood has resolved.  As discussed as well, please call cardiology, [name] to schedule follow-up  at Phone: [phone]. We discussed consideration for starting antidepressant such as Prozac or Cymbalta.  Please let me know if you consider either of these.  Always so nice to see you

## 2020-11-25 NOTE — Progress Notes (Signed)
Subjective:    Patient ID: Jonathon Rios, male    DOB: 01-31-1951, 70 y.o.   MRN: [id number]  CC: Jonathon Rios is a 70 y.o. male who presents today for follow up.   HPI: Breathing well.  No leg swelling, cough, cp, sob, orthopnea.   Using lasix 40mg  daily. Not taking daily weights.He is compliant with Coreg 12.5 mg twice daily, Entresto 97-103 mg.  He is feeling depressed, and not as happy go lucky as prior.   He keeps imagining himself when he was in 'dead state' when he had cardiac arrest and thinks about the ICU what friends saw.  No thoughts of suicide or hurting anyone else . He has work to do and loves to fish. He wants to be here and is worried 'it will happen again. '  He is not driving for 6 months per  DMV from 09/2020.   Seen 6 days ago by [name] and sent to ED for concern for acute respiratory distress.  He was admitted 11/20/2020 and discharged the following day 11/21/2020  When he arrived emergency room he was tachypneic, mild respiratory distress.  Troponin 18 (166 1 month ago).  Chest ray showed bilateral pulmonary edema. BMP 4500.  Given IV Lasix in emergency room. Started on Lasix while hospitalized.  Creatinine improved with diuretic .  Discharge recommended to continue lasix, daily weights Discharged home with Lasix 40 mg prn for weight gain of 3 pounds in 1 day or 5 pounds over dry weight    Hemoglobin 11.6 Crt 1.52 Drug screen negative  Cologuard negative 2 years ago  PSA 3 months ago 2.52    HISTORY:  Past Medical History:  Diagnosis Date   Alcoholism and drug addiction in family    Arthritis    CHF (congestive heart failure) (HCC)    Coronary artery disease    Heart disease    Hypertension    Irregular heart beat    Positive TB test    Seizure ([place])    witnessed by family in restaurant   Past Surgical History:  Procedure Laterality Date   CARDIAC CATHETERIZATION     RIGHT/LEFT HEART CATH AND CORONARY ANGIOGRAPHY N/A 07/30/2020    Procedure: RIGHT/LEFT HEART CATH AND CORONARY ANGIOGRAPHY poss PCI;  Surgeon: Wellington Hampshire, MD;  Location: [facility];  Service: Cardiovascular;  Laterality: N/A;   SUBQ ICD IMPLANT N/A 10/15/2020   Procedure: SUBQ ICD IMPLANT;  Surgeon: Vickie Epley, MD;  Location: [facility];  Service: Cardiovascular;  Laterality: N/A;   TONSILLECTOMY     Family History  Problem Relation Age of Onset   Hypertension Mother    Diabetes Mother    Alcohol abuse Mother    Hyperlipidemia Mother    Hypertension Sister    Alcohol abuse Sister    Asthma Sister    Depression Sister    Drug abuse Sister    Alcohol abuse Father    Hypertension Father    Hypertension Daughter    Early death Daughter    Drug abuse Daughter    Depression Daughter    Alcohol abuse Daughter     Allergies: Penicillins Current Outpatient Medications on File Prior to Visit  Medication Sig Dispense Refill   acetaminophen (TYLENOL) 325 MG tablet Take 2 tablets (650 mg total) by mouth every 6 (six) hours as needed for fever.     albuterol (PROAIR HFA) 108 (90 Base) MCG/ACT inhaler Inhale 2 puffs into the  lungs every 6 (six) hours as needed for wheezing or shortness of breath. 1 each 0   aspirin EC 81 MG tablet Take 1 tablet (81 mg total) by mouth daily.     budesonide-formoterol (SYMBICORT) 160-4.5 MCG/ACT inhaler TAKE 2 PUFFS BY MOUTH TWICE A DAY 1 each 0   carvedilol (COREG) 12.5 MG tablet Take 2 tablets (25 mg total) by mouth 2 (two) times daily. 60 tablet 3   ENTRESTO 97-103 MG TAKE 1 TABLET BY MOUTH TWICE A DAY 60 tablet 6   feeding supplement (ENSURE ENLIVE / ENSURE PLUS) LIQD Take 237 mLs by mouth 3 (three) times daily between meals.     furosemide (LASIX) 40 MG tablet Take 1 tablet (40 mg total) by mouth daily as needed (As needed for weight gain 3 lbs in 1 day or 5 lbs over dry weight). 30 tablet 5   levETIRAcetam (KEPPRA) 500 MG tablet Take 1 tablet (500 mg total) by mouth 2 (two) times daily.      Multiple Vitamin (MULTIVITAMIN WITH MINERALS) TABS tablet Take 1 tablet by mouth daily.     rosuvastatin (CRESTOR) 10 MG tablet Take 1 tablet (10 mg total) by mouth daily. 90 tablet 3   No current facility-administered medications on file prior to visit.    Social History   Tobacco Use   Smoking status: Some Days    Packs/day: 0.50    Years: 50.00    Pack years: 25.00    Types: Cigarettes    Last attempt to quit: 02/23/2017    Years since quitting: 3.7   Smokeless tobacco: Never  Vaping Use   Vaping Use: Never used  Substance Use Topics   Alcohol use: No   Drug use: Not Currently    Types: "Crack" cocaine, Heroin, Marijuana, LSD    Comment: Sober since 2018.     Review of Systems  Constitutional:  Negative for chills, fatigue and fever.  Respiratory:  Negative for cough and shortness of breath.   Cardiovascular:  Negative for chest pain, palpitations and leg swelling.  Gastrointestinal:  Negative for nausea and vomiting.  Psychiatric/Behavioral:  Negative for sleep disturbance and suicidal ideas. The patient is nervous/anxious.      Objective:    BP 118/82 (BP Location: Left Arm, Patient Position: Sitting, Cuff Size: Normal)   Pulse 76   Temp 97.6 F (36.4 C) (Oral)   Ht 6\' 3"  (1.905 m)   Wt 178 lb 12.8 oz (81.1 kg)   SpO2 97%   BMI 22.35 kg/m  BP Readings from Last 3 Encounters:  11/25/20 118/82  11/21/20 (!) 155/94  11/20/20 (!) 168/108   Wt Readings from Last 3 Encounters:  11/25/20 178 lb 12.8 oz (81.1 kg)  11/20/20 188 lb (85.3 kg)  11/20/20 188 lb (85.3 kg)    Physical Exam Vitals reviewed.  Constitutional:      Appearance: He is well-developed.  Cardiovascular:     Rate and Rhythm: Regular rhythm.     Heart sounds: Normal heart sounds.  Pulmonary:     Effort: Pulmonary effort is normal. No respiratory distress.     Breath sounds: Normal breath sounds. No wheezing, rhonchi or rales.  Musculoskeletal:     Right lower leg: No edema.     Left  lower leg: No edema.  Skin:    General: Skin is warm and dry.  Neurological:     Mental Status: He is alert.  Psychiatric:        Speech: Speech normal.  Behavior: Behavior normal.       Assessment & Plan:   Problem List Items Addressed This Visit       Cardiovascular and Mediastinum   Acute exacerbation of CHF (congestive heart failure) ([place])    Reviewed hospitalization. Pending BMP repeat urine analysis, iron stores in setting of anemia and hematuria. Counseled patient on importance of daily weights and advised how to use lasix 40mg  for weight gain. We agreed dry weight likely today's weight 178lbs ( down 10 lbs). No concern for fluid volume overload. Advised to make a follow up with [name].       CHF (congestive heart failure) (HCC) - Primary   Relevant Orders   Basic metabolic panel     Other   Adjustment reaction with anxiety and depression    Discussed at length cardiac arrest,fear of reoccurrence and loss of independence ( not driving for 6 months). Advised him to continue to not to drive for 6 months for his and those around him safety. Discussed grief reaction from cardiac arrest and understandable fear.  He is adamant that he has no suicidal thoughts or suicide plan.  He feels he has much more work to do.  We discussed medication management and counseling.  Patient politely  declines at this time.  I did discuss Prozac versus Cymbalta.  After recent cardiac arrest, I would appreciate cardiology input in regards to safest agent to trial and have cc'ed chart to [name], PA if patient were to decide to trial medication.       Other Visit Diagnoses     Anemia, unspecified type       Relevant Orders   Urinalysis, Routine w reflex microscopic   Urine Culture   IBC + Ferritin   Fecal occult blood, imunochemical        I am having Balen A. Minasyan maintain his aspirin EC, albuterol, budesonide-formoterol, rosuvastatin, Entresto, levETIRAcetam, feeding  supplement, multivitamin with minerals, acetaminophen, carvedilol, and furosemide.   No orders of the defined types were placed in this encounter.   Return precautions given.   Risks, benefits, and alternatives of the medications and treatment plan prescribed today were discussed, and patient expressed understanding.   Education regarding symptom management and diagnosis given to patient on AVS.  Continue to follow with [name], FNP for routine health maintenance.   [name] and I agreed with plan.   Mable Paris, FNP

## 2020-11-25 NOTE — Assessment & Plan Note (Signed)
Reviewed hospitalization. Pending BMP repeat urine analysis, iron stores in setting of anemia and hematuria. Counseled patient on importance of daily weights and advised how to use lasix 40mg  for weight gain. We agreed dry weight likely today's weight 178lbs ( down 10 lbs). No concern for fluid volume overload. Advised to make a follow up with [name].

## 2020-11-25 NOTE — Assessment & Plan Note (Signed)
Discussed at length cardiac arrest,fear of reoccurrence and loss of independence ( not driving for 6 months). Advised him to continue to not to drive for 6 months for his and those around him safety. Discussed grief reaction from cardiac arrest and understandable fear.  He is adamant that he has no suicidal thoughts or suicide plan.  He feels he has much more work to do.  We discussed medication management and counseling.  Patient politely  declines at this time.  I did discuss Prozac versus Cymbalta.  After recent cardiac arrest, I would appreciate cardiology input in regards to safest agent to trial and have cc'ed chart to [name], PA if patient were to decide to trial medication.

## 2020-11-25 NOTE — Hospital Discharge Follow-Up (Deleted)
Breathing well.  NO orthopnea.  Using lasix 40mg  daily.   He is feeling depressed, and not as happy go lucky.  He keeps imagining himself when he was in 'dead state' in the husband in ICU what friends saw.  No thoughts of suicide or hurting anyone else . He has work to do and loves to fish.  He is not driving for 6 months and DMV has asked him to no drive for 6 months from 09/2020.  Not taking weight. Seen 6 days ago by [name] and sent to ED for concern for acute respiratory distress.  He was admitted 11/20/2020 and discharged the following day 11/21/2020  When he arrived emergency room he was tachypneic, mild respiratory distress.  Troponin 18 (166 1 month ago).  Chest ray shows bilateral pulmonary edema. BMP 4500.  Given IV Lasix in emergency room. Started on Lasix while hospitalized.  Creatinine improved with diuretic .  Discharge recommended to continue lasix, daily weights  He is compliant with Coreg 12.5 mg twice daily, Entresto 97-103 mg. Compliant with Lasix 40 mg prn for weight gain of 3 pounds in 1 day or 5 pounds over dry weight  Hemoglobin 11.6 Crt 1.52 Drug screen negative  Cologuard negative 2 years ago  PSA 3 months ago 2.52 Wt Readings from Last 3 Encounters:  11/25/20 178 lb 12.8 oz (81.1 kg)  11/20/20 188 lb (85.3 kg)  11/20/20 188 lb (85.3 kg)   BP Readings from Last 3 Encounters:  11/25/20 118/82  11/21/20 (!) 155/94  11/20/20 (!) 168/108

## 2020-11-26 LAB — URINE CULTURE
MICRO NUMBER:: 12434348
SPECIMEN QUALITY:: ADEQUATE

## 2020-11-27 ENCOUNTER — Other Ambulatory Visit: Payer: Self-pay | Admitting: Family

## 2020-11-27 ENCOUNTER — Telehealth: Payer: Self-pay | Admitting: *Deleted

## 2020-11-27 ENCOUNTER — Other Ambulatory Visit: Payer: Self-pay

## 2020-11-27 ENCOUNTER — Ambulatory Visit: Payer: Medicare Other | Admitting: Physician Assistant

## 2020-11-27 DIAGNOSIS — N189 Chronic kidney disease, unspecified: Secondary | ICD-10-CM

## 2020-11-27 DIAGNOSIS — I509 Heart failure, unspecified: Secondary | ICD-10-CM

## 2020-11-27 DIAGNOSIS — R809 Proteinuria, unspecified: Secondary | ICD-10-CM

## 2020-11-27 NOTE — Telephone Encounter (Signed)
-----   Message from [name], PA-C sent at 11/27/2020  7:36 AM EDT ----- Jonathon Rios,   Can you please schedule this patient to see me in the next week or two? Thanks!

## 2020-11-27 NOTE — Telephone Encounter (Signed)
Spoke with patient and scheduled him to come in on 12/09/20 at 09:00 am to see APP here in the office. He was agreeable to this date and time with no further questions at this time.

## 2020-11-30 ENCOUNTER — Other Ambulatory Visit (INDEPENDENT_AMBULATORY_CARE_PROVIDER_SITE_OTHER): Payer: Medicare Other

## 2020-11-30 DIAGNOSIS — D649 Anemia, unspecified: Secondary | ICD-10-CM

## 2020-11-30 LAB — FECAL OCCULT BLOOD, IMMUNOCHEMICAL: Fecal Occult Bld: NEGATIVE
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
## 2020-12-09 ENCOUNTER — Other Ambulatory Visit: Payer: Self-pay

## 2020-12-09 ENCOUNTER — Ambulatory Visit (INDEPENDENT_AMBULATORY_CARE_PROVIDER_SITE_OTHER): Payer: Medicare Other | Admitting: Physician Assistant

## 2020-12-09 ENCOUNTER — Encounter: Payer: Self-pay | Admitting: Physician Assistant

## 2020-12-09 VITALS — BP 120/78 | HR 70 | Ht 74.0 in | Wt 180.0 lb

## 2020-12-09 DIAGNOSIS — I2510 Atherosclerotic heart disease of native coronary artery without angina pectoris: Secondary | ICD-10-CM

## 2020-12-09 DIAGNOSIS — I5022 Chronic systolic (congestive) heart failure: Secondary | ICD-10-CM | POA: Diagnosis not present

## 2020-12-09 DIAGNOSIS — I110 Hypertensive heart disease with heart failure: Secondary | ICD-10-CM | POA: Diagnosis not present

## 2020-12-09 DIAGNOSIS — Z95810 Presence of automatic (implantable) cardiac defibrillator: Secondary | ICD-10-CM | POA: Diagnosis not present

## 2020-12-09 DIAGNOSIS — Z8679 Personal history of other diseases of the circulatory system: Secondary | ICD-10-CM

## 2020-12-09 DIAGNOSIS — I493 Ventricular premature depolarization: Secondary | ICD-10-CM

## 2020-12-09 DIAGNOSIS — N182 Chronic kidney disease, stage 2 (mild): Secondary | ICD-10-CM | POA: Diagnosis not present

## 2020-12-09 DIAGNOSIS — I428 Other cardiomyopathies: Secondary | ICD-10-CM | POA: Diagnosis not present

## 2020-12-09 MED ORDER — DAPAGLIFLOZIN PROPANEDIOL 10 MG PO TABS: 10.0000 mg | ORAL_TABLET | Freq: Every day | ORAL | 5 refills | Status: DC

## 2020-12-09 NOTE — Patient Instructions (Signed)
Medication Instructions:   Your physician has recommended you make the following change in your medication:   START taking Farxiga 10 MG once a day before breakfast.  *If you need a refill on your cardiac medications before your next appointment, please call your pharmacy*   Lab Work:   Your physician recommends that you return for lab work (BMP) in: 1 WEEK    Please return to our office on_____________________at______________am/pm     Testing/Procedures: None ordered   Follow-Up: At [facility], you and your health needs are our priority.  As part of our continuing mission to provide you with exceptional heart care, we have created designated Provider Care Teams.  These Care Teams include your primary Cardiologist (physician) and Advanced Practice Providers (APPs -  Physician Assistants and Nurse Practitioners) who all work together to provide you with the care you need, when you need it.  We recommend signing up for the patient portal called "MyChart".  Sign up information is provided on this After Visit Summary.  MyChart is used to connect with patients for Virtual Visits (Telemedicine).  Patients are able to view lab/test results, encounter notes, upcoming appointments, etc.  Non-urgent messages can be sent to your provider as well.   To learn more about what you can do with MyChart, go to [email or web address].    Your next appointment:   1 month(s)  The format for your next appointment:   In Person  Provider:   You may see [name], MD or one of the following Advanced Practice Providers on your designated Care Team:   Murray Hodgkins, NP [name], PA-C Marrianne Mood, PA-C Cadence Kathlen Mody, [place]   Other Instructions

## 2020-12-14 ENCOUNTER — Other Ambulatory Visit: Payer: Self-pay | Admitting: Family

## 2020-12-14 DIAGNOSIS — B354 Tinea corporis: Secondary | ICD-10-CM

## 2020-12-16 ENCOUNTER — Other Ambulatory Visit: Payer: Self-pay

## 2020-12-16 ENCOUNTER — Other Ambulatory Visit (INDEPENDENT_AMBULATORY_CARE_PROVIDER_SITE_OTHER): Payer: Medicare Other

## 2020-12-16 DIAGNOSIS — I5022 Chronic systolic (congestive) heart failure: Secondary | ICD-10-CM

## 2020-12-17 LAB — BASIC METABOLIC PANEL
BUN/Creatinine Ratio: 12 (ref 10–24)
BUN: 21 mg/dL (ref 8–27)
CO2: 23 mmol/L (ref 20–29)
Calcium: 9.2 mg/dL (ref 8.6–10.2)
Chloride: 106 mmol/L (ref 96–106)
Creatinine, Ser: 1.7 mg/dL — ABNORMAL HIGH (ref 0.76–1.27)
Glucose: 82 mg/dL (ref 70–99)
Potassium: 4.8 mmol/L (ref 3.5–5.2)
Sodium: 142 mmol/L (ref 134–144)
eGFR: 43 mL/min/{1.73_m2} — ABNORMAL LOW (ref >59)

## 2020-12-18 ENCOUNTER — Other Ambulatory Visit: Payer: Self-pay | Admitting: Family

## 2020-12-23 ENCOUNTER — Telehealth: Payer: Self-pay | Admitting: Family

## 2020-12-23 NOTE — Telephone Encounter (Signed)
Patient called in stating he received a call from our office but he is unsure if it was a reminder call or a call from the [facility].Please advise.

## 2020-12-25 ENCOUNTER — Other Ambulatory Visit: Payer: Self-pay | Admitting: Family

## 2020-12-25 DIAGNOSIS — B354 Tinea corporis: Secondary | ICD-10-CM

## 2020-12-25 NOTE — Telephone Encounter (Signed)
Yes may have him come in for UA No need to repeat BMP  I still strongly urge him to see nephrology due to ckd  Would he agree to this now? ( He declined previously)

## 2020-12-28 ENCOUNTER — Other Ambulatory Visit: Payer: Self-pay

## 2020-12-28 ENCOUNTER — Other Ambulatory Visit (INDEPENDENT_AMBULATORY_CARE_PROVIDER_SITE_OTHER): Payer: Medicare Other

## 2020-12-28 DIAGNOSIS — R809 Proteinuria, unspecified: Secondary | ICD-10-CM | POA: Diagnosis not present

## 2020-12-28 LAB — URINALYSIS, ROUTINE W REFLEX MICROSCOPIC
Bilirubin Urine: NEGATIVE
Ketones, ur: NEGATIVE
Leukocytes,Ua: NEGATIVE
Nitrite: NEGATIVE
Specific Gravity, Urine: 1.02 (ref 1.000–1.030)
Total Protein, Urine: 100 — AB
Urine Glucose: >=1000 — AB
Urobilinogen, UA: 0.2 (ref 0.0–1.0)
pH: 6 (ref 5.0–8.0)

## 2020-12-28 LAB — MICROALBUMIN / CREATININE URINE RATIO
Creatinine,U: 60.8 mg/dL
Microalb Creat Ratio: 94.6 mg/g — ABNORMAL HIGH (ref 0.0–30.0)
Microalb, Ur: 57.5 mg/dL — ABNORMAL HIGH (ref 0.0–1.9)

## 2020-12-28 NOTE — Telephone Encounter (Signed)
Pt scheduled today for urine studies.

## 2020-12-29 LAB — URINE CULTURE
MICRO NUMBER:: 12572379
SPECIMEN QUALITY:: ADEQUATE
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
## 2021-01-11 ENCOUNTER — Encounter: Payer: Self-pay | Admitting: Physician Assistant

## 2021-01-11 ENCOUNTER — Ambulatory Visit (INDEPENDENT_AMBULATORY_CARE_PROVIDER_SITE_OTHER): Payer: Medicare Other | Admitting: Physician Assistant

## 2021-01-11 ENCOUNTER — Other Ambulatory Visit: Payer: Self-pay

## 2021-01-11 VITALS — BP 144/90 | HR 77 | Ht 75.0 in | Wt 185.0 lb

## 2021-01-11 DIAGNOSIS — I110 Hypertensive heart disease with heart failure: Secondary | ICD-10-CM | POA: Diagnosis not present

## 2021-01-11 DIAGNOSIS — I2510 Atherosclerotic heart disease of native coronary artery without angina pectoris: Secondary | ICD-10-CM | POA: Diagnosis not present

## 2021-01-11 DIAGNOSIS — I5022 Chronic systolic (congestive) heart failure: Secondary | ICD-10-CM

## 2021-01-11 DIAGNOSIS — Z95810 Presence of automatic (implantable) cardiac defibrillator: Secondary | ICD-10-CM

## 2021-01-11 DIAGNOSIS — Z8679 Personal history of other diseases of the circulatory system: Secondary | ICD-10-CM

## 2021-01-11 DIAGNOSIS — N182 Chronic kidney disease, stage 2 (mild): Secondary | ICD-10-CM

## 2021-01-11 DIAGNOSIS — I428 Other cardiomyopathies: Secondary | ICD-10-CM

## 2021-01-11 DIAGNOSIS — I493 Ventricular premature depolarization: Secondary | ICD-10-CM | POA: Diagnosis not present

## 2021-01-11 NOTE — Patient Instructions (Addendum)
Medication Instructions:  No changes at this time.   *If you need a refill on your cardiac medications before your next appointment, please call your pharmacy*   Lab Work: Bmet today   If you have labs (blood work) drawn today and your tests are completely normal, you will receive your results only by: [place] (if you have MyChart) OR A paper copy in the mail If you have any lab test that is abnormal or we need to change your treatment, we will call you to review the results.   Testing/Procedures: None   Follow-Up: At [facility], you and your health needs are our priority.  As part of our continuing mission to provide you with exceptional heart care, we have created designated Provider Care Teams.  These Care Teams include your primary Cardiologist (physician) and Advanced Practice Providers (APPs -  Physician Assistants and Nurse Practitioners) who all work together to provide you with the care you need, when you need it.  Your next appointment:   6 month(s)  The format for your next appointment:   In Person  Provider:   [name], PA-C   Other Instructions You have a scheduled appointment to see Dr. Quentin Ore on  01/20/21 at 11:40 am here in our office.

## 2021-01-12 ENCOUNTER — Encounter: Payer: Self-pay | Admitting: *Deleted

## 2021-01-12 LAB — BASIC METABOLIC PANEL
BUN/Creatinine Ratio: 12 (ref 10–24)
BUN: 21 mg/dL (ref 8–27)
CO2: 22 mmol/L (ref 20–29)
Calcium: 9.4 mg/dL (ref 8.6–10.2)
Chloride: 105 mmol/L (ref 96–106)
Creatinine, Ser: 1.7 mg/dL — ABNORMAL HIGH (ref 0.76–1.27)
Glucose: 79 mg/dL (ref 70–99)
Potassium: 4.1 mmol/L (ref 3.5–5.2)
Sodium: 144 mmol/L (ref 134–144)
eGFR: 43 mL/min/{1.73_m2} — ABNORMAL LOW (ref >59)

## 2021-01-15 ENCOUNTER — Ambulatory Visit: Payer: Medicare Other

## 2021-01-15 DIAGNOSIS — I5022 Chronic systolic (congestive) heart failure: Secondary | ICD-10-CM

## 2021-01-15 DIAGNOSIS — I428 Other cardiomyopathies: Secondary | ICD-10-CM

## 2021-01-18 LAB — CUP PACEART REMOTE DEVICE CHECK
Battery Remaining Percentage: 98 %
Date Time Interrogation Session: 20221118230700
Implantable Lead Implant Date: 20220818
Implantable Lead Location: 753859
Implantable Lead Model: 3501
Implantable Lead Serial Number: 218222
Implantable Pulse Generator Implant Date: 20220818
Pulse Gen Serial Number: 166520

## 2021-01-20 ENCOUNTER — Other Ambulatory Visit: Payer: Self-pay

## 2021-01-20 ENCOUNTER — Encounter: Payer: Self-pay | Admitting: Cardiology

## 2021-01-20 ENCOUNTER — Ambulatory Visit (INDEPENDENT_AMBULATORY_CARE_PROVIDER_SITE_OTHER): Payer: Medicare Other | Admitting: Cardiology

## 2021-01-20 VITALS — BP 126/74 | HR 76 | Ht 74.5 in | Wt 186.0 lb

## 2021-01-20 DIAGNOSIS — I10 Essential (primary) hypertension: Secondary | ICD-10-CM | POA: Diagnosis not present

## 2021-01-20 DIAGNOSIS — Z95810 Presence of automatic (implantable) cardiac defibrillator: Secondary | ICD-10-CM

## 2021-01-20 DIAGNOSIS — I469 Cardiac arrest, cause unspecified: Secondary | ICD-10-CM

## 2021-01-20 DIAGNOSIS — I5022 Chronic systolic (congestive) heart failure: Secondary | ICD-10-CM

## 2021-01-20 DIAGNOSIS — I428 Other cardiomyopathies: Secondary | ICD-10-CM

## 2021-01-20 LAB — CUP PACEART INCLINIC DEVICE CHECK
Date Time Interrogation Session: 20221123153902
Implantable Lead Implant Date: 20220818
Implantable Lead Location: 753859
Implantable Lead Model: 3501
Implantable Lead Serial Number: 218222
Implantable Pulse Generator Implant Date: 20220818
Pulse Gen Serial Number: 166520

## 2021-01-20 LAB — PACEMAKER DEVICE OBSERVATION

## 2021-01-20 NOTE — Patient Instructions (Addendum)
Medication Instructions:  Your physician recommends that you continue on your current medications as directed. Please refer to the Current Medication list given to you today. *If you need a refill on your cardiac medications before your next appointment, please call your pharmacy*  Lab Work: None ordered. If you have labs (blood work) drawn today and your tests are completely normal, you will receive your results only by: [place] (if you have MyChart) OR A paper copy in the mail If you have any lab test that is abnormal or we need to change your treatment, we will call you to review the results.  Testing/Procedures: None ordered.  Follow-Up: At [facility], you and your health needs are our priority.  As part of our continuing mission to provide you with exceptional heart care, we have created designated Provider Care Teams.  These Care Teams include your primary Cardiologist (physician) and Advanced Practice Providers (APPs -  Physician Assistants and Nurse Practitioners) who all work together to provide you with the care you need, when you need it.  Your next appointment:   Your physician wants you to follow-up in: 9 months with Dr. Quentin Ore.   You will receive a reminder letter in the mail two months in advance. If you don't receive a letter, please call our office to schedule the follow-up appointment.  Remote monitoring is used to monitor your ICD from home. This monitoring reduces the number of office visits required to check your device to one time per year. It allows [place] to keep an eye on the functioning of your device to ensure it is working properly. You are scheduled for a device check from home on 04/16/2021. You may send your transmission at any time that day. If you have a wireless device, the transmission will be sent automatically. After your physician reviews your transmission, you will receive a postcard with your next transmission date.

## 2021-01-20 NOTE — Progress Notes (Signed)
Electrophysiology Office Follow up Visit Note:    Date:  01/20/2021   ID:  Jonathon Rios, DOB 1950/10/03, MRN [id number]  PCP:  [name], [facility] Cardiologist:  [name], MD  [facility] Electrophysiologist:  Vickie Epley, MD    Interval History:    Jonathon Rios is a 70 y.o. male who presents for a follow up visit.  He had a subcutaneous ICD implanted October 15, 2020.  Device checks since then shows stable lead parameters without delivered ICD therapies.  He presents for follow-up.  He initially presented to [facility] with a cardiac arrest.  He was found to be in ventricular fibrillation and required multiple rounds of ACLS.  He was transferred to main campus where I placed a subcutaneous ICD.  Patient has done quite well since I saw him.  He is recovering well.  He wants to get back into bowling.       Past Medical History:  Diagnosis Date   Alcoholism and drug addiction in family    Arthritis    CHF (congestive heart failure) ([place])    Coronary artery disease    Heart disease    Hypertension    Irregular heart beat    Positive TB test    Seizure [facility])    witnessed by family in restaurant    Past Surgical History:  Procedure Laterality Date   CARDIAC CATHETERIZATION     RIGHT/LEFT HEART CATH AND CORONARY ANGIOGRAPHY N/A 07/30/2020   Procedure: RIGHT/LEFT HEART CATH AND CORONARY ANGIOGRAPHY poss PCI;  Surgeon: Wellington Hampshire, MD;  Location: [facility];  Service: Cardiovascular;  Laterality: N/A;   SUBQ ICD IMPLANT N/A 10/15/2020   Procedure: SUBQ ICD IMPLANT;  Surgeon: Vickie Epley, MD;  Location: [facility];  Service: Cardiovascular;  Laterality: N/A;   TONSILLECTOMY      Current Medications: Current Meds  Medication Sig   acetaminophen (TYLENOL) 325 MG tablet Take 2 tablets (650 mg total) by mouth every 6 (six) hours as needed for fever.   albuterol (PROAIR HFA) 108 (90 Base) MCG/ACT inhaler Inhale 2  puffs into the lungs every 6 (six) hours as needed for wheezing or shortness of breath.   aspirin EC 81 MG tablet Take 1 tablet (81 mg total) by mouth daily.   budesonide-formoterol (SYMBICORT) 160-4.5 MCG/ACT inhaler TAKE 2 PUFFS BY MOUTH TWICE A DAY   carvedilol (COREG) 12.5 MG tablet Take 2 tablets (25 mg total) by mouth 2 (two) times daily.   dapagliflozin propanediol (FARXIGA) 10 MG TABS tablet Take 1 tablet (10 mg total) by mouth daily before breakfast.   ENTRESTO 97-103 MG TAKE 1 TABLET BY MOUTH TWICE A DAY   feeding supplement (ENSURE ENLIVE / ENSURE PLUS) LIQD Take 237 mLs by mouth 3 (three) times daily between meals.   furosemide (LASIX) 40 MG tablet Take 1 tablet (40 mg total) by mouth daily as needed (As needed for weight gain 3 lbs in 1 day or 5 lbs over dry weight).   Multiple Vitamin (MULTIVITAMIN WITH MINERALS) TABS tablet Take 1 tablet by mouth daily.   rosuvastatin (CRESTOR) 10 MG tablet TAKE 1 TABLET BY MOUTH EVERY DAY     Allergies:   Penicillins   Social History   Socioeconomic History   Marital status: Legally Separated    Spouse name: Not on file   Number of children: 4   Years of education: 12   Highest education level: High school  graduate  Occupational History   Not on file  Tobacco Use   Smoking status: Some Days    Packs/day: 0.50    Years: 50.00    Pack years: 25.00    Types: Cigarettes    Last attempt to quit: 02/23/2017    Years since quitting: 3.9   Smokeless tobacco: Never  Vaping Use   Vaping Use: Never used  Substance and Sexual Activity   Alcohol use: No   Drug use: Not Currently    Types: "Crack" cocaine, Heroin, Marijuana, LSD    Comment: Sober since 2018.    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    Raped at 70 years old   Social Determinants of Radio broadcast assistant Strain: Not on file  Food Insecurity: No Food Insecurity   Worried About Charity fundraiser in the Last Year: Never true   Arboriculturist in the Last Year: Never true  Transportation Needs: No Transportation Needs   Lack of Transportation (Medical): No   Lack of Transportation (Non-Medical): No  Physical Activity: Sufficiently Active   Days of Exercise per Week: 7 days   Minutes of Exercise per Session: 30 min  Stress: No Stress Concern Present   Feeling of Stress : Not at all  Social Connections: Socially Isolated   Frequency of Communication with Friends and Family: Twice a week   Frequency of Social Gatherings with Friends and Family: Never   Attends Religious Services: Never   Printmaker: No   Attends Music therapist: Never   Marital Status: Separated     Family History: The patient's family history includes Alcohol abuse in his daughter, father, mother, and sister; Asthma in his sister; Depression in his daughter and sister; Diabetes in his mother; Drug abuse in his daughter and sister; Early death in his daughter; Hyperlipidemia in his mother; Hypertension in his daughter, father, mother, and sister.  ROS:   Please see the history of present illness.    All other systems reviewed and are negative.  EKGs/Labs/Other Studies Reviewed:    The following studies were reviewed today:  January 20, 2021 in clinic device interrogation personally reviewed shows a stable lead parameter.  Shock impedance 50 ohms.  Battery longevity 98%.  No delivered therapies.    Recent Labs: 10/10/2020: TSH 0.615 10/11/2020: ALT 19 11/20/2020: B Natriuretic Peptide >4,500.0; Magnesium 1.7 11/21/2020: Hemoglobin 11.6; Platelets 92 01/11/2021: BUN 21; Creatinine,  Ser 1.70; Potassium 4.1; Sodium 144  Recent Lipid Panel    Component Value Date/Time   CHOL 119 04/21/2020 1022   TRIG 481 (H) 10/13/2020 0543   HDL 45.40 04/21/2020 1022   CHOLHDL 3 04/21/2020 1022   VLDL 15.4 04/21/2020 1022   LDLCALC 58 04/21/2020 1022   LDLDIRECT 98.0 03/22/2018 1433    Physical Exam:    VS:  BP 126/74   Pulse 76   Ht 6' 2.5" (1.892 m)   Wt 186 lb (84.4 kg)   SpO2 97%   BMI 23.56 kg/m  Wt Readings from Last 3 Encounters:  01/20/21 186 lb (84.4 kg)  01/11/21 185 lb (83.9 kg)  12/09/20 180 lb (81.6 kg)     GEN:  Well nourished, well developed in no acute distress HEENT: Normal NECK: No JVD; No carotid bruits LYMPHATICS: No lymphadenopathy CARDIAC: RRR, no murmurs, rubs, gallops.  Subcutaneous ICD incisions well-healed RESPIRATORY:  Clear to auscultation without rales, wheezing or rhonchi  ABDOMEN: Soft, non-tender, non-distended MUSCULOSKELETAL:  No edema; No deformity  SKIN: Warm and dry NEUROLOGIC:  Alert and oriented x 3 PSYCHIATRIC:  Normal affect        ASSESSMENT:    1. Chronic HFrEF (heart failure with reduced ejection fraction) ([place])   2. Cardiac arrest ([place])   3. NICM (nonischemic cardiomyopathy) ([place])   4. Primary hypertension   5. ICD (implantable cardioverter-defibrillator) in place    PLAN:    In order of problems listed above:  #Chronic systolic heart failure NYHA class II. Is following with [name] who is adjusting his heart failure medications.  Warm and dry on exam today.  On Lasix, Farxiga, [place], [place].  Continue close follow-up with [name] and the rest of the cardiology team.  #History of cardiac arrest/VF No recurrent episode.  No delivered therapies from his ICD since implant.  Device functioning appropriately  #Subcutaneous ICD in situ Device functioning appropriately. continue remote monitoring.  Follow-up 9 months with me    Medication Adjustments/Labs and Tests Ordered: Current medicines  are reviewed at length with the patient today.  Concerns regarding medicines are outlined above.  No orders of the defined types were placed in this encounter.  No orders of the defined types were placed in this encounter.    Signed, Lars Mage, MD, [facility], [facility] 01/20/2021 12:19 PM    Electrophysiology [facility]

## 2021-01-24 ENCOUNTER — Other Ambulatory Visit: Payer: Self-pay | Admitting: Physician Assistant

## 2021-01-25 NOTE — Progress Notes (Signed)
Remote ICD transmission.   

## 2021-02-09 ENCOUNTER — Ambulatory Visit (INDEPENDENT_AMBULATORY_CARE_PROVIDER_SITE_OTHER): Payer: Medicare Other

## 2021-02-09 ENCOUNTER — Encounter: Payer: Self-pay | Admitting: Family

## 2021-02-09 ENCOUNTER — Other Ambulatory Visit: Payer: Self-pay

## 2021-02-09 ENCOUNTER — Ambulatory Visit (INDEPENDENT_AMBULATORY_CARE_PROVIDER_SITE_OTHER): Payer: Medicare Other | Admitting: Family

## 2021-02-09 VITALS — BP 142/92 | HR 91 | Temp 98.9°F | Ht 74.5 in | Wt 178.6 lb

## 2021-02-09 DIAGNOSIS — R6889 Other general symptoms and signs: Secondary | ICD-10-CM

## 2021-02-09 DIAGNOSIS — I5022 Chronic systolic (congestive) heart failure: Secondary | ICD-10-CM | POA: Diagnosis not present

## 2021-02-09 DIAGNOSIS — R059 Cough, unspecified: Secondary | ICD-10-CM | POA: Diagnosis not present

## 2021-02-09 DIAGNOSIS — R0602 Shortness of breath: Secondary | ICD-10-CM | POA: Diagnosis not present

## 2021-02-09 DIAGNOSIS — J441 Chronic obstructive pulmonary disease with (acute) exacerbation: Secondary | ICD-10-CM

## 2021-02-09 DIAGNOSIS — J101 Influenza due to other identified influenza virus with other respiratory manifestations: Secondary | ICD-10-CM | POA: Insufficient documentation

## 2021-02-09 DIAGNOSIS — Z95810 Presence of automatic (implantable) cardiac defibrillator: Secondary | ICD-10-CM | POA: Diagnosis not present

## 2021-02-09 LAB — POC INFLUENZA A&B (BINAX/QUICKVUE)
Influenza A, POC: POSITIVE — AB
Influenza B, POC: NEGATIVE

## 2021-02-09 LAB — POC COVID19 BINAXNOW: SARS Coronavirus 2 Ag: NEGATIVE

## 2021-02-09 MED ORDER — OSELTAMIVIR PHOSPHATE 75 MG PO CAPS: 75.0000 mg | ORAL_CAPSULE | Freq: Two times a day (BID) | ORAL | 0 refills | Status: DC

## 2021-02-09 MED ORDER — PREDNISONE 10 MG PO TABS: 30.0000 mg | ORAL_TABLET | Freq: Every day | ORAL | 0 refills | Status: AC

## 2021-02-09 MED ORDER — BENZONATATE 100 MG PO CAPS: 100.0000 mg | ORAL_CAPSULE | Freq: Three times a day (TID) | ORAL | 1 refills | Status: DC | PRN

## 2021-02-09 MED ORDER — AZITHROMYCIN 250 MG PO TABS: ORAL_TABLET | ORAL | 0 refills | Status: DC

## 2021-02-09 NOTE — Assessment & Plan Note (Signed)
Acute onset 2 days ago.  Positive influenza A.  Negative point-of-care COVID.  Start Tamiflu, Best boy.  Patient will let me know how he is doing

## 2021-02-09 NOTE — Patient Instructions (Addendum)
While we await chest xray, we will go ahead and start prednisone taper.  Please start doses today prior to 2 PM as this medication can interfere with sleeping.  This medication may also increase irritability.  If you experience chest pain, palpitations with his medication please let me know right away.  Start tamiflu Most common side effects are nausea, vomiting, diarrhea.  Flu may be able to be spread to others 1 day prior to symptom onset and up to 5 to 7 days.  Please remain in quarantine until you are 24 hours without fever and WITHOUT medication such as Tylenol or ibuprofen.  You may return to work/school once you are fever free per above for 24 hours and all symptoms nearly resolved.  It is reasonable to take further precautions especially if you are around young children, or anyone that is immunocompromised, including wearing a mask, informing  those around you, and extending quarantine.   If you develop persistent or increased shortness of breath, chest pain, palpitations, worsening fever, or inability to stay hydrated, please call 911 or report to the nearest emergency room.  Influenza, Adult Influenza is also called "the flu." It is an infection in the lungs, nose, and throat (respiratory tract). It spreads easily from person to person (is contagious). The flu causes symptoms that are like a cold, along with high fever and body aches. What are the causes? This condition is caused by the influenza virus. You can get the virus by: Breathing in droplets that are in the air after a person infected with the flu coughed or sneezed. Touching something that has the virus on it and then touching your mouth, nose, or eyes. What increases the risk? Certain things may make you more likely to get the flu. These include: Not washing your hands often. Having close contact with many people during cold and flu season. Touching your mouth, eyes, or nose without first washing your hands. Not getting a  flu shot every year. You may have a higher risk for the flu, and serious problems, such as a lung infection (pneumonia), if you: Are older than 65. Are pregnant. Have a weakened disease-fighting system (immune system) because of a disease or because you are taking certain medicines. Have a long-term (chronic) condition, such as: Heart, kidney, or lung disease. Diabetes. Asthma. Have a liver disorder. Are very overweight (morbidly obese). Have anemia. What are the signs or symptoms? Symptoms usually begin suddenly and last 4-14 days. They may include: Fever and chills. Headaches, body aches, or muscle aches. Sore throat. Cough. Runny or stuffy (congested) nose. Feeling discomfort in your chest. Not wanting to eat as much as normal. Feeling weak or tired. Feeling dizzy. Feeling sick to your stomach or throwing up. How is this treated? If the flu is found early, you can be treated with antiviral medicine. This can help to reduce how bad the illness is and how long it lasts. This may be given by mouth or through an IV tube. Taking care of yourself at home can help your symptoms get better. Your doctor may want you to: Take over-the-counter medicines. Drink plenty of fluids. The flu often goes away on its own. If you have very bad symptoms or other problems, you may be treated in a hospital. Follow these instructions at home:   Activity Rest as needed. Get plenty of sleep. Stay home from work or school as told by your doctor. Do not leave home until you do not have a fever for  24 hours without taking medicine. Leave home only to go to your doctor. Eating and drinking Take an ORS (oral rehydration solution). This is a drink that is sold at pharmacies and stores. Drink enough fluid to keep your pee pale yellow. Drink clear fluids in small amounts as you are able. Clear fluids include: Water. Ice chips. Fruit juice mixed with water. Low-calorie sports drinks. Eat bland foods that  are easy to digest. Eat small amounts as you are able. These foods include: Bananas. Applesauce. Rice. Lean meats. Toast. Crackers. Do not eat or drink: Fluids that have a lot of sugar or caffeine. Alcohol. Spicy or fatty foods. General instructions Take over-the-counter and prescription medicines only as told by your doctor. Use a cool mist humidifier to add moisture to the air in your home. This can make it easier for you to breathe. When using a cool mist humidifier, clean it daily. Empty water and replace with clean water. Cover your mouth and nose when you cough or sneeze. Wash your hands with soap and water often and for at least 20 seconds. This is also important after you cough or sneeze. If you cannot use soap and water, use alcohol-based hand sanitizer. Keep all follow-up visits. How is this prevented?  Get a flu shot every year. You may get the flu shot in late summer, fall, or winter. Ask your doctor when you should get your flu shot. Avoid contact with people who are sick during fall and winter. This is cold and flu season. Contact a doctor if: You get new symptoms. You have: Chest pain. Watery poop (diarrhea). A fever. Your cough gets worse. You start to have more mucus. You feel sick to your stomach. You throw up. Get help right away if you: Have shortness of breath. Have trouble breathing. Have skin or nails that turn a bluish color. Have very bad pain or stiffness in your neck. Get a sudden headache. Get sudden pain in your face or ear. Cannot eat or drink without throwing up. These symptoms may represent a serious problem that is an emergency. Get medical help right away. Call your local emergency services (911 in the [place] Do not wait to see if the symptoms will go away. Do not drive yourself to the hospital. Summary Influenza is also called "the flu." It is an infection in the lungs, nose, and throat. It spreads easily from person to person. Take  over-the-counter and prescription medicines only as told by your doctor. Getting a flu shot every year is the best way to not get the flu. This information is not intended to replace advice given to you by your health care provider. Make sure you discuss any questions you have with your health care provider. Document Revised: 10/04/2019 Document Reviewed: 10/04/2019 Elsevier Patient Education  [place]. Oseltamivir Capsules What is this medication? OSELTAMIVIR (os el TAM i vir) prevents and treats infections caused by the flu virus (influenza). It works by slowing the spread of the flu virus in your body and reducing how long your symptoms last. It will not treat colds or infections caused by bacteria or other viruses. It will not replace the annual flu vaccine. This medicine may be used for other purposes; ask your health care provider or pharmacist if you have questions. COMMON BRAND NAME(S): Tamiflu What should I tell my care team before I take this medication? They need to know if you have any of the following conditions: Difficulty swallowing Kidney disease An unusual or  allergic reaction to oseltamivir, other medications, foods, dyes, or preservatives Pregnant or trying to get pregnant Breast-feeding How should I use this medication? Take this medication by mouth with water. Take it as directed on the prescription label at the same time every day. You can take it with or without food. If it upsets your stomach, take it with food. Take all of this medication unless your care team tells you to stop it early. Keep taking it even if you think you are better. When taking whole capsules: Do not cut, crush or chew this medication. Swallow the capsules whole. When mixing capsule contents with liquid: Open the capsule and mix the contents in a small bowl with a small amount of a sweetened liquid such as chocolate syrup, corn syrup, caramel topping, or light brown sugar (dissolved in  water). Stir the mixture and take the dose right away after mixing. Talk to your care team about the use of this medication in children. While it may be prescribed for selected conditions, precautions do apply. Overdosage: If you think you have taken too much of this medicine contact a poison control center or emergency room at once. NOTE: This medicine is only for you. Do not share this medicine with others. What if I miss a dose? If you miss a dose, take it as soon as you remember. If it is almost time for your next dose (within 2 hours), take only that dose. Do not take double or extra doses. What may interact with this medication? Intranasal influenza vaccine This list may not describe all possible interactions. Give your health care provider a list of all the medicines, herbs, non-prescription drugs, or dietary supplements you use. Also tell them if you smoke, drink alcohol, or use illegal drugs. Some items may interact with your medicine. What should I watch for while using this medication? Visit your care team for regular checks on your progress. Tell your care team if your symptoms do not start to get better or if they get worse. If you have the flu, you may be at an increased risk of developing seizures, confusion, or abnormal behavior. This occurs early in the illness, and more frequently in children and teens. These events are not common, but may result in accidental injury to the patient. Families and caregivers of patients should watch for signs of unusual behavior and contact a care team right away if the patient shows signs of unusual behavior. To treat the flu, start taking this medication within 2 days of getting flu symptoms. This medication is not a substitute for the flu shot. Talk to your care team each year about an annual flu shot. What side effects may I notice from receiving this medication? Side effects that you should report to your care team as soon as possible: Allergic  reactions--skin rash, itching, hives, swelling of the face, lips, tongue, or throat Confusion Hallucinations Redness, blistering, peeling, or loosening of the skin, including inside the mouth Seizures Tremors or shaking Trouble speaking Unusual changes in behavior Side effects that usually do not require medical attention (report to your care team if they continue or are bothersome): Headache Nausea Vomiting This list may not describe all possible side effects. Call your doctor for medical advice about side effects. You may report side effects to FDA at 1-800-FDA-1088. Where should I keep my medication? Keep out of the reach of children and pets. Store at room temperature between 15 and 30 degrees C (59 and 86 degrees F). Throw  away any unused medication after the expiration date. NOTE: This sheet is a summary. It may not cover all possible information. If you have questions about this medicine, talk to your doctor, pharmacist, or health care provider.  2022 Elsevier/Gold Standard (2020-11-03 00:00:00)

## 2021-02-09 NOTE — Progress Notes (Signed)
Last seen 01/11/2021  Subjective:    Patient ID: Jonathon Rios, male    DOB: [birth year], 70 y.o.   MRN: [id number]  CC: Jonathon Rios is a 70 y.o. male who presents today for an acute visit.    HPI: Complains of cough, bodyaches x 2 days  Increased sputum, SOB with activity No cp, facial pain, ear pain, HA, leg swelling.     H/o COPD -he is compliant with Symbicort, albuterol Current smoker  Follow up with Dr Jonathon Rios 01/20/21 S/p ICD implanted 09/2020 History of chronic systolic heart failure continues to follow closely with Jonathon Rios.  Last seen 01/11/21 He is compliant with Lasix prn, Farxiga, Coreg and Rule.  No sob, cp, leg swelling. Weight is down today.   Eosinophils relative 1% No recent COPD exacerbation, antibiotics in the last 3 months.  No known history of Pseudomonas  HISTORY:  Past Medical History:  Diagnosis Date   Alcoholism and drug addiction in family    Arthritis    CHF (congestive heart failure) (HCC)    Coronary artery disease    Heart disease    Hypertension    Irregular heart beat    Positive TB test    Seizure Jonathon Rios)    witnessed by family in restaurant   Past Surgical History:  Procedure Laterality Date   CARDIAC CATHETERIZATION     RIGHT/LEFT HEART CATH AND CORONARY ANGIOGRAPHY N/A 07/30/2020   Procedure: RIGHT/LEFT HEART CATH AND CORONARY ANGIOGRAPHY poss PCI;  Surgeon: Jonathon Rios, Jonathon;  Location: [facility];  Service: Cardiovascular;  Laterality: N/A;   SUBQ ICD IMPLANT N/A 10/15/2020   Procedure: SUBQ ICD IMPLANT;  Surgeon: Jonathon Rios, Jonathon;  Location: [facility];  Service: Cardiovascular;  Laterality: N/A;   TONSILLECTOMY     Family History  Problem Relation Age of Onset   Hypertension Mother    Diabetes Mother    Alcohol abuse Mother    Hyperlipidemia Mother    Hypertension Sister    Alcohol abuse Sister    Asthma Sister    Depression Sister    Drug abuse Sister    Alcohol abuse Father     Hypertension Father    Hypertension Daughter    Early death Daughter    Drug abuse Daughter    Depression Daughter    Alcohol abuse Daughter     Allergies: Penicillins Current Outpatient Medications on File Prior to Visit  Medication Sig Dispense Refill   acetaminophen (TYLENOL) 325 MG tablet Take 2 tablets (650 mg total) by mouth every 6 (six) hours as needed for fever.     albuterol (PROAIR HFA) 108 (90 Base) MCG/ACT inhaler Inhale 2 puffs into the lungs every 6 (six) hours as needed for wheezing or shortness of breath. 1 each 0   aspirin EC 81 MG tablet Take 1 tablet (81 mg total) by mouth daily.     budesonide-formoterol (SYMBICORT) 160-4.5 MCG/ACT inhaler TAKE 2 PUFFS BY MOUTH TWICE A DAY 1 each 0   carvedilol (COREG) 12.5 MG tablet Take 2 tablets (25 mg total) by mouth 2 (two) times daily. 60 tablet 3   dapagliflozin propanediol (FARXIGA) 10 MG TABS tablet Take 1 tablet (10 mg total) by mouth daily before breakfast. 30 tablet 5   ENTRESTO 97-103 MG TAKE 1 TABLET BY MOUTH TWICE A DAY 60 tablet 6   feeding supplement (ENSURE ENLIVE / ENSURE PLUS) LIQD Take 237 mLs by mouth 3 (three) times daily between meals.  furosemide (LASIX) 40 MG tablet Take 1 tablet (40 mg total) by mouth daily as needed (As needed for weight gain 3 lbs in 1 day or 5 lbs over dry weight). 30 tablet 5   Multiple Vitamin (MULTIVITAMIN WITH MINERALS) TABS tablet Take 1 tablet by mouth daily.     rosuvastatin (CRESTOR) 10 MG tablet TAKE 1 TABLET BY MOUTH EVERY DAY 90 tablet 3   No current facility-administered medications on file prior to visit.    Social History   Tobacco Use   Smoking status: Some Days    Packs/day: 0.50    Years: 50.00    Pack years: 25.00    Types: Cigarettes    Last attempt to quit: 02/23/2017    Years since quitting: 3.9   Smokeless tobacco: Never  Vaping Use   Vaping Use: Never used  Substance Use Topics   Alcohol use: No   Drug use: Not Currently    Types: "Crack" cocaine,  Heroin, Marijuana, LSD    Comment: Sober since 2018.     Review of Systems  Constitutional:  Positive for chills. Negative for fever.  HENT:  Positive for congestion. Negative for sneezing and sore throat.   Respiratory:  Positive for cough and shortness of breath.   Cardiovascular:  Negative for chest pain, palpitations and leg swelling.  Gastrointestinal:  Negative for nausea and vomiting.  Musculoskeletal:  Positive for myalgias.     Objective:    BP (!) 142/92    Pulse 91    Temp 98.9 F (37.2 C) (Oral)    Ht 6' 2.5" (1.892 m)    Wt 178 lb 9.6 oz (81 kg)    SpO2 96%    BMI 22.62 kg/m  Wt Readings from Last 3 Encounters:  02/09/21 178 lb 9.6 oz (81 kg)  01/20/21 186 lb (84.4 kg)  01/11/21 185 lb (83.9 kg)     Physical Exam Vitals reviewed.  Constitutional:      Appearance: He is well-developed.  HENT:     Head: Normocephalic and atraumatic.     Right Ear: Hearing, tympanic membrane, ear canal and external ear normal. No decreased hearing noted. No drainage, swelling or tenderness. No middle ear effusion. Tympanic membrane is not injected, erythematous or bulging.     Left Ear: Hearing, tympanic membrane, ear canal and external ear normal. No decreased hearing noted. No drainage, swelling or tenderness.  No middle ear effusion. Tympanic membrane is not injected, erythematous or bulging.     Nose: Nose normal.     Right Sinus: No maxillary sinus tenderness or frontal sinus tenderness.     Left Sinus: No maxillary sinus tenderness or frontal sinus tenderness.     Mouth/Throat:     Pharynx: Uvula midline. No oropharyngeal exudate or posterior oropharyngeal erythema.     Tonsils: No tonsillar abscesses.  Eyes:     Conjunctiva/sclera: Conjunctivae normal.  Cardiovascular:     Rate and Rhythm: Regular rhythm.     Heart sounds: Normal heart sounds.  Pulmonary:     Effort: Pulmonary effort is normal. No respiratory distress.     Breath sounds: Examination of the right-middle  field reveals rhonchi. Examination of the left-middle field reveals rhonchi. Examination of the right-lower field reveals rhonchi. Examination of the left-lower field reveals rhonchi. Rhonchi present. No wheezing or rales.  Musculoskeletal:     Right lower leg: No edema.     Left lower leg: No edema.  Lymphadenopathy:     Head:  Right side of head: No submental, submandibular, tonsillar, preauricular, posterior auricular or occipital adenopathy.     Left side of head: No submental, submandibular, tonsillar, preauricular, posterior auricular or occipital adenopathy.     Cervical: No cervical adenopathy.  Skin:    General: Skin is warm and dry.  Neurological:     Mental Status: He is alert.  Psychiatric:        Speech: Speech normal.        Behavior: Behavior normal.       Assessment & Plan:   Problem List Items Addressed This Visit       Cardiovascular and Mediastinum   Chronic HFrEF (heart failure with reduced ejection fraction) (HCC)    No evidence of fluid volume overload on exam.  Increase shortness of breath.  Pending chest x-ray. Continue Lasix prn, Farxiga, Coreg and Entresto as managed by cardiology, Jonathon Rios        Respiratory   COPD exacerbation (Jonathon Rios)    Walking SaO2 stated 96%.  Patient was labored in breathing after 2-3 minute walk down our long hallway. During our walk, we talked and he was not labored in his speech with mask on.When we returned to exam room, he removed his mask and respirations slowed and shortness of breath improved.   During this time when we returned to the exam room, I monitored his Sa02 which remained 96%, heart rate 86 to 88 bpm.  Concerned that in the setting of positive influenza, he is also having a COPD exacerbation.  Started him on a prednisone taper.  He is currently on Symbicort and follow-up will likely discuss change to LAMA, Spiriva based on GOLD 2023 Guidelines as well as arrange consult with pulmonology. Pending cxr and strongly  emphasized the importance of him staying vigilant of shortness of breath or to worsen in any way or he develop chest pain, he understands to call 911 or go to the nearest emergency room close follow-up      Relevant Medications   predniSONE (DELTASONE) 10 MG tablet   benzonatate (TESSALON) 100 MG capsule   Influenza A    Acute onset 2 days ago.  Positive influenza A.  Negative point-of-care COVID.  Start Tamiflu, Best boy.  Patient will let me know how he is doing      Relevant Medications   oseltamivir (TAMIFLU) 75 MG capsule   Other Visit Diagnoses     Flu-like symptoms    -  Primary   Relevant Medications   predniSONE (DELTASONE) 10 MG tablet   benzonatate (TESSALON) 100 MG capsule   oseltamivir (TAMIFLU) 75 MG capsule   Other Relevant Orders   POC COVID-19 (Completed)   POC Influenza A&B (Binax test) (Completed)   DG Chest 2 View (Completed)         I have discontinued Jonathon Rios's azithromycin. I am also having him start on predniSONE, benzonatate, and oseltamivir. Additionally, I am having him maintain his aspirin EC, albuterol, budesonide-formoterol, feeding supplement, multivitamin with minerals, acetaminophen, carvedilol, furosemide, dapagliflozin propanediol, rosuvastatin, and Entresto.   Meds ordered this encounter  Medications   predniSONE (DELTASONE) 10 MG tablet    Sig: Take 3 tablets (30 mg total) by mouth daily with breakfast for 5 days.    Dispense:  15 tablet    Refill:  0    Order Specific Question:   Supervising Provider    Answer:   Crecencio Mc [[id number]]   DISCONTD: azithromycin (ZITHROMAX) 250 MG tablet  Sig: Take 2 tablets on day 1, then 1 tablet daily on days 2 through 5    Dispense:  6 tablet    Refill:  0    Order Specific Question:   Supervising Provider    Answer:   Crecencio Mc [[id number]]   benzonatate (TESSALON) 100 MG capsule    Sig: Take 1 capsule (100 mg total) by mouth 3 (three) times daily as needed for cough.     Dispense:  20 capsule    Refill:  1    Order Specific Question:   Supervising Provider    Answer:   Crecencio Mc [[id number]]   oseltamivir (TAMIFLU) 75 MG capsule    Sig: Take 1 capsule (75 mg total) by mouth 2 (two) times daily.    Dispense:  10 capsule    Refill:  0    Order Specific Question:   Supervising Provider    Answer:   Crecencio Mc [[id number]]     Return precautions given.   Risks, benefits, and alternatives of the medications and treatment plan prescribed today were discussed, and patient expressed understanding.   Education regarding symptom management and diagnosis given to patient on AVS.  Continue to follow with [name], FNP for routine health maintenance.   [name] and I agreed with plan.   Mable Paris, FNP

## 2021-02-09 NOTE — Assessment & Plan Note (Addendum)
No evidence of fluid volume overload on exam.  Increase shortness of breath.   Continue Lasix prn, Farxiga, Coreg and Entresto as managed by cardiology, [name]

## 2021-02-09 NOTE — Assessment & Plan Note (Addendum)
Walking SaO2 stated 96%.  Patient was labored in breathing after 2-3 minute walk down our long hallway. During our walk, we talked and he was not labored in his speech with mask on.When we returned to exam room, he removed his mask and respirations slowed and shortness of breath improved.   During this time when we returned to the exam room, I monitored his Sa02 which remained 96%, heart rate 86 to 88 bpm.  Concerned that in the setting of positive influenza, he is also having a COPD exacerbation.  Started him on a prednisone taper.  He is currently on Symbicort and follow-up will likely discuss change to LAMA, Spiriva based on GOLD 2023 Guidelines as well as arrange consult with pulmonology. CXR shows acute cardiopulmonary disease. . I  strongly emphasized the importance of him staying vigilant of shortness of breath or to worsen in any way or he develop chest pain, he understands to call 911 or go to the nearest emergency room close follow-up

## 2021-03-03 ENCOUNTER — Ambulatory Visit: Payer: Medicare Other | Admitting: Family

## 2021-03-19 DIAGNOSIS — M545 Low back pain, unspecified: Secondary | ICD-10-CM | POA: Diagnosis not present

## 2021-03-19 DIAGNOSIS — M9903 Segmental and somatic dysfunction of lumbar region: Secondary | ICD-10-CM | POA: Diagnosis not present

## 2021-03-19 DIAGNOSIS — M5417 Radiculopathy, lumbosacral region: Secondary | ICD-10-CM | POA: Diagnosis not present

## 2021-03-19 DIAGNOSIS — M9905 Segmental and somatic dysfunction of pelvic region: Secondary | ICD-10-CM | POA: Diagnosis not present

## 2021-03-22 DIAGNOSIS — M9903 Segmental and somatic dysfunction of lumbar region: Secondary | ICD-10-CM | POA: Diagnosis not present

## 2021-03-22 DIAGNOSIS — M545 Low back pain, unspecified: Secondary | ICD-10-CM | POA: Diagnosis not present

## 2021-03-22 DIAGNOSIS — M9905 Segmental and somatic dysfunction of pelvic region: Secondary | ICD-10-CM | POA: Diagnosis not present

## 2021-03-22 DIAGNOSIS — M5417 Radiculopathy, lumbosacral region: Secondary | ICD-10-CM | POA: Diagnosis not present

## 2021-03-24 DIAGNOSIS — M9903 Segmental and somatic dysfunction of lumbar region: Secondary | ICD-10-CM | POA: Diagnosis not present

## 2021-03-24 DIAGNOSIS — M5417 Radiculopathy, lumbosacral region: Secondary | ICD-10-CM | POA: Diagnosis not present

## 2021-03-24 DIAGNOSIS — M9905 Segmental and somatic dysfunction of pelvic region: Secondary | ICD-10-CM | POA: Diagnosis not present

## 2021-03-24 DIAGNOSIS — M545 Low back pain, unspecified: Secondary | ICD-10-CM | POA: Diagnosis not present

## 2021-03-26 DIAGNOSIS — M545 Low back pain, unspecified: Secondary | ICD-10-CM | POA: Diagnosis not present

## 2021-03-26 DIAGNOSIS — M9903 Segmental and somatic dysfunction of lumbar region: Secondary | ICD-10-CM | POA: Diagnosis not present

## 2021-03-26 DIAGNOSIS — M9905 Segmental and somatic dysfunction of pelvic region: Secondary | ICD-10-CM | POA: Diagnosis not present

## 2021-03-26 DIAGNOSIS — M5417 Radiculopathy, lumbosacral region: Secondary | ICD-10-CM | POA: Diagnosis not present

## 2021-03-29 DIAGNOSIS — M9903 Segmental and somatic dysfunction of lumbar region: Secondary | ICD-10-CM | POA: Diagnosis not present

## 2021-03-29 DIAGNOSIS — M545 Low back pain, unspecified: Secondary | ICD-10-CM | POA: Diagnosis not present

## 2021-03-29 DIAGNOSIS — M5417 Radiculopathy, lumbosacral region: Secondary | ICD-10-CM | POA: Diagnosis not present

## 2021-03-29 DIAGNOSIS — M9905 Segmental and somatic dysfunction of pelvic region: Secondary | ICD-10-CM | POA: Diagnosis not present

## 2021-03-31 DIAGNOSIS — M5417 Radiculopathy, lumbosacral region: Secondary | ICD-10-CM | POA: Diagnosis not present

## 2021-03-31 DIAGNOSIS — M9903 Segmental and somatic dysfunction of lumbar region: Secondary | ICD-10-CM | POA: Diagnosis not present

## 2021-03-31 DIAGNOSIS — M9905 Segmental and somatic dysfunction of pelvic region: Secondary | ICD-10-CM | POA: Diagnosis not present

## 2021-03-31 DIAGNOSIS — M545 Low back pain, unspecified: Secondary | ICD-10-CM | POA: Diagnosis not present

## 2021-04-02 DIAGNOSIS — M9903 Segmental and somatic dysfunction of lumbar region: Secondary | ICD-10-CM | POA: Diagnosis not present

## 2021-04-02 DIAGNOSIS — M5417 Radiculopathy, lumbosacral region: Secondary | ICD-10-CM | POA: Diagnosis not present

## 2021-04-02 DIAGNOSIS — M9905 Segmental and somatic dysfunction of pelvic region: Secondary | ICD-10-CM | POA: Diagnosis not present

## 2021-04-02 DIAGNOSIS — M545 Low back pain, unspecified: Secondary | ICD-10-CM | POA: Diagnosis not present

## 2021-04-05 DIAGNOSIS — M545 Low back pain, unspecified: Secondary | ICD-10-CM | POA: Diagnosis not present

## 2021-04-05 DIAGNOSIS — M9903 Segmental and somatic dysfunction of lumbar region: Secondary | ICD-10-CM | POA: Diagnosis not present

## 2021-04-05 DIAGNOSIS — M5417 Radiculopathy, lumbosacral region: Secondary | ICD-10-CM | POA: Diagnosis not present

## 2021-04-05 DIAGNOSIS — M9905 Segmental and somatic dysfunction of pelvic region: Secondary | ICD-10-CM | POA: Diagnosis not present

## 2021-04-06 ENCOUNTER — Other Ambulatory Visit: Payer: Self-pay

## 2021-04-06 ENCOUNTER — Encounter: Payer: Self-pay | Admitting: Family

## 2021-04-06 ENCOUNTER — Ambulatory Visit (INDEPENDENT_AMBULATORY_CARE_PROVIDER_SITE_OTHER): Payer: Medicare Other | Admitting: Family

## 2021-04-06 VITALS — BP 142/88 | HR 72 | Temp 98.2°F | Ht 74.5 in | Wt 185.6 lb

## 2021-04-06 DIAGNOSIS — I5022 Chronic systolic (congestive) heart failure: Secondary | ICD-10-CM | POA: Diagnosis not present

## 2021-04-06 DIAGNOSIS — N183 Chronic kidney disease, stage 3 unspecified: Secondary | ICD-10-CM

## 2021-04-06 DIAGNOSIS — I10 Essential (primary) hypertension: Secondary | ICD-10-CM

## 2021-04-06 DIAGNOSIS — R3911 Hesitancy of micturition: Secondary | ICD-10-CM

## 2021-04-06 DIAGNOSIS — J449 Chronic obstructive pulmonary disease, unspecified: Secondary | ICD-10-CM

## 2021-04-06 LAB — COMPREHENSIVE METABOLIC PANEL
ALT: 6 U/L (ref 0–53)
AST: 12 U/L (ref 0–37)
Albumin: 4.2 g/dL (ref 3.5–5.2)
Alkaline Phosphatase: 50 U/L (ref 39–117)
BUN: 20 mg/dL (ref 6–23)
CO2: 33 mEq/L — ABNORMAL HIGH (ref 19–32)
Calcium: 9.4 mg/dL (ref 8.4–10.5)
Chloride: 105 mEq/L (ref 96–112)
Creatinine, Ser: 1.62 mg/dL — ABNORMAL HIGH (ref 0.40–1.50)
GFR: 43.36 mL/min — ABNORMAL LOW (ref >60.00)
Glucose, Bld: 80 mg/dL (ref 70–99)
Potassium: 5 mEq/L (ref 3.5–5.1)
Sodium: 141 mEq/L (ref 135–145)
Total Bilirubin: 0.8 mg/dL (ref 0.2–1.2)
Total Protein: 7.4 g/dL (ref 6.0–8.3)

## 2021-04-06 LAB — PSA: PSA: 2.46 ng/mL (ref 0.10–4.00)

## 2021-04-06 MED ORDER — BUDESONIDE-FORMOTEROL FUMARATE 160-4.5 MCG/ACT IN AERO
INHALATION_SPRAY | RESPIRATORY_TRACT | 0 refills | Status: DC
Start: 2021-04-06 — End: 2021-05-05

## 2021-04-06 MED ORDER — ALBUTEROL SULFATE HFA 108 (90 BASE) MCG/ACT IN AERS
2.0000 | INHALATION_SPRAY | Freq: Four times a day (QID) | RESPIRATORY_TRACT | 0 refills | Status: DC | PRN
Start: 2021-04-06 — End: 2021-04-30

## 2021-04-06 NOTE — Assessment & Plan Note (Addendum)
Chronic, symptomatically stable.  Blood pressure slightly elevated in the office today although improved as patient rested in exam room.  He continues to follow with Dr. Quentin Ore, [name].  Blood pressure better controlled at home. Continue carvedilol 25 mg twice daily, Entresto 97/23 mg twice daily, Farxiga 10 mg, and furosemide 40 mg as needed. BP at home 136/80 and advised him to continue monitoring as BP at home appears at goal. We will also make patient a follow up with [name] for on going blood pressure management.

## 2021-04-06 NOTE — Patient Instructions (Signed)
As discussed, blood pressure slightly elevated today.  Please call with blood pressure readings from home as we can make further adjustments if necessary.  Goal less than 135/80  Referral placed to both nephrology and urology Let us know if you dont hear back within a week in regards to an appointment being scheduled.   Nice to see you,always

## 2021-04-06 NOTE — Assessment & Plan Note (Signed)
Patient agreeable to nephrology referral.  Referral placed

## 2021-04-06 NOTE — Assessment & Plan Note (Signed)
Concern for prostate etiology.  Pending PSA, urinalysis and referral to urology

## 2021-04-06 NOTE — Progress Notes (Signed)
Subjective:    Patient ID: Jonathon Rios, male    DOB: May 07, 1950, 71 y.o.   MRN: [id number]  CC: Jonathon Rios is a 71 y.o. male who presents today for follow up.   HPI: He is staying active working in house and yard.   No sob, cp, palpitations.   He denies depression . He has a strong faith.   Complains more recently of decreased urinary stream.  No dysuria, hematuria    Follow-up for chronic heart failure reduced ejection fraction, Dr. Quentin Ore 01/20/2021 History of cardiac arrest/VF . He has an ICD.   HFrEF-compliant with carvedilol 25 mg twice daily, Entresto 97/23 mg twice daily, Farxiga 10 mg, and furosemide 40 mg as needed. BP at home 136/80.  HISTORY:  Past Medical History:  Diagnosis Date   Alcoholism and drug addiction in family    Arthritis    CHF (congestive heart failure) ([place])    Coronary artery disease    Heart disease    Hypertension    Irregular heart beat    Positive TB test    Seizure [facility])    witnessed by family in restaurant   Past Surgical History:  Procedure Laterality Date   CARDIAC CATHETERIZATION     RIGHT/LEFT HEART CATH AND CORONARY ANGIOGRAPHY N/A 07/30/2020   Procedure: RIGHT/LEFT HEART CATH AND CORONARY ANGIOGRAPHY poss PCI;  Surgeon: Wellington Hampshire, MD;  Location: [place] [facility];  Service: Cardiovascular;  Laterality: N/A;   SUBQ ICD IMPLANT N/A 10/15/2020   Procedure: SUBQ ICD IMPLANT;  Surgeon: Vickie Epley, MD;  Location: [facility];  Service: Cardiovascular;  Laterality: N/A;   TONSILLECTOMY     Family History  Problem Relation Age of Onset   Hypertension Mother    Diabetes Mother    Alcohol abuse Mother    Hyperlipidemia Mother    Hypertension Sister    Alcohol abuse Sister    Asthma Sister    Depression Sister    Drug abuse Sister    Alcohol abuse Father    Hypertension Father    Hypertension Daughter    Early death Daughter    Drug abuse Daughter    Depression Daughter    Alcohol abuse  Daughter     Allergies: Penicillins Current Outpatient Medications on File Prior to Visit  Medication Sig Dispense Refill   acetaminophen (TYLENOL) 325 MG tablet Take 2 tablets (650 mg total) by mouth every 6 (six) hours as needed for fever.     aspirin EC 81 MG tablet Take 1 tablet (81 mg total) by mouth daily.     dapagliflozin propanediol (FARXIGA) 10 MG TABS tablet Take 1 tablet (10 mg total) by mouth daily before breakfast. 30 tablet 5   ENTRESTO 97-103 MG TAKE 1 TABLET BY MOUTH TWICE A DAY 60 tablet 6   feeding supplement (ENSURE ENLIVE / ENSURE PLUS) LIQD Take 237 mLs by mouth 3 (three) times daily between meals.     Multiple Vitamin (MULTIVITAMIN WITH MINERALS) TABS tablet Take 1 tablet by mouth daily.     rosuvastatin (CRESTOR) 10 MG tablet TAKE 1 TABLET BY MOUTH EVERY DAY 90 tablet 3   carvedilol (COREG) 12.5 MG tablet Take 2 tablets (25 mg total) by mouth 2 (two) times daily. 60 tablet 3   furosemide (LASIX) 40 MG tablet Take 1 tablet (40 mg total) by mouth daily as needed (As needed for weight gain 3 lbs in 1 day or 5 lbs over dry weight).  30 tablet 5   No current facility-administered medications on file prior to visit.    Social History   Tobacco Use   Smoking status: Some Days    Packs/day: 0.50    Years: 50.00    Pack years: 25.00    Types: Cigarettes    Last attempt to quit: 02/23/2017    Years since quitting: 4.1   Smokeless tobacco: Never  Vaping Use   Vaping Use: Never used  Substance Use Topics   Alcohol use: No   Drug use: Not Currently    Types: "Crack" cocaine, Heroin, Marijuana, LSD    Comment: Sober since 2018.     Review of Systems  Constitutional:  Negative for chills and fever.  Respiratory:  Negative for cough.   Cardiovascular:  Negative for chest pain and palpitations.  Gastrointestinal:  Negative for nausea and vomiting.  Genitourinary:  Negative for difficulty urinating, dysuria and frequency.     Objective:    BP (!) 142/88    Pulse  72    Temp 98.2 F (36.8 C) (Oral)    Ht 6' 2.5" (1.892 m)    Wt 185 lb 9.6 oz (84.2 kg)    SpO2 98%    BMI 23.51 kg/m  BP Readings from Last 3 Encounters:  04/06/21 (!) 142/88  02/09/21 (!) 142/92  01/20/21 126/74   Wt Readings from Last 3 Encounters:  04/06/21 185 lb 9.6 oz (84.2 kg)  02/09/21 178 lb 9.6 oz (81 kg)  01/20/21 186 lb (84.4 kg)    Physical Exam Vitals reviewed.  Constitutional:      Appearance: He is well-developed.  Cardiovascular:     Rate and Rhythm: Regular rhythm.     Heart sounds: Normal heart sounds.  Pulmonary:     Effort: Pulmonary effort is normal. No respiratory distress.     Breath sounds: Normal breath sounds. No wheezing, rhonchi or rales.  Skin:    General: Skin is warm and dry.  Neurological:     Mental Status: He is alert.  Psychiatric:        Speech: Speech normal.        Behavior: Behavior normal.       Assessment & Plan:   Problem List Items Addressed This Visit       Cardiovascular and Mediastinum   HTN (hypertension) - Primary (Chronic)   Relevant Orders   Comprehensive metabolic panel   Chronic HFrEF (heart failure with reduced ejection fraction) (HCC)    Chronic, symptomatically stable.  Blood pressure slightly elevated in the office today although improved as patient rested in exam room.  He continues to follow with Dr. Quentin Ore, [name].  Blood pressure better controlled at home. Continue carvedilol 25 mg twice daily, Entresto 97/23 mg twice daily, Farxiga 10 mg, and furosemide 40 mg as needed. BP at home 136/80 and advised him to continue monitoring as BP at home appears at goal. We will also make patient a follow up with [name] for on going blood pressure management.          Genitourinary   CKD (chronic kidney disease) (Chronic)    Patient agreeable to nephrology referral.  Referral placed      Relevant Orders   Ambulatory referral to Nephrology     Other   Urinary hesitancy    Concern for prostate etiology.   Pending PSA, urinalysis and referral to urology      Relevant Orders   PSA   Urinalysis, Routine w  reflex microscopic   Urine Culture   Ambulatory referral to Urology     I have discontinued Darreon A. Carns's benzonatate and oseltamivir. I am also having him maintain his aspirin EC, feeding supplement, multivitamin with minerals, acetaminophen, carvedilol, furosemide, dapagliflozin propanediol, rosuvastatin, and Entresto.   No orders of the defined types were placed in this encounter.   Return precautions given.   Risks, benefits, and alternatives of the medications and treatment plan prescribed today were discussed, and patient expressed understanding.   Education regarding symptom management and diagnosis given to patient on AVS.  Continue to follow with [name], FNP for routine health maintenance.   [name] and I agreed with plan.   Mable Paris, FNP

## 2021-04-07 DIAGNOSIS — M9903 Segmental and somatic dysfunction of lumbar region: Secondary | ICD-10-CM | POA: Diagnosis not present

## 2021-04-07 DIAGNOSIS — M5417 Radiculopathy, lumbosacral region: Secondary | ICD-10-CM | POA: Diagnosis not present

## 2021-04-07 DIAGNOSIS — M9905 Segmental and somatic dysfunction of pelvic region: Secondary | ICD-10-CM | POA: Diagnosis not present

## 2021-04-07 DIAGNOSIS — M545 Low back pain, unspecified: Secondary | ICD-10-CM | POA: Diagnosis not present

## 2021-04-07 LAB — URINE CULTURE
MICRO NUMBER:: 12974752
SPECIMEN QUALITY:: ADEQUATE

## 2021-04-07 NOTE — Addendum Note (Signed)
Addended by: Leeanne Rio on: 04/07/2021 03:07 PM   Modules accepted: Orders

## 2021-04-08 LAB — URINALYSIS, ROUTINE W REFLEX MICROSCOPIC
Bilirubin, UA: NEGATIVE
Ketones, UA: NEGATIVE
Leukocytes,UA: NEGATIVE
Nitrite, UA: NEGATIVE
Specific Gravity, UA: 1.023 (ref 1.005–1.030)
Urobilinogen, Ur: 0.2 mg/dL (ref 0.2–1.0)
pH, UA: 5.5 (ref 5.0–7.5)

## 2021-04-08 LAB — MICROSCOPIC EXAMINATION
Bacteria, UA: NONE SEEN
Casts: NONE SEEN /lpf
Epithelial Cells (non renal): NONE SEEN /hpf (ref 0–10)
WBC, UA: NONE SEEN /hpf (ref 0–5)

## 2021-04-09 ENCOUNTER — Encounter: Payer: Self-pay | Admitting: *Deleted

## 2021-04-09 LAB — URINE CULTURE

## 2021-04-14 DIAGNOSIS — M5417 Radiculopathy, lumbosacral region: Secondary | ICD-10-CM | POA: Diagnosis not present

## 2021-04-14 DIAGNOSIS — M545 Low back pain, unspecified: Secondary | ICD-10-CM | POA: Diagnosis not present

## 2021-04-14 DIAGNOSIS — M9905 Segmental and somatic dysfunction of pelvic region: Secondary | ICD-10-CM | POA: Diagnosis not present

## 2021-04-14 DIAGNOSIS — M9903 Segmental and somatic dysfunction of lumbar region: Secondary | ICD-10-CM | POA: Diagnosis not present

## 2021-04-16 ENCOUNTER — Ambulatory Visit (INDEPENDENT_AMBULATORY_CARE_PROVIDER_SITE_OTHER): Payer: Medicare Other

## 2021-04-16 ENCOUNTER — Encounter: Payer: Self-pay | Admitting: Urology

## 2021-04-16 ENCOUNTER — Other Ambulatory Visit: Payer: Self-pay | Admitting: Urology

## 2021-04-16 ENCOUNTER — Ambulatory Visit (INDEPENDENT_AMBULATORY_CARE_PROVIDER_SITE_OTHER): Payer: Medicare Other | Admitting: Urology

## 2021-04-16 ENCOUNTER — Other Ambulatory Visit: Payer: Self-pay

## 2021-04-16 VITALS — BP 162/104 | HR 76 | Ht 74.0 in | Wt 187.0 lb

## 2021-04-16 DIAGNOSIS — N401 Enlarged prostate with lower urinary tract symptoms: Secondary | ICD-10-CM

## 2021-04-16 DIAGNOSIS — R3911 Hesitancy of micturition: Secondary | ICD-10-CM

## 2021-04-16 DIAGNOSIS — I428 Other cardiomyopathies: Secondary | ICD-10-CM

## 2021-04-16 DIAGNOSIS — M545 Low back pain, unspecified: Secondary | ICD-10-CM | POA: Diagnosis not present

## 2021-04-16 DIAGNOSIS — N5201 Erectile dysfunction due to arterial insufficiency: Secondary | ICD-10-CM | POA: Diagnosis not present

## 2021-04-16 DIAGNOSIS — M9905 Segmental and somatic dysfunction of pelvic region: Secondary | ICD-10-CM | POA: Diagnosis not present

## 2021-04-16 DIAGNOSIS — M9903 Segmental and somatic dysfunction of lumbar region: Secondary | ICD-10-CM | POA: Diagnosis not present

## 2021-04-16 DIAGNOSIS — M5417 Radiculopathy, lumbosacral region: Secondary | ICD-10-CM | POA: Diagnosis not present

## 2021-04-16 LAB — URINALYSIS, COMPLETE
Bilirubin, UA: NEGATIVE
Ketones, UA: NEGATIVE
Leukocytes,UA: NEGATIVE
Nitrite, UA: NEGATIVE
Specific Gravity, UA: 1.025 (ref 1.005–1.030)
Urobilinogen, Ur: 0.2 mg/dL (ref 0.2–1.0)
pH, UA: 5 (ref 5.0–7.5)

## 2021-04-16 LAB — MICROSCOPIC EXAMINATION

## 2021-04-16 LAB — BLADDER SCAN AMB NON-IMAGING

## 2021-04-16 MED ORDER — TAMSULOSIN HCL 0.4 MG PO CAPS: 0.4000 mg | ORAL_CAPSULE | Freq: Every day | ORAL | 11 refills | Status: DC

## 2021-04-16 MED ORDER — SILDENAFIL CITRATE 100 MG PO TABS: ORAL_TABLET | ORAL | 1 refills | Status: DC

## 2021-04-16 NOTE — Progress Notes (Signed)
04/16/2021 11:28 AM   Jonathon Rios 16-Apr-1950 [id number]  Referring provider: [name], FNP [address] [place],  [place] [address]  Chief Complaint  Patient presents with   Other    HPI: Jonathon Rios is a 71 y.o. male referred for evaluation of lower urinary tract symptoms.  Saw Mable Paris 04/06/2021 with complaints of urinary hesitancy Most bothersome symptoms urinary hesitancy, weak stream and frequency times several years.  No prior treatment PSA was 2.46; UA unremarkable and urine culture negative Had V-fib arrest August 2022 and states had blood in urine after catheter placement.  No prior gross hematuria  Also complains of difficulty achieving and maintaining an erection Organic risk factors include heart disease, hypertension, antihypertensive medication and previous tobacco history States he has used a friend's sildenafil in the past which was effective Presently denies use of oral or sublingual nitrates   PMH: Past Medical History:  Diagnosis Date   Alcoholism and drug addiction in family    Arthritis    CHF (congestive heart failure) (HCC)    Coronary artery disease    Heart disease    Hypertension    Irregular heart beat    Positive TB test    Seizure ([place])    witnessed by family in restaurant    Surgical History: Past Surgical History:  Procedure Laterality Date   CARDIAC CATHETERIZATION     RIGHT/LEFT HEART CATH AND CORONARY ANGIOGRAPHY N/A 07/30/2020   Procedure: RIGHT/LEFT HEART CATH AND CORONARY ANGIOGRAPHY poss PCI;  Surgeon: Wellington Hampshire, MD;  Location: [facility];  Service: Cardiovascular;  Laterality: N/A;   SUBQ ICD IMPLANT N/A 10/15/2020   Procedure: SUBQ ICD IMPLANT;  Surgeon: Vickie Epley, MD;  Location: [facility];  Service: Cardiovascular;  Laterality: N/A;   TONSILLECTOMY      Home Medications:  Allergies as of 04/16/2021       Reactions   Penicillins Hives   Has patient  had a PCN reaction causing immediate rash, facial/tongue/throat swelling, SOB or lightheadedness with hypotension: Yes Has patient had a PCN reaction causing severe rash involving mucus membranes or skin necrosis: No Has patient had a PCN reaction that required hospitalization: No Has patient had a PCN reaction occurring within the last 10 years: No If all of the above answers are "NO", then may proceed with Cephalosporin use.        Medication List        Accurate as of April 16, 2021 11:28 AM. If you have any questions, ask your nurse or doctor.          acetaminophen 325 MG tablet Commonly known as: TYLENOL Take 2 tablets (650 mg total) by mouth every 6 (six) hours as needed for fever.   albuterol 108 (90 Base) MCG/ACT inhaler Commonly known as: ProAir HFA Inhale 2 puffs into the lungs every 6 (six) hours as needed for wheezing or shortness of breath.   aspirin EC 81 MG tablet Take 1 tablet (81 mg total) by mouth daily.   budesonide-formoterol 160-4.5 MCG/ACT inhaler Commonly known as: Symbicort TAKE 2 PUFFS BY MOUTH TWICE A DAY   carvedilol 12.5 MG tablet Commonly known as: COREG Take 2 tablets (25 mg total) by mouth 2 (two) times daily.   dapagliflozin propanediol 10 MG Tabs tablet Commonly known as: Farxiga Take 1 tablet (10 mg total) by mouth daily before breakfast.   Entresto 97-103 MG Generic drug: sacubitril-valsartan TAKE 1 TABLET BY  MOUTH TWICE A DAY   feeding supplement Liqd Take 237 mLs by mouth 3 (three) times daily between meals.   furosemide 40 MG tablet Commonly known as: LASIX Take 1 tablet (40 mg total) by mouth daily as needed (As needed for weight gain 3 lbs in 1 day or 5 lbs over dry weight).   multivitamin with minerals Tabs tablet Take 1 tablet by mouth daily.   rosuvastatin 10 MG tablet Commonly known as: CRESTOR TAKE 1 TABLET BY MOUTH EVERY DAY        Allergies:  Allergies  Allergen Reactions   Penicillins Hives    Has  patient had a PCN reaction causing immediate rash, facial/tongue/throat swelling, SOB or lightheadedness with hypotension: Yes Has patient had a PCN reaction causing severe rash involving mucus membranes or skin necrosis: No Has patient had a PCN reaction that required hospitalization: No Has patient had a PCN reaction occurring within the last 10 years: No If all of the above answers are "NO", then may proceed with Cephalosporin use.    Family History: Family History  Problem Relation Age of Onset   Hypertension Mother    Diabetes Mother    Alcohol abuse Mother    Hyperlipidemia Mother    Hypertension Sister    Alcohol abuse Sister    Asthma Sister    Depression Sister    Drug abuse Sister    Alcohol abuse Father    Hypertension Father    Hypertension Daughter    Early death Daughter    Drug abuse Daughter    Depression Daughter    Alcohol abuse Daughter     Social History:  reports that he has been smoking cigarettes. He has a 25.00 pack-year smoking history. He has never used smokeless tobacco. He reports that he does not currently use drugs after having used the following drugs: "Crack" cocaine, Heroin, Marijuana, and LSD. He reports that he does not drink alcohol.   Physical Exam: BP (!) 162/104    Pulse 76    Ht 6\' 2"  (1.88 m)    Wt 187 lb (84.8 kg)    BMI 24.01 kg/m   Constitutional:  Alert and oriented, No acute distress. HEENT: [place] AT, moist mucus membranes.  Trachea midline, no masses. Cardiovascular: No clubbing, cyanosis, or edema. Respiratory: Normal respiratory effort, no increased work of breathing. GI: Abdomen is soft, nontender, nondistended, no abdominal masses GU: Prostate 40 g, smooth without nodules Skin: No rashes, bruises or suspicious lesions. Neurologic: Grossly intact, no focal deficits, moving all 4 extremities. Psychiatric: Normal mood and affect.  Laboratory Data:  Lab Results  Component Value Date   PSA 2.46 04/06/2021   PSA 2.52  08/13/2020     Assessment & Plan:    1.  BPH with LUTS Most bothersome symptoms are obstructive He was unable to obtain to void an estimated bladder volume by bladder scan was 3 mL He was interested in medical management and Rx tamsulosin sent to pharmacy  2.  Erectile dysfunction Rx sildenafil sent to pharmacy  Follow-up 1 month for recheck  Abbie Sons, [place] [address], [place], [place] [address] [phone]

## 2021-04-16 NOTE — Addendum Note (Signed)
Addended by: Donalee Citrin on: 04/16/2021 03:10 PM   Modules accepted: Orders

## 2021-04-18 LAB — CUP PACEART REMOTE DEVICE CHECK
Battery Remaining Percentage: 95 %
Date Time Interrogation Session: 20230217092400
Implantable Lead Implant Date: 20220818
Implantable Lead Location: 753859
Implantable Lead Model: 3501
Implantable Lead Serial Number: 218222
Implantable Pulse Generator Implant Date: 20220818
Pulse Gen Serial Number: 166520

## 2021-04-19 DIAGNOSIS — M5417 Radiculopathy, lumbosacral region: Secondary | ICD-10-CM | POA: Diagnosis not present

## 2021-04-19 DIAGNOSIS — M9903 Segmental and somatic dysfunction of lumbar region: Secondary | ICD-10-CM | POA: Diagnosis not present

## 2021-04-19 DIAGNOSIS — M9905 Segmental and somatic dysfunction of pelvic region: Secondary | ICD-10-CM | POA: Diagnosis not present

## 2021-04-19 DIAGNOSIS — M545 Low back pain, unspecified: Secondary | ICD-10-CM | POA: Diagnosis not present

## 2021-04-20 ENCOUNTER — Other Ambulatory Visit: Payer: Self-pay | Admitting: Urology

## 2021-04-20 DIAGNOSIS — N5201 Erectile dysfunction due to arterial insufficiency: Secondary | ICD-10-CM

## 2021-04-21 DIAGNOSIS — M9905 Segmental and somatic dysfunction of pelvic region: Secondary | ICD-10-CM | POA: Diagnosis not present

## 2021-04-21 DIAGNOSIS — M5417 Radiculopathy, lumbosacral region: Secondary | ICD-10-CM | POA: Diagnosis not present

## 2021-04-21 DIAGNOSIS — M9903 Segmental and somatic dysfunction of lumbar region: Secondary | ICD-10-CM | POA: Diagnosis not present

## 2021-04-21 DIAGNOSIS — M545 Low back pain, unspecified: Secondary | ICD-10-CM | POA: Diagnosis not present

## 2021-04-22 NOTE — Progress Notes (Signed)
Remote ICD transmission.   

## 2021-04-26 DIAGNOSIS — M9903 Segmental and somatic dysfunction of lumbar region: Secondary | ICD-10-CM | POA: Diagnosis not present

## 2021-04-26 DIAGNOSIS — M5417 Radiculopathy, lumbosacral region: Secondary | ICD-10-CM | POA: Diagnosis not present

## 2021-04-26 DIAGNOSIS — M545 Low back pain, unspecified: Secondary | ICD-10-CM | POA: Diagnosis not present

## 2021-04-26 DIAGNOSIS — M9905 Segmental and somatic dysfunction of pelvic region: Secondary | ICD-10-CM | POA: Diagnosis not present

## 2021-04-30 ENCOUNTER — Telehealth (INDEPENDENT_AMBULATORY_CARE_PROVIDER_SITE_OTHER): Payer: Medicare Other | Admitting: Family

## 2021-04-30 ENCOUNTER — Other Ambulatory Visit: Payer: Self-pay | Admitting: Family

## 2021-04-30 ENCOUNTER — Ambulatory Visit: Payer: Medicare Other

## 2021-04-30 DIAGNOSIS — J449 Chronic obstructive pulmonary disease, unspecified: Secondary | ICD-10-CM

## 2021-04-30 DIAGNOSIS — J40 Bronchitis, not specified as acute or chronic: Secondary | ICD-10-CM | POA: Diagnosis not present

## 2021-04-30 MED ORDER — OSELTAMIVIR PHOSPHATE 75 MG PO CAPS: 75.0000 mg | ORAL_CAPSULE | Freq: Two times a day (BID) | ORAL | 0 refills | Status: DC

## 2021-04-30 MED ORDER — BENZONATATE 100 MG PO CAPS: 100.0000 mg | ORAL_CAPSULE | Freq: Three times a day (TID) | ORAL | 1 refills | Status: DC | PRN

## 2021-04-30 NOTE — Assessment & Plan Note (Signed)
Patient is not labored in speech.  He is afebrile.  COVID test today is negative and have advised him to retest in 3 to 4 days as he may have tested too soon.  Abrupt onset cough, body aches and I shared my concern with influenza.  Patient polited declines coming in the office today for formal PCR testing of flu, covid and to obtain CXR.  Due to his complex comorbidities, history of COPD, cardiac history, we jointly agreed to start Tamiflu empirically for influenza.  I also provided him with Tessalon.  Encouraged him to use albuterol inhaler every 4-6 hours.  Understands to return if symptoms were to worsen or certainly shortness of breath worsen, he understands to go immediately to emergency room for in person evaluation. ?

## 2021-04-30 NOTE — Patient Instructions (Addendum)
Please retest for COVID in 3 to 4 days to be sure that result today is accurate  ? ?use albuterol every 6 hours for first 24 hours to get good medication into the lungs and loosen congestion; after, you may use as needed and eventually stop all together when cough resolves. ? ?Start tamiflu for concern for flu.  ? ?Start tessalon  for cough.  ? ?If you do not feel better or certainly if shortness of breath were to worsen in any way or you develop chest pain, you must go to emergency room for further in person evaluation ? ? ? ?

## 2021-04-30 NOTE — Progress Notes (Signed)
Virtual Visit via Video Note ? ?I connected with@ ? on 04/30/21 at 11:15 AM EST by a video enabled telemedicine application and verified that I am speaking with the correct person using two identifiers. ? Location patient: home ?Location provider:work  ?Persons participating in the virtual visit: patient, provider ? ?I discussed the limitations of evaluation and management by telemedicine and the availability of in person appointments. The patient expressed understanding and agreed to proceed. ? ? ?HPI: ?Productive cough started yesterday, worse today. ?Endorses sob after coughing spell otherwise he denies SOB. He endorses HA,myalgias.  Coughing at night and occasional wheeze ?No CP, fever, chills ?Negative covid test today ?Drinking water , pedialyte. Appetite decreased.  ?Using symbicort . Hasnt used albuterol.  ?H/o COPD.  ? ?Doesnt have pulse oximeter at home ? ?Influenza 02/09/22 ? ?Wife and son are home with him ? ?ROS: See pertinent positives and negatives per HPI. ? ? ? ?EXAM: ? ?VITALS per patient if applicable: ?There were no vitals taken for this visit. ?BP Readings from Last 3 Encounters:  ?04/16/21 (!) 162/104  ?04/06/21 (!) 142/88  ?02/09/21 (!) 142/92  ? ?Wt Readings from Last 3 Encounters:  ?04/16/21 187 lb (84.8 kg)  ?04/06/21 185 lb 9.6 oz (84.2 kg)  ?02/09/21 178 lb 9.6 oz (81 kg)  ? ? ?GENERAL: alert, oriented, appears well and in no acute distress ? ?HEENT: atraumatic, conjunttiva clear, no obvious abnormalities on inspection of external nose and ears ? ?NECK: normal movements of the head and neck ? ?LUNGS: on inspection no signs of respiratory distress, breathing rate appears normal, no obvious gross SOB, gasping or wheezing ? ?CV: no obvious cyanosis ? ?MS: moves all visible extremities without noticeable abnormality ? ?PSYCH/NEURO: pleasant and cooperative, no obvious depression or anxiety, speech and thought processing grossly intact ? ?ASSESSMENT AND PLAN: ? ?Discussed the following  assessment and plan: ? ?Problem List Items Addressed This Visit   ? ?  ? Respiratory  ? Bronchitis - Primary  ?  Patient is not labored in speech.  He is afebrile.  COVID test today is negative and have advised him to retest in 3 to 4 days as he may have tested too soon.  Abrupt onset cough, body aches and I shared my concern with influenza.  Patient polited declines coming in the office today for formal PCR testing of flu, covid and to obtain CXR.  Due to his complex comorbidities, history of COPD, cardiac history, we jointly agreed to start Tamiflu empirically for influenza.  I also provided him with Tessalon.  Encouraged him to use albuterol inhaler every 4-6 hours.  Understands to return if symptoms were to worsen or certainly shortness of breath worsen, he understands to go immediately to emergency room for in person evaluation. ?  ?  ? Relevant Medications  ? benzonatate (TESSALON) 100 MG capsule  ? oseltamivir (TAMIFLU) 75 MG capsule  ? ? ?-we discussed possible serious and likely etiologies, options for evaluation and workup, limitations of telemedicine visit vs in person visit, treatment, treatment risks and precautions. Pt prefers to treat via telemedicine empirically rather then risking or undertaking an in person visit at this moment.  ?. ?  ?I discussed the assessment and treatment plan with the patient. The patient was provided an opportunity to ask questions and all were answered. The patient agreed with the plan and demonstrated an understanding of the instructions. ?  ?The patient was advised to call back or seek an in-person evaluation if the symptoms worsen  or if the condition fails to improve as anticipated. ? ? ?Mable Paris, FNP  ?

## 2021-05-05 ENCOUNTER — Other Ambulatory Visit: Payer: Self-pay | Admitting: Family

## 2021-05-05 ENCOUNTER — Other Ambulatory Visit: Payer: Self-pay | Admitting: Nephrology

## 2021-05-05 DIAGNOSIS — D631 Anemia in chronic kidney disease: Secondary | ICD-10-CM | POA: Diagnosis not present

## 2021-05-05 DIAGNOSIS — N2581 Secondary hyperparathyroidism of renal origin: Secondary | ICD-10-CM | POA: Diagnosis not present

## 2021-05-05 DIAGNOSIS — N281 Cyst of kidney, acquired: Secondary | ICD-10-CM

## 2021-05-05 DIAGNOSIS — E118 Type 2 diabetes mellitus with unspecified complications: Secondary | ICD-10-CM | POA: Diagnosis not present

## 2021-05-05 DIAGNOSIS — N1832 Chronic kidney disease, stage 3b: Secondary | ICD-10-CM

## 2021-05-05 DIAGNOSIS — I509 Heart failure, unspecified: Secondary | ICD-10-CM | POA: Diagnosis not present

## 2021-05-05 DIAGNOSIS — J449 Chronic obstructive pulmonary disease, unspecified: Secondary | ICD-10-CM

## 2021-05-05 DIAGNOSIS — I10 Essential (primary) hypertension: Secondary | ICD-10-CM | POA: Diagnosis not present

## 2021-05-05 DIAGNOSIS — R809 Proteinuria, unspecified: Secondary | ICD-10-CM

## 2021-05-05 DIAGNOSIS — N189 Chronic kidney disease, unspecified: Secondary | ICD-10-CM

## 2021-05-05 DIAGNOSIS — Z95810 Presence of automatic (implantable) cardiac defibrillator: Secondary | ICD-10-CM | POA: Diagnosis not present

## 2021-05-14 ENCOUNTER — Ambulatory Visit: Payer: Medicare Other | Admitting: Urology

## 2021-06-02 DIAGNOSIS — R809 Proteinuria, unspecified: Secondary | ICD-10-CM | POA: Diagnosis not present

## 2021-06-02 DIAGNOSIS — N281 Cyst of kidney, acquired: Secondary | ICD-10-CM | POA: Diagnosis not present

## 2021-06-02 DIAGNOSIS — D631 Anemia in chronic kidney disease: Secondary | ICD-10-CM | POA: Diagnosis not present

## 2021-06-02 DIAGNOSIS — I509 Heart failure, unspecified: Secondary | ICD-10-CM | POA: Diagnosis not present

## 2021-06-02 DIAGNOSIS — N2581 Secondary hyperparathyroidism of renal origin: Secondary | ICD-10-CM | POA: Diagnosis not present

## 2021-06-02 DIAGNOSIS — I10 Essential (primary) hypertension: Secondary | ICD-10-CM | POA: Diagnosis not present

## 2021-06-02 DIAGNOSIS — N1832 Chronic kidney disease, stage 3b: Secondary | ICD-10-CM | POA: Diagnosis not present

## 2021-06-02 DIAGNOSIS — Z95810 Presence of automatic (implantable) cardiac defibrillator: Secondary | ICD-10-CM | POA: Diagnosis not present

## 2021-06-03 ENCOUNTER — Other Ambulatory Visit: Payer: Self-pay | Admitting: Physician Assistant

## 2021-06-15 ENCOUNTER — Telehealth: Payer: Self-pay | Admitting: *Deleted

## 2021-06-15 DIAGNOSIS — J40 Bronchitis, not specified as acute or chronic: Secondary | ICD-10-CM

## 2021-06-15 MED ORDER — BENZONATATE 100 MG PO CAPS: 100.0000 mg | ORAL_CAPSULE | Freq: Three times a day (TID) | ORAL | 1 refills | Status: AC | PRN

## 2021-06-15 NOTE — Telephone Encounter (Signed)
Patient says he tried smoking and became short of breath and started him back to coughing he ask could PCP refill Tessalon perles for cough has appt scheduled for 06/16/21 at 12 for possible COPD flare .  Advised patient if became short of breath talking or on exertion he would need to go to the ED. Patient is speaking in full sentences and cannot hear any audible wheezing on phone. I scheduled him at 12 tomorrow please advise okay and on refill? ?

## 2021-06-15 NOTE — Telephone Encounter (Signed)
Noted. ?Agree with advice.  Please refill Tessalon ?

## 2021-06-15 NOTE — Telephone Encounter (Signed)
Patient aware to keep appoint ment and script sent to pharmacy. ?

## 2021-06-16 ENCOUNTER — Ambulatory Visit (INDEPENDENT_AMBULATORY_CARE_PROVIDER_SITE_OTHER): Payer: Medicare Other | Admitting: Family

## 2021-06-16 ENCOUNTER — Encounter: Payer: Self-pay | Admitting: Family

## 2021-06-16 ENCOUNTER — Ambulatory Visit (INDEPENDENT_AMBULATORY_CARE_PROVIDER_SITE_OTHER): Payer: Medicare Other

## 2021-06-16 VITALS — BP 132/78 | HR 90 | Temp 97.9°F | Ht 75.0 in | Wt 187.8 lb

## 2021-06-16 DIAGNOSIS — J441 Chronic obstructive pulmonary disease with (acute) exacerbation: Secondary | ICD-10-CM

## 2021-06-16 DIAGNOSIS — R059 Cough, unspecified: Secondary | ICD-10-CM | POA: Diagnosis not present

## 2021-06-16 DIAGNOSIS — R0602 Shortness of breath: Secondary | ICD-10-CM | POA: Diagnosis not present

## 2021-06-16 DIAGNOSIS — Z720 Tobacco use: Secondary | ICD-10-CM

## 2021-06-16 DIAGNOSIS — I110 Hypertensive heart disease with heart failure: Secondary | ICD-10-CM | POA: Diagnosis not present

## 2021-06-16 DIAGNOSIS — I509 Heart failure, unspecified: Secondary | ICD-10-CM | POA: Diagnosis not present

## 2021-06-16 LAB — CBC WITH DIFFERENTIAL/PLATELET
Basophils Absolute: 0 10*3/uL (ref 0.0–0.1)
Basophils Relative: 0.8 % (ref 0.0–3.0)
Eosinophils Absolute: 0.2 10*3/uL (ref 0.0–0.7)
Eosinophils Relative: 4.2 % (ref 0.0–5.0)
HCT: 42.1 % (ref 39.0–52.0)
Hemoglobin: 14.1 g/dL (ref 13.0–17.0)
Lymphocytes Relative: 30.5 % (ref 12.0–46.0)
Lymphs Abs: 1.2 10*3/uL (ref 0.7–4.0)
MCHC: 33.4 g/dL (ref 30.0–36.0)
MCV: 90.1 fl (ref 78.0–100.0)
Monocytes Absolute: 0.3 10*3/uL (ref 0.1–1.0)
Monocytes Relative: 8.4 % (ref 3.0–12.0)
Neutro Abs: 2.3 10*3/uL (ref 1.4–7.7)
Neutrophils Relative %: 56.1 % (ref 43.0–77.0)
Platelets: 103 10*3/uL — ABNORMAL LOW (ref 150.0–400.0)
RBC: 4.67 Mil/uL (ref 4.22–5.81)
RDW: 14.9 % (ref 11.5–15.5)
WBC: 4.1 10*3/uL (ref 4.0–10.5)

## 2021-06-16 LAB — COMPREHENSIVE METABOLIC PANEL
ALT: 12 U/L (ref 0–53)
AST: 14 U/L (ref 0–37)
Albumin: 3.9 g/dL (ref 3.5–5.2)
Alkaline Phosphatase: 52 U/L (ref 39–117)
BUN: 29 mg/dL — ABNORMAL HIGH (ref 6–23)
CO2: 28 mEq/L (ref 19–32)
Calcium: 9 mg/dL (ref 8.4–10.5)
Chloride: 107 mEq/L (ref 96–112)
Creatinine, Ser: 1.76 mg/dL — ABNORMAL HIGH (ref 0.40–1.50)
GFR: 39.2 mL/min — ABNORMAL LOW (ref >60.00)
Glucose, Bld: 94 mg/dL (ref 70–99)
Potassium: 4.2 mEq/L (ref 3.5–5.1)
Sodium: 139 mEq/L (ref 135–145)
Total Bilirubin: 1 mg/dL (ref 0.2–1.2)
Total Protein: 7.5 g/dL (ref 6.0–8.3)

## 2021-06-16 LAB — BRAIN NATRIURETIC PEPTIDE: Pro B Natriuretic peptide (BNP): >5072 pg/mL — ABNORMAL HIGH (ref 0.0–100.0)

## 2021-06-16 MED ORDER — DOXYCYCLINE HYCLATE 100 MG PO TABS: 100.0000 mg | ORAL_TABLET | Freq: Two times a day (BID) | ORAL | 0 refills | Status: DC

## 2021-06-16 NOTE — Assessment & Plan Note (Signed)
He is weaning down from cigarettes. Will discuss chantix when he is feeling better.  ?

## 2021-06-16 NOTE — Progress Notes (Signed)
Patient stated that he quit smoking and has been having shortness of breath and coughing  alot since then fatigued as well ?

## 2021-06-16 NOTE — Patient Instructions (Addendum)
Start doxycycline ( antibiotic) ?Ensure to take probiotics while on antibiotics and also for 2 weeks after completion. This can either be by eating yogurt daily or taking a probiotic supplement over the counter such as Culturelle.It is important to re-colonize the gut with good bacteria and also to prevent any diarrheal infections associated with antibiotic use.  ? ?Start PLAIN mucinex over the counter to break  up thick congestion.  ? ?Start symbicort as daily preventative inhaler.  ? ?Use albuterol every 6 hours for first 24 hours to get good medication into the lungs and loosen congestion; after, you may use as needed and eventually stop all together when cough resolves. ? ? ?Let me know how you are doing and certainly if any new concerns or symptoms fail to resolve ? ? ?

## 2021-06-16 NOTE — Progress Notes (Signed)
? ?Subjective:  ? ? Patient ID: Jonathon Rios, male    DOB: January 07, 1951, 71 y.o.   MRN: [id number] ? ?CC: Jonathon Rios is a 71 y.o. male who presents today for an acute visit.   ? ?HPI: Complains of cough, wheezing, DOE x 1 weeks ?Increased sputum production ?DOE when leaving to tie shoes and with long distances. He was 'a little' short of breath when walking from parking lot to our office. He will rest and then resolves. ?'Slowing weaning' off of cigarettes . He is using nicorette gum with some effectiveness ?After a cigarette, he coughs 'like crazy' ? ?Smoked since 71 years old ? ?Using albuterol 6times per day and tessalon with relief.  ? ? ?No fever, leg swelling, increase in weight, CP, palpitations, orthopnea ? ?H/o CHF ?Compliant with lasix '40mg'$  qd ? ? ? ?Follow up with nephrology 06/02/21 for CKD ? ?Previously treated for suspected bronchitis 04/30/2021 ?Started tamiflu for suspected influenza, tessalon ?QTc [phone] 01/11/21 ? ?HISTORY:  ?Past Medical History:  ?Diagnosis Date  ? Alcoholism and drug addiction in family   ? Arthritis   ? CHF (congestive heart failure) ([facility])   ? Coronary artery disease   ? Heart disease   ? Hypertension   ? Irregular heart beat   ? Positive TB test   ? Seizure [facility])   ? witnessed by family in restaurant  ? ?Past Surgical History:  ?Procedure Laterality Date  ? CARDIAC CATHETERIZATION    ? RIGHT/LEFT HEART CATH AND CORONARY ANGIOGRAPHY N/A 07/30/2020  ? Procedure: RIGHT/LEFT HEART CATH AND CORONARY ANGIOGRAPHY poss PCI;  Surgeon: Wellington Hampshire, MD;  Location: [facility];  Service: Cardiovascular;  Laterality: N/A;  ? SUBQ ICD IMPLANT N/A 10/15/2020  ? Procedure: SUBQ ICD IMPLANT;  Surgeon: Vickie Epley, MD;  Location: [facility];  Service: Cardiovascular;  Laterality: N/A;  ? TONSILLECTOMY    ? ?Family History  ?Problem Relation Age of Onset  ? Hypertension Mother   ? Diabetes Mother   ? Alcohol abuse Mother   ? Hyperlipidemia Mother   ? Hypertension  Sister   ? Alcohol abuse Sister   ? Asthma Sister   ? Depression Sister   ? Drug abuse Sister   ? Alcohol abuse Father   ? Hypertension Father   ? Hypertension Daughter   ? Early death Daughter   ? Drug abuse Daughter   ? Depression Daughter   ? Alcohol abuse Daughter   ? ? ?Allergies: Penicillins ?Current Outpatient Medications on File Prior to Visit  ?Medication Sig Dispense Refill  ? acetaminophen (TYLENOL) 325 MG tablet Take 2 tablets (650 mg total) by mouth every 6 (six) hours as needed for fever.    ? albuterol (VENTOLIN HFA) 108 (90 Base) MCG/ACT inhaler TAKE 2 PUFFS BY MOUTH EVERY 6 HOURS AS NEEDED FOR WHEEZE OR SHORTNESS OF BREATH 8.5 each 1  ? aspirin EC 81 MG tablet Take 1 tablet (81 mg total) by mouth daily.    ? benzonatate (TESSALON) 100 MG capsule Take 1 capsule (100 mg total) by mouth 3 (three) times daily as needed for cough. 20 capsule 1  ? budesonide-formoterol (SYMBICORT) 160-4.5 MCG/ACT inhaler TAKE 2 PUFFS BY MOUTH TWICE A DAY 10.2 each 1  ? ENTRESTO 97-103 MG TAKE 1 TABLET BY MOUTH TWICE A DAY 60 tablet 6  ? FARXIGA 10 MG TABS tablet TAKE 1 TABLET BY MOUTH DAILY BEFORE BREAKFAST. 30 tablet 0  ? feeding supplement (ENSURE ENLIVE /  ENSURE PLUS) LIQD Take 237 mLs by mouth 3 (three) times daily between meals.    ? Multiple Vitamin (MULTIVITAMIN WITH MINERALS) TABS tablet Take 1 tablet by mouth daily.    ? oseltamivir (TAMIFLU) 75 MG capsule Take 1 capsule (75 mg total) by mouth 2 (two) times daily. 10 capsule 0  ? rosuvastatin (CRESTOR) 10 MG tablet TAKE 1 TABLET BY MOUTH EVERY DAY 90 tablet 3  ? tamsulosin (FLOMAX) 0.4 MG CAPS capsule Take 1 capsule (0.4 mg total) by mouth daily. 30 capsule 11  ? carvedilol (COREG) 12.5 MG tablet Take 2 tablets (25 mg total) by mouth 2 (two) times daily. 60 tablet 3  ? furosemide (LASIX) 40 MG tablet Take 1 tablet (40 mg total) by mouth daily as needed (As needed for weight gain 3 lbs in 1 day or 5 lbs over dry weight). 30 tablet 5  ? sildenafil (VIAGRA) 100 MG  tablet TAKE 1 TABLET BY MOUTH 1 HOUR PRIOR TO INTERCOURSE (Patient not taking: Reported on 06/16/2021) 30 tablet 1  ? ?No current facility-administered medications on file prior to visit.  ? ? ?Social History  ? ?Tobacco Use  ? Smoking status: Some Days  ?  Packs/day: 0.50  ?  Years: 50.00  ?  Pack years: 25.00  ?  Types: Cigarettes  ?  Last attempt to quit: 02/23/2017  ?  Years since quitting: 4.3  ? Smokeless tobacco: Never  ?Vaping Use  ? Vaping Use: Never used  ?Substance Use Topics  ? Alcohol use: No  ? Drug use: Not Currently  ?  Types: "Crack" cocaine, Heroin, Marijuana, LSD  ?  Comment: Sober since 2018.   ? ? ?Review of Systems  ?Constitutional:  Negative for chills and fever.  ?HENT:  Positive for congestion. Negative for facial swelling, sinus pain and sore throat.   ?Respiratory:  Positive for cough, shortness of breath and wheezing.   ?Cardiovascular:  Negative for chest pain, palpitations and leg swelling.  ?Gastrointestinal:  Negative for nausea and vomiting.  ?   ?Objective:  ?  ?BP 132/78 (BP Location: Left Arm, Patient Position: Sitting, Cuff Size: Normal)   Pulse 90   Temp 97.9 ?F (36.6 ?C) (Oral)   Ht '6\' 3"'$  (1.905 m)   Wt 187 lb 12.8 oz (85.2 kg)   SpO2 99%   BMI 23.47 kg/m?  ? ? ?Physical Exam ?Vitals reviewed.  ?Constitutional:   ?   Appearance: He is well-developed.  ?HENT:  ?   Head: Normocephalic and atraumatic.  ?   Right Ear: Hearing, tympanic membrane, ear canal and external ear normal. No decreased hearing noted. No drainage, swelling or tenderness. No middle ear effusion. Tympanic membrane is not injected, erythematous or bulging.  ?   Left Ear: Hearing, tympanic membrane, ear canal and external ear normal. No decreased hearing noted. No drainage, swelling or tenderness.  No middle ear effusion. Tympanic membrane is not injected, erythematous or bulging.  ?   Nose: Nose normal.  ?   Right Sinus: No maxillary sinus tenderness or frontal sinus tenderness.  ?   Left Sinus: No  maxillary sinus tenderness or frontal sinus tenderness.  ?   Mouth/Throat:  ?   Pharynx: Uvula midline. No oropharyngeal exudate or posterior oropharyngeal erythema.  ?   Tonsils: No tonsillar abscesses.  ?Eyes:  ?   Conjunctiva/sclera: Conjunctivae normal.  ?Cardiovascular:  ?   Rate and Rhythm: Regular rhythm.  ?   Heart sounds: Normal heart sounds.  ?Pulmonary:  ?  Effort: Pulmonary effort is normal. No respiratory distress.  ?   Breath sounds: Examination of the right-lower field reveals decreased breath sounds. Examination of the left-lower field reveals decreased breath sounds. Decreased breath sounds present. No wheezing, rhonchi or rales.  ?   Comments: Few expiratory wheezes ?Musculoskeletal:  ?   Right lower leg: No edema.  ?   Left lower leg: No edema.  ?Lymphadenopathy:  ?   Head:  ?   Right side of head: No submental, submandibular, tonsillar, preauricular, posterior auricular or occipital adenopathy.  ?   Left side of head: No submental, submandibular, tonsillar, preauricular, posterior auricular or occipital adenopathy.  ?   Cervical: No cervical adenopathy.  ?Skin: ?   General: Skin is warm and dry.  ?Neurological:  ?   Mental Status: He is alert.  ?Psychiatric:     ?   Speech: Speech normal.     ?   Behavior: Behavior normal.  ? ?Patient felt significantly better after albuterol treatment. Lung sounds clear and increased ? ?   ?Assessment & Plan:  ? ?Problem List Items Addressed This Visit   ? ?  ? Respiratory  ? COPD exacerbation ([place]) - Primary  ?  Uncontrolled. Concern for PNA versus COPD exacerbation. He has recently decreased number of cigarettes. Walked with patient up and down our hallways and he declined SOB. He was not labored in speech as we talked. sa02 remained 98%. Afebrile and nontoxic in appearance. He doesn't appear to be volume overloaded. No associated CP.  ?Pending CXR, BNP.  ?Duoneb given. PCN allergic so I didn't start augmentin. Based on most recent EKG QTc, opted not to start  macrolid. No recent antibiotic in last 3 months nor hospitalization. Start doxycycline symbicort, mucinex, probiotics. He will continue tessalon. Discussed prednisone taper , patient politely declined unless symptoms f

## 2021-06-16 NOTE — Assessment & Plan Note (Addendum)
Uncontrolled. Concern for PNA versus COPD exacerbation. He has recently decreased number of cigarettes. Walked with patient up and down our hallways and he declined SOB. He was not labored in speech as we talked. sa02 remained 98%. Afebrile and nontoxic in appearance. He doesn't appear to be volume overloaded. No associated CP.  ?Pending CXR, BNP.  ?Duoneb given. PCN allergic so I didn't start augmentin. Based on most recent EKG QTc, opted not to start macrolid. No recent antibiotic in last 3 months nor hospitalization. Start doxycycline symbicort, mucinex, probiotics. He will continue tessalon. Discussed prednisone taper , patient politely declined unless symptoms fail to improve. He will let me know how he is doing. ?Close follow up.  ?

## 2021-06-17 ENCOUNTER — Telehealth: Payer: Self-pay | Admitting: Family

## 2021-06-17 NOTE — Telephone Encounter (Signed)
LMTCB to office to speak to [facility] ?

## 2021-06-17 NOTE — Telephone Encounter (Signed)
Call pt ?Ask how is breathing is? Is he having more shortness of breath, wheezing? ?Confimg  he is taking lasix '40mg'$  every day ? ?I am concerned based on labs that he is having a heart failure exacerbation and would feel most comfortable if cardiology saw him as well ?Waiting on chest xray results at this time ? ?Please call Dr Fletcher Anon and Darral Dash office chmg tight away and ask for an appt for heart failure exacerbation today/ tomorrow. ? ?

## 2021-06-17 NOTE — Telephone Encounter (Signed)
Spoke to patient about notes and went into detail about everything in the note. Confirmed he is taking the Lasix 40 mg. Stressed to him how important it was for him to relax so that shortness of breathe does not get any worse. Patient stated that he was still having Shortness of breath but didn't think he needed to go to ED. Reiterated how important it is for him to make it to his appointment on tomorrow.Patient verbalized understanding and stated that he would call and schedule follow up after his appointment with Cardiology tomorrow.  ?

## 2021-06-17 NOTE — Telephone Encounter (Signed)
Jonathon Rios,  ? ?[name] is seeing you tomorrow 4/21 for increased SOB, increased sputum, wheezing. I am concerned for COPD exacerbation, with overlay of CHF exacerbation. BNP is elevated , however in setting of CKD, this appears chronic for him. His weight was stable and no leg swelling. He is taking lasix '40mg'$  qd. CXR looks good. I started him on doxycycline and symbicort. Thanks for seeing him and let me know if any questions. ? ? ? ? ? ?Jenate, ? ?Call pt and add to previous note ?Ensure you document how he is breathing today and if sob has worsened/improved. ? ? ?Chest xray returned without evidence of pneumonia. Mild hyperinflation which likely is from COPD.  ?He has arthritic changes in thoracic spine. ? ?Electrolytes are stable.  ?Renal function stable, but slightly worsened. He needs to continue to follow with nephrology.  ?Platelets are low which is unchanged. Ensure he has follow up with me so we can repeat CBC lab.  ? ?BNP lab is elevated > 5000. Please confirm lasix dosing '40mg'$ . We have scheduled him an appt tomorrow with cardiology ( give him details).  ? ?In the setting of kidney disease and elevation in BNP hesitant to increase lasix without cardiology seeing him first.  ?  ?Please advise him to stay vililant for increase sob, leg swelling, weight gain in which would warrant him going to ED today ? ? ? ?

## 2021-06-18 ENCOUNTER — Encounter: Payer: Self-pay | Admitting: Nurse Practitioner

## 2021-06-18 ENCOUNTER — Ambulatory Visit (INDEPENDENT_AMBULATORY_CARE_PROVIDER_SITE_OTHER): Payer: Medicare Other | Admitting: Nurse Practitioner

## 2021-06-18 VITALS — BP 168/120 | HR 80 | Ht 74.0 in | Wt 184.5 lb

## 2021-06-18 DIAGNOSIS — E782 Mixed hyperlipidemia: Secondary | ICD-10-CM | POA: Diagnosis not present

## 2021-06-18 DIAGNOSIS — Z720 Tobacco use: Secondary | ICD-10-CM | POA: Diagnosis not present

## 2021-06-18 DIAGNOSIS — I428 Other cardiomyopathies: Secondary | ICD-10-CM | POA: Diagnosis not present

## 2021-06-18 DIAGNOSIS — I5023 Acute on chronic systolic (congestive) heart failure: Secondary | ICD-10-CM | POA: Diagnosis not present

## 2021-06-18 DIAGNOSIS — J441 Chronic obstructive pulmonary disease with (acute) exacerbation: Secondary | ICD-10-CM | POA: Diagnosis not present

## 2021-06-18 DIAGNOSIS — N183 Chronic kidney disease, stage 3 unspecified: Secondary | ICD-10-CM | POA: Diagnosis not present

## 2021-06-18 DIAGNOSIS — I10 Essential (primary) hypertension: Secondary | ICD-10-CM | POA: Diagnosis not present

## 2021-06-18 MED ORDER — FUROSEMIDE 40 MG PO TABS: 40.0000 mg | ORAL_TABLET | Freq: Every day | ORAL | 0 refills | Status: DC

## 2021-06-18 NOTE — Patient Instructions (Signed)
Medication Instructions:  ? ?Your physician has recommended you make the following change in your medication:  ? ?CHANGE Furosemide (Lasix) 40 mg DAILY  ? ?*If you need a refill on your cardiac medications before your next appointment, please call your pharmacy* ? ? ?Lab Work: ? ?None ordered ? ?Testing/Procedures: ? ?None ordered ? ? ?Follow-Up: ?At [facility], you and your health needs are our priority.  As part of our continuing mission to provide you with exceptional heart care, we have created designated Provider Care Teams.  These Care Teams include your primary Cardiologist (physician) and Advanced Practice Providers (APPs -  Physician Assistants and Nurse Practitioners) who all work together to provide you with the care you need, when you need it. ? ?We recommend signing up for the patient portal called "MyChart".  Sign up information is provided on this After Visit Summary.  MyChart is used to connect with patients for Virtual Visits (Telemedicine).  Patients are able to view lab/test results, encounter notes, upcoming appointments, etc.  Non-urgent messages can be sent to your provider as well.   ?To learn more about what you can do with MyChart, go to [email or web address].   ? ?Your next appointment:   ?1 week(s) ? ?The format for your next appointment:   ?In Person ? ?Provider:   ?You may see [name], MD or one of the following Advanced Practice Providers on your designated Care Team:   ?Murray Hodgkins, NP ? ? ?Important Information About Sugar ? ? ? ? ? ? ?
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
## 2021-06-18 NOTE — Telephone Encounter (Signed)
Noted  

## 2021-06-24 NOTE — Addendum Note (Signed)
Addended by: Britt Bottom on: 06/24/2021 01:12 PM ? ? Modules accepted: Orders ? ?

## 2021-06-25 ENCOUNTER — Encounter: Payer: Self-pay | Admitting: Cardiovascular Disease

## 2021-06-25 ENCOUNTER — Other Ambulatory Visit
Admission: RE | Admit: 2021-06-25 | Discharge: 2021-06-25 | Disposition: A | Discharge status: Home / Self Care | Payer: Medicare Other | Attending: Cardiovascular Disease | Admitting: Cardiovascular Disease

## 2021-06-25 ENCOUNTER — Telehealth: Payer: Self-pay

## 2021-06-25 ENCOUNTER — Ambulatory Visit (INDEPENDENT_AMBULATORY_CARE_PROVIDER_SITE_OTHER): Payer: Medicare Other | Admitting: Cardiovascular Disease

## 2021-06-25 VITALS — BP 110/90 | HR 77 | Ht 74.0 in | Wt 177.2 lb

## 2021-06-25 DIAGNOSIS — N183 Chronic kidney disease, stage 3 unspecified: Secondary | ICD-10-CM | POA: Diagnosis not present

## 2021-06-25 DIAGNOSIS — I5022 Chronic systolic (congestive) heart failure: Secondary | ICD-10-CM

## 2021-06-25 DIAGNOSIS — I493 Ventricular premature depolarization: Secondary | ICD-10-CM

## 2021-06-25 DIAGNOSIS — J449 Chronic obstructive pulmonary disease, unspecified: Secondary | ICD-10-CM

## 2021-06-25 DIAGNOSIS — Z720 Tobacco use: Secondary | ICD-10-CM

## 2021-06-25 DIAGNOSIS — I10 Essential (primary) hypertension: Secondary | ICD-10-CM | POA: Diagnosis not present

## 2021-06-25 DIAGNOSIS — I5023 Acute on chronic systolic (congestive) heart failure: Secondary | ICD-10-CM | POA: Insufficient documentation

## 2021-06-25 LAB — BASIC METABOLIC PANEL
Anion gap: 9 (ref 5–15)
BUN: 35 mg/dL — ABNORMAL HIGH (ref 8–23)
CO2: 31 mmol/L (ref 22–32)
Calcium: 9.3 mg/dL (ref 8.9–10.3)
Chloride: 101 mmol/L (ref 98–111)
Creatinine, Ser: 2.01 mg/dL — ABNORMAL HIGH (ref 0.61–1.24)
GFR, Estimated: 35 mL/min — ABNORMAL LOW (ref >60)
Glucose, Bld: 97 mg/dL (ref 70–99)
Potassium: 3.8 mmol/L (ref 3.5–5.1)
Sodium: 141 mmol/L (ref 135–145)

## 2021-06-25 NOTE — Patient Instructions (Signed)
Medication Instructions:  ?Your physician recommends that you continue on your current medications as directed. Please refer to the Current Medication list given to you today. ? ?*If you need a refill on your cardiac medications before your next appointment, please call your pharmacy* ? ? ?Lab Work: ?Bmp today ? ?Please have your labs drawn at the [facility]. ?Stop at the Registation desk to check in. ? ?If you have labs (blood work) drawn today and your tests are completely normal, you will receive your results only by: ?MyChart Message (if you have MyChart) OR ?A paper copy in the mail ?If you have any lab test that is abnormal or we need to change your treatment, we will call you to review the results. ? ? ?Testing/Procedures: ?None ordered ? ? ?Follow-Up: ?At [facility], you and your health needs are our priority.  As part of our continuing mission to provide you with exceptional heart care, we have created designated Provider Care Teams.  These Care Teams include your primary Cardiologist (physician) and Advanced Practice Providers (APPs -  Physician Assistants and Nurse Practitioners) who all work together to provide you with the care you need, when you need it. ? ?We recommend signing up for the patient portal called "MyChart".  Sign up information is provided on this After Visit Summary.  MyChart is used to connect with patients for Virtual Visits (Telemedicine).  Patients are able to view lab/test results, encounter notes, upcoming appointments, etc.  Non-urgent messages can be sent to your provider as well.   ?To learn more about what you can do with MyChart, go to [email or web address].   ? ?Your next appointment:   ?4 month(s) ? ?The format for your next appointment:   ?In Person ? ?Provider:   ?You may see [name], MD or one of the following Advanced Practice Providers on your designated Care Team:   ?Murray Hodgkins, NP ?[name], PA-C ?Cadence Kathlen Mody, PA-C{ ? ? ? ?Other  Instructions ?N/A ? ?Important Information About Sugar ? ? ? ? ? ? ?

## 2021-06-25 NOTE — Telephone Encounter (Signed)
Patient made aware of lab results and Dr. Tyrell Antonio recommendation with verbalized understanding. ?Lab order placed for 2 wk repeat bmp [facility]. ?
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
## 2021-06-25 NOTE — Telephone Encounter (Signed)
-----   Message from Wellington Hampshire, MD sent at 06/25/2021  2:09 PM EDT ----- ?Renal function is slightly worse but he appeared euvolemic today when I saw him.  Continue same medications and repeat basic metabolic profile in 2 weeks to ensure stability. ?

## 2021-06-30 ENCOUNTER — Other Ambulatory Visit: Payer: Self-pay | Admitting: Family

## 2021-06-30 DIAGNOSIS — J449 Chronic obstructive pulmonary disease, unspecified: Secondary | ICD-10-CM

## 2021-07-04 ENCOUNTER — Other Ambulatory Visit: Payer: Self-pay | Admitting: Family

## 2021-07-04 ENCOUNTER — Other Ambulatory Visit: Payer: Self-pay | Admitting: Physician Assistant

## 2021-07-04 DIAGNOSIS — J449 Chronic obstructive pulmonary disease, unspecified: Secondary | ICD-10-CM

## 2021-07-06 ENCOUNTER — Ambulatory Visit (INDEPENDENT_AMBULATORY_CARE_PROVIDER_SITE_OTHER): Payer: Medicare Other

## 2021-07-06 VITALS — Ht 74.0 in | Wt 177.0 lb

## 2021-07-06 DIAGNOSIS — Z1211 Encounter for screening for malignant neoplasm of colon: Secondary | ICD-10-CM

## 2021-07-06 DIAGNOSIS — Z0000 Encounter for general adult medical examination without abnormal findings: Secondary | ICD-10-CM

## 2021-07-06 NOTE — Patient Instructions (Addendum)
?  Jonathon Rios , ?Thank you for taking time to come for your Medicare Wellness Visit. I appreciate your ongoing commitment to your health goals. Please review the following plan we discussed and let me know if I can assist you in the future.  ? ?These are the goals we discussed: ? Goals   ? ?  ? Patient Stated  ?   Gain weight (pt-stated)   ?   187-200lbs ? ?  ?   Quit Smoking (pt-stated)   ?   Smoke less until able to stop smoking ?  ? ?  ?  ?This is a list of the screening recommended for you and due dates:  ?Health Maintenance  ?Topic Date Due  ? Cologuard (Stool DNA test)  04/01/2021  ? Pneumonia Vaccine (1 - PCV) 06/17/2022*  ? Zoster (Shingles) Vaccine (1 of 2) 09/14/2022*  ? Hepatitis C Screening: USPSTF Recommendation to screen - Ages 52-79 yo.  Completed  ? HPV Vaccine  Aged Out  ? Flu Shot  Discontinued  ? Tetanus Vaccine  Discontinued  ? COVID-19 Vaccine  Discontinued  ?*Topic was postponed. The date shown is not the original due date.  ?  ?
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
## 2021-07-09 ENCOUNTER — Telehealth: Payer: Self-pay

## 2021-07-09 NOTE — Telephone Encounter (Signed)
Called to do precharting but patient VM was full so I was unable to laeve Vm ?

## 2021-07-12 ENCOUNTER — Ambulatory Visit (INDEPENDENT_AMBULATORY_CARE_PROVIDER_SITE_OTHER): Payer: Medicare Other | Admitting: Family

## 2021-07-12 ENCOUNTER — Encounter: Payer: Self-pay | Admitting: Family

## 2021-07-12 VITALS — BP 126/76 | HR 77 | Temp 97.6°F | Ht 74.0 in | Wt 180.6 lb

## 2021-07-12 DIAGNOSIS — I5022 Chronic systolic (congestive) heart failure: Secondary | ICD-10-CM | POA: Diagnosis not present

## 2021-07-12 DIAGNOSIS — B354 Tinea corporis: Secondary | ICD-10-CM | POA: Diagnosis not present

## 2021-07-12 DIAGNOSIS — J42 Unspecified chronic bronchitis: Secondary | ICD-10-CM

## 2021-07-12 MED ORDER — TRIAMCINOLONE ACETONIDE 0.025 % EX CREA: 1.0000 "application " | TOPICAL_CREAM | Freq: Two times a day (BID) | CUTANEOUS | 1 refills | Status: AC

## 2021-07-12 NOTE — Assessment & Plan Note (Addendum)
No visible rash today.  Discussed with patient differentials including atopic dermatitis versus tinea corporis.  Previously he has had good results with topical corticosteroid.  Agreed to refill low potency corticosteroid and counseled him that corticosteroids can make skin lighter in color and not to use for longer than 7 days.  Previously have tried clotrimazole and he is not sure if this was effective.  May try clotrimazole if corticosteroid does not resolve symptom. I have also placed a referral to dermatology for formal evaluation. ?

## 2021-07-12 NOTE — Assessment & Plan Note (Signed)
Chronic, symptomatically stable.  Remains compliant with torsemide 40 mg.  He is following closely with cardiology, will follow ?

## 2021-07-12 NOTE — Progress Notes (Signed)
? ?Subjective:  ? ? Patient ID: Jonathon Rios, male    DOB: 09/14/1950, 71 y.o.   MRN: [id number] ? ?CC: Jonathon Rios is a 71 y.o. male who presents today for follow up.  ? ?HPI: HPI ?Follow-up for CHF, COPD exacerbation 06/16/2021. ? ?Feels well today ?No new complaints ? ?He is breathing well.  Denies shortness of breath, chest pain, leg swelling.   ? ?  He completed doxycycline.  He remains compliant with his Symbicort ? ?He was seen by cardiology 06/18/2021 with follow-up 06/25/21 ?Remains compliant with 40 mg torsemide daily.  He was continued on carvedilol, Entresto and [place]. ? ?He requested refill of topical steroid which he has been using periodically around his mouth and chin area for years with relief.  Area will sometimes be lighter in color.  he reports dry patches that will appear from time to time.  He can be itchy.  He also has a mole on his nose like to have removed. It has been there for many years ?HISTORY:  ?Past Medical History:  ?Diagnosis Date  ?? Alcoholism and drug addiction in family   ?? Arthritis   ?? Chronic HFrEF (heart failure with reduced ejection fraction) ([place])   ? a. 12/2016 Echo: EF 25-30%; b. 09/2020 Echo: EF 25-30%, glob HK, GrI DD, mod reduced RV fxn. ao root 40m.  ?? CKD (chronic kidney disease), stage III ([place]   ?? Hypertension   ?? Irregular heart beat   ?? NICM (nonischemic cardiomyopathy) ([place]   ? a. 12/2016 Echo: EF 25-30%; b. 07/2020 Echo: EF 25-30%; c. 09/2020 s/p BSX Emblem MRI S-ICD A219/166520.  ?? Nonobstructive Coronary artery disease   ? a. 07/2020 Cath: LM nl, LAD min irregs, D1/2 nl, LCX nl,OM1 mild dzs, RCA min irregs, RPDA/RPAV nl.  ?? Positive TB test   ?? Seizure ([place]   ? witnessed by family in restaurant  ? ?Past Surgical History:  ?Procedure Laterality Date  ?? CARDIAC CATHETERIZATION    ?? RIGHT/LEFT HEART CATH AND CORONARY ANGIOGRAPHY N/A 07/30/2020  ? Procedure: RIGHT/LEFT HEART CATH AND CORONARY ANGIOGRAPHY poss PCI;  Surgeon: [name] MD;   [facility];  Service: Cardiovascular;  Laterality: N/A;  ?? SUBQ ICD IMPLANT N/A 10/15/2020  ? Procedure: SUBQ ICD IMPLANT;  Surgeon: [name] MD;  [facility];  Service: Cardiovascular;  Laterality: N/A;  ?? TONSILLECTOMY    ? ?Family History  ?Problem Relation Age of Onset  ?? Hypertension Mother   ?? Diabetes Mother   ?? Alcohol abuse Mother   ?? Hyperlipidemia Mother   ?? Hypertension Sister   ?? Alcohol abuse Sister   ?? Asthma Sister   ?? Depression Sister   ?? Drug abuse Sister   ?? Alcohol abuse Father   ?? Hypertension Father   ?? Hypertension Daughter   ?? Early death Daughter   ?? Drug abuse Daughter   ?? Depression Daughter   ?? Alcohol abuse Daughter   ? ? ?Allergies: Penicillins ?Current Outpatient Medications on File Prior to Visit  ?Medication Sig Dispense Refill  ?? acetaminophen (TYLENOL) 325 MG tablet Take 2 tablets (650 mg total) by mouth every 6 (six) hours as needed for fever.    ?? albuterol (VENTOLIN HFA) 108 (90 Base) MCG/ACT inhaler TAKE 2 PUFFS BY MOUTH EVERY 6 HOURS AS NEEDED FOR WHEEZE OR SHORTNESS OF BREATH 8.5 each 1  ?? aspirin EC 81 MG tablet Take 1 tablet (81 mg total)  by mouth daily.    ?? benzonatate (TESSALON) 100 MG capsule Take 1 capsule (100 mg total) by mouth 3 (three) times daily as needed for cough. 20 capsule 1  ?? budesonide-formoterol (SYMBICORT) 160-4.5 MCG/ACT inhaler TAKE 2 PUFFS BY MOUTH TWICE A DAY 10.2 each 1  ?? ENTRESTO 97-103 MG TAKE 1 TABLET BY MOUTH TWICE A DAY 60 tablet 6  ?? FARXIGA 10 MG TABS tablet TAKE 1 TABLET BY MOUTH EVERY DAY BEFORE BREAKFAST 30 tablet 2  ?? feeding supplement (ENSURE ENLIVE / ENSURE PLUS) LIQD Take 237 mLs by mouth 3 (three) times daily between meals.    ?? furosemide (LASIX) 40 MG tablet Take 1 tablet (40 mg total) by mouth daily. 30 tablet 0  ?? Multiple Vitamin (MULTIVITAMIN WITH MINERALS) TABS tablet Take 1 tablet by mouth daily.    ?? rosuvastatin (CRESTOR) 10 MG tablet TAKE 1  TABLET BY MOUTH EVERY DAY 90 tablet 3  ?? sildenafil (VIAGRA) 100 MG tablet TAKE 1 TABLET BY MOUTH 1 HOUR PRIOR TO INTERCOURSE 30 tablet 1  ?? tamsulosin (FLOMAX) 0.4 MG CAPS capsule Take 1 capsule (0.4 mg total) by mouth daily. 30 capsule 11  ?? carvedilol (COREG) 12.5 MG tablet Take 2 tablets (25 mg total) by mouth 2 (two) times daily. 60 tablet 3  ? ?No current facility-administered medications on file prior to visit.  ? ? ?Social History  ? ?Tobacco Use  ?? Smoking status: Some Days  ?  Packs/day: 0.25  ?  Years: 50.00  ?  Pack years: 12.50  ?  Types: Cigarettes  ?  Last attempt to quit: 02/23/2017  ?  Years since quitting: 4.3  ?? Smokeless tobacco: Never  ?Vaping Use  ?? Vaping Use: Never used  ?Substance Use Topics  ?? Alcohol use: No  ?? Drug use: Not Currently  ?  Types: "Crack" cocaine, Heroin, Marijuana, LSD  ?  Comment: Sober since 2018.   ? ? ?Review of Systems  ?Constitutional:  Negative for chills and fever.  ?Respiratory:  Negative for cough and shortness of breath.   ?Cardiovascular:  Negative for chest pain, palpitations and leg swelling.  ?Gastrointestinal:  Negative for nausea and vomiting.  ?Skin:  Positive for rash.  ?   ?Objective:  ?  ?BP 126/76 (BP Location: Left Arm, Patient Position: Sitting, Cuff Size: Normal)   Pulse 77   Temp 97.6 ?F (36.4 ?C) (Oral)   Ht '6\' 2"'$  (1.88 m)   Wt 180 lb 9.6 oz (81.9 kg)   SpO2 99%   BMI 23.19 kg/m?  ?BP Readings from Last 3 Encounters:  ?07/12/21 126/76  ?06/25/21 110/90  ?06/18/21 (!) 168/120  ? ?Wt Readings from Last 3 Encounters:  ?07/12/21 180 lb 9.6 oz (81.9 kg)  ?07/06/21 177 lb (80.3 kg)  ?06/25/21 177 lb 4 oz (80.4 kg)  ? ? ?Physical Exam ?Vitals reviewed.  ?Constitutional:   ?   Appearance: He is well-developed.  ?Cardiovascular:  ?   Rate and Rhythm: Regular rhythm.  ?   Heart sounds: Normal heart sounds.  ?Pulmonary:  ?   Effort: Pulmonary effort is normal. No respiratory distress.  ?   Breath sounds: Normal breath sounds. No wheezing,  rhonchi or rales.  ?Skin: ?   General: Skin is warm and dry.  ?Neurological:  ?   Mental Status: He is alert.  ?Psychiatric:     ?   Speech: Speech normal.     ?   Behavior: Behavior normal.  ? ? ?   ?  Assessment & Plan:  ? ?Problem List Items Addressed This Visit   ? ?  ? Cardiovascular and Mediastinum  ? Chronic HFrEF (heart failure with reduced ejection fraction) ([place]) - Primary  ?  Chronic, symptomatically stable.  Remains compliant with torsemide 40 mg.  He is following closely with cardiology, will follow ? ?  ?  ?  ? Respiratory  ? COPD (chronic obstructive pulmonary disease) ([place])  ?  Chronic, symptomatically stable.  Continue Symbicort 160-4.'5mg'$ .  ? ?  ?  ?  ? Musculoskeletal and Integument  ? Tinea corporis  ?  No visible rash today.  Discussed with patient differentials including atopic dermatitis versus tinea corporis.  Previously he has had good results with topical corticosteroid.  Agreed to refill low potency corticosteroid and counseled him that corticosteroids can make skin lighter in color and not to use for longer than 7 days.  Previously have tried clotrimazole and he is not sure if this was effective.  May try clotrimazole if corticosteroid does not resolve symptom. I have also placed a referral to dermatology for formal evaluation. ? ?  ?  ? Relevant Medications  ? triamcinolone (KENALOG) 0.025 % cream  ? Other Relevant Orders  ? Ambulatory referral to Dermatology  ? ? ? ?I am having Jonathon Rios start on triamcinolone. I am also having him maintain his aspirin EC, feeding supplement, multivitamin with minerals, acetaminophen, carvedilol, rosuvastatin, Entresto, tamsulosin, sildenafil, benzonatate, furosemide, albuterol, budesonide-formoterol, and [place]. ? ? ?Meds ordered this encounter  ?Medications  ?? triamcinolone (KENALOG) 0.025 % cream  ?  Sig: Apply 1 application. topically 2 (two) times daily. Do not use longer than 7 days due to skin discoloration  ?  Dispense:  80 g  ?  Refill:   1  ?  Order Specific Question:   Supervising Provider  ?  Answer:   Crecencio Mc [[id number]]  ? ? ?Return precautions given.  ? ?Risks, benefits, and alternatives of the medications and treatment plan prescribed today

## 2021-07-12 NOTE — Assessment & Plan Note (Signed)
Chronic, symptomatically stable.  Continue Symbicort 160-4.'5mg'$ .  ?

## 2021-07-13 DIAGNOSIS — Z1211 Encounter for screening for malignant neoplasm of colon: Secondary | ICD-10-CM | POA: Diagnosis not present

## 2021-07-14 ENCOUNTER — Other Ambulatory Visit
Admission: RE | Admit: 2021-07-14 | Discharge: 2021-07-14 | Disposition: A | Discharge status: Home / Self Care | Payer: Medicare Other | Attending: Cardiovascular Disease | Admitting: Cardiovascular Disease

## 2021-07-14 ENCOUNTER — Other Ambulatory Visit: Payer: Self-pay | Admitting: *Deleted

## 2021-07-14 DIAGNOSIS — I10 Essential (primary) hypertension: Secondary | ICD-10-CM | POA: Insufficient documentation

## 2021-07-14 DIAGNOSIS — I5022 Chronic systolic (congestive) heart failure: Secondary | ICD-10-CM

## 2021-07-14 DIAGNOSIS — I493 Ventricular premature depolarization: Secondary | ICD-10-CM | POA: Insufficient documentation

## 2021-07-14 DIAGNOSIS — N183 Chronic kidney disease, stage 3 unspecified: Secondary | ICD-10-CM | POA: Insufficient documentation

## 2021-07-14 DIAGNOSIS — Z79899 Other long term (current) drug therapy: Secondary | ICD-10-CM | POA: Diagnosis not present

## 2021-07-14 LAB — BASIC METABOLIC PANEL
Anion gap: 8 (ref 5–15)
BUN: 30 mg/dL — ABNORMAL HIGH (ref 8–23)
CO2: 28 mmol/L (ref 22–32)
Calcium: 9 mg/dL (ref 8.9–10.3)
Chloride: 107 mmol/L (ref 98–111)
Creatinine, Ser: 1.78 mg/dL — ABNORMAL HIGH (ref 0.61–1.24)
GFR, Estimated: 41 mL/min — ABNORMAL LOW (ref >60)
Glucose, Bld: 91 mg/dL (ref 70–99)
Potassium: 3.9 mmol/L (ref 3.5–5.1)
Sodium: 143 mmol/L (ref 135–145)

## 2021-07-14 NOTE — Progress Notes (Signed)
BMP order placed

## 2021-07-16 ENCOUNTER — Ambulatory Visit (INDEPENDENT_AMBULATORY_CARE_PROVIDER_SITE_OTHER): Payer: Medicare Other

## 2021-07-16 DIAGNOSIS — I428 Other cardiomyopathies: Secondary | ICD-10-CM | POA: Diagnosis not present

## 2021-07-19 LAB — CUP PACEART REMOTE DEVICE CHECK
Battery Remaining Percentage: 92 %
Date Time Interrogation Session: 20230519214700
Implantable Lead Implant Date: 20220818
Implantable Lead Location: 753859
Implantable Lead Model: 3501
Implantable Lead Serial Number: 218222
Implantable Pulse Generator Implant Date: 20220818
Pulse Gen Serial Number: 166520

## 2021-07-22 LAB — COLOGUARD: COLOGUARD: POSITIVE — AB

## 2021-07-22 NOTE — Progress Notes (Signed)
Remote ICD transmission.   

## 2021-07-23 ENCOUNTER — Telehealth: Payer: Self-pay | Admitting: Family

## 2021-07-23 DIAGNOSIS — R195 Other fecal abnormalities: Secondary | ICD-10-CM

## 2021-07-23 NOTE — Telephone Encounter (Signed)
I tried to call patient & was unable to LM due to VM being full.

## 2021-07-23 NOTE — Telephone Encounter (Signed)
Call patient Cologuard is positive for blood.  This would warrant colonoscopy.   Ref to GI placed Let us know if you dont hear back within a week in regards to an appointment being scheduled.

## 2021-07-27 ENCOUNTER — Telehealth: Payer: Self-pay

## 2021-07-27 NOTE — Telephone Encounter (Signed)
Pt was called and made aware that GI had reached out to him. He was given number to call for appointment.

## 2021-07-27 NOTE — Telephone Encounter (Signed)
CALLED NO ANSWER MAILBOX FULL

## 2021-07-27 NOTE — Telephone Encounter (Signed)
Please try reaching pt and then mail letter  Appears [facility] has tried to call him as well

## 2021-07-29 ENCOUNTER — Other Ambulatory Visit: Payer: Self-pay

## 2021-07-29 DIAGNOSIS — Z860100 Personal history of colon polyps, unspecified: Secondary | ICD-10-CM

## 2021-07-29 MED ORDER — NA SULFATE-K SULFATE-MG SULF 17.5-3.13-1.6 GM/177ML PO SOLN: 1.0000 | Freq: Once | ORAL | 0 refills | Status: AC

## 2021-07-29 NOTE — Progress Notes (Signed)
Gastroenterology Pre-Procedure Review  Request Date: 08/23/2021 Requesting Physician: Dr. Marius Ditch  PATIENT REVIEW QUESTIONS: The patient responded to the following health history questions as indicated:    1. Are you having any GI issues? no 2. Do you have a personal history of Polyps? no 3. Do you have a family history of Colon Cancer or Polyps? no 4. Diabetes Mellitus? no 5. Joint replacements in the past 12 months?no 6. Major health problems in the past 3 months?no 7. Any artificial heart valves, MVP, or defibrillator?does have defibulater    MEDICATIONS & ALLERGIES:    Patient reports the following regarding taking any anticoagulation/antiplatelet therapy:   Plavix, Coumadin, Eliquis, Xarelto, Lovenox, Pradaxa, Brilinta, or Effient? no Aspirin? yes (81 mg)  Patient confirms/reports the following medications:  Current Outpatient Medications  Medication Sig Dispense Refill   acetaminophen (TYLENOL) 325 MG tablet Take 2 tablets (650 mg total) by mouth every 6 (six) hours as needed for fever.     albuterol (VENTOLIN HFA) 108 (90 Base) MCG/ACT inhaler TAKE 2 PUFFS BY MOUTH EVERY 6 HOURS AS NEEDED FOR WHEEZE OR SHORTNESS OF BREATH 8.5 each 1   aspirin EC 81 MG tablet Take 1 tablet (81 mg total) by mouth daily.     benzonatate (TESSALON) 100 MG capsule Take 1 capsule (100 mg total) by mouth 3 (three) times daily as needed for cough. 20 capsule 1   budesonide-formoterol (SYMBICORT) 160-4.5 MCG/ACT inhaler TAKE 2 PUFFS BY MOUTH TWICE A DAY 10.2 each 1   carvedilol (COREG) 12.5 MG tablet Take 2 tablets (25 mg total) by mouth 2 (two) times daily. 60 tablet 3   ENTRESTO 97-103 MG TAKE 1 TABLET BY MOUTH TWICE A DAY 60 tablet 6   FARXIGA 10 MG TABS tablet TAKE 1 TABLET BY MOUTH EVERY DAY BEFORE BREAKFAST 30 tablet 2   feeding supplement (ENSURE ENLIVE / ENSURE PLUS) LIQD Take 237 mLs by mouth 3 (three) times daily between meals.     furosemide (LASIX) 40 MG tablet Take 1 tablet (40 mg total) by  mouth daily. 30 tablet 0   Multiple Vitamin (MULTIVITAMIN WITH MINERALS) TABS tablet Take 1 tablet by mouth daily.     rosuvastatin (CRESTOR) 10 MG tablet TAKE 1 TABLET BY MOUTH EVERY DAY 90 tablet 3   sildenafil (VIAGRA) 100 MG tablet TAKE 1 TABLET BY MOUTH 1 HOUR PRIOR TO INTERCOURSE 30 tablet 1   tamsulosin (FLOMAX) 0.4 MG CAPS capsule Take 1 capsule (0.4 mg total) by mouth daily. 30 capsule 11   triamcinolone (KENALOG) 0.025 % cream Apply 1 application. topically 2 (two) times daily. Do not use longer than 7 days due to skin discoloration 80 g 1   No current facility-administered medications for this visit.    Patient confirms/reports the following allergies:  Allergies  Allergen Reactions   Penicillins Hives    Has patient had a PCN reaction causing immediate rash, facial/tongue/throat swelling, SOB or lightheadedness with hypotension: Yes Has patient had a PCN reaction causing severe rash involving mucus membranes or skin necrosis: No Has patient had a PCN reaction that required hospitalization: No Has patient had a PCN reaction occurring within the last 10 years: No If all of the above answers are "NO", then may proceed with Cephalosporin use.    No orders of the defined types were placed in this encounter.   AUTHORIZATION INFORMATION Primary Insurance: 1D#: Group #:  Secondary Insurance: 1D#: Group #:  SCHEDULE INFORMATION: Date: 08/23/2021 Time: Location:[facility]

## 2021-07-30 NOTE — Progress Notes (Signed)
Resent clearance

## 2021-08-03 ENCOUNTER — Encounter: Payer: Self-pay | Admitting: Family

## 2021-08-04 ENCOUNTER — Ambulatory Visit: Payer: Medicare Other | Admitting: Family

## 2021-08-11 ENCOUNTER — Telehealth: Payer: Self-pay

## 2021-08-11 ENCOUNTER — Encounter: Payer: Self-pay | Admitting: Family

## 2021-08-11 ENCOUNTER — Ambulatory Visit (INDEPENDENT_AMBULATORY_CARE_PROVIDER_SITE_OTHER): Payer: Medicare Other | Admitting: Family

## 2021-08-11 VITALS — BP 132/82 | HR 85 | Temp 96.7°F | Ht 75.0 in | Wt 175.8 lb

## 2021-08-11 DIAGNOSIS — I5022 Chronic systolic (congestive) heart failure: Secondary | ICD-10-CM

## 2021-08-11 DIAGNOSIS — B354 Tinea corporis: Secondary | ICD-10-CM | POA: Diagnosis not present

## 2021-08-11 DIAGNOSIS — J42 Unspecified chronic bronchitis: Secondary | ICD-10-CM

## 2021-08-11 MED ORDER — NA SULFATE-K SULFATE-MG SULF 17.5-3.13-1.6 GM/177ML PO SOLN: 1.0000 | Freq: Once | ORAL | 0 refills | Status: AC

## 2021-08-11 MED ORDER — CLOTRIMAZOLE 1 % EX CREA: 1.0000 "application " | TOPICAL_CREAM | Freq: Two times a day (BID) | CUTANEOUS | 1 refills | Status: DC

## 2021-08-11 NOTE — Progress Notes (Signed)
Subjective:    Patient ID: Jonathon Rios, male    DOB: [birth year], 71 y.o.   MRN: [id number]  CC: Jonathon Rios is a 71 y.o. male who presents today for follow up.   HPI: Feels well today  Rash on left cheek has improved. He continues to describe area as lightened area. He has been using triamcinolone cream daily.     HFrEF- Compliant with lasix.  No sob, cp, wheezing, leg swelling  COPD- compliant with symbicort  Colonoscopy is scheduled HISTORY:  Past Medical History:  Diagnosis Date   Alcoholism and drug addiction in family    Arthritis    Chronic HFrEF (heart failure with reduced ejection fraction) ([place])    a. 12/2016 Echo: EF 25-30%; b. 09/2020 Echo: EF 25-30%, glob HK, GrI DD, mod reduced RV fxn. ao root 45m.   CKD (chronic kidney disease), stage III ([place]    Hypertension    Irregular heart beat    NICM (nonischemic cardiomyopathy) ([place]    a. 12/2016 Echo: EF 25-30%; b. 07/2020 Echo: EF 25-30%; c. 09/2020 s/p BSX Emblem MRI S-ICD A219/166520.   Nonobstructive Coronary artery disease    a. 07/2020 Cath: LM nl, LAD min irregs, D1/2 nl, LCX nl,OM1 mild dzs, RCA min irregs, RPDA/RPAV nl.   Positive TB test    Seizure ([place]    witnessed by family in restaurant   Past Surgical History:  Procedure Laterality Date   CARDIAC CATHETERIZATION     RIGHT/LEFT HEART CATH AND CORONARY ANGIOGRAPHY N/A 07/30/2020   Procedure: RIGHT/LEFT HEART CATH AND CORONARY ANGIOGRAPHY poss PCI;  Surgeon: [name] MD;  [facility];  Service: Cardiovascular;  Laterality: N/A;   SUBQ ICD IMPLANT N/A 10/15/2020   Procedure: SUBQ ICD IMPLANT;  Surgeon: [name] MD;  [facility];  Service: Cardiovascular;  Laterality: N/A;   TONSILLECTOMY     Family History  Problem Relation Age of Onset   Hypertension Mother    Diabetes Mother    Alcohol abuse Mother    Hyperlipidemia Mother    Hypertension Sister    Alcohol abuse Sister    Asthma  Sister    Depression Sister    Drug abuse Sister    Alcohol abuse Father    Hypertension Father    Hypertension Daughter    Early death Daughter    Drug abuse Daughter    Depression Daughter    Alcohol abuse Daughter     Allergies: Penicillins Current Outpatient Medications on File Prior to Visit  Medication Sig Dispense Refill   acetaminophen (TYLENOL) 325 MG tablet Take 2 tablets (650 mg total) by mouth every 6 (six) hours as needed for fever.     albuterol (VENTOLIN HFA) 108 (90 Base) MCG/ACT inhaler TAKE 2 PUFFS BY MOUTH EVERY 6 HOURS AS NEEDED FOR WHEEZE OR SHORTNESS OF BREATH 8.5 each 1   aspirin EC 81 MG tablet Take 1 tablet (81 mg total) by mouth daily.     benzonatate (TESSALON) 100 MG capsule Take 1 capsule (100 mg total) by mouth 3 (three) times daily as needed for cough. 20 capsule 1   budesonide-formoterol (SYMBICORT) 160-4.5 MCG/ACT inhaler TAKE 2 PUFFS BY MOUTH TWICE A DAY 10.2 each 1   ENTRESTO 97-103 MG TAKE 1 TABLET BY MOUTH TWICE A DAY 60 tablet 6   FARXIGA 10 MG TABS tablet TAKE 1 TABLET BY MOUTH EVERY DAY BEFORE BREAKFAST 30 tablet 2   feeding supplement (  ENSURE ENLIVE / ENSURE PLUS) LIQD Take 237 mLs by mouth 3 (three) times daily between meals.     Multiple Vitamin (MULTIVITAMIN WITH MINERALS) TABS tablet Take 1 tablet by mouth daily.     rosuvastatin (CRESTOR) 10 MG tablet TAKE 1 TABLET BY MOUTH EVERY DAY 90 tablet 3   sildenafil (VIAGRA) 100 MG tablet TAKE 1 TABLET BY MOUTH 1 HOUR PRIOR TO INTERCOURSE 30 tablet 1   tamsulosin (FLOMAX) 0.4 MG CAPS capsule Take 1 capsule (0.4 mg total) by mouth daily. 30 capsule 11   triamcinolone (KENALOG) 0.025 % cream Apply 1 application. topically 2 (two) times daily. Do not use longer than 7 days due to skin discoloration 80 g 1   carvedilol (COREG) 12.5 MG tablet Take 2 tablets (25 mg total) by mouth 2 (two) times daily. 60 tablet 3   furosemide (LASIX) 40 MG tablet Take 1 tablet (40 mg total) by mouth daily. 30 tablet 0    No current facility-administered medications on file prior to visit.    Social History   Tobacco Use   Smoking status: Some Days    Packs/day: 0.25    Years: 50.00    Total pack years: 12.50    Types: Cigarettes    Last attempt to quit: 02/23/2017    Years since quitting: 4.4   Smokeless tobacco: Never  Vaping Use   Vaping Use: Never used  Substance Use Topics   Alcohol use: No   Drug use: Not Currently    Types: "Crack" cocaine, Heroin, Marijuana, LSD    Comment: Sober since 2018.     Review of Systems  Constitutional:  Negative for chills and fever.  Respiratory:  Negative for cough and shortness of breath.   Cardiovascular:  Negative for chest pain, palpitations and leg swelling.  Gastrointestinal:  Negative for nausea and vomiting.  Skin:  Positive for rash.      Objective:    BP 132/82 (BP Location: Left Arm, Patient Position: Sitting, Cuff Size: Normal)   Pulse 85   Temp (!) 96.7 F (35.9 C) (Oral)   Ht '6\' 3"'$  (1.905 m)   Wt 175 lb 12.8 oz (79.7 kg)   SpO2 99%   BMI 21.97 kg/m  BP Readings from Last 3 Encounters:  08/11/21 132/82  07/12/21 126/76  06/25/21 110/90   Wt Readings from Last 3 Encounters:  08/11/21 175 lb 12.8 oz (79.7 kg)  07/12/21 180 lb 9.6 oz (81.9 kg)  07/06/21 177 lb (80.3 kg)    Physical Exam Vitals reviewed.  Constitutional:      Appearance: He is well-developed.  HENT:     Head:      Comments: Area of hypopigmentation. Small nonpurulent papules scattered.  Cardiovascular:     Rate and Rhythm: Regular rhythm.     Heart sounds: Normal heart sounds.  Pulmonary:     Effort: Pulmonary effort is normal. No respiratory distress.     Breath sounds: Normal breath sounds. No wheezing, rhonchi or rales.  Skin:    General: Skin is warm and dry.  Neurological:     Mental Status: He is alert.  Psychiatric:        Speech: Speech normal.        Behavior: Behavior normal.        Assessment & Plan:   Problem List Items  Addressed This Visit       Cardiovascular and Mediastinum   Chronic HFrEF (heart failure with reduced ejection fraction) ([place])  Symptomatically stable. Continue lasix '40mg'$  QD, coreg 12.'5mg'$  BID        Respiratory   COPD (chronic obstructive pulmonary disease) ([place])    Well appearing today. No adventitious lung sounds. Continue symbicort 160-4.'5mg'$ .         Musculoskeletal and Integument   Tinea corporis - Primary    Continue to question tinea. Advised to stop triamcinolone due to concern that it could cause further hypopigmentation. Trial of clotrimazole. He has dermatology appointment planned.       Relevant Medications   clotrimazole (LOTRIMIN) 1 % cream     I am having Jonathon Rios start on clotrimazole. I am also having him maintain his aspirin EC, feeding supplement, multivitamin with minerals, acetaminophen, carvedilol, rosuvastatin, Entresto, tamsulosin, sildenafil, benzonatate, furosemide, albuterol, budesonide-formoterol, Farxiga, and triamcinolone.   Meds ordered this encounter  Medications   clotrimazole (LOTRIMIN) 1 % cream    Sig: Apply 1 application  topically 2 (two) times daily.    Dispense:  30 g    Refill:  1    Order Specific Question:   Supervising Provider    Answer:   Crecencio Mc [[id number]]    Return precautions given.   Risks, benefits, and alternatives of the medications and treatment plan prescribed today were discussed, and patient expressed understanding.   Education regarding symptom management and diagnosis given to patient on AVS.  Continue to follow with [name], FNP for routine health maintenance.   [name] and I agreed with plan.   Mable Paris, FNP

## 2021-08-11 NOTE — Assessment & Plan Note (Signed)
Continue to question tinea. Advised to stop triamcinolone due to concern that it could cause further hypopigmentation. Trial of clotrimazole. He has dermatology appointment planned.

## 2021-08-11 NOTE — Telephone Encounter (Signed)
Sent in prep

## 2021-08-11 NOTE — Patient Instructions (Addendum)
Trial of clotrimazole  Please use triamcinolone ( steroid) cream sparingly as it can cause skin lightening as well   Nice to see you!

## 2021-08-11 NOTE — Assessment & Plan Note (Signed)
Symptomatically stable. Continue lasix '40mg'$  QD, coreg 12.'5mg'$  BID

## 2021-08-11 NOTE — Assessment & Plan Note (Signed)
Well appearing today. No adventitious lung sounds. Continue symbicort 160-4.'5mg'$ .

## 2021-08-16 ENCOUNTER — Telehealth: Payer: Self-pay

## 2021-08-16 ENCOUNTER — Telehealth: Payer: Self-pay | Admitting: Cardiovascular Disease

## 2021-08-16 MED ORDER — NA SULFATE-K SULFATE-MG SULF 17.5-3.13-1.6 GM/177ML PO SOLN: 1.0000 | Freq: Once | ORAL | 0 refills | Status: AC

## 2021-08-16 NOTE — Telephone Encounter (Signed)
Resent in blood thinner clearance

## 2021-08-16 NOTE — Telephone Encounter (Signed)
   Pre-operative Risk Assessment    Patient Name: Jonathon Rios  DOB: 11-24-1950 MRN: [id number]      Request for Surgical Clearance    Procedure:   colonoscopy  Date of Surgery:  Clearance 08/23/21                                 Surgeon:  not indicated    Surgeon's Group or Practice Name:  [facility] Phone number:  [phone]  Fax number:  [phone]   Type of Clearance Requested:   - Medical  - Pharmacy:  Hold not indicated   please advise    Type of Anesthesia:  General    Additional requests/questions:    Jonathon Jordan   08/16/2021, 9:52 AM

## 2021-08-16 NOTE — Telephone Encounter (Signed)
Called patient he understands his clearance and he is good to go for procedure

## 2021-08-16 NOTE — Telephone Encounter (Signed)
   Primary Cardiologist: [name], MD  Chart reviewed as part of pre-operative protocol coverage. Given past medical history and time since last visit, based on ACC/AHA guidelines, Jonathon Rios would be at acceptable risk for the planned procedure without further cardiovascular testing.   If needed his aspirin may be held for 5 days prior to his procedure.  Please resume as soon as hemostasis is achieved.  I will route this recommendation to the requesting party via Epic fax function and remove from pre-op pool.  Please call with questions.  Jossie Ng. Ayvin Lipinski NP-C    08/16/2021, 10:36 AM [address] [place] [address] [phone] Fax [phone]

## 2021-08-20 ENCOUNTER — Encounter: Payer: Self-pay | Admitting: Gastroenterology

## 2021-08-23 ENCOUNTER — Ambulatory Visit: Payer: Medicare Other | Admitting: Anesthesiology

## 2021-08-23 ENCOUNTER — Encounter: Payer: Self-pay | Admitting: Gastroenterology

## 2021-08-23 ENCOUNTER — Ambulatory Visit
Admission: RE | Admit: 2021-08-23 | Discharge: 2021-08-23 | Disposition: A | Discharge status: Home / Self Care | Payer: Medicare Other | Attending: Gastroenterology | Admitting: Gastroenterology

## 2021-08-23 ENCOUNTER — Encounter
Admission: RE | Disposition: A | Discharge status: Home / Self Care | Payer: Self-pay | Source: Home / Self Care | Attending: Gastroenterology

## 2021-08-23 DIAGNOSIS — Z86010 Personal history of colonic polyps: Secondary | ICD-10-CM | POA: Diagnosis not present

## 2021-08-23 DIAGNOSIS — F17200 Nicotine dependence, unspecified, uncomplicated: Secondary | ICD-10-CM | POA: Diagnosis not present

## 2021-08-23 DIAGNOSIS — I5022 Chronic systolic (congestive) heart failure: Secondary | ICD-10-CM | POA: Diagnosis not present

## 2021-08-23 DIAGNOSIS — Z7951 Long term (current) use of inhaled steroids: Secondary | ICD-10-CM | POA: Insufficient documentation

## 2021-08-23 DIAGNOSIS — Z1211 Encounter for screening for malignant neoplasm of colon: Secondary | ICD-10-CM | POA: Insufficient documentation

## 2021-08-23 DIAGNOSIS — I2510 Atherosclerotic heart disease of native coronary artery without angina pectoris: Secondary | ICD-10-CM | POA: Diagnosis not present

## 2021-08-23 DIAGNOSIS — Z7984 Long term (current) use of oral hypoglycemic drugs: Secondary | ICD-10-CM | POA: Diagnosis not present

## 2021-08-23 DIAGNOSIS — R195 Other fecal abnormalities: Secondary | ICD-10-CM | POA: Diagnosis not present

## 2021-08-23 DIAGNOSIS — K644 Residual hemorrhoidal skin tags: Secondary | ICD-10-CM | POA: Diagnosis not present

## 2021-08-23 DIAGNOSIS — I110 Hypertensive heart disease with heart failure: Secondary | ICD-10-CM | POA: Insufficient documentation

## 2021-08-23 DIAGNOSIS — M1990 Unspecified osteoarthritis, unspecified site: Secondary | ICD-10-CM | POA: Diagnosis not present

## 2021-08-23 HISTORY — PX: COLONOSCOPY WITH PROPOFOL: SHX5780

## 2021-08-23 SURGERY — COLONOSCOPY WITH PROPOFOL
Anesthesia: General

## 2021-08-23 MED ORDER — SODIUM CHLORIDE 0.9 % IV SOLN: INTRAVENOUS | Status: DC | PRN

## 2021-08-23 MED ORDER — LIDOCAINE 2% (20 MG/ML) 5 ML SYRINGE
INTRAMUSCULAR | Status: DC | PRN
  Administered 2021-08-23: 50 mg via INTRAVENOUS

## 2021-08-23 MED ORDER — EPHEDRINE SULFATE (PRESSORS) 50 MG/ML IJ SOLN
INTRAMUSCULAR | Status: DC | PRN
  Administered 2021-08-23: 10 mg via INTRAVENOUS

## 2021-08-23 MED ORDER — PROPOFOL 10 MG/ML IV BOLUS
INTRAVENOUS | Status: DC | PRN
  Administered 2021-08-23: 80 mg via INTRAVENOUS
  Administered 2021-08-23: 20 mg via INTRAVENOUS

## 2021-08-23 MED ORDER — PROPOFOL 500 MG/50ML IV EMUL
INTRAVENOUS | Status: DC | PRN
  Administered 2021-08-23: 150 ug/kg/min via INTRAVENOUS

## 2021-08-23 MED ORDER — PROPOFOL 1000 MG/100ML IV EMUL
INTRAVENOUS | Status: AC
  Filled 2021-08-23: qty 100

## 2021-08-23 NOTE — Anesthesia Postprocedure Evaluation (Signed)
Anesthesia Post Note  Patient: Jonathon Rios  Procedure(s) Performed: COLONOSCOPY WITH PROPOFOL  Patient location during evaluation: Endoscopy Anesthesia Type: General Level of consciousness: awake and alert Pain management: pain level controlled Vital Signs Assessment: post-procedure vital signs reviewed and stable Respiratory status: spontaneous breathing, nonlabored ventilation, respiratory function stable and patient connected to nasal cannula oxygen Cardiovascular status: blood pressure returned to baseline and stable Postop Assessment: no apparent nausea or vomiting Anesthetic complications: no   No notable events documented.   Last Vitals:  Vitals:   08/23/21 1007 08/23/21 1017  BP: (!) 131/105 (!) 154/109  Pulse: 67 67  Resp: 20 (!) 21  Temp:    SpO2: 100% 97%    Last Pain:  Vitals:   08/23/21 1017  TempSrc:   PainSc: 0-No pain                 Cleda Mccreedy Yoshi Mancillas

## 2021-08-24 ENCOUNTER — Encounter: Payer: Self-pay | Admitting: Gastroenterology

## 2021-08-27 ENCOUNTER — Other Ambulatory Visit: Payer: Self-pay | Admitting: Physician Assistant

## 2021-08-27 ENCOUNTER — Telehealth: Payer: Self-pay | Admitting: Acute Care

## 2021-08-27 NOTE — Telephone Encounter (Signed)
Reached pt to schedule annual LDCT- pt was not interested in having one.

## 2021-09-01 NOTE — Telephone Encounter (Signed)
Called the patient to verify how he is taking his Carvedilol 12.5 mg bid vs. 25 mg bid. Lmtcb.

## 2021-09-08 ENCOUNTER — Other Ambulatory Visit: Payer: Self-pay | Admitting: Family

## 2021-09-08 DIAGNOSIS — R809 Proteinuria, unspecified: Secondary | ICD-10-CM | POA: Diagnosis not present

## 2021-09-08 DIAGNOSIS — N2581 Secondary hyperparathyroidism of renal origin: Secondary | ICD-10-CM | POA: Diagnosis not present

## 2021-09-08 DIAGNOSIS — J449 Chronic obstructive pulmonary disease, unspecified: Secondary | ICD-10-CM

## 2021-09-08 DIAGNOSIS — N1832 Chronic kidney disease, stage 3b: Secondary | ICD-10-CM | POA: Diagnosis not present

## 2021-09-08 DIAGNOSIS — I509 Heart failure, unspecified: Secondary | ICD-10-CM | POA: Diagnosis not present

## 2021-09-08 DIAGNOSIS — D631 Anemia in chronic kidney disease: Secondary | ICD-10-CM | POA: Diagnosis not present

## 2021-09-08 DIAGNOSIS — N281 Cyst of kidney, acquired: Secondary | ICD-10-CM | POA: Diagnosis not present

## 2021-09-08 DIAGNOSIS — I10 Essential (primary) hypertension: Secondary | ICD-10-CM | POA: Diagnosis not present

## 2021-09-08 DIAGNOSIS — Z95810 Presence of automatic (implantable) cardiac defibrillator: Secondary | ICD-10-CM | POA: Diagnosis not present

## 2021-09-08 NOTE — Telephone Encounter (Signed)
Refills sent to pharmacy. 

## 2021-09-08 NOTE — Telephone Encounter (Signed)
2nd attempt to contact the pt. Lmtcb.

## 2021-10-02 ENCOUNTER — Other Ambulatory Visit: Payer: Self-pay | Admitting: Physician Assistant

## 2021-10-02 ENCOUNTER — Other Ambulatory Visit: Payer: Self-pay | Admitting: Family

## 2021-10-02 ENCOUNTER — Other Ambulatory Visit: Payer: Self-pay | Admitting: Cardiovascular Disease

## 2021-10-04 ENCOUNTER — Telehealth: Payer: Self-pay

## 2021-10-04 ENCOUNTER — Other Ambulatory Visit: Payer: Self-pay

## 2021-10-04 MED ORDER — ENTRESTO 97-103 MG PO TABS: 1.0000 | ORAL_TABLET | Freq: Two times a day (BID) | ORAL | 2 refills | Status: DC

## 2021-10-04 MED ORDER — CARVEDILOL 12.5 MG PO TABS: 12.5000 mg | ORAL_TABLET | Freq: Two times a day (BID) | ORAL | 0 refills | Status: DC

## 2021-10-04 MED ORDER — DAPAGLIFLOZIN PROPANEDIOL 10 MG PO TABS: ORAL_TABLET | ORAL | 3 refills | Status: DC

## 2021-10-04 MED ORDER — ROSUVASTATIN CALCIUM 10 MG PO TABS: 10.0000 mg | ORAL_TABLET | Freq: Every day | ORAL | 3 refills | Status: DC

## 2021-10-04 NOTE — Telephone Encounter (Signed)
Patient is requesting a 90 day refills on all  FARXIGA 10 MG TABS tablet rosuvastatin (CRESTOR) 10 MG tablet carvedilol (COREG) 12.5 MG tablet ENTRESTO 97-103 MG

## 2021-10-04 NOTE — Telephone Encounter (Signed)
Spoke to patient .Marland KitchenMarland Kitchenrefills sent into pharmacy

## 2021-10-13 ENCOUNTER — Other Ambulatory Visit: Payer: Self-pay | Admitting: Family

## 2021-10-13 DIAGNOSIS — B354 Tinea corporis: Secondary | ICD-10-CM

## 2021-10-14 DIAGNOSIS — Z720 Tobacco use: Secondary | ICD-10-CM | POA: Diagnosis not present

## 2021-10-14 DIAGNOSIS — I5021 Acute systolic (congestive) heart failure: Secondary | ICD-10-CM | POA: Diagnosis not present

## 2021-10-14 DIAGNOSIS — I5023 Acute on chronic systolic (congestive) heart failure: Secondary | ICD-10-CM | POA: Diagnosis not present

## 2021-10-14 DIAGNOSIS — Z9889 Other specified postprocedural states: Secondary | ICD-10-CM | POA: Diagnosis not present

## 2021-10-25 ENCOUNTER — Ambulatory Visit (INDEPENDENT_AMBULATORY_CARE_PROVIDER_SITE_OTHER): Payer: Medicare Other

## 2021-10-25 DIAGNOSIS — I428 Other cardiomyopathies: Secondary | ICD-10-CM

## 2021-10-25 DIAGNOSIS — I5022 Chronic systolic (congestive) heart failure: Secondary | ICD-10-CM

## 2021-10-27 LAB — CUP PACEART REMOTE DEVICE CHECK
Battery Remaining Percentage: 89 %
Date Time Interrogation Session: 20230825150100
Implantable Lead Implant Date: 20220818
Implantable Lead Location: 753859
Implantable Lead Model: 3501
Implantable Lead Serial Number: 218222
Implantable Pulse Generator Implant Date: 20220818
Pulse Gen Serial Number: 166520

## 2021-11-02 ENCOUNTER — Encounter: Payer: Self-pay | Admitting: Cardiovascular Disease

## 2021-11-02 ENCOUNTER — Ambulatory Visit: Payer: Medicare Other | Attending: Cardiovascular Disease | Admitting: Cardiovascular Disease

## 2021-11-02 VITALS — BP 140/110 | HR 77 | Ht 74.5 in | Wt 180.1 lb

## 2021-11-02 DIAGNOSIS — I5022 Chronic systolic (congestive) heart failure: Secondary | ICD-10-CM

## 2021-11-02 DIAGNOSIS — I10 Essential (primary) hypertension: Secondary | ICD-10-CM

## 2021-11-02 DIAGNOSIS — Z720 Tobacco use: Secondary | ICD-10-CM

## 2021-11-02 DIAGNOSIS — N5201 Erectile dysfunction due to arterial insufficiency: Secondary | ICD-10-CM | POA: Diagnosis not present

## 2021-11-02 MED ORDER — SILDENAFIL CITRATE 100 MG PO TABS: 100.0000 mg | ORAL_TABLET | ORAL | 2 refills | Status: DC | PRN

## 2021-11-02 MED ORDER — CARVEDILOL 25 MG PO TABS: 25.0000 mg | ORAL_TABLET | Freq: Two times a day (BID) | ORAL | 2 refills | Status: DC

## 2021-11-02 NOTE — Patient Instructions (Signed)
Medication Instructions:  Your physician has recommended you make the following change in your medication:   INCREASE Carvedilol to 25 mg twice a day. An Rx has been sent to your pharmacy.  *If you need a refill on your cardiac medications before your next appointment, please call your pharmacy*   Lab Work: None ordered If you have labs (blood work) drawn today and your tests are completely normal, you will receive your results only by: [place] (if you have MyChart) OR A paper copy in the mail If you have any lab test that is abnormal or we need to change your treatment, we will call you to review the results.   Testing/Procedures: None ordered   Follow-Up: At [facility], you and your health needs are our priority.  As part of our continuing mission to provide you with exceptional heart care, we have created designated Provider Care Teams.  These Care Teams include your primary Cardiologist (physician) and Advanced Practice Providers (APPs -  Physician Assistants and Nurse Practitioners) who all work together to provide you with the care you need, when you need it.  We recommend signing up for the patient portal called "MyChart".  Sign up information is provided on this After Visit Summary.  MyChart is used to connect with patients for Virtual Visits (Telemedicine).  Patients are able to view lab/test results, encounter notes, upcoming appointments, etc.  Non-urgent messages can be sent to your provider as well.   To learn more about what you can do with MyChart, go to [email or web address].    Your next appointment:   4 month(s)  The format for your next appointment:   In Person  Provider:   You may see [name], MD or one of the following Advanced Practice Providers on your designated Care Team:   Murray Hodgkins, NP [name], PA-C Cadence Kathlen Mody, PA-C Gerrie Nordmann, NP   Other Instructions N/A  Important Information About Sugar
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
## 2021-11-04 NOTE — Addendum Note (Signed)
Addended by: Britt Bottom on: 11/04/2021 04:31 PM   Modules accepted: Orders

## 2021-11-08 ENCOUNTER — Other Ambulatory Visit: Payer: Self-pay | Admitting: *Deleted

## 2021-11-08 DIAGNOSIS — I5022 Chronic systolic (congestive) heart failure: Secondary | ICD-10-CM

## 2021-11-11 IMAGING — CR DG CHEST 2V
2 series · 4 of 4 positions shown · non-contrast
Comparison: October 16, 2020

CLINICAL DATA: sob

EXAM:
CHEST - 2 VIEW

[Series 2: chest lat · 0.14mm/px · 2 of 2 slices shown]
[im 1/2]
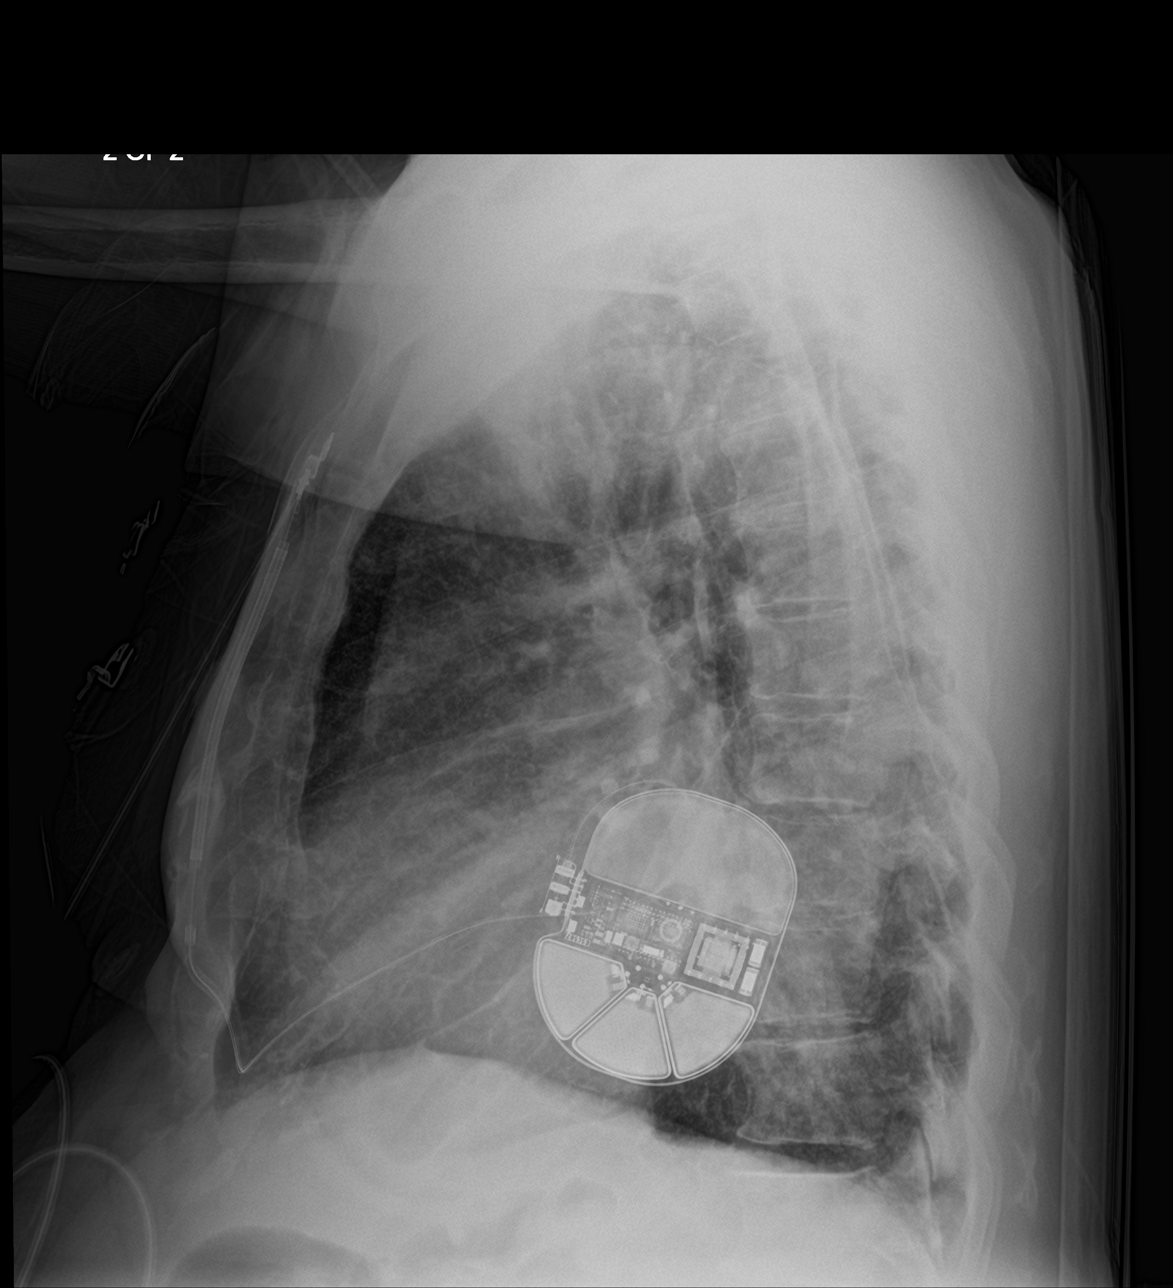
[im 2/2]
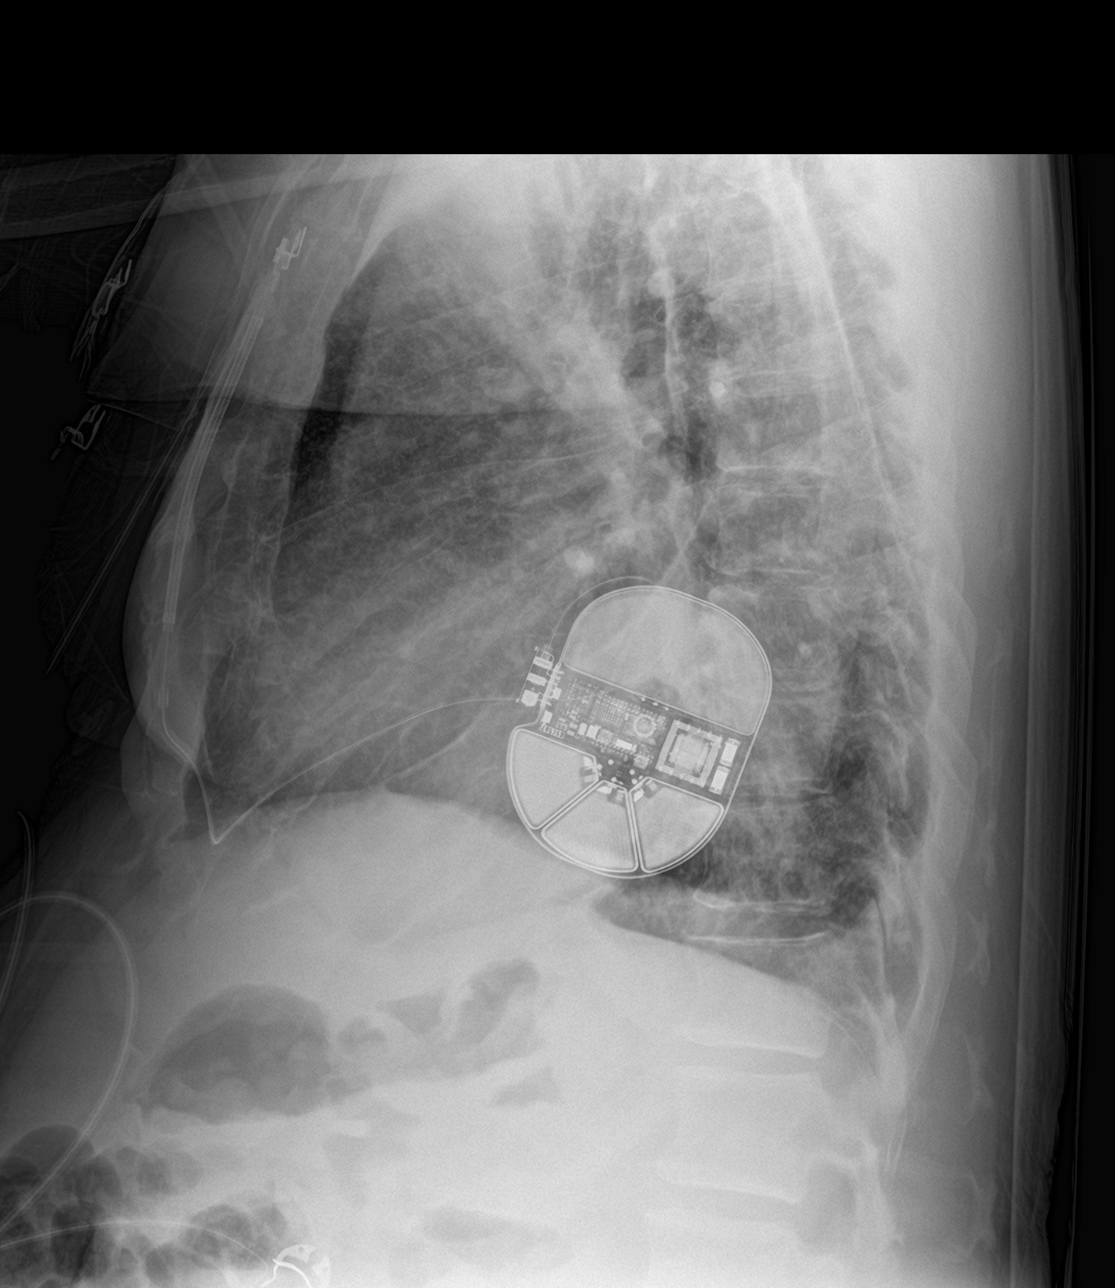

[Series 3: chest ap · 0.14mm/px · 2 of 2 slices shown]
[im 1/2]
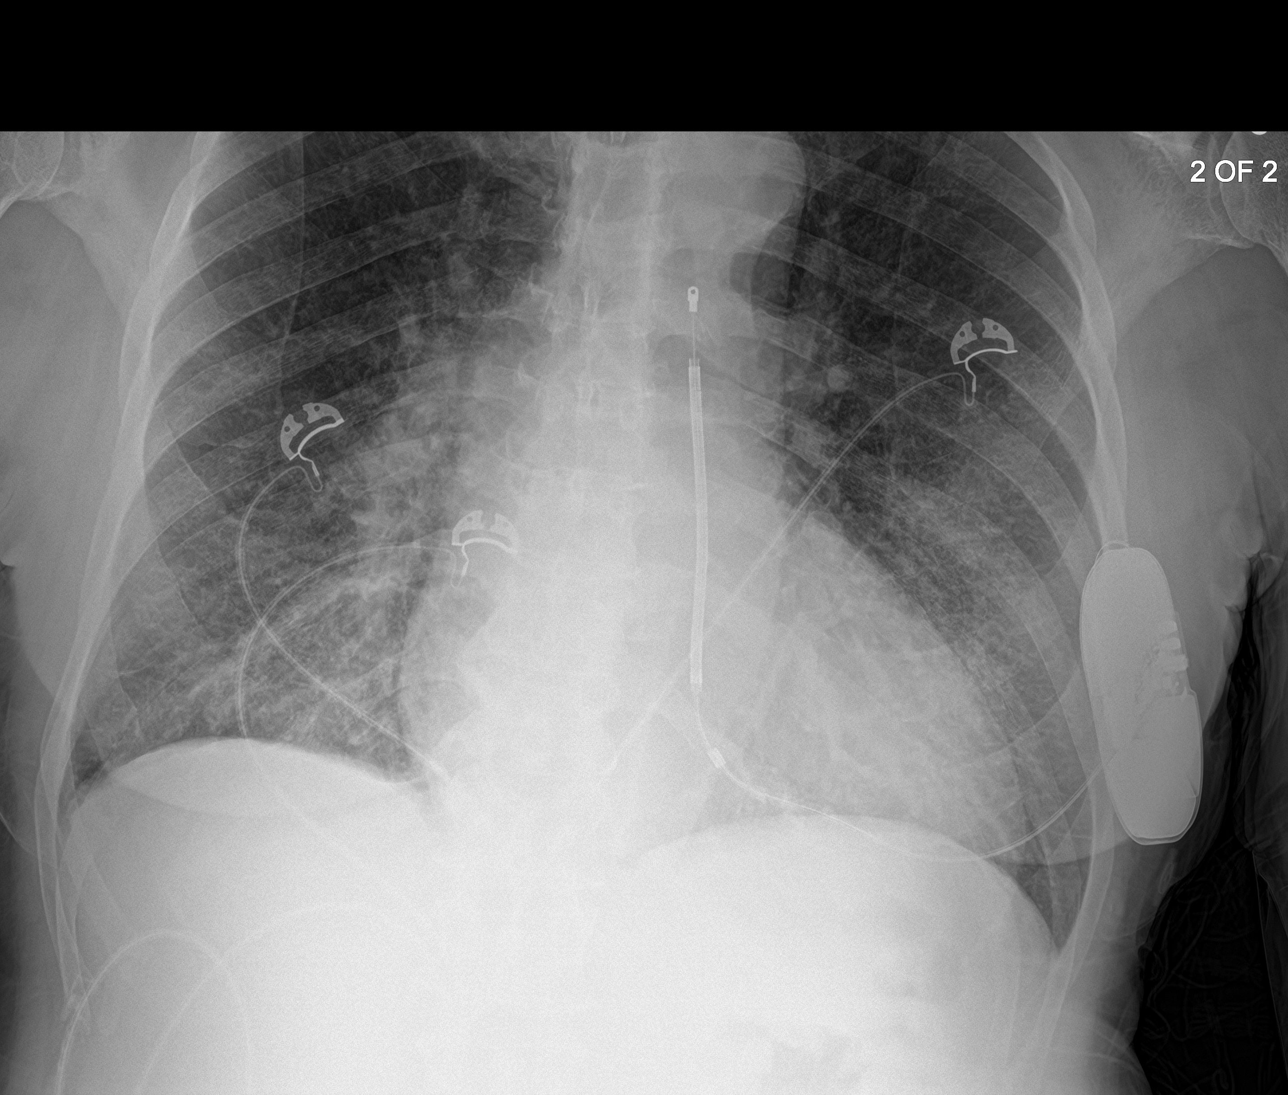
[im 2/2]
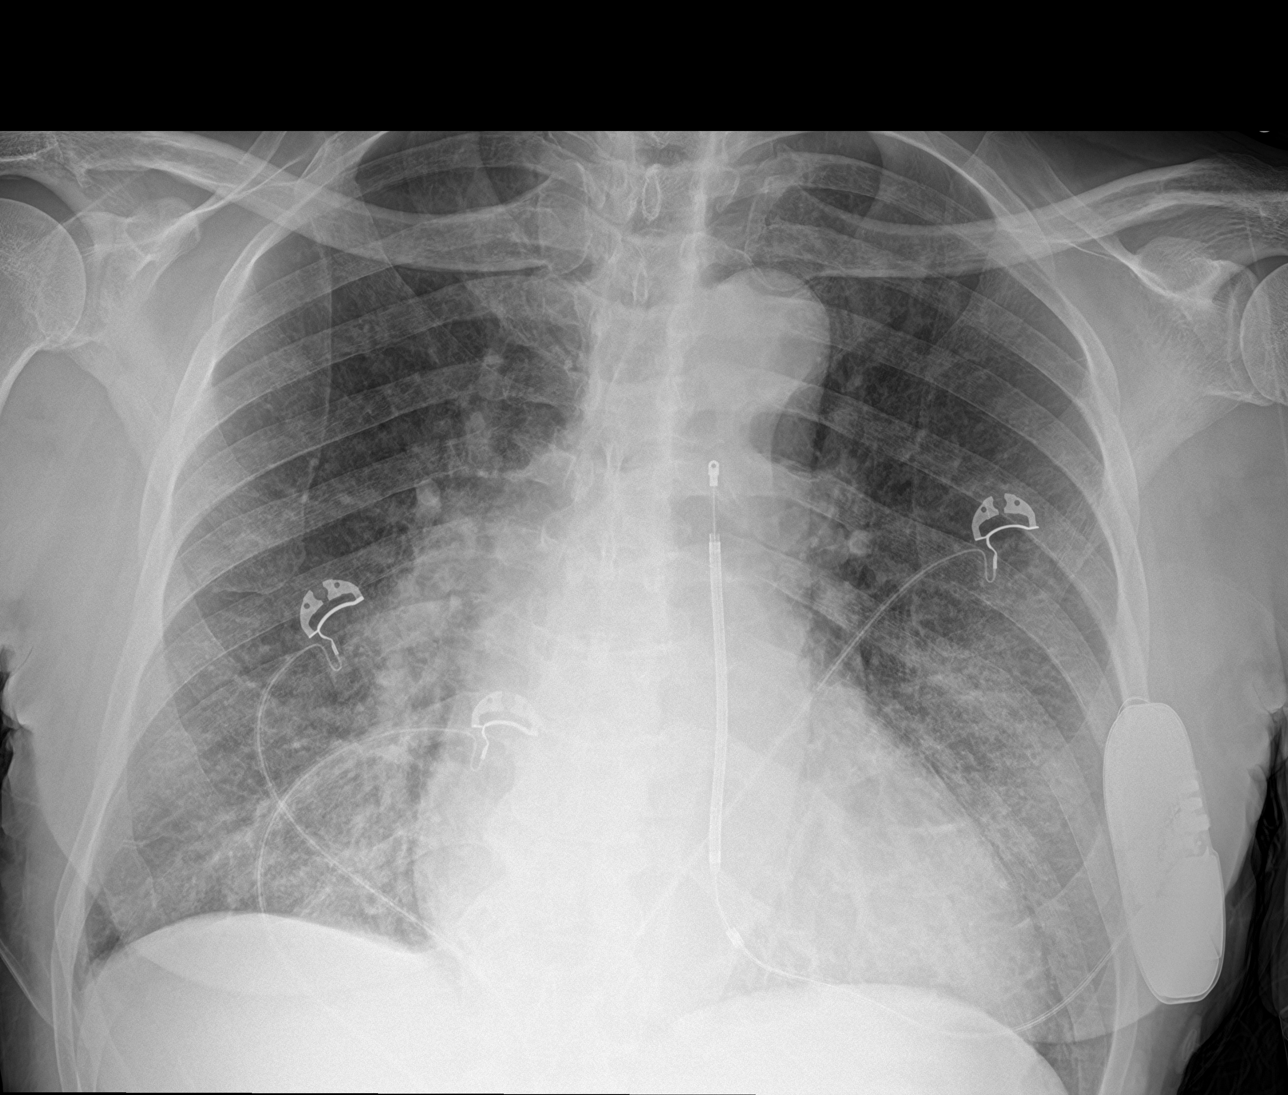

[4 of 4 positions shown; findings below may reference images not displayed]

FINDINGS: The cardiomediastinal silhouette is unchanged and enlarged in
contour.LEFT chest AICD. No pleural effusion. No pneumothorax.
Perihilar vascular fullness, peribronchial cuffing and diffuse
interstitial prominence. Visualized abdomen is unremarkable.
Multilevel degenerative changes of the thoracic spine.
IMPRESSION: Constellation of findings are favored to reflect pulmonary edema.
Atypical infection could present similarly.

## 2021-11-19 NOTE — Progress Notes (Signed)
Remote ICD transmission.   

## 2021-11-26 ENCOUNTER — Emergency Department: Payer: Medicare Other

## 2021-11-26 ENCOUNTER — Emergency Department
Admission: EM | Admit: 2021-11-26 | Discharge: 2021-11-26 | Discharge status: Against Medical Advice | Payer: Medicare Other | Attending: Student in an Organized Health Care Education/Training Program | Admitting: Student in an Organized Health Care Education/Training Program

## 2021-11-26 DIAGNOSIS — I509 Heart failure, unspecified: Secondary | ICD-10-CM | POA: Diagnosis not present

## 2021-11-26 DIAGNOSIS — R790 Abnormal level of blood mineral: Secondary | ICD-10-CM | POA: Diagnosis not present

## 2021-11-26 DIAGNOSIS — R0601 Orthopnea: Secondary | ICD-10-CM | POA: Diagnosis not present

## 2021-11-26 DIAGNOSIS — R791 Abnormal coagulation profile: Secondary | ICD-10-CM | POA: Insufficient documentation

## 2021-11-26 DIAGNOSIS — R0602 Shortness of breath: Secondary | ICD-10-CM | POA: Diagnosis not present

## 2021-11-26 DIAGNOSIS — Z79899 Other long term (current) drug therapy: Secondary | ICD-10-CM | POA: Diagnosis not present

## 2021-11-26 DIAGNOSIS — R059 Cough, unspecified: Secondary | ICD-10-CM | POA: Diagnosis not present

## 2021-11-26 LAB — COMPREHENSIVE METABOLIC PANEL
ALT: 12 U/L (ref 0–44)
AST: 13 U/L — ABNORMAL LOW (ref 15–41)
Albumin: 3.6 g/dL (ref 3.5–5.0)
Alkaline Phosphatase: 51 U/L (ref 38–126)
Anion gap: 6 (ref 5–15)
BUN: 32 mg/dL — ABNORMAL HIGH (ref 8–23)
CO2: 28 mmol/L (ref 22–32)
Calcium: 8.8 mg/dL — ABNORMAL LOW (ref 8.9–10.3)
Chloride: 106 mmol/L (ref 98–111)
Creatinine, Ser: 1.91 mg/dL — ABNORMAL HIGH (ref 0.61–1.24)
GFR, Estimated: 37 mL/min — ABNORMAL LOW (ref >60)
Glucose, Bld: 109 mg/dL — ABNORMAL HIGH (ref 70–99)
Potassium: 4.4 mmol/L (ref 3.5–5.1)
Sodium: 140 mmol/L (ref 135–145)
Total Bilirubin: 1.4 mg/dL — ABNORMAL HIGH (ref 0.3–1.2)
Total Protein: 7.5 g/dL (ref 6.5–8.1)

## 2021-11-26 LAB — TROPONIN I (HIGH SENSITIVITY)
Troponin I (High Sensitivity): 17 ng/L (ref <18)
Troponin I (High Sensitivity): 18 ng/L — ABNORMAL HIGH (ref <18)

## 2021-11-26 LAB — CBC
HCT: 42.8 % (ref 39.0–52.0)
Hemoglobin: 13.7 g/dL (ref 13.0–17.0)
MCH: 29.3 pg (ref 26.0–34.0)
MCHC: 32 g/dL (ref 30.0–36.0)
MCV: 91.5 fL (ref 80.0–100.0)
Platelets: 90 10*3/uL — ABNORMAL LOW (ref 150–400)
RBC: 4.68 MIL/uL (ref 4.22–5.81)
RDW: 14.1 % (ref 11.5–15.5)
WBC: 4.4 10*3/uL (ref 4.0–10.5)
nRBC: 0 % (ref 0.0–0.2)

## 2021-11-26 LAB — D-DIMER, QUANTITATIVE: D-Dimer, Quant: 0.73 ug/mL-FEU — ABNORMAL HIGH (ref 0.00–0.50)

## 2021-11-26 LAB — BRAIN NATRIURETIC PEPTIDE: B Natriuretic Peptide: >4500 pg/mL — ABNORMAL HIGH (ref 0.0–100.0)

## 2021-11-26 MED ORDER — IOHEXOL 350 MG/ML SOLN: 60.0000 mL | Freq: Once | INTRAVENOUS | Status: DC | PRN

## 2021-11-26 MED ORDER — IPRATROPIUM-ALBUTEROL 0.5-2.5 (3) MG/3ML IN SOLN
3.0000 mL | Freq: Once | RESPIRATORY_TRACT | Status: AC
  Administered 2021-11-26: 3 mL via RESPIRATORY_TRACT
  Filled 2021-11-26: qty 3

## 2021-11-26 MED ORDER — CARVEDILOL 6.25 MG PO TABS
12.5000 mg | ORAL_TABLET | Freq: Two times a day (BID) | ORAL | Status: DC
  Administered 2021-11-26: 12.5 mg via ORAL
  Filled 2021-11-26: qty 2

## 2021-11-26 MED ORDER — ALBUTEROL SULFATE HFA 108 (90 BASE) MCG/ACT IN AERS: 2.0000 | INHALATION_SPRAY | RESPIRATORY_TRACT | Status: DC | PRN

## 2021-11-26 NOTE — ED Triage Notes (Signed)
Pt sts that he recently stopped smoking. Pt sts that started smoking when he was 43. Pt has been having SOB since than and coughing up "black stuff"

## 2021-11-26 NOTE — ED Provider Notes (Signed)
[facility] Provider Note    Event Date/Time   First MD Initiated Contact with Patient 11/26/21 1158     (approximate)   History   Shortness of Breath   HPI  Jonathon Rios is a 71 y.o. male with a history of bronchitis CHF as well as polysubstance abuse presents to the ER for evaluation of several weeks of worsening shortness of breath.  Is endorsing orthopnea some pain with deep inspiration.  No measured fevers or chills.  Denies any chest pain at this time.  Not any blood thinners.  States has been taking his home Lasix and is still making urine.     Physical Exam   Triage Vital Signs: ED Triage Vitals  Enc Vitals Group     BP 11/26/21 1137 (!) 145/95     Pulse Rate 11/26/21 1137 76     Resp 11/26/21 1137 (!) 22     Temp 11/26/21 1137 98 F (36.7 C)     Temp Source 11/26/21 1137 Oral     SpO2 11/26/21 1137 96 %     Weight 11/26/21 1138 182 lb (82.6 kg)     Height --      Head Circumference --      Peak Flow --      Pain Score 11/26/21 1200 0     Pain Loc --      Pain Edu? --      Excl. in [place]? --     Most recent vital signs: Vitals:   11/26/21 1330 11/26/21 1345  BP: (!) 137/105   Pulse: 70   Resp: 18   Temp:    SpO2: 96% 100%     Constitutional: Alert  Eyes: Conjunctivae are normal.  Head: Atraumatic. Nose: No congestion/rhinnorhea. Mouth/Throat: Mucous membranes are moist.   Neck: Painless ROM.  Cardiovascular:   Good peripheral circulation. Respiratory: Normal respiratory effort.  No retractions.  Gastrointestinal: Soft and nontender.  Musculoskeletal:  no deformity Neurologic:  MAE spontaneously. No gross focal neurologic deficits are appreciated.  Skin:  Skin is warm, dry and intact. No rash noted. Psychiatric: Mood and affect are normal. Speech and behavior are normal.    ED Results / Procedures / Treatments   Labs (all labs ordered are listed, but only abnormal results are displayed) Labs Reviewed  CBC -  Abnormal; Notable for the following components:      Result Value   Platelets 90 (*)    All other components within normal limits  COMPREHENSIVE METABOLIC PANEL - Abnormal; Notable for the following components:   Glucose, Bld 109 (*)    BUN 32 (*)    Creatinine, Ser 1.91 (*)    Calcium 8.8 (*)    AST 13 (*)    Total Bilirubin 1.4 (*)    GFR, Estimated 37 (*)    All other components within normal limits  BRAIN NATRIURETIC PEPTIDE - Abnormal; Notable for the following components:   B Natriuretic Peptide >4,500.0 (*)    All other components within normal limits  D-DIMER, QUANTITATIVE - Abnormal; Notable for the following components:   D-Dimer, Quant 0.73 (*)    All other components within normal limits  TROPONIN I (HIGH SENSITIVITY) - Abnormal; Notable for the following components:   Troponin I (High Sensitivity) 18 (*)    All other components within normal limits  TROPONIN I (HIGH SENSITIVITY)     EKG  ED ECG REPORT I, Merlyn Lot, the attending physician, personally viewed and  interpreted this ECG.   Date: 11/26/2021  EKG Time: 11:35  Rate: 80  Rhythm: sinus  Axis: normal  Intervals: normal  ST&T Change: no stemi, non specific t wave abn    RADIOLOGY Please see ED Course for my review and interpretation.  I personally reviewed all radiographic images ordered to evaluate for the above acute complaints and reviewed radiology reports and findings.  These findings were personally discussed with the patient.  Please see medical record for radiology report.     PROCEDURES:  Critical Care performed:   Procedures   MEDICATIONS ORDERED IN ED: Medications  albuterol (VENTOLIN HFA) 108 (90 Base) MCG/ACT inhaler 2 puff (has no administration in time range)  carvedilol (COREG) tablet 12.5 mg (12.5 mg Oral Given 11/26/21 1348)  iohexol (OMNIPAQUE) 350 MG/ML injection 60 mL (has no administration in time range)  ipratropium-albuterol (DUONEB) 0.5-2.5 (3) MG/3ML  nebulizer solution 3 mL (3 mLs Nebulization Given 11/26/21 1340)     IMPRESSION / MDM / ASSESSMENT AND PLAN / ED COURSE  I reviewed the triage vital signs and the nursing notes.                              Differential diagnosis includes, but is not limited to, Asthma, copd, CHF, pna, ptx, malignancy, Pe, anemia  Patient presenting to the ER for evaluation of symptoms as described above.  Base on symptoms, risk factors and considered above differential, this presenting complaint could reflect a potentially life-threatening illness therefore the patient will be placed on continuous pulse oximetry and telemetry for monitoring.  Laboratory evaluation will be sent to evaluate for the above complaints.      Clinical Course as of 11/26/21 1553  Fri Nov 26, 2021  1338 Troponin negative.  Platelets are borderline low 90 this appears chronic for him.  Chest x-ray on my review and interpretation does not show any evidence of consolidation or pneumothorax. [PR]  1521 BNP is elevated greater than 4500 similar to last presentation.  He is not hypoxic.  His D-dimer is elevated will order CTA.  If that is negative we will give dose of Lasix and reassess.  Patient be signed out to oncoming physician. [PR]  1550 Patient was being taken to CT when he decided he does not want to stay any longer.  Does not want any further treatment.  States he called his heart failure clinic and would like to follow-up with them.  I did recommend he stay for evaluation due to concern for possible PE and informed him that this could be potential life-threatening illness that is treatable.  Patient was able to repeat this to me include he understands my recommendation to stay for further evaluation and patient will be leaving AGAINST MEDICAL ADVICE. [PR]    Clinical Course User Index [PR] Merlyn Lot, MD     FINAL CLINICAL IMPRESSION(S) / ED DIAGNOSES   Final diagnoses:  Shortness of breath     Rx / DC Orders    ED Discharge Orders     None        Note:  This document was prepared using Dragon voice recognition software and may include unintentional dictation errors.    Merlyn Lot, MD 11/26/21 [phone]

## 2021-11-30 ENCOUNTER — Telehealth: Payer: Self-pay

## 2021-11-30 NOTE — Telephone Encounter (Signed)
   Reason for call: ED-Follow up Visit   Patient  visit on 11/26/2021  at [facility] was for Shortness of breath  Have you been able to follow up with your primary care physician? No, patient advised he prefers to see cardiologist which he made an appointment for 12/02/2021.   The patient was or was not able to obtain any needed medicine or equipment. - N/A  Are there diet recommendations that you are having difficulty following? No  Patient expresses understanding of discharge instructions and education provided has no other needs at this time.    [place] management  [place], [place]  Main Phone: [phone]  E-mail: [email or web address]  Website: [email or web address]

## 2021-12-02 DIAGNOSIS — Z9889 Other specified postprocedural states: Secondary | ICD-10-CM | POA: Diagnosis not present

## 2021-12-02 DIAGNOSIS — I10 Essential (primary) hypertension: Secondary | ICD-10-CM | POA: Diagnosis not present

## 2021-12-02 DIAGNOSIS — Z720 Tobacco use: Secondary | ICD-10-CM | POA: Diagnosis not present

## 2021-12-02 DIAGNOSIS — I5021 Acute systolic (congestive) heart failure: Secondary | ICD-10-CM | POA: Diagnosis not present

## 2021-12-02 DIAGNOSIS — I5023 Acute on chronic systolic (congestive) heart failure: Secondary | ICD-10-CM | POA: Diagnosis not present

## 2021-12-09 ENCOUNTER — Encounter: Payer: Self-pay | Admitting: Emergency Medicine

## 2021-12-09 ENCOUNTER — Emergency Department
Admission: EM | Admit: 2021-12-09 | Discharge: 2021-12-09 | Discharge status: Against Medical Advice | Payer: Medicare Other | Attending: Emergency Medicine | Admitting: Emergency Medicine

## 2021-12-09 ENCOUNTER — Emergency Department: Payer: Medicare Other

## 2021-12-09 ENCOUNTER — Other Ambulatory Visit: Payer: Self-pay

## 2021-12-09 DIAGNOSIS — I509 Heart failure, unspecified: Secondary | ICD-10-CM | POA: Diagnosis not present

## 2021-12-09 DIAGNOSIS — R2243 Localized swelling, mass and lump, lower limb, bilateral: Secondary | ICD-10-CM | POA: Insufficient documentation

## 2021-12-09 DIAGNOSIS — R5383 Other fatigue: Secondary | ICD-10-CM | POA: Diagnosis not present

## 2021-12-09 DIAGNOSIS — Z5321 Procedure and treatment not carried out due to patient leaving prior to being seen by health care provider: Secondary | ICD-10-CM | POA: Insufficient documentation

## 2021-12-09 DIAGNOSIS — R0602 Shortness of breath: Secondary | ICD-10-CM | POA: Insufficient documentation

## 2021-12-09 LAB — CBC
HCT: 47.8 % (ref 39.0–52.0)
Hemoglobin: 15.3 g/dL (ref 13.0–17.0)
MCH: 28.9 pg (ref 26.0–34.0)
MCHC: 32 g/dL (ref 30.0–36.0)
MCV: 90.2 fL (ref 80.0–100.0)
Platelets: 141 10*3/uL — ABNORMAL LOW (ref 150–400)
RBC: 5.3 MIL/uL (ref 4.22–5.81)
RDW: 14.3 % (ref 11.5–15.5)
WBC: 4.1 10*3/uL (ref 4.0–10.5)
nRBC: 0 % (ref 0.0–0.2)

## 2021-12-09 LAB — BASIC METABOLIC PANEL
Anion gap: 4 — ABNORMAL LOW (ref 5–15)
BUN: 30 mg/dL — ABNORMAL HIGH (ref 8–23)
CO2: 30 mmol/L (ref 22–32)
Calcium: 9 mg/dL (ref 8.9–10.3)
Chloride: 109 mmol/L (ref 98–111)
Creatinine, Ser: 1.97 mg/dL — ABNORMAL HIGH (ref 0.61–1.24)
GFR, Estimated: 36 mL/min — ABNORMAL LOW (ref >60)
Glucose, Bld: 98 mg/dL (ref 70–99)
Potassium: 4.1 mmol/L (ref 3.5–5.1)
Sodium: 143 mmol/L (ref 135–145)

## 2021-12-09 LAB — TROPONIN I (HIGH SENSITIVITY): Troponin I (High Sensitivity): 17 ng/L (ref <18)

## 2021-12-09 LAB — BRAIN NATRIURETIC PEPTIDE: B Natriuretic Peptide: 4391.8 pg/mL — ABNORMAL HIGH (ref 0.0–100.0)

## 2021-12-09 NOTE — ED Triage Notes (Signed)
Pt to eR with c/o [facility] for last month and feet and leg swelling for last several days. Pt has hx of CHF.  Was sent over from [facility] for further evaluation.

## 2021-12-09 NOTE — ED Triage Notes (Signed)
First Nurse: Pt here from [facility] with feet swelling, SOB, and fatigue. Pt has a hx of CHF and believes he may be in exacerbation at the moment.

## 2021-12-09 NOTE — ED Notes (Signed)
Pt called for repeat blood work and VS, no answer, pt not in lobby, BR or outside.  Pt LWOT at this time.

## 2021-12-09 NOTE — ED Notes (Signed)
Pt called for room, no answer at this time.

## 2021-12-09 NOTE — ED Provider Triage Note (Signed)
Emergency Medicine Provider Triage Evaluation Note  Jonathon Rios , a 71 y.o. male  was evaluated in triage.  Pt complains of sob/swelling in legs, hx chf.  Review of Systems  Positive:  Negative:   Physical Exam  Ht '6\' 3"'$  (1.905 m)   Wt 80.7 kg   BMI 22.25 kg/m  Gen:   Awake, no distress   Resp:  Normal effort  MSK:   Moves extremities without difficulty  Other:    Medical Decision Making  Medically screening exam initiated at 2:57 PM.  Appropriate orders placed.  BHAVESH VAZQUEZ was informed that the remainder of the evaluation will be completed by another provider, this initial triage assessment does not replace that evaluation, and the importance of remaining in the ED until their evaluation is complete.     Versie Starks, PA-C 12/09/21 1457

## 2021-12-20 DIAGNOSIS — R0602 Shortness of breath: Secondary | ICD-10-CM | POA: Diagnosis not present

## 2021-12-20 DIAGNOSIS — J449 Chronic obstructive pulmonary disease, unspecified: Secondary | ICD-10-CM | POA: Diagnosis not present

## 2021-12-20 DIAGNOSIS — I5020 Unspecified systolic (congestive) heart failure: Secondary | ICD-10-CM | POA: Diagnosis not present

## 2021-12-21 DIAGNOSIS — J449 Chronic obstructive pulmonary disease, unspecified: Secondary | ICD-10-CM | POA: Diagnosis not present

## 2021-12-28 ENCOUNTER — Telehealth: Payer: Self-pay | Admitting: Family

## 2021-12-28 NOTE — Telephone Encounter (Signed)
Pt would like to be called in regards to AFIB. Noone is giving him answers on to why is fatigue and running out of breath. Pt has already been to ER

## 2021-12-31 NOTE — Telephone Encounter (Signed)
Pt returning [place] phone call.  Jenate was busy at the time told pt I would send the msg back.

## 2021-12-31 NOTE — Telephone Encounter (Signed)
LVM to call back to office  

## 2021-12-31 NOTE — Telephone Encounter (Signed)
Called and spoke to patient about his phone message and pt was wanting to know why no one had reached out  to him and gave him any information as to why he was so fatigued and sob. Spoke to [place] about it and we both looked into his chart and saw where he had indeed been to the ED and been discharged on 12/20/21.Upon Advice per Juliann Pulse  we encouraged him to go to the ED because he was still sob and having trouble breathing.the patient stated that he would go this afternoon or tomorrow.I told pt that I would check back on him on Monday to see what was the outcome and schedule him a f/up appt with Joycelyn Schmid.

## 2022-01-03 NOTE — Telephone Encounter (Signed)
Call pt  I do not see that patient was seen in the emergency room. Please triage and ensure that he is safe.  Please schedule follow-up with me.  Please ensure that he has a follow-up scheduled pulmonology, Dr. Raul Del as well.  Dr Raul Del wanted to see him 4 weeks after 12/20/21. Please give him phone number if he needs to schedule

## 2022-01-03 NOTE — Telephone Encounter (Signed)
Spoke to patient and he was on his way to ED in [place] when I called . Pt stated that he did not have a car until today.I informed him that i would just call him back tomorrow  to check on him. Pt was not having any sob at the time

## 2022-01-04 ENCOUNTER — Emergency Department
Admission: EM | Admit: 2022-01-04 | Discharge: 2022-01-04 | Disposition: A | Discharge status: Home / Self Care | Payer: Medicare Other | Attending: Emergency Medicine | Admitting: Emergency Medicine

## 2022-01-04 ENCOUNTER — Emergency Department: Payer: Medicare Other

## 2022-01-04 DIAGNOSIS — J90 Pleural effusion, not elsewhere classified: Secondary | ICD-10-CM | POA: Diagnosis not present

## 2022-01-04 DIAGNOSIS — R7989 Other specified abnormal findings of blood chemistry: Secondary | ICD-10-CM | POA: Diagnosis not present

## 2022-01-04 DIAGNOSIS — I509 Heart failure, unspecified: Secondary | ICD-10-CM | POA: Diagnosis not present

## 2022-01-04 DIAGNOSIS — R600 Localized edema: Secondary | ICD-10-CM | POA: Insufficient documentation

## 2022-01-04 DIAGNOSIS — R0602 Shortness of breath: Secondary | ICD-10-CM | POA: Diagnosis not present

## 2022-01-04 DIAGNOSIS — I5023 Acute on chronic systolic (congestive) heart failure: Secondary | ICD-10-CM | POA: Insufficient documentation

## 2022-01-04 DIAGNOSIS — Z20822 Contact with and (suspected) exposure to covid-19: Secondary | ICD-10-CM | POA: Insufficient documentation

## 2022-01-04 DIAGNOSIS — J9811 Atelectasis: Secondary | ICD-10-CM | POA: Diagnosis not present

## 2022-01-04 DIAGNOSIS — I110 Hypertensive heart disease with heart failure: Secondary | ICD-10-CM | POA: Diagnosis not present

## 2022-01-04 DIAGNOSIS — J439 Emphysema, unspecified: Secondary | ICD-10-CM | POA: Diagnosis not present

## 2022-01-04 LAB — CBC
HCT: 43.7 % (ref 39.0–52.0)
Hemoglobin: 14.2 g/dL (ref 13.0–17.0)
MCH: 28.9 pg (ref 26.0–34.0)
MCHC: 32.5 g/dL (ref 30.0–36.0)
MCV: 88.8 fL (ref 80.0–100.0)
Platelets: 104 10*3/uL — ABNORMAL LOW (ref 150–400)
RBC: 4.92 MIL/uL (ref 4.22–5.81)
RDW: 15.2 % (ref 11.5–15.5)
WBC: 4.1 10*3/uL (ref 4.0–10.5)
nRBC: 0 % (ref 0.0–0.2)

## 2022-01-04 LAB — TROPONIN I (HIGH SENSITIVITY)
Troponin I (High Sensitivity): 17 ng/L (ref <18)
Troponin I (High Sensitivity): 19 ng/L — ABNORMAL HIGH (ref <18)

## 2022-01-04 LAB — BRAIN NATRIURETIC PEPTIDE: B Natriuretic Peptide: >4500 pg/mL — ABNORMAL HIGH (ref 0.0–100.0)

## 2022-01-04 LAB — BASIC METABOLIC PANEL
Anion gap: 6 (ref 5–15)
BUN: 37 mg/dL — ABNORMAL HIGH (ref 8–23)
CO2: 22 mmol/L (ref 22–32)
Calcium: 8.8 mg/dL — ABNORMAL LOW (ref 8.9–10.3)
Chloride: 111 mmol/L (ref 98–111)
Creatinine, Ser: 2.15 mg/dL — ABNORMAL HIGH (ref 0.61–1.24)
GFR, Estimated: 33 mL/min — ABNORMAL LOW (ref >60)
Glucose, Bld: 106 mg/dL — ABNORMAL HIGH (ref 70–99)
Potassium: 4.2 mmol/L (ref 3.5–5.1)
Sodium: 139 mmol/L (ref 135–145)

## 2022-01-04 LAB — SARS CORONAVIRUS 2 BY RT PCR: SARS Coronavirus 2 by RT PCR: NEGATIVE

## 2022-01-04 MED ORDER — FUROSEMIDE 10 MG/ML IJ SOLN
80.0000 mg | Freq: Once | INTRAMUSCULAR | Status: AC
  Administered 2022-01-04: 80 mg via INTRAVENOUS
  Filled 2022-01-04: qty 8

## 2022-01-04 NOTE — Discharge Instructions (Addendum)
This is concerning for worsening of your CHF or fluid buildup.  You should try going up on your Lasix and take 1 dose at 7-8AM and then a second dose around 1 to 2 PM.  Try to keep track of your weights daily.  Do this for 3 days and follow-up with the heart failure clinic so they can reevaluate you decide if you need any other IV diuresis or recheck of your labs to ensure they are not worsening.  We had discussed admission but given you were not requiring oxygen you would prefer to go home which I think is very reasonable however if you develop worsening shortness of breath or fevers you should return to the ER immediately for repeat evaluation

## 2022-01-04 NOTE — ED Provider Notes (Addendum)
[facility] Provider Note    Event Date/Time   First MD Initiated Contact with Patient 01/04/22 1638     (approximate)   History   Shortness of Breath   HPI  Jonathon Rios is a 71 y.o. male with CHF with a EF of 25 to 30%, prior history of cocaine abuse who had cardiac arrest back in 2022 now status post defibrillator with now residual CHF that has been nonischemic in nature status post who comes in with concerns for worsening shortness of breath.  Patient does have a defibrillator in place.  Patient reports he is not felt any shocks or had any passing out episodes patient was seen in the [place] [facility] urgent care and sent over here due to being fluid overload.  He reports being compliant with his Lasix but is not had any change in doses recently.  He reports that he is taking it with still good urine output but is having increased swelling in his legs and shortness of breath.  Denies 1 being bigger than the other.  No history of blood clots.  He reports that he feels like he just has extra fluid on him.  He denies any substance abuse currently.  I reviewed patient's cardiology note from 10/5.  It appears that patient is on 40 of oral Lasix daily.   Physical Exam   Triage Vital Signs: ED Triage Vitals  Enc Vitals Group     BP 01/04/22 1253 (!) 149/108     Pulse Rate 01/04/22 1253 79     Resp 01/04/22 1253 18     Temp 01/04/22 1253 97.8 F (36.6 C)     Temp Source 01/04/22 1253 Oral     SpO2 01/04/22 1252 97 %     Weight 01/04/22 1254 178 lb (80.7 kg)     Height --      Head Circumference --      Peak Flow --      Pain Score 01/04/22 1254 0     Pain Loc --      Pain Edu? --      Excl. in [place]? --     Most recent vital signs: Vitals:   01/04/22 1648 01/04/22 1650  BP:  (!) 150/111  Pulse:  80  Resp:    Temp: (!) 97.5 F (36.4 C)   SpO2:  100%     General: Awake, no distress.  CV:  Good peripheral perfusion.  Resp:  Normal effort.   Abd:  No distention.  Other:  2+ edema bilaterally.  Equal swelling.  No calf tenderness.  Abdomen soft and nontender.  Patient is speaking in full sentences.   ED Results / Procedures / Treatments   Labs (all labs ordered are listed, but only abnormal results are displayed) Labs Reviewed  BASIC METABOLIC PANEL - Abnormal; Notable for the following components:      Result Value   Glucose, Bld 106 (*)    BUN 37 (*)    Creatinine, Ser 2.15 (*)    Calcium 8.8 (*)    GFR, Estimated 33 (*)    All other components within normal limits  CBC - Abnormal; Notable for the following components:   Platelets 104 (*)    All other components within normal limits  BRAIN NATRIURETIC PEPTIDE - Abnormal; Notable for the following components:   B Natriuretic Peptide >4,500.0 (*)    All other components within normal limits     EKG  My  interpretation of EKG:  Normal sinus rate of 79 without any ST elevation, T wave inversions in 2 aVL V5 and V6, normal intervals.  Reviewed prior EKG from 10/13 and has had similar T wave inversions.  RADIOLOGY I have reviewed the xray personally and interpreted and patient has pleural effusion with vascular congestion and mild cardiac enlargement  Reviewed his prior EKGs and he has had cardiomegaly previously  PROCEDURES:  Critical Care performed: No  .1-3 Lead EKG Interpretation  Performed by: Vanessa Caldwell, MD Authorized by: Vanessa , MD     Interpretation: normal     ECG rate:  70   ECG rate assessment: normal     Rhythm: sinus rhythm     Ectopy: none     Conduction: normal      MEDICATIONS ORDERED IN ED: Medications  furosemide (LASIX) injection 80 mg (80 mg Intravenous Given 01/04/22 1808)     IMPRESSION / MDM / Nad[place] / ED COURSE  I reviewed the triage vital signs and the nursing notes.   Patient's presentation is most consistent with severe exacerbation of chronic illness.   I suspect that this is most likely  exacerbation of patient's known CHF.  Patient reports compliance of his Lasix but denies going up on it.  He is only taking it once daily.  Given swelling is equal and this seems very similar to his prior CHF I have low suspicion for PE.  Denies any substance abuse currently.  X-ray ordered to evaluate for any effusions.  Patient's BNP is elevated at greater than 4000.  His BMP shows slightly elevated creatinine up from baseline.  His CBC is reassuring  Chest x-ray does show cardiac enlargement and central vascular congestion with a small right pleural effusion.  We will give 1 dose of IV Lasix and get ambulatory sat   Patient ambulates with oxygen level slightly low at 92% for a second but then recovered to 98 to 100% without significant dyspnea with ambulation.  Patient had significant urine output with the ED of IV Lasix.  This discussed with patient even when he is on the 40 oral Lasix he reports similar urine output.  Given he is only taking this once daily I recommended that he go up to twice daily.  Troponin is 17-19 suspect most likely demand related to the CHF and this was a 5-hour difference not a 2-hour troponin so given this do not feel like this is significantly changing that would require an admission.  COVID test is negative  I offered and discussed admission with patient but given patient is not hypoxic cardiac markers are reassuring patient would prefer to go home.  He does have a significantly low EF with defibrillator so we discussed that there is always a risk for cardiac arrest but at this time he reports that he had the symptoms for 3 weeks and he is actually tried to be seen twice for them but they were long waits yesterday and a few weeks ago so he never has been seen by a doctor.  He reports this has been similar for the past few weeks and that he would prefer to go home and trial the additional Lasix.  We will also get a give him a heart failure referral.  However at this time  patient feels comfortable with discharge home.  The patient is on the cardiac monitor to evaluate for evidence of arrhythmia and/or significant heart rate changes.  FINAL CLINICAL IMPRESSION(S) / ED DIAGNOSES   Final diagnoses:  Acute on chronic congestive heart failure, unspecified heart failure type ([place])     Rx / DC Orders   ED Discharge Orders     None        Note:  This document was prepared using Dragon voice recognition software and may include unintentional dictation errors.   Vanessa [place], MD 01/04/22 1924    Vanessa [place], MD 01/04/22 Kathyrn Drown

## 2022-01-04 NOTE — ED Provider Triage Note (Signed)
  Emergency Medicine Provider Triage Evaluation Note  Jonathon Rios , a 71 y.o.male,  was evaluated in triage.  Pt complains of shortness of breath/fluid overload.  Patient states that his legs have been swelling for the past week and a half.  Endorses a progressive increase in shortness of breath.  Endorses fatigue as well.  He does have a history of heart failure.   Review of Systems  Positive: Shortness of breath, fluid overload. Negative: Denies fever, chest pain, vomiting  Physical Exam   Vitals:   01/04/22 1252 01/04/22 1253  BP:  (!) 149/108  Pulse:  79  Resp:  18  Temp:  97.8 F (36.6 C)  SpO2: 97% 95%   Gen:   Awake, no distress   Resp:  Normal effort  MSK:   Moves extremities without difficulty  Other:    Medical Decision Making  Given the patient's initial medical screening exam, the following diagnostic evaluation has been ordered. The patient will be placed in the appropriate treatment space, once one is available, to complete the evaluation and treatment. I have discussed the plan of care with the patient and I have advised the patient that an ED physician or mid-level practitioner will reevaluate their condition after the test results have been received, as the results may give them additional insight into the type of treatment they may need.    Diagnostics: Labs, EKG, CXR  Treatments: none immediately   Teodoro Spray, [place] 01/04/22 1544

## 2022-01-04 NOTE — ED Notes (Signed)
Pt walk around the room x3. SatO2 dropped to 92% at the lowest and recovered back to 98-100% within a couple seconds.

## 2022-01-04 NOTE — ED Triage Notes (Signed)
Pt sts that he went to the [facility] urgent care and was told to come over here for fluid overload. Pt sts that it has been going on for the last several weeks. Pt does has SOB with fatigue.

## 2022-01-04 NOTE — Telephone Encounter (Signed)
I do not see that he went to ed Pleas check on pt Ensure stable and no cp or sob Please sch f/u appt with me

## 2022-01-05 NOTE — Telephone Encounter (Signed)
Seen in ED

## 2022-01-06 NOTE — Progress Notes (Signed)
Patient ID: Jonathon Rios, male    DOB: 06/19/1950, 71 y.o.   MRN: [id number]  HPI  Jonathon Rios is a 71 y/o male with a history of CAD, HTN, CKD, COPD, seizure, tobacco use, previous alcohol/ drug use and chronic heart failure.   Echo report from 10/09/20 showed an EF of 25-30%  RHC/LHC done 07/30/20 and showed: 1.  Minimal luminal irregularities with no evidence of obstructive coronary artery disease. 2.  Left ventricular angiography was not performed.  EF was 25 to 30% by echo. 3.  Right heart cath patient showed mildly elevated filling pressures, mild to moderate pulmonary hypertension and normal cardiac output.  Was in the ED 01/04/22 due to worsening SOB. Denies any shocks from AICD. BNP >4500. A dose of IV lasix given with good urine output and oral diuretic increased to BID. Elevated troponin thought to be due to demand ischemia. Was in the ED 01/03/22 but left AMA. Was in the ED 12/09/21 but LWBS.   Jonathon Rios presents today for his initial visit with a chief complaint of moderate shortness of breath with minimal exertion. Describes this as chronic in nature. Jonathon Rios has associated fatigue, pedal edema & palpitations along with this. Jonathon Rios denies any difficulty sleeping, dizziness, abdominal distention, chest pain or cough.   Not weighing as Jonathon Rios doesn't have any scales.   Past Medical History:  Diagnosis Date   Alcoholism and drug addiction in family    Arthritis    CHF (congestive heart failure) ([place])    Chronic HFrEF (heart failure with reduced ejection fraction) ([place])    a. 12/2016 Echo: EF 25-30%; b. 09/2020 Echo: EF 25-30%, glob HK, GrI DD, mod reduced RV fxn. ao root 81m.   Chronic kidney disease    COPD (chronic obstructive pulmonary disease) ([facility])    Hypertension    Irregular heart beat    [place]mic [place]) [place]    a. 12/2016 Echo: EF 25-30%; b. 07/2020 Echo: EF 25-30%; c. 09/2020 s/p BSX Emblem MRI S-ICD A219/166520.   Nonobstructive Coronary artery disease    a. 07/2020  Cath: LM nl, LAD min irregs, D1/2 nl, LCX nl,OM1 mild dzs, RCA min irregs, RPDA/RPAV nl.   Positive TB test    Seizure ([facility]    witnessed by family in restaurant   Past Surgical History:  Procedure Laterality Date   CARDIAC CATHETERIZATION     COLONOSCOPY WITH PROPOFOL N/A 08/23/2021   Procedure: COLONOSCOPY WITH PROPOFOL;  Surgeon: VLin Landsman MD;  [facility];  Service: Gastroenterology;  Laterality: N/A;   RIGHT/LEFT HEART CATH AND CORONARY ANGIOGRAPHY N/A 07/30/2020   Procedure: RIGHT/LEFT HEART CATH AND CORONARY ANGIOGRAPHY poss PCI;  Surgeon: [name] MD;  [facility];  Service: Cardiovascular;  Laterality: N/A;   SUBQ ICD IMPLANT N/A 10/15/2020   Procedure: SUBQ ICD IMPLANT;  Surgeon: [name] MD;  [facility];  Service: Cardiovascular;  Laterality: N/A;   TONSILLECTOMY     Family History  Problem Relation Age of Onset   Hypertension Mother    Diabetes Mother    Alcohol abuse Mother    Hyperlipidemia Mother    Hypertension Sister    Alcohol abuse Sister    Asthma Sister    Depression Sister    Drug abuse Sister    Alcohol abuse Father    Hypertension Father    Hypertension Daughter    Early death Daughter    Drug abuse Daughter  Depression Daughter    Alcohol abuse Daughter    Social History   Tobacco Use   Smoking status: Some Days    Packs/day: 0.25    Years: 50.00    Total pack years: 12.50    Types: Cigarettes    Last attempt to quit: 02/23/2017    Years since quitting: 4.8   Smokeless tobacco: Never  Substance Use Topics   Alcohol use: No   Allergies  Allergen Reactions   Penicillins Hives    Has patient had a PCN reaction causing immediate rash, facial/tongue/throat swelling, SOB or lightheadedness with hypotension: Yes Has patient had a PCN reaction causing severe rash involving mucus membranes or skin necrosis: No Has patient had a PCN reaction that required  hospitalization: No Has patient had a PCN reaction occurring within the last 10 years: No If all of the above answers are "NO", then may proceed with Cephalosporin use. Has patient had a PCN reaction causing immediate rash, facial/tongue/throat swelling, SOB or lightheadedness with hypotension: Yes Has patient had a PCN reaction causing severe rash involving mucus membranes or skin necrosis: No Has patient had a PCN reaction that required hospitalization: No Has patient had a PCN reaction occurring within the last 10 years: No If all of the above answers are "NO", then may proceed with Cephalosporin use. Has patient had a PCN reaction causing immediate rash, facial/tongue/throat swelling, SOB or lightheadedness with hypotension: Yes Has patient had a PCN reaction causing severe rash involving mucus membranes or skin necrosis: No Has patient had a PCN reaction that required hospitalization: No Has patient had a PCN reaction occurring within the last 10 years: No If all of the above answers are "NO", then may proceed with Cephalosporin use.   Prior to Admission medications   Medication Sig Start Date End Date Taking? Authorizing Provider  albuterol (VENTOLIN HFA) 108 (90 Base) MCG/ACT inhaler TAKE 2 PUFFS BY MOUTH EVERY 6 HOURS AS NEEDED FOR WHEEZE OR SHORTNESS OF BREATH 06/30/21  Yes [name], FNP  aspirin EC 81 MG tablet Take 1 tablet (81 mg total) by mouth daily. 07/26/18  Yes Wellington Hampshire, MD  benzonatate (TESSALON) 100 MG capsule Take 1 capsule (100 mg total) by mouth 3 (three) times daily as needed for cough. 06/15/21  Yes [name], FNP  budesonide-formoterol (SYMBICORT) 160-4.5 MCG/ACT inhaler TAKE 2 PUFFS BY MOUTH TWICE A DAY 09/08/21  Yes Arnett, Yvetta Coder, FNP  carvedilol (COREG) 25 MG tablet Take 1 tablet (25 mg total) by mouth 2 (two) times daily. 11/02/21  Yes Wellington Hampshire, MD  clotrimazole (LOTRIMIN) 1 % cream APPLY TO AFFECTED AREA TWICE A DAY 10/13/21  Yes  Arnett, Yvetta Coder, FNP  dapagliflozin propanediol (FARXIGA) 10 MG TABS tablet TAKE 1 TABLET BY MOUTH EVERY DAY BEFORE BREAKFAST 10/04/21  Yes Arnett, Yvetta Coder, FNP  feeding supplement (ENSURE ENLIVE / ENSURE PLUS) LIQD Take 237 mLs by mouth 3 (three) times daily between meals. 10/14/20  Yes Enzo Bi, MD  Multiple Vitamin (MULTIVITAMIN WITH MINERALS) TABS tablet Take 1 tablet by mouth daily. 10/15/20  Yes Enzo Bi, MD  rosuvastatin (CRESTOR) 10 MG tablet Take 1 tablet (10 mg total) by mouth daily. 10/04/21  Yes Arnett, Yvetta Coder, FNP  sacubitril-valsartan (ENTRESTO) 97-103 MG Take 1 tablet by mouth 2 (two) times daily. 10/04/21  Yes [name], FNP  tamsulosin (FLOMAX) 0.4 MG CAPS capsule Take 1 capsule (0.4 mg total) by mouth daily. 04/16/21  Yes Stoioff, Ronda Fairly,  MD  triamcinolone (KENALOG) 0.025 % cream Apply 1 application. topically 2 (two) times daily. Do not use longer than 7 days due to skin discoloration 07/12/21  Yes Arnett, Yvetta Coder, FNP  acetaminophen (TYLENOL) 325 MG tablet Take 2 tablets (650 mg total) by mouth every 6 (six) hours as needed for fever. Patient not taking: Reported on 01/07/2022 10/16/20   Elgergawy, Silver Huguenin, MD  furosemide (LASIX) 40 MG tablet Take 2 tablets (80 mg total) by mouth daily. 01/07/22   Alisa Graff, FNP  potassium chloride SA (KLOR-CON M) 20 MEQ tablet Take 2 tablets (40 mEq total) by mouth daily. 01/07/22   Alisa Graff, FNP  sildenafil (VIAGRA) 100 MG tablet Take 1 tablet (100 mg total) by mouth as needed for erectile dysfunction. Patient not taking: Reported on 01/07/2022 11/02/21   Wellington Hampshire, MD   Review of Systems  Constitutional:  Positive for fatigue. Negative for appetite change.  HENT:  Negative for congestion, postnasal drip and sore throat.   Eyes: Negative.   Respiratory:  Positive for shortness of breath. Negative for cough and chest tightness.   Cardiovascular:  Positive for palpitations and leg swelling. Negative for chest  pain.  Gastrointestinal:  Negative for abdominal distention and abdominal pain.  Endocrine: Negative.   Genitourinary: Negative.   Musculoskeletal:  Negative for arthralgias and neck pain.  Skin: Negative.   Allergic/Immunologic: Negative.   Neurological:  Negative for dizziness and light-headedness.  Hematological:  Negative for adenopathy. Does not bruise/bleed easily.  Psychiatric/Behavioral:  Negative for dysphoric mood and sleep disturbance. The patient is not nervous/anxious.    Vitals:   01/07/22 1048  BP: (!) 140/92  Pulse: 74  Resp: 20  SpO2: 100%  Weight: 173 lb (78.5 kg)   Wt Readings from Last 3 Encounters:  01/07/22 173 lb (78.5 kg)  01/04/22 178 lb (80.7 kg)  11/26/21 182 lb (82.6 kg)   Lab Results  Component Value Date   CREATININE 1.98 (H) 01/07/2022   CREATININE 2.15 (H) 01/04/2022   CREATININE 1.97 (H) 12/09/2021   Physical Exam Vitals and nursing note reviewed.  Constitutional:      Appearance: Jonathon Rios is well-developed.  HENT:     Head: Normocephalic and atraumatic.  Neck:     Vascular: No JVD.  Cardiovascular:     Rate and Rhythm: Normal rate and regular rhythm.  Pulmonary:     Effort: Pulmonary effort is normal.     Breath sounds: Normal breath sounds. No wheezing, rhonchi or rales.  Abdominal:     Palpations: Abdomen is soft.     Tenderness: There is no abdominal tenderness.  Musculoskeletal:     Cervical back: Normal range of motion and neck supple.     Right lower leg: No tenderness. Edema (2+ pitting) present.     Left lower leg: No tenderness. Edema (1+ pitting) present.  Skin:    General: Skin is warm and dry.  Neurological:     General: No focal deficit present.     Mental Status: Jonathon Rios is alert and oriented to person, place, and time.  Psychiatric:        Mood and Affect: Mood normal.        Behavior: Behavior normal.    Assessment & Plan:  1: Chronic heart failure with reduced ejection fraction- - NYHA class III - minimally fluid  overloaded today with edema and symptoms - scales provided today and Jonathon Rios was instructed to weigh every morning and call for an  overnight weight gain of > 2 pounds or a weekly weight gain of > 5 pounds - not adding salt to his foods - drinking 4-5 bottles of water daily, 1 cup of coffee and 1 glass of tea daily - not adding salt to his food - AICD placed 10/09/20; no recent shocks - on GDMT of carvedilol, farxiga and entresto - consider spironolactone - saw cardiology ([place]) 12/02/21 - BNP 01/04/22 was >4500.0 - repeat BNP today  2: HTN- - BP mildly elevated (140/92) - saw PCP Vidal Schwalbe) 08/11/21 - saw nephrology (Korrapati) 09/08/21 - BMP 01/04/22 reviewed and showed sodium 139, potassium 4.2, creatinine 2.15 & GFR 33 - repeat BMP today and may adjust diuretic usage but want to get results back first  3: Severe COPD- - saw pulmonology Raul Del) 12/20/21 - smokes 1 cigarette every 2-3 days; has been smoking since the age of 10  4: Lymphedema- - stage II - encouraged to get compression socks and put them on every morning with removal at bedtime - to elevate his legs when sitting for long periods of time - limited in his ability to exercise due to his SOB - consider compression boots if edema persists   Patient did not bring his medications nor a list. Each medication was verbally reviewed with the patient and Jonathon Rios was encouraged to bring the bottles to every visit to confirm accuracy of list.  Return in 3 weeks, sooner if needed.

## 2022-01-07 ENCOUNTER — Other Ambulatory Visit
Admission: RE | Admit: 2022-01-07 | Discharge: 2022-01-07 | Disposition: A | Discharge status: Home / Self Care | Payer: Medicare Other | Source: Ambulatory Visit | Attending: Family | Admitting: Family

## 2022-01-07 ENCOUNTER — Telehealth: Payer: Self-pay | Admitting: Family

## 2022-01-07 ENCOUNTER — Ambulatory Visit (HOSPITAL_BASED_OUTPATIENT_CLINIC_OR_DEPARTMENT_OTHER): Payer: Medicare Other | Admitting: Family

## 2022-01-07 ENCOUNTER — Encounter: Payer: Self-pay | Admitting: Family

## 2022-01-07 VITALS — BP 140/92 | HR 74 | Resp 20 | Wt 173.0 lb

## 2022-01-07 DIAGNOSIS — Z95810 Presence of automatic (implantable) cardiac defibrillator: Secondary | ICD-10-CM | POA: Insufficient documentation

## 2022-01-07 DIAGNOSIS — R002 Palpitations: Secondary | ICD-10-CM | POA: Insufficient documentation

## 2022-01-07 DIAGNOSIS — I10 Essential (primary) hypertension: Secondary | ICD-10-CM | POA: Diagnosis not present

## 2022-01-07 DIAGNOSIS — Z79899 Other long term (current) drug therapy: Secondary | ICD-10-CM | POA: Insufficient documentation

## 2022-01-07 DIAGNOSIS — Z7984 Long term (current) use of oral hypoglycemic drugs: Secondary | ICD-10-CM | POA: Insufficient documentation

## 2022-01-07 DIAGNOSIS — R0602 Shortness of breath: Secondary | ICD-10-CM | POA: Insufficient documentation

## 2022-01-07 DIAGNOSIS — I890 Lymphedema, not elsewhere classified: Secondary | ICD-10-CM

## 2022-01-07 DIAGNOSIS — F17210 Nicotine dependence, cigarettes, uncomplicated: Secondary | ICD-10-CM | POA: Insufficient documentation

## 2022-01-07 DIAGNOSIS — I5022 Chronic systolic (congestive) heart failure: Secondary | ICD-10-CM | POA: Insufficient documentation

## 2022-01-07 DIAGNOSIS — J449 Chronic obstructive pulmonary disease, unspecified: Secondary | ICD-10-CM | POA: Insufficient documentation

## 2022-01-07 DIAGNOSIS — I428 Other cardiomyopathies: Secondary | ICD-10-CM

## 2022-01-07 DIAGNOSIS — N189 Chronic kidney disease, unspecified: Secondary | ICD-10-CM | POA: Insufficient documentation

## 2022-01-07 DIAGNOSIS — I130 Hypertensive heart and chronic kidney disease with heart failure and stage 1 through stage 4 chronic kidney disease, or unspecified chronic kidney disease: Secondary | ICD-10-CM | POA: Insufficient documentation

## 2022-01-07 DIAGNOSIS — R5383 Other fatigue: Secondary | ICD-10-CM | POA: Insufficient documentation

## 2022-01-07 LAB — BASIC METABOLIC PANEL
Anion gap: 8 (ref 5–15)
BUN: 33 mg/dL — ABNORMAL HIGH (ref 8–23)
CO2: 33 mmol/L — ABNORMAL HIGH (ref 22–32)
Calcium: 8.7 mg/dL — ABNORMAL LOW (ref 8.9–10.3)
Chloride: 102 mmol/L (ref 98–111)
Creatinine, Ser: 1.98 mg/dL — ABNORMAL HIGH (ref 0.61–1.24)
GFR, Estimated: 36 mL/min — ABNORMAL LOW (ref >60)
Glucose, Bld: 114 mg/dL — ABNORMAL HIGH (ref 70–99)
Potassium: 3.3 mmol/L — ABNORMAL LOW (ref 3.5–5.1)
Sodium: 143 mmol/L (ref 135–145)

## 2022-01-07 LAB — BRAIN NATRIURETIC PEPTIDE: B Natriuretic Peptide: >4500 pg/mL — ABNORMAL HIGH (ref 0.0–100.0)

## 2022-01-07 MED ORDER — POTASSIUM CHLORIDE CRYS ER 20 MEQ PO TBCR: 40.0000 meq | EXTENDED_RELEASE_TABLET | Freq: Every day | ORAL | 5 refills | Status: DC

## 2022-01-07 MED ORDER — FUROSEMIDE 40 MG PO TABS: 80.0000 mg | ORAL_TABLET | Freq: Every day | ORAL | 5 refills | Status: DC

## 2022-01-07 NOTE — Patient Instructions (Addendum)
Begin weighing daily and call for an overnight weight gain of 3 pounds or more or a weekly weight gain of more than 5 pounds.   If you have voicemail, please make sure your mailbox is cleaned out so that we may leave a message and please make sure to listen to any voicemails.     Bring medication bottles

## 2022-01-07 NOTE — Telephone Encounter (Signed)
Spoke with patient regarding BMP/BNP results obtained earlier today. BNP remains >4500. Renal function slightly improved and potassium is 3.3.  Advised patient to increase his furosemide to '80mg'$  daily (currently taking '40mg'$ ) and begin taking potassium 30mq daily.   Will recheck BMP/BNP next week. Patient verbalized understanding.

## 2022-01-10 ENCOUNTER — Ambulatory Visit: Payer: Medicare Other | Admitting: Family

## 2022-01-11 ENCOUNTER — Telehealth: Payer: Self-pay

## 2022-01-11 NOTE — Telephone Encounter (Signed)
Unable to reach patient, provider notified. Per Jonathon Rios: Please call him and ask him how he's taking his potassium. If it's making him nauseous, make sure he's taking it with food and if he's taking both tablets at the same time, he can try splitting them up at different times. If that doesn't work, I can send in the 33mq tablets but then he'll have to take 4 tablets daily although they are smaller.

## 2022-01-12 ENCOUNTER — Ambulatory Visit (HOSPITAL_BASED_OUTPATIENT_CLINIC_OR_DEPARTMENT_OTHER): Payer: Medicare Other | Admitting: Family

## 2022-01-12 ENCOUNTER — Other Ambulatory Visit
Admission: RE | Admit: 2022-01-12 | Discharge: 2022-01-12 | Disposition: A | Discharge status: Home / Self Care | Payer: Medicare Other | Source: Ambulatory Visit | Attending: Family | Admitting: Family

## 2022-01-12 ENCOUNTER — Encounter: Payer: Self-pay | Admitting: Family

## 2022-01-12 VITALS — BP 124/90 | HR 74 | Resp 18 | Wt 177.0 lb

## 2022-01-12 DIAGNOSIS — I130 Hypertensive heart and chronic kidney disease with heart failure and stage 1 through stage 4 chronic kidney disease, or unspecified chronic kidney disease: Secondary | ICD-10-CM | POA: Insufficient documentation

## 2022-01-12 DIAGNOSIS — I10 Essential (primary) hypertension: Secondary | ICD-10-CM | POA: Diagnosis not present

## 2022-01-12 DIAGNOSIS — F17210 Nicotine dependence, cigarettes, uncomplicated: Secondary | ICD-10-CM | POA: Insufficient documentation

## 2022-01-12 DIAGNOSIS — Z79899 Other long term (current) drug therapy: Secondary | ICD-10-CM | POA: Insufficient documentation

## 2022-01-12 DIAGNOSIS — Z7984 Long term (current) use of oral hypoglycemic drugs: Secondary | ICD-10-CM | POA: Insufficient documentation

## 2022-01-12 DIAGNOSIS — Z95810 Presence of automatic (implantable) cardiac defibrillator: Secondary | ICD-10-CM | POA: Insufficient documentation

## 2022-01-12 DIAGNOSIS — I5022 Chronic systolic (congestive) heart failure: Secondary | ICD-10-CM | POA: Insufficient documentation

## 2022-01-12 DIAGNOSIS — N189 Chronic kidney disease, unspecified: Secondary | ICD-10-CM | POA: Insufficient documentation

## 2022-01-12 DIAGNOSIS — R002 Palpitations: Secondary | ICD-10-CM | POA: Insufficient documentation

## 2022-01-12 DIAGNOSIS — J449 Chronic obstructive pulmonary disease, unspecified: Secondary | ICD-10-CM

## 2022-01-12 DIAGNOSIS — I890 Lymphedema, not elsewhere classified: Secondary | ICD-10-CM | POA: Insufficient documentation

## 2022-01-12 DIAGNOSIS — I272 Pulmonary hypertension, unspecified: Secondary | ICD-10-CM | POA: Insufficient documentation

## 2022-01-12 DIAGNOSIS — R0602 Shortness of breath: Secondary | ICD-10-CM | POA: Insufficient documentation

## 2022-01-12 LAB — BASIC METABOLIC PANEL
Anion gap: 9 (ref 5–15)
BUN: 31 mg/dL — ABNORMAL HIGH (ref 8–23)
CO2: 31 mmol/L (ref 22–32)
Calcium: 9.2 mg/dL (ref 8.9–10.3)
Chloride: 105 mmol/L (ref 98–111)
Creatinine, Ser: 2.06 mg/dL — ABNORMAL HIGH (ref 0.61–1.24)
GFR, Estimated: 34 mL/min — ABNORMAL LOW (ref >60)
Glucose, Bld: 97 mg/dL (ref 70–99)
Potassium: 4 mmol/L (ref 3.5–5.1)
Sodium: 145 mmol/L (ref 135–145)

## 2022-01-12 LAB — BRAIN NATRIURETIC PEPTIDE: B Natriuretic Peptide: >4500 pg/mL — ABNORMAL HIGH (ref 0.0–100.0)

## 2022-01-12 NOTE — Patient Instructions (Signed)
Continue weighing daily and call for an overnight weight gain of 3 pounds or more or a weekly weight gain of more than 5 pounds.   If you have voicemail, please make sure your mailbox is cleaned out so that we may leave a message and please make sure to listen to any voicemails.     

## 2022-01-12 NOTE — Progress Notes (Unsigned)
Patient ID: Jonathon Rios, male    DOB: 1950/03/16, 71 y.o.   MRN: [id number]  HPI  Jonathon Rios is a 71 y/o male with a history of CAD, HTN, CKD, COPD, seizure, tobacco use, previous alcohol/ drug use and chronic heart failure.   Echo report from 10/09/20 showed an EF of 25-30%  RHC/LHC done 07/30/20 and showed: 1.  Minimal luminal irregularities with no evidence of obstructive coronary artery disease. 2.  Left ventricular angiography was not performed.  EF was 25 to 30% by echo. 3.  Right heart cath patient showed mildly elevated filling pressures, mild to moderate pulmonary hypertension and normal cardiac output.  Was in the ED 01/04/22 due to worsening SOB. Denies any shocks from AICD. BNP >4500. A dose of IV lasix given with good urine output and oral diuretic increased to BID. Elevated troponin thought to be due to demand ischemia. Was in the ED 01/03/22 but left AMA. Was in the ED 12/09/21 but LWBS.   Jonathon Rios presents today for a follow-up visit with a chief complaint of moderate fatigue with minimal exertion. Describes this as chronic in nature. Jonathon Rios has associated shortness of breath, palpitations and slight weight gain along with this. Jonathon Rios denies any dizziness, difficulty sleeping, abdominal distention, pedal edema, chest pain or cough.   Jonathon Rios stopped taking the potassium ~ 3 days ago because it was making his nauseous even if Jonathon Rios took them with food.   Past Medical History:  Diagnosis Date   Alcoholism and drug addiction in family    Arthritis    CHF (congestive heart failure) ([place])    Chronic HFrEF (heart failure with reduced ejection fraction) ([place])    a. 12/2016 Echo: EF 25-30%; b. 09/2020 Echo: EF 25-30%, glob HK, GrI DD, mod reduced RV fxn. ao root 61m.   Chronic kidney disease    COPD (chronic obstructive pulmonary disease) ([facility])    Hypertension    Irregular heart beat    NICM (nonischemic cardiomyopathy) ([place]    a. 12/2016 Echo: EF 25-30%; b. 07/2020 Echo: EF 25-30%; c. 09/2020 s/p  BSX Emblem MRI S-ICD A219/166520.   Nonobstructive Coronary artery disease    a. 07/2020 Cath: LM nl, LAD min irregs, D1/2 nl, LCX nl,OM1 mild dzs, RCA min irregs, RPDA/RPAV nl.   Positive TB test    Seizure ([facility]    witnessed by family in restaurant   Past Surgical History:  Procedure Laterality Date   CARDIAC CATHETERIZATION     COLONOSCOPY WITH PROPOFOL N/A 08/23/2021   Procedure: COLONOSCOPY WITH PROPOFOL;  Surgeon: VLin Landsman MD;  [facility];  Service: Gastroenterology;  Laterality: N/A;   RIGHT/LEFT HEART CATH AND CORONARY ANGIOGRAPHY N/A 07/30/2020   Procedure: RIGHT/LEFT HEART CATH AND CORONARY ANGIOGRAPHY poss PCI;  Surgeon: [name] MD;  [facility];  Service: Cardiovascular;  Laterality: N/A;   SUBQ ICD IMPLANT N/A 10/15/2020   Procedure: SUBQ ICD IMPLANT;  Surgeon: [name] MD;  [facility];  Service: Cardiovascular;  Laterality: N/A;   TONSILLECTOMY     Family History  Problem Relation Age of Onset   Hypertension Mother    Diabetes Mother    Alcohol abuse Mother    Hyperlipidemia Mother    Hypertension Sister    Alcohol abuse Sister    Asthma Sister    Depression Sister    Drug abuse Sister    Alcohol abuse Father    Hypertension Father  Hypertension Daughter    Early death Daughter    Drug abuse Daughter    Depression Daughter    Alcohol abuse Daughter    Social History   Tobacco Use   Smoking status: Some Days    Packs/day: 0.25    Years: 50.00    Total pack years: 12.50    Types: Cigarettes    Last attempt to quit: 02/23/2017    Years since quitting: 4.8   Smokeless tobacco: Never  Substance Use Topics   Alcohol use: No   Allergies  Allergen Reactions   Penicillins Hives    Has patient had a PCN reaction causing immediate rash, facial/tongue/throat swelling, SOB or lightheadedness with hypotension: Yes Has patient had a PCN reaction causing severe rash involving mucus  membranes or skin necrosis: No Has patient had a PCN reaction that required hospitalization: No Has patient had a PCN reaction occurring within the last 10 years: No If all of the above answers are "NO", then may proceed with Cephalosporin use. Has patient had a PCN reaction causing immediate rash, facial/tongue/throat swelling, SOB or lightheadedness with hypotension: Yes Has patient had a PCN reaction causing severe rash involving mucus membranes or skin necrosis: No Has patient had a PCN reaction that required hospitalization: No Has patient had a PCN reaction occurring within the last 10 years: No If all of the above answers are "NO", then may proceed with Cephalosporin use. Has patient had a PCN reaction causing immediate rash, facial/tongue/throat swelling, SOB or lightheadedness with hypotension: Yes Has patient had a PCN reaction causing severe rash involving mucus membranes or skin necrosis: No Has patient had a PCN reaction that required hospitalization: No Has patient had a PCN reaction occurring within the last 10 years: No If all of the above answers are "NO", then may proceed with Cephalosporin use.   Prior to Admission medications   Medication Sig Start Date End Date Taking? Authorizing Provider  albuterol (VENTOLIN HFA) 108 (90 Base) MCG/ACT inhaler TAKE 2 PUFFS BY MOUTH EVERY 6 HOURS AS NEEDED FOR WHEEZE OR SHORTNESS OF BREATH 06/30/21  Yes [name], FNP  aspirin EC 81 MG tablet Take 1 tablet (81 mg total) by mouth daily. 07/26/18  Yes Wellington Hampshire, MD  budesonide-formoterol (SYMBICORT) 160-4.5 MCG/ACT inhaler TAKE 2 PUFFS BY MOUTH TWICE A DAY 09/08/21  Yes Arnett, Yvetta Coder, FNP  carvedilol (COREG) 25 MG tablet Take 1 tablet (25 mg total) by mouth 2 (two) times daily. 11/02/21  Yes Wellington Hampshire, MD  clotrimazole (LOTRIMIN) 1 % cream APPLY TO AFFECTED AREA TWICE A DAY 10/13/21  Yes Arnett, Yvetta Coder, FNP  dapagliflozin propanediol (FARXIGA) 10 MG TABS tablet TAKE  1 TABLET BY MOUTH EVERY DAY BEFORE BREAKFAST 10/04/21  Yes Arnett, Yvetta Coder, FNP  feeding supplement (ENSURE ENLIVE / ENSURE PLUS) LIQD Take 237 mLs by mouth 3 (three) times daily between meals. 10/14/20  Yes Enzo Bi, MD  furosemide (LASIX) 40 MG tablet Take 2 tablets (80 mg total) by mouth daily. 01/07/22  Yes Lavere Stork, Aura Fey, FNP  Multiple Vitamin (MULTIVITAMIN WITH MINERALS) TABS tablet Take 1 tablet by mouth daily. 10/15/20  Yes Enzo Bi, MD  rosuvastatin (CRESTOR) 10 MG tablet Take 1 tablet (10 mg total) by mouth daily. 10/04/21  Yes Arnett, Yvetta Coder, FNP  sacubitril-valsartan (ENTRESTO) 97-103 MG Take 1 tablet by mouth 2 (two) times daily. 10/04/21  Yes [name], FNP  tamsulosin (FLOMAX) 0.4 MG CAPS capsule Take 1 capsule (0.4 mg  total) by mouth daily. 04/16/21  Yes Stoioff, Ronda Fairly, MD  acetaminophen (TYLENOL) 325 MG tablet Take 2 tablets (650 mg total) by mouth every 6 (six) hours as needed for fever. Patient not taking: Reported on 01/07/2022 10/16/20   Elgergawy, Silver Huguenin, MD  benzonatate (TESSALON) 100 MG capsule Take 1 capsule (100 mg total) by mouth 3 (three) times daily as needed for cough. Patient not taking: Reported on 01/12/2022 06/15/21   [name], FNP  sildenafil (VIAGRA) 100 MG tablet Take 1 tablet (100 mg total) by mouth as needed for erectile dysfunction. Patient not taking: Reported on 01/12/2022 11/02/21   Wellington Hampshire, MD  triamcinolone (KENALOG) 0.025 % cream Apply 1 application. topically 2 (two) times daily. Do not use longer than 7 days due to skin discoloration 07/12/21   [name], FNP    Review of Systems  Constitutional:  Positive for fatigue. Negative for appetite change.  HENT:  Negative for congestion, postnasal drip and sore throat.   Eyes: Negative.   Respiratory:  Positive for shortness of breath. Negative for cough and chest tightness.   Cardiovascular:  Positive for palpitations. Negative for chest pain and leg swelling.   Gastrointestinal:  Negative for abdominal distention and abdominal pain.  Endocrine: Negative.   Genitourinary: Negative.   Musculoskeletal:  Negative for arthralgias and neck pain.  Skin: Negative.   Allergic/Immunologic: Negative.   Neurological:  Negative for dizziness and light-headedness.  Hematological:  Negative for adenopathy. Does not bruise/bleed easily.  Psychiatric/Behavioral:  Negative for dysphoric mood and sleep disturbance. The patient is not nervous/anxious.    Vitals:   01/12/22 1202  BP: (!) 124/90  Pulse: 74  Resp: 18  SpO2: 100%  Weight: 177 lb (80.3 kg)   Wt Readings from Last 3 Encounters:  01/12/22 177 lb (80.3 kg)  01/07/22 173 lb (78.5 kg)  01/04/22 178 lb (80.7 kg)   Lab Results  Component Value Date   CREATININE 2.06 (H) 01/12/2022   CREATININE 1.98 (H) 01/07/2022   CREATININE 2.15 (H) 01/04/2022   Physical Exam Vitals and nursing note reviewed.  Constitutional:      Appearance: Jonathon Rios is well-developed.  HENT:     Head: Normocephalic and atraumatic.  Neck:     Vascular: No JVD.  Cardiovascular:     Rate and Rhythm: Normal rate and regular rhythm.  Pulmonary:     Effort: Pulmonary effort is normal.     Breath sounds: Normal breath sounds. No wheezing, rhonchi or rales.  Abdominal:     Palpations: Abdomen is soft.     Tenderness: There is no abdominal tenderness.  Musculoskeletal:     Cervical back: Normal range of motion and neck supple.     Right lower leg: No tenderness. Edema (2+ pitting) present.     Left lower leg: No tenderness. Edema (1+ pitting) present.  Skin:    General: Skin is warm and dry.  Neurological:     General: No focal deficit present.     Mental Status: Jonathon Rios is alert and oriented to person, place, and time.  Psychiatric:        Mood and Affect: Mood normal.        Behavior: Behavior normal.    Assessment & Plan:  1: Chronic heart failure with reduced ejection fraction- - NYHA class III - minimally fluid  overloaded today with edema and symptoms - scales provided today and Jonathon Rios was instructed to weigh every morning and call for an overnight  weight gain of > 2 pounds or a weekly weight gain of > 5 pounds - weight 173 pounds from last visit here 5 days ago - not adding salt to his foods - drinking 4-5 bottles of water daily, 1 cup of coffee and 1 glass of tea daily - not adding salt to his food - AICD placed 10/09/20; no recent shocks - on GDMT of carvedilol, farxiga and entresto - consider spironolactone - saw cardiology ([place]) 12/02/21 - BNP 01/04/22 was >4500.0 - repeat BNP today  2: HTN- - BP  - saw PCP (Arnett) 08/11/21 - saw nephrology ([place]) 09/08/21 - BMP 01/04/22 reviewed and showed sodium 139, potassium 4.2, creatinine 2.15 & GFR 33 - repeat BMP today   3: Severe COPD- - saw pulmonology Raul Del) 12/20/21 - smokes 1 cigarette every 2-3 days; has been smoking since the age of 58  4: Lymphedema- - stage II - encouraged to get compression socks and put them on every morning with removal at bedtime - to elevate his legs when sitting for long periods of time - limited in his ability to exercise due to his SOB - consider compression boots if edema persists   Patient did not bring his medications nor a list. Each medication was verbally reviewed with the patient and Jonathon Rios was encouraged to bring the bottles to every visit to confirm accuracy of list.

## 2022-01-13 ENCOUNTER — Telehealth: Payer: Self-pay | Admitting: Family

## 2022-01-13 ENCOUNTER — Ambulatory Visit (INDEPENDENT_AMBULATORY_CARE_PROVIDER_SITE_OTHER): Payer: Medicare Other | Admitting: Dermatology

## 2022-01-13 DIAGNOSIS — D489 Neoplasm of uncertain behavior, unspecified: Secondary | ICD-10-CM

## 2022-01-13 DIAGNOSIS — L219 Seborrheic dermatitis, unspecified: Secondary | ICD-10-CM

## 2022-01-13 DIAGNOSIS — Z79899 Other long term (current) drug therapy: Secondary | ICD-10-CM | POA: Diagnosis not present

## 2022-01-13 DIAGNOSIS — L408 Other psoriasis: Secondary | ICD-10-CM | POA: Diagnosis not present

## 2022-01-13 MED ORDER — KETOCONAZOLE 2 % EX CREA: TOPICAL_CREAM | CUTANEOUS | 11 refills | Status: AC

## 2022-01-13 MED ORDER — HYDROCORTISONE 2.5 % EX CREA: TOPICAL_CREAM | CUTANEOUS | 11 refills | Status: AC

## 2022-01-13 NOTE — Patient Instructions (Addendum)
For rash at face   Apply ketoconazole cream nightly to face on Monday Wednesday and Friday  Apply hydrocortisone 2.5 % cream - apply topically to face nightly Tuesday, Thursday, Saturday   Topical steroids (such as triamcinolone, fluocinolone, fluocinonide, mometasone, clobetasol, halobetasol, betamethasone, hydrocortisone) can cause thinning and lightening of the skin if they are used for too long in the same area. Your physician has selected the right strength medicine for your problem and area affected on the body. Please use your medication only as directed by your physician to prevent side effects.    Due to recent changes in healthcare laws, you may see results of your pathology and/or laboratory studies on MyChart before the doctors have had a chance to review them. We understand that in some cases there may be results that are confusing or concerning to you. Please understand that not all results are received at the same time and often the doctors may need to interpret multiple results in order to provide you with the best plan of care or course of treatment. Therefore, we ask that you please give [place] 2 business days to thoroughly review all your results before contacting the office for clarification. Should we see a critical lab result, you will be contacted sooner.   If You Need Anything After Your Visit  If you have any questions or concerns for your doctor, please call our main line at [phone] and press option 4 to reach your doctor's medical assistant. If no one answers, please leave a voicemail as directed and we will return your call as soon as possible. Messages left after 4 pm will be answered the following business day.   You may also send [place] a message via [place]. We typically respond to MyChart messages within 1-2 business days.  For prescription refills, please ask your pharmacy to contact our office. Our fax number is [phone].  If you have an urgent issue when the  clinic is closed that cannot wait until the next business day, you can page your doctor at the number below.    Please note that while we do our best to be available for urgent issues outside of office hours, we are not available 24/7.   If you have an urgent issue and are unable to reach [place], you may choose to seek medical care at your doctor's office, retail clinic, urgent care center, or emergency room.  If you have a medical emergency, please immediately call 911 or go to the emergency department.  Pager Numbers  - Dr. Nehemiah Massed: [phone]  - Dr. Laurence Ferrari: [phone]  - Dr. Nicole Kindred: [phone]  In the event of inclement weather, please call our main line at [phone] for an update on the status of any delays or closures.  Dermatology Medication Tips: Please keep the boxes that topical medications come in in order to help keep track of the instructions about where and how to use these. Pharmacies typically print the medication instructions only on the boxes and not directly on the medication tubes.   If your medication is too expensive, please contact our office at [phone] option 4 or send [place] a message through [place].   We are unable to tell what your co-pay for medications will be in advance as this is different depending on your insurance coverage. However, we may be able to find a substitute medication at lower cost or fill out paperwork to get insurance to cover a needed medication.   If a prior authorization is required  to get your medication covered by your insurance company, please allow [place] 1-2 business days to complete this process.  Drug prices often vary depending on where the prescription is filled and some pharmacies may offer cheaper prices.  The website www.goodrx.com contains coupons for medications through different pharmacies. The prices here do not account for what the cost may be with help from insurance (it may be cheaper with your insurance), but the  website can give you the price if you did not use any insurance.  - You can print the associated coupon and take it with your prescription to the pharmacy.  - You may also stop by our office during regular business hours and pick up a GoodRx coupon card.  - If you need your prescription sent electronically to a different pharmacy, notify our office through [facility] or by phone at [phone] option 4.     Si Usted Necesita Algo Despus de Su Visita  Tambin puede enviarnos un mensaje a travs de Pharmacist, community. Por lo general respondemos a los mensajes de MyChart en el transcurso de 1 a 2 das hbiles.  Para renovar recetas, por favor pida a su farmacia que se ponga en contacto con nuestra oficina. Harland Dingwall de fax es [place] [phone].  Si tiene un asunto urgente cuando la clnica est cerrada y que no puede esperar hasta el siguiente da hbil, puede llamar/localizar a su doctor(a) al nmero que aparece a continuacin.   Por favor, tenga en cuenta que aunque hacemos todo lo posible para estar disponibles para asuntos urgentes fuera del horario de [place], no estamos disponibles las 24 horas del da, los 7 das de la [place].   Si tiene un problema urgente y no puede comunicarse con nosotros, puede optar por buscar atencin mdica  en el consultorio de su doctor(a), en una clnica privada, en un centro de atencin urgente o en una sala de emergencias.  Si tiene Engineering geologist, por favor llame inmediatamente al 911 o vaya a la sala de emergencias.  Nmeros de bper  - Dr. Nehemiah Massed: [phone]  - Dra. Moye: [phone]  - Dra. Nicole Kindred: [phone]  En caso de inclemencias del [place], por favor llame a Johnsie Kindred principal al [phone] para una actualizacin sobre el [place] de cualquier retraso o cierre.  Consejos para la medicacin en dermatologa: Por favor, guarde las cajas en las que vienen los medicamentos de uso tpico para ayudarle a seguir las  instrucciones sobre dnde y cmo usarlos. Las farmacias generalmente imprimen las instrucciones del medicamento slo en las cajas y no directamente en los tubos del [place].   Si su medicamento es muy caro, por favor, pngase en contacto con Zigmund Daniel llamando al [phone] y presione la opcin 4 o envenos un mensaje a travs de Pharmacist, community.   No podemos decirle cul ser su copago por los medicamentos por adelantado ya que esto es diferente dependiendo de la cobertura de su seguro. Sin embargo, es posible que podamos encontrar un medicamento sustituto a Electrical engineer un formulario para que el seguro cubra el medicamento que se considera necesario.   Si se requiere una autorizacin previa para que su compaa de seguros [place] su medicamento, por favor permtanos de 1 a 2 das hbiles para completar este proceso.  Los precios de los medicamentos varan con frecuencia dependiendo del Environmental consultant de dnde se surte la receta y alguna farmacias pueden ofrecer precios ms baratos.  El sitio web www.goodrx.com tiene cupones para medicamentos de [employer]  farmacias. Los precios aqu no tienen en cuenta lo que podra costar con la ayuda del seguro (puede ser ms barato con su seguro), pero el sitio web puede darle el precio si no utiliz Research scientist (physical sciences).  - Puede imprimir el cupn correspondiente y llevarlo con su receta a la farmacia.  - Tambin puede pasar por nuestra oficina durante el horario de atencin regular y Charity fundraiser una tarjeta de cupones de GoodRx.  - Si necesita que su receta se enve electrnicamente a una farmacia diferente, informe a nuestra oficina a travs de MyChart de [place] o por telfono llamando al [phone] y presione la opcin 4.

## 2022-01-13 NOTE — Progress Notes (Signed)
   New Patient Visit  Subjective  Jonathon Rios is a 71 y.o. male who presents for the following: New Patient (Initial Visit) (Itchy rash at face and has a spot at nose). The patient has spots, moles and lesions to be evaluated, some may be new or changing and the patient has concerns that these could be cancer.  The following portions of the chart were reviewed this encounter and updated as appropriate:   Tobacco  Allergies  Meds  Problems  Med Hx  Surg Hx  Fam Hx     Review of Systems:  No other skin or systemic complaints except as noted in HPI or Assessment and Plan.  Objective  Well appearing patient in no apparent distress; mood and affect are within normal limits.  A focused examination was performed including face and nose. Relevant physical exam findings are noted in the Assessment and Plan.  right nose Papule on right nose     face Pink patches with greasy scale.         Assessment & Plan  Neoplasm of uncertain behavior right nose Benign, observe.  Patient would like removed and is bothered by. Will plan to remove at next follow up in January   Seborrheic dermatitis face With sebo-psoriasis  Seborrheic Dermatitis  -  is a chronic persistent rash characterized by pinkness and scaling most commonly of the mid face but also can occur on the scalp (dandruff), ears; mid chest, mid back and groin.  It tends to be exacerbated by stress and cooler weather.  People who have neurologic disease may experience new onset or exacerbation of existing seborrheic dermatitis.  The condition is not curable but treatable and can be controlled.  Start Apply ketoconazole cream nightly to face on Monday Wednesday and Friday Start Apply hydrocortisone 2.5 % cream - apply topically to face nightly Tuesday, Thursday, Saturday  Topical steroids (such as triamcinolone, fluocinolone, fluocinonide, mometasone, clobetasol, halobetasol, betamethasone, hydrocortisone) can cause  thinning and lightening of the skin if they are used for too long in the same area. Your physician has selected the right strength medicine for your problem and area affected on the body. Please use your medication only as directed by your physician to prevent side effects.   ketoconazole (NIZORAL) 2 % cream - face Apply topically to face qhs on m w Friday for rash hydrocortisone 2.5 % cream - face Apply topically to face qhs t thur and Saturday for rash  Return for janaury follow up seb derm and remove spot on nose.  IRuthell Rummage, CMA, am acting as scribe for Sarina Ser, MD. Documentation: I have reviewed the above documentation for accuracy and completeness, and I agree with the above.  Sarina Ser, MD

## 2022-01-13 NOTE — Telephone Encounter (Signed)
Spoke with patient and reviewed lab results obtained yesterday. Do not want to start spironolactone due to kidney function. Could give 2.'5mg'$  metolazone if he feels like his symptoms are worse.   He expresses concern about taking more pills and that he wants to take the least amount as possible and not make his kidney function any worse. Acknowledged his concerns but also told him that having excess fluid can make kidney function worse.   He says that he doesn't feel very bad and would prefer to not take any additional medication at this time and would like to recheck labs at next visit and re-address this at that time. Advised him to call us back if his symptoms worsen and we can always see him sooner.

## 2022-01-24 ENCOUNTER — Ambulatory Visit (INDEPENDENT_AMBULATORY_CARE_PROVIDER_SITE_OTHER): Payer: Medicare Other

## 2022-01-24 DIAGNOSIS — I5022 Chronic systolic (congestive) heart failure: Secondary | ICD-10-CM | POA: Diagnosis not present

## 2022-01-25 LAB — CUP PACEART REMOTE DEVICE CHECK
Battery Remaining Percentage: 87 %
Date Time Interrogation Session: 20231127163700
Implantable Lead Connection Status: 753985
Implantable Lead Implant Date: 20220818
Implantable Lead Location: 753859
Implantable Lead Model: 3501
Implantable Lead Serial Number: 218222
Implantable Pulse Generator Implant Date: 20220818
Pulse Gen Serial Number: 166520

## 2022-01-27 ENCOUNTER — Ambulatory Visit: Payer: Medicare Other | Admitting: Family

## 2022-02-01 ENCOUNTER — Encounter: Payer: Self-pay | Admitting: Dermatology

## 2022-02-09 ENCOUNTER — Other Ambulatory Visit: Payer: Self-pay | Admitting: Family

## 2022-02-09 DIAGNOSIS — J449 Chronic obstructive pulmonary disease, unspecified: Secondary | ICD-10-CM

## 2022-02-10 ENCOUNTER — Ambulatory Visit: Payer: Medicare Other | Admitting: Family

## 2022-02-10 ENCOUNTER — Telehealth: Payer: Self-pay | Admitting: Family

## 2022-02-10 NOTE — Progress Notes (Deleted)
Patient ID: CHEY CHO, male    DOB: 08/03/1950, 71 y.o.   MRN: [id number]  HPI  Mr Torrence is a 71 y/o male with a history of CAD, HTN, CKD, COPD, seizure, tobacco use, previous alcohol/ drug use and chronic heart failure.   Echo report from 10/09/20 showed an EF of 25-30%  RHC/LHC done 07/30/20 and showed: 1.  Minimal luminal irregularities with no evidence of obstructive coronary artery disease. 2.  Left ventricular angiography was not performed.  EF was 25 to 30% by echo. 3.  Right heart cath patient showed mildly elevated filling pressures, mild to moderate pulmonary hypertension and normal cardiac output.  Was in the ED 01/04/22 due to worsening SOB. Denies any shocks from AICD. BNP >4500. A dose of IV lasix given with good urine output and oral diuretic increased to BID. Elevated troponin thought to be due to demand ischemia. Was in the ED 01/03/22 but left AMA. Was in the ED 12/09/21 but LWBS.   He presents today for a follow-up visit with a chief complaint of   Past Medical History:  Diagnosis Date   Alcoholism and drug addiction in family    Arthritis    CHF (congestive heart failure) ([place])    Chronic HFrEF (heart failure with reduced ejection fraction) ([place])    a. 12/2016 Echo: EF 25-30%; b. 09/2020 Echo: EF 25-30%, glob HK, GrI DD, mod reduced RV fxn. ao root 57m.   Chronic kidney disease    COPD (chronic obstructive pulmonary disease) ([facility])    Hypertension    Irregular heart beat    NICM (nonischemic cardiomyopathy) [place]    a. 12/2016 Echo: EF 25-30%; b. 07/2020 Echo: EF 25-30%; c. 09/2020 s/p BSX Emblem MRI S-ICD A219/166520.   Nonobstructive Coronary artery disease    a. 07/2020 Cath: LM nl, LAD min irregs, D1/2 nl, LCX nl,OM1 mild dzs, RCA min irregs, RPDA/RPAV nl.   Positive TB test    Seizure ([facility]    witnessed by family in restaurant   Past Surgical History:  Procedure Laterality Date   CARDIAC CATHETERIZATION     COLONOSCOPY WITH PROPOFOL N/A 08/23/2021    Procedure: COLONOSCOPY WITH PROPOFOL;  Surgeon: VLin Landsman MD;  [facility];  Service: Gastroenterology;  Laterality: N/A;   RIGHT/LEFT HEART CATH AND CORONARY ANGIOGRAPHY N/A 07/30/2020   Procedure: RIGHT/LEFT HEART CATH AND CORONARY ANGIOGRAPHY poss PCI;  Surgeon: [name] MD;  [facility];  Service: Cardiovascular;  Laterality: N/A;   SUBQ ICD IMPLANT N/A 10/15/2020   Procedure: SUBQ ICD IMPLANT;  Surgeon: [name] MD;  [place];  Service: Cardiovascular;  Laterality: N/A;   TONSILLECTOMY     Family History  Problem Relation Age of Onset   Hypertension Mother    Diabetes Mother    Alcohol abuse Mother    Hyperlipidemia Mother    Hypertension Sister    Alcohol abuse Sister    Asthma Sister    Depression Sister    Drug abuse Sister    Alcohol abuse Father    Hypertension Father    Hypertension Daughter    Early death Daughter    Drug abuse Daughter    Depression Daughter    Alcohol abuse Daughter    Social History   Tobacco Use   Smoking status: Some Days    Packs/day: 0.25    Years: 50.00    Total pack years: 12.50    Types: Cigarettes  Last attempt to quit: 02/23/2017    Years since quitting: 4.9   Smokeless tobacco: Never  Substance Use Topics   Alcohol use: No   Allergies  Allergen Reactions   Penicillins Hives    Has patient had a PCN reaction causing immediate rash, facial/tongue/throat swelling, SOB or lightheadedness with hypotension: Yes Has patient had a PCN reaction causing severe rash involving mucus membranes or skin necrosis: No Has patient had a PCN reaction that required hospitalization: No Has patient had a PCN reaction occurring within the last 10 years: No If all of the above answers are "NO", then may proceed with Cephalosporin use. Has patient had a PCN reaction causing immediate rash, facial/tongue/throat swelling, SOB or lightheadedness with hypotension: Yes Has  patient had a PCN reaction causing severe rash involving mucus membranes or skin necrosis: No Has patient had a PCN reaction that required hospitalization: No Has patient had a PCN reaction occurring within the last 10 years: No If all of the above answers are "NO", then may proceed with Cephalosporin use. Has patient had a PCN reaction causing immediate rash, facial/tongue/throat swelling, SOB or lightheadedness with hypotension: Yes Has patient had a PCN reaction causing severe rash involving mucus membranes or skin necrosis: No Has patient had a PCN reaction that required hospitalization: No Has patient had a PCN reaction occurring within the last 10 years: No If all of the above answers are "NO", then may proceed with Cephalosporin use.     Review of Systems  Constitutional:  Positive for fatigue. Negative for appetite change.  HENT:  Negative for congestion, postnasal drip and sore throat.   Eyes: Negative.   Respiratory:  Positive for shortness of breath. Negative for cough and chest tightness.   Cardiovascular:  Positive for palpitations. Negative for chest pain and leg swelling.  Gastrointestinal:  Negative for abdominal distention and abdominal pain.  Endocrine: Negative.   Genitourinary: Negative.   Musculoskeletal:  Negative for arthralgias and neck pain.  Skin: Negative.   Allergic/Immunologic: Negative.   Neurological:  Negative for dizziness and light-headedness.  Hematological:  Negative for adenopathy. Does not bruise/bleed easily.  Psychiatric/Behavioral:  Negative for dysphoric mood and sleep disturbance. The patient is not nervous/anxious.      Physical Exam Vitals and nursing note reviewed.  Constitutional:      Appearance: He is well-developed.  HENT:     Head: Normocephalic and atraumatic.  Neck:     Vascular: No JVD.  Cardiovascular:     Rate and Rhythm: Normal rate and regular rhythm.  Pulmonary:     Effort: Pulmonary effort is normal.     Breath  sounds: Normal breath sounds. No wheezing, rhonchi or rales.  Abdominal:     Palpations: Abdomen is soft.     Tenderness: There is no abdominal tenderness.  Musculoskeletal:     Cervical back: Normal range of motion and neck supple.     Right lower leg: No tenderness. Edema (2+ pitting) present.     Left lower leg: No tenderness. Edema (1+ pitting) present.  Skin:    General: Skin is warm and dry.  Neurological:     General: No focal deficit present.     Mental Status: He is alert and oriented to person, place, and time.  Psychiatric:        Mood and Affect: Mood normal.        Behavior: Behavior normal.    Assessment & Plan:  1: Chronic heart failure with reduced ejection fraction- -  NYHA class III - euvolemic - weighing daily; reminded to call for an overnight weight gain of > 2 pounds or a weekly weight gain of > 5 pounds - weight 177 pounds from last visit here 1 month ago - not adding salt to his foods - drinking 4-5 bottles of water daily, 1 cup of coffee and 1 glass of tea daily - not adding salt to his food - AICD placed 10/09/20; no recent shocks - on GDMT of carvedilol, farxiga and entresto - potassium supplements made him nauseous - saw cardiology (Paraschos) 12/02/21 - BNP 01/12/22 was >4500.0 - do pro-BNP today  2: HTN- - BP  - saw PCP Vidal Schwalbe) 08/11/21 - saw nephrology (Korrapati) 09/08/21 - BMP 01/12/22 reviewed and showed sodium 145, potassium 4.0, creatinine 2.06 & GFR 34 - repeat BMP today   3: Severe COPD- - saw pulmonology Raul Del) 12/20/21 - smokes 1 cigarette every 2-3 days; has been smoking since the age of 51  4: Lymphedema- - stage II - encouraged to get compression socks and put them on every morning with removal at bedtime - to elevate his legs when sitting for long periods of time - limited in his ability to exercise due to his SOB - consider compression boots if edema persists   Medication bottles reviewed.

## 2022-02-10 NOTE — Telephone Encounter (Signed)
Patient did not show for his Heart Failure Clinic appointment on 02/10/22. Will attempt to reschedule.

## 2022-03-08 NOTE — Progress Notes (Signed)
Remote ICD transmission.   

## 2022-03-09 ENCOUNTER — Other Ambulatory Visit: Payer: Self-pay | Admitting: Family

## 2022-03-09 ENCOUNTER — Other Ambulatory Visit: Payer: Self-pay | Admitting: Urology

## 2022-03-09 DIAGNOSIS — N401 Enlarged prostate with lower urinary tract symptoms: Secondary | ICD-10-CM

## 2022-03-14 ENCOUNTER — Other Ambulatory Visit: Payer: Self-pay | Admitting: Family

## 2022-03-14 DIAGNOSIS — J449 Chronic obstructive pulmonary disease, unspecified: Secondary | ICD-10-CM

## 2022-03-15 ENCOUNTER — Ambulatory Visit: Payer: 59 | Attending: Cardiovascular Disease | Admitting: Cardiovascular Disease
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
## 2022-03-16 ENCOUNTER — Encounter: Payer: Self-pay | Admitting: Cardiovascular Disease

## 2022-03-17 ENCOUNTER — Emergency Department: Payer: 59

## 2022-03-17 ENCOUNTER — Other Ambulatory Visit: Payer: Self-pay

## 2022-03-17 ENCOUNTER — Inpatient Hospital Stay
Admission: EM | Admit: 2022-03-17 | Discharge: 2022-03-19 | DRG: 190 | Disposition: A | Discharge status: Home / Self Care | Payer: 59 | Attending: Internal Medicine | Admitting: Internal Medicine

## 2022-03-17 DIAGNOSIS — I130 Hypertensive heart and chronic kidney disease with heart failure and stage 1 through stage 4 chronic kidney disease, or unspecified chronic kidney disease: Secondary | ICD-10-CM | POA: Diagnosis present

## 2022-03-17 DIAGNOSIS — N1831 Chronic kidney disease, stage 3a: Secondary | ICD-10-CM | POA: Diagnosis present

## 2022-03-17 DIAGNOSIS — E785 Hyperlipidemia, unspecified: Secondary | ICD-10-CM | POA: Diagnosis present

## 2022-03-17 DIAGNOSIS — J441 Chronic obstructive pulmonary disease with (acute) exacerbation: Secondary | ICD-10-CM | POA: Diagnosis not present

## 2022-03-17 DIAGNOSIS — Z8249 Family history of ischemic heart disease and other diseases of the circulatory system: Secondary | ICD-10-CM

## 2022-03-17 DIAGNOSIS — Z79899 Other long term (current) drug therapy: Secondary | ICD-10-CM

## 2022-03-17 DIAGNOSIS — R7989 Other specified abnormal findings of blood chemistry: Secondary | ICD-10-CM | POA: Diagnosis present

## 2022-03-17 DIAGNOSIS — N1832 Chronic kidney disease, stage 3b: Secondary | ICD-10-CM | POA: Diagnosis present

## 2022-03-17 DIAGNOSIS — G40909 Epilepsy, unspecified, not intractable, without status epilepticus: Secondary | ICD-10-CM | POA: Diagnosis present

## 2022-03-17 DIAGNOSIS — I2489 Other forms of acute ischemic heart disease: Secondary | ICD-10-CM | POA: Diagnosis present

## 2022-03-17 DIAGNOSIS — K920 Hematemesis: Secondary | ICD-10-CM | POA: Diagnosis not present

## 2022-03-17 DIAGNOSIS — G4700 Insomnia, unspecified: Secondary | ICD-10-CM | POA: Diagnosis present

## 2022-03-17 DIAGNOSIS — N189 Chronic kidney disease, unspecified: Secondary | ICD-10-CM | POA: Diagnosis present

## 2022-03-17 DIAGNOSIS — J101 Influenza due to other identified influenza virus with other respiratory manifestations: Secondary | ICD-10-CM | POA: Diagnosis present

## 2022-03-17 DIAGNOSIS — Z818 Family history of other mental and behavioral disorders: Secondary | ICD-10-CM

## 2022-03-17 DIAGNOSIS — R0602 Shortness of breath: Secondary | ICD-10-CM | POA: Diagnosis not present

## 2022-03-17 DIAGNOSIS — Z825 Family history of asthma and other chronic lower respiratory diseases: Secondary | ICD-10-CM

## 2022-03-17 DIAGNOSIS — F419 Anxiety disorder, unspecified: Secondary | ICD-10-CM | POA: Diagnosis present

## 2022-03-17 DIAGNOSIS — R64 Cachexia: Secondary | ICD-10-CM | POA: Diagnosis present

## 2022-03-17 DIAGNOSIS — I5022 Chronic systolic (congestive) heart failure: Secondary | ICD-10-CM | POA: Diagnosis not present

## 2022-03-17 DIAGNOSIS — F17210 Nicotine dependence, cigarettes, uncomplicated: Secondary | ICD-10-CM | POA: Diagnosis present

## 2022-03-17 DIAGNOSIS — I428 Other cardiomyopathies: Secondary | ICD-10-CM | POA: Diagnosis present

## 2022-03-17 DIAGNOSIS — K226 Gastro-esophageal laceration-hemorrhage syndrome: Secondary | ICD-10-CM | POA: Diagnosis present

## 2022-03-17 DIAGNOSIS — R197 Diarrhea, unspecified: Secondary | ICD-10-CM | POA: Diagnosis present

## 2022-03-17 DIAGNOSIS — Z95810 Presence of automatic (implantable) cardiac defibrillator: Secondary | ICD-10-CM

## 2022-03-17 DIAGNOSIS — E861 Hypovolemia: Secondary | ICD-10-CM | POA: Diagnosis present

## 2022-03-17 DIAGNOSIS — Z7984 Long term (current) use of oral hypoglycemic drugs: Secondary | ICD-10-CM

## 2022-03-17 DIAGNOSIS — Z83438 Family history of other disorder of lipoprotein metabolism and other lipidemia: Secondary | ICD-10-CM

## 2022-03-17 DIAGNOSIS — I10 Essential (primary) hypertension: Secondary | ICD-10-CM | POA: Diagnosis present

## 2022-03-17 DIAGNOSIS — I2510 Atherosclerotic heart disease of native coronary artery without angina pectoris: Secondary | ICD-10-CM | POA: Diagnosis present

## 2022-03-17 DIAGNOSIS — Z7982 Long term (current) use of aspirin: Secondary | ICD-10-CM

## 2022-03-17 DIAGNOSIS — Z880 Allergy status to penicillin: Secondary | ICD-10-CM

## 2022-03-17 DIAGNOSIS — Z6822 Body mass index (BMI) 22.0-22.9, adult: Secondary | ICD-10-CM

## 2022-03-17 DIAGNOSIS — Z833 Family history of diabetes mellitus: Secondary | ICD-10-CM

## 2022-03-17 DIAGNOSIS — Z1152 Encounter for screening for COVID-19: Secondary | ICD-10-CM

## 2022-03-17 LAB — COMPREHENSIVE METABOLIC PANEL
ALT: 51 U/L — ABNORMAL HIGH (ref 0–44)
AST: 65 U/L — ABNORMAL HIGH (ref 15–41)
Albumin: 3.7 g/dL (ref 3.5–5.0)
Alkaline Phosphatase: 47 U/L (ref 38–126)
Anion gap: 10 (ref 5–15)
BUN: 45 mg/dL — ABNORMAL HIGH (ref 8–23)
CO2: 23 mmol/L (ref 22–32)
Calcium: 8.7 mg/dL — ABNORMAL LOW (ref 8.9–10.3)
Chloride: 102 mmol/L (ref 98–111)
Creatinine, Ser: 1.91 mg/dL — ABNORMAL HIGH (ref 0.61–1.24)
GFR, Estimated: 37 mL/min — ABNORMAL LOW (ref >60)
Glucose, Bld: 124 mg/dL — ABNORMAL HIGH (ref 70–99)
Potassium: 4.2 mmol/L (ref 3.5–5.1)
Sodium: 135 mmol/L (ref 135–145)
Total Bilirubin: 2.1 mg/dL — ABNORMAL HIGH (ref 0.3–1.2)
Total Protein: 7.6 g/dL (ref 6.5–8.1)

## 2022-03-17 LAB — CBC WITH DIFFERENTIAL/PLATELET
Abs Immature Granulocytes: 0.03 10*3/uL (ref 0.00–0.07)
Basophils Absolute: 0 10*3/uL (ref 0.0–0.1)
Basophils Relative: 0 %
Eosinophils Absolute: 0 10*3/uL (ref 0.0–0.5)
Eosinophils Relative: 0 %
HCT: 42.8 % (ref 39.0–52.0)
Hemoglobin: 14.1 g/dL (ref 13.0–17.0)
Immature Granulocytes: 0 %
Lymphocytes Relative: 6 %
Lymphs Abs: 0.5 10*3/uL — ABNORMAL LOW (ref 0.7–4.0)
MCH: 29 pg (ref 26.0–34.0)
MCHC: 32.9 g/dL (ref 30.0–36.0)
MCV: 87.9 fL (ref 80.0–100.0)
Monocytes Absolute: 0.8 10*3/uL (ref 0.1–1.0)
Monocytes Relative: 9 %
Neutro Abs: 7.2 10*3/uL (ref 1.7–7.7)
Neutrophils Relative %: 85 %
Platelets: 91 10*3/uL — ABNORMAL LOW (ref 150–400)
RBC: 4.87 MIL/uL (ref 4.22–5.81)
RDW: 16.5 % — ABNORMAL HIGH (ref 11.5–15.5)
WBC: 8.5 10*3/uL (ref 4.0–10.5)
nRBC: 0 % (ref 0.0–0.2)

## 2022-03-17 LAB — EXPECTORATED SPUTUM ASSESSMENT W GRAM STAIN, RFLX TO RESP C

## 2022-03-17 LAB — APTT: aPTT: 36 seconds (ref 24–36)

## 2022-03-17 LAB — RESP PANEL BY RT-PCR (RSV, FLU A&B, COVID)  RVPGX2
Influenza A by PCR: POSITIVE — AB
Influenza B by PCR: NEGATIVE
Resp Syncytial Virus by PCR: NEGATIVE
SARS Coronavirus 2 by RT PCR: NEGATIVE

## 2022-03-17 LAB — TROPONIN I (HIGH SENSITIVITY)
Troponin I (High Sensitivity): 87 ng/L — ABNORMAL HIGH (ref <18)
Troponin I (High Sensitivity): 91 ng/L — ABNORMAL HIGH (ref <18)

## 2022-03-17 LAB — PROTIME-INR
INR: 1.5 — ABNORMAL HIGH (ref 0.8–1.2)
Prothrombin Time: 18.1 seconds — ABNORMAL HIGH (ref 11.4–15.2)

## 2022-03-17 LAB — BRAIN NATRIURETIC PEPTIDE: B Natriuretic Peptide: >4500 pg/mL — ABNORMAL HIGH (ref 0.0–100.0)

## 2022-03-17 LAB — PROCALCITONIN: Procalcitonin: 0.94 ng/mL

## 2022-03-17 MED ORDER — ALBUTEROL SULFATE HFA 108 (90 BASE) MCG/ACT IN AERS: 1.0000 | INHALATION_SPRAY | RESPIRATORY_TRACT | Status: DC | PRN

## 2022-03-17 MED ORDER — PANTOPRAZOLE SODIUM 40 MG IV SOLR
40.0000 mg | Freq: Two times a day (BID) | INTRAVENOUS | Status: DC
  Administered 2022-03-17 – 2022-03-18 (×2): 40 mg via INTRAVENOUS
  Filled 2022-03-17 (×3): qty 10

## 2022-03-17 MED ORDER — SODIUM CHLORIDE 0.9 % IV SOLN
500.0000 mg | INTRAVENOUS | Status: DC
  Administered 2022-03-17 – 2022-03-18 (×2): 500 mg via INTRAVENOUS
  Filled 2022-03-17 (×2): qty 5
  Filled 2022-03-17: qty 500

## 2022-03-17 MED ORDER — CARVEDILOL 25 MG PO TABS
25.0000 mg | ORAL_TABLET | Freq: Two times a day (BID) | ORAL | Status: DC
  Administered 2022-03-17 – 2022-03-19 (×4): 25 mg via ORAL
  Filled 2022-03-17 (×4): qty 1

## 2022-03-17 MED ORDER — ENOXAPARIN SODIUM 40 MG/0.4ML IJ SOSY
40.0000 mg | PREFILLED_SYRINGE | Freq: Every day | INTRAMUSCULAR | Status: DC
  Administered 2022-03-18 – 2022-03-19 (×2): 40 mg via SUBCUTANEOUS
  Filled 2022-03-17 (×3): qty 0.4

## 2022-03-17 MED ORDER — ROSUVASTATIN CALCIUM 10 MG PO TABS
10.0000 mg | ORAL_TABLET | Freq: Every day | ORAL | Status: DC
  Administered 2022-03-18 – 2022-03-19 (×2): 10 mg via ORAL
  Filled 2022-03-17 (×2): qty 1

## 2022-03-17 MED ORDER — ASPIRIN 81 MG PO CHEW
324.0000 mg | CHEWABLE_TABLET | Freq: Once | ORAL | Status: AC
  Administered 2022-03-17: 324 mg via ORAL
  Filled 2022-03-17: qty 4

## 2022-03-17 MED ORDER — SODIUM CHLORIDE 0.9 % IV SOLN
1.0000 g | INTRAVENOUS | Status: DC
  Administered 2022-03-17 – 2022-03-18 (×2): 1 g via INTRAVENOUS
  Filled 2022-03-17: qty 1
  Filled 2022-03-17 (×2): qty 10

## 2022-03-17 MED ORDER — NICOTINE 14 MG/24HR TD PT24
14.0000 mg | MEDICATED_PATCH | Freq: Every day | TRANSDERMAL | Status: DC
  Administered 2022-03-17 – 2022-03-19 (×3): 14 mg via TRANSDERMAL
  Filled 2022-03-17 (×3): qty 1

## 2022-03-17 MED ORDER — IPRATROPIUM-ALBUTEROL 0.5-2.5 (3) MG/3ML IN SOLN
3.0000 mL | Freq: Four times a day (QID) | RESPIRATORY_TRACT | Status: DC
  Administered 2022-03-17 – 2022-03-18 (×5): 3 mL via RESPIRATORY_TRACT
  Filled 2022-03-17 (×5): qty 3

## 2022-03-17 MED ORDER — FUROSEMIDE 10 MG/ML IJ SOLN
40.0000 mg | Freq: Once | INTRAMUSCULAR | Status: DC
  Filled 2022-03-17: qty 4

## 2022-03-17 MED ORDER — LACTATED RINGERS IV SOLN: INTRAVENOUS | Status: AC

## 2022-03-17 MED ORDER — ORAL CARE MOUTH RINSE: 15.0000 mL | OROMUCOSAL | Status: DC | PRN

## 2022-03-17 MED ORDER — IOHEXOL 350 MG/ML SOLN
75.0000 mL | Freq: Once | INTRAVENOUS | Status: AC | PRN
  Administered 2022-03-17: 75 mL via INTRAVENOUS

## 2022-03-17 MED ORDER — OSELTAMIVIR PHOSPHATE 75 MG PO CAPS
75.0000 mg | ORAL_CAPSULE | Freq: Once | ORAL | Status: DC
  Filled 2022-03-17: qty 1

## 2022-03-17 MED ORDER — IPRATROPIUM-ALBUTEROL 0.5-2.5 (3) MG/3ML IN SOLN
3.0000 mL | Freq: Once | RESPIRATORY_TRACT | Status: AC
  Administered 2022-03-17: 3 mL via RESPIRATORY_TRACT
  Filled 2022-03-17: qty 3

## 2022-03-17 MED ORDER — SACUBITRIL-VALSARTAN 97-103 MG PO TABS
1.0000 | ORAL_TABLET | Freq: Two times a day (BID) | ORAL | Status: DC
  Administered 2022-03-17 – 2022-03-19 (×4): 1 via ORAL
  Filled 2022-03-17 (×4): qty 1

## 2022-03-17 MED ORDER — STERILE WATER FOR INJECTION IJ SOLN
INTRAMUSCULAR | Status: AC
  Administered 2022-03-17: 10 mL
  Filled 2022-03-17: qty 10

## 2022-03-17 MED ORDER — PREDNISONE 20 MG PO TABS
40.0000 mg | ORAL_TABLET | Freq: Every day | ORAL | Status: DC
  Administered 2022-03-18 – 2022-03-19 (×2): 40 mg via ORAL
  Filled 2022-03-17 (×2): qty 2

## 2022-03-17 MED ORDER — FLUTICASONE FUROATE-VILANTEROL 200-25 MCG/ACT IN AEPB
1.0000 | INHALATION_SPRAY | Freq: Every day | RESPIRATORY_TRACT | Status: DC
  Administered 2022-03-18 – 2022-03-19 (×2): 1 via RESPIRATORY_TRACT
  Filled 2022-03-17: qty 28

## 2022-03-17 MED ORDER — TAMSULOSIN HCL 0.4 MG PO CAPS
0.4000 mg | ORAL_CAPSULE | Freq: Every day | ORAL | Status: DC
  Administered 2022-03-18 – 2022-03-19 (×2): 0.4 mg via ORAL
  Filled 2022-03-17 (×2): qty 1

## 2022-03-17 NOTE — Assessment & Plan Note (Addendum)
Troponin mildly elevated at 87-> 91 likely in the setting of significant respiratory illness with increased work of breathing.  No EKG changes.  - Will trend few more troponin

## 2022-03-17 NOTE — ED Provider Notes (Signed)
[facility] Provider Note    Event Date/Time   First MD Initiated Contact with Patient 03/17/22 1037     (approximate)   History   Shortness of Breath   HPI  Jonathon Rios is a 72 y.o. male past medical history of systolic heart failure last EF 20%, hypertension, CKD, nonobstructive coronary disease who presents with shortness of breath.  Patient notes that he is chronically short of breath but over the last week it has been worsening.  He has had a cough with hemoptysis.  Describes bright red blood with coughing full at a time without clots.  He feels short of breath lying flat.  No lower extremity swelling.  Denies fevers or chills.  Denies chest pain.  Multiple family members are sick.  He has been compliant with his medications.  Denies history of DVT/PE     Past Medical History:  Diagnosis Date   Alcoholism and drug addiction in family    Arthritis    CHF (congestive heart failure) ([place])    Chronic HFrEF (heart failure with reduced ejection fraction) ([place])    a. 12/2016 Echo: EF 25-30%; b. 09/2020 Echo: EF 25-30%, glob HK, GrI DD, mod reduced RV fxn. ao root 94m.   Chronic kidney disease    COPD (chronic obstructive pulmonary disease) (HCC)    Hypertension    Irregular heart beat    NICM (nonischemic cardiomyopathy) ([place]    a. 12/2016 Echo: EF 25-30%; b. 07/2020 Echo: EF 25-30%; c. 09/2020 s/p BSX Emblem MRI S-ICD A219/166520.   Nonobstructive Coronary artery disease    a. 07/2020 Cath: LM nl, LAD min irregs, D1/2 nl, LCX nl,OM1 mild dzs, RCA min irregs, RPDA/RPAV nl.   Positive TB test    Seizure ([place]    witnessed by family in restaurant    Patient Active Problem List   Diagnosis Date Noted   COPD with acute exacerbation ([place] 03/17/2022   Hematemesis 03/17/2022   Elevated LFTs 03/17/2022   Demand ischemia 03/17/2022   Positive colorectal cancer screening using Cologuard test    Bronchitis 04/30/2021   Urinary hesitancy 04/06/2021    Influenza A 02/09/2021   Adjustment reaction with anxiety and depression 11/25/2020   Acute exacerbation of CHF (congestive heart failure) ([place] 11/20/2020   Chronic HFrEF (heart failure with reduced ejection fraction) ([place] 10/14/2020   NICM (nonischemic cardiomyopathy) ([place] 10/14/2020   COPD (chronic obstructive pulmonary disease) ([place] 10/14/2020   Malnutrition of moderate degree 10/09/2020   Acute on chronic combined systolic and diastolic CHF (congestive heart failure) ([place] 10/09/2020   Seizures ([place] 10/09/2020   Endotracheally intubated 10/09/2020   Acute respiratory failure with hypoxia ([place] 10/09/2020   Acute kidney injury superimposed on CKD ([place] 10/09/2020   Hypoglycemia 10/09/2020   Cardiac arrest ([place] 10/08/2020   Unstable angina (HCC)    Atherosclerosis of aorta ([place] 07/23/2020   HLD (hyperlipidemia) 04/10/2020   CKD (chronic kidney disease) 04/10/2020   Tinea corporis 05/15/2019   Chronic hepatitis C ([place] 11/07/2018   History of drug abuse ([place] 10/17/2017   HTN (hypertension) 03/16/2017   Tobacco use 03/16/2017   Moderate recurrent major depression ([place] 03/09/2017   PTSD (post-traumatic stress disorder) 03/09/2017   Cocaine abuse (HP[place] 03/09/2017   History of cocaine abuse ([place] 03/09/2017   CHF (congestive heart failure) ([place] 03/07/2017   Acute CHF (congestive heart failure) ([place] 01/02/2017     Physical Exam  Triage Vital Signs: ED Triage Vitals  Enc Vitals Group  BP      Pulse      Resp      Temp      Temp src      SpO2      Weight      Height      Head Circumference      Peak Flow      Pain Score      Pain Loc      Pain Edu?      Excl. in [place]?     Most recent vital signs: Vitals:   03/17/22 1200 03/17/22 1500  BP: (!) 149/110 (!) 149/112  Pulse: 92 (!) 104  Resp: (!) 33 (!) 24  Temp:  98 F (36.7 C)  SpO2: 94% 100%     General: Awake, no distress.  CV:  Good peripheral perfusion.  Trace pitting edema Resp:  Patient is  tachypneic but able to speak in full sentences he has expiratory wheezing but good air movement Abd:  No distention.  Neuro:             Awake, Alert, Oriented x 3  Other:     ED Results / Procedures / Treatments  Labs (all labs ordered are listed, but only abnormal results are displayed) Labs Reviewed  RESP PANEL BY RT-PCR (RSV, FLU A&B, COVID)  RVPGX2 - Abnormal; Notable for the following components:      Result Value   Influenza A by PCR POSITIVE (*)    All other components within normal limits  COMPREHENSIVE METABOLIC PANEL - Abnormal; Notable for the following components:   Glucose, Bld 124 (*)    BUN 45 (*)    Creatinine, Ser 1.91 (*)    Calcium 8.7 (*)    AST 65 (*)    ALT 51 (*)    Total Bilirubin 2.1 (*)    GFR, Estimated 37 (*)    All other components within normal limits  BRAIN NATRIURETIC PEPTIDE - Abnormal; Notable for the following components:   B Natriuretic Peptide >4,500.0 (*)    All other components within normal limits  CBC WITH DIFFERENTIAL/PLATELET - Abnormal; Notable for the following components:   RDW 16.5 (*)    Platelets 91 (*)    Lymphs Abs 0.5 (*)    All other components within normal limits  PROTIME-INR - Abnormal; Notable for the following components:   Prothrombin Time 18.1 (*)    INR 1.5 (*)    All other components within normal limits  TROPONIN I (HIGH SENSITIVITY) - Abnormal; Notable for the following components:   Troponin I (High Sensitivity) 87 (*)    All other components within normal limits  TROPONIN I (HIGH SENSITIVITY) - Abnormal; Notable for the following components:   Troponin I (High Sensitivity) 91 (*)    All other components within normal limits  EXPECTORATED SPUTUM ASSESSMENT W GRAM STAIN, RFLX TO RESP C  APTT  PROCALCITONIN     EKG  EKG shows sinus rhythm with normal axis normal intervals, LVH with repolarization abnormality   RADIOLOGY Chest x-ray reviewed and interpreted by myself shows  cardiomegaly   PROCEDURES:  Critical Care performed: No  .1-3 Lead EKG Interpretation  Performed by: Rada Hay, MD Authorized by: Rada Hay, MD     Interpretation: normal     ECG rate assessment: normal     Rhythm: sinus rhythm     Ectopy: none     Conduction: normal     The patient is on the  cardiac monitor to evaluate for evidence of arrhythmia and/or significant heart rate changes.   MEDICATIONS ORDERED IN ED: Medications  enoxaparin (LOVENOX) injection 40 mg (has no administration in time range)  cefTRIAXone (ROCEPHIN) 1 g in sodium chloride 0.9 % 100 mL IVPB (has no administration in time range)  azithromycin (ZITHROMAX) 500 mg in sodium chloride 0.9 % 250 mL IVPB (has no administration in time range)  ipratropium-albuterol (DUONEB) 0.5-2.5 (3) MG/3ML nebulizer solution 3 mL (3 mLs Nebulization Not Given 03/17/22 1350)  predniSONE (DELTASONE) tablet 40 mg (has no administration in time range)  carvedilol (COREG) tablet 25 mg (has no administration in time range)  fluticasone furoate-vilanterol (BREO ELLIPTA) 200-25 MCG/ACT 1 puff (has no administration in time range)  sacubitril-valsartan (ENTRESTO) 97-103 mg per tablet (has no administration in time range)  rosuvastatin (CRESTOR) tablet 10 mg (has no administration in time range)  tamsulosin (FLOMAX) capsule 0.4 mg (has no administration in time range)  pantoprazole (PROTONIX) injection 40 mg (has no administration in time range)  lactated ringers infusion ( Intravenous Infusion Verify 03/17/22 1547)  Oral care mouth rinse (has no administration in time range)  iohexol (OMNIPAQUE) 350 MG/ML injection 75 mL (75 mLs Intravenous Contrast Given 03/17/22 1206)  ipratropium-albuterol (DUONEB) 0.5-2.5 (3) MG/3ML nebulizer solution 3 mL (3 mLs Nebulization Given 03/17/22 1347)  aspirin chewable tablet 324 mg (324 mg Oral Given 03/17/22 1347)     IMPRESSION / MDM / ASSESSMENT AND PLAN / ED COURSE  I reviewed the  triage vital signs and the nursing notes.                              Patient's presentation is most consistent with acute presentation with potential threat to life or bodily function.  Differential diagnosis includes, but is not limited to, pneumonia, bronchitis, viral illness, COPD exacerbation, CHF exacerbation, pulmonary embolism, pneumothorax, pleural effusion  The patient is a 71 year old male with history of systolic heart failure and COPD who presents with about a week of shortness of breath with hemoptysis.  Describes bright red blood in his cough more than just streaks of blood.  Somewhat difficult to quantify the volume.  Denies associated chest pain but does feel short of breath laying flat without lower extremity swelling.  He was given 125 Solu-Medrol and 2 DuoNebs with EMS.  Arrival to ED he is tachypneic over saturating okay on room air mildly hypertensive.  Does have some expiratory wheezing but is moving good air.  Just trace lower extremity edema but does not look significantly volume overloaded by exam.  Will obtain chest x-ray labs patient will likely need CT given hemoptysis.  Differential is as above.  Survey shows cardiomegaly but no overt pulmonary edema or pneumonia.  Trop elevated at 87 but patient's not having active chest pain.  NP greater than 4500.  CTA is negative for pulmonary embolism does show likely bronchitic changes.  He is flu positive.  Procalcitonin mildly elevated but no lobar pneumonia.  On reassessment patient is still tachypneic he satting 94% overall just looks unwell.  I think given his chronic comorbidities that he should be admitted.  Will give Tamiflu. Clinical Course as of 03/17/22 1618  Thu Mar 17, 2022  1146 Influenza A By PCR(!): POSITIVE [KM]    Clinical Course User Index [KM] Rada Hay, MD     FINAL CLINICAL IMPRESSION(S) / ED DIAGNOSES   Final diagnoses:  COPD exacerbation ([place])  Influenza A     Rx / DC Orders   ED  Discharge Orders     None        Note:  This document was prepared using Dragon voice recognition software and may include unintentional dictation errors.   Rada Hay, MD 03/17/22 [phone]

## 2022-03-17 NOTE — ED Notes (Signed)
MD Charleen Kirks states to hold lasix.

## 2022-03-17 NOTE — Assessment & Plan Note (Addendum)
Patient presenting with 7 days of gradually worsening shortness of breath, productive cough with blood-tinged sputum, fever, chills, nausea, vomiting, diarrhea with influenza A PCR positive.  He states symptoms continue to worsen with no sign of improvement and procalcitonin is elevated concerning for a bacterial coinfection.  - Continue supplemental oxygen to maintain oxygen saturation above 88% - Wean as tolerated - S/p Solu-Medrol 125 mg once on admission - Continue prednisone 40 mg for now to complete a 5-day course - Continue Ceftriaxone and azithromycin - Hold off on further Tamiflu given patient is 7 days out from initial symptoms and risk outweighs benefit - DuoNebs every 6 hours - Continue home bronchodilators Will consider pulmonary consult if no improvement

## 2022-03-17 NOTE — H&P (Addendum)
History and Physical    Patient: Jonathon Rios GBT:[id number] DOB: Jun 19, 1950 DOA: 03/17/2022 DOS: the patient was seen and examined on 03/17/2022 PCP: [name], FNP  Patient coming from: Home  Chief Complaint:  Chief Complaint  Patient presents with   Shortness of Breath   HPI: Jonathon Rios is a 72 y.o. male with medical history significant of HFrEF with last EF of 25-30% (2022) s/p AICD, COPD, hypertension, hyperlipidemia, polysubstance abuse, seizure disorder, who presents to the ED with complaints of shortness of breath.  [name] states that over 1 week ago, his grandchild was sick and the illness spread through the family.  Approximately 7 days ago, he developed fever, chills, nausea, vomiting and diarrhea.  He subsequently developed a productive cough with sputum that gradually became blood-tinged.  He endorses shortness of breath that has progressively worsened with no evidence of improvement.  He denies any chest pain or palpitations.  He notes that his emesis was black in his stool subsequently became black as well.  The last time this occurred was 2 days ago though.  He denies any lower extremity edema, stating that he has barely eaten or drank in the last several days and feels as though he is losing weight.  ED course: On arrival to ED, patient was hypertensive at 150/114 with heart rate of 98.  He was tachypneic up to 37 with oxygen saturation of 94% on room air.  Initial workup remarkable for glucose of 124, BUN of 45, creatinine 1.91, calcium 8.7, AST 65, ALT 51, total bilirubin of 2.1, BNP over 4500, initial troponin of 87, procalcitonin of 0.94 and WBC of 8.5 with platelets of 91.  Influenza A PCR positive. CTA of the chest was obtained that did not demonstrate any PE, moderate emphysema with diffuse bilateral bronchial wall thickening.  Due to increased work of breathing with high risk for decompensation, [facility] contacted for admission.  Review of Systems: As  mentioned in the history of present illness. All other systems reviewed and are negative.  Past Medical History:  Diagnosis Date   Alcoholism and drug addiction in family    Arthritis    CHF (congestive heart failure) ([place])    Chronic HFrEF (heart failure with reduced ejection fraction) ([place])    a. 12/2016 Echo: EF 25-30%; b. 09/2020 Echo: EF 25-30%, glob HK, GrI DD, mod reduced RV fxn. ao root 3m.   Chronic kidney disease    COPD (chronic obstructive pulmonary disease) ([facility])    Hypertension    Irregular heart beat    NICM (nonischemic cardiomyopathy) ([facility]    a. 12/2016 Echo: EF 25-30%; b. 07/2020 Echo: EF 25-30%; c. 09/2020 s/p BSX Emblem MRI S-ICD A219/166520.   Nonobstructive Coronary artery disease    a. 07/2020 Cath: LM nl, LAD min irregs, D1/2 nl, LCX nl,OM1 mild dzs, RCA min irregs, RPDA/RPAV nl.   Positive TB test    Seizure ([facility]    witnessed by family in restaurant   Past Surgical History:  Procedure Laterality Date   CARDIAC CATHETERIZATION     COLONOSCOPY WITH PROPOFOL N/A 08/23/2021   Procedure: COLONOSCOPY WITH PROPOFOL;  Surgeon: VLin Landsman MD;  [facility];  Service: Gastroenterology;  Laterality: N/A;   RIGHT/LEFT HEART CATH AND CORONARY ANGIOGRAPHY N/A 07/30/2020   Procedure: RIGHT/LEFT HEART CATH AND CORONARY ANGIOGRAPHY poss PCI;  Surgeon: [name] MD;  [facility];  Service: Cardiovascular;  Laterality: N/A;   SUBQ ICD IMPLANT N/A  10/15/2020   Procedure: SUBQ ICD IMPLANT;  Surgeon: Vickie Epley, MD;  Location: [place] [facility];  Service: Cardiovascular;  Laterality: N/A;   TONSILLECTOMY     Social History:  reports that he has been smoking cigarettes. He has a 12.50 pack-year smoking history. He has never used smokeless tobacco. He reports that he does not currently use drugs after having used the following drugs: "Crack" cocaine, Heroin, Marijuana, and LSD. He reports that he does not drink  alcohol.  Allergies  Allergen Reactions   Penicillins Hives    Has patient had a PCN reaction causing immediate rash, facial/tongue/throat swelling, SOB or lightheadedness with hypotension: Yes Has patient had a PCN reaction causing severe rash involving mucus membranes or skin necrosis: No Has patient had a PCN reaction that required hospitalization: No Has patient had a PCN reaction occurring within the last 10 years: No If all of the above answers are "NO", then may proceed with Cephalosporin use. Has patient had a PCN reaction causing immediate rash, facial/tongue/throat swelling, SOB or lightheadedness with hypotension: Yes Has patient had a PCN reaction causing severe rash involving mucus membranes or skin necrosis: No Has patient had a PCN reaction that required hospitalization: No Has patient had a PCN reaction occurring within the last 10 years: No If all of the above answers are "NO", then may proceed with Cephalosporin use. Has patient had a PCN reaction causing immediate rash, facial/tongue/throat swelling, SOB or lightheadedness with hypotension: Yes Has patient had a PCN reaction causing severe rash involving mucus membranes or skin necrosis: No Has patient had a PCN reaction that required hospitalization: No Has patient had a PCN reaction occurring within the last 10 years: No If all of the above answers are "NO", then may proceed with Cephalosporin use.    Family History  Problem Relation Age of Onset   Hypertension Mother    Diabetes Mother    Alcohol abuse Mother    Hyperlipidemia Mother    Hypertension Sister    Alcohol abuse Sister    Asthma Sister    Depression Sister    Drug abuse Sister    Alcohol abuse Father    Hypertension Father    Hypertension Daughter    Early death Daughter    Drug abuse Daughter    Depression Daughter    Alcohol abuse Daughter     Prior to Admission medications   Medication Sig Start Date End Date Taking? Authorizing Provider   ipratropium-albuterol (DUONEB) 0.5-2.5 (3) MG/3ML SOLN Inhale 3 mLs into the lungs every 4 (four) hours as needed. 12/20/21 12/15/22 Yes [provider]  acetaminophen (TYLENOL) 325 MG tablet Take 2 tablets (650 mg total) by mouth every 6 (six) hours as needed for fever. Patient not taking: Reported on 01/07/2022 10/16/20   Elgergawy, Silver Huguenin, MD  albuterol (VENTOLIN HFA) 108 (90 Base) MCG/ACT inhaler TAKE 2 PUFFS BY MOUTH EVERY 6 HOURS AS NEEDED FOR WHEEZE OR SHORTNESS OF BREATH 03/14/22   [name], FNP  aspirin EC 81 MG tablet Take 1 tablet (81 mg total) by mouth daily. Patient not taking: Reported on 01/13/2022 07/26/18   Wellington Hampshire, MD  benzonatate (TESSALON) 100 MG capsule Take 1 capsule (100 mg total) by mouth 3 (three) times daily as needed for cough. Patient not taking: Reported on 01/12/2022 06/15/21   [name], FNP  budesonide-formoterol [facility]) 160-4.5 MCG/ACT inhaler TAKE 2 PUFFS BY MOUTH TWICE A DAY 09/08/21   [name], FNP  carvedilol (COREG) 25 MG tablet Take 1 tablet (25 mg total) by mouth 2 (two) times daily. 11/02/21   Wellington Hampshire, MD  clotrimazole (LOTRIMIN) 1 % cream APPLY TO AFFECTED AREA TWICE A DAY 10/13/21   [name], FNP  ENTRESTO 97-103 MG TAKE 1 TABLET BY MOUTH TWICE A DAY 03/09/22   [name], FNP  FARXIGA 10 MG TABS tablet TAKE 1 TABLET BY MOUTH EVERY DAY BEFORE BREAKFAST 03/09/22   [name], FNP  feeding supplement (ENSURE ENLIVE / ENSURE PLUS) LIQD Take 237 mLs by mouth 3 (three) times daily between meals. 10/14/20   Enzo Bi, MD  furosemide (LASIX) 40 MG tablet Take 2 tablets (80 mg total) by mouth daily. 01/07/22   Alisa Graff, FNP  hydrocortisone 2.5 % cream Apply topically to face qhs t thur and Saturday for rash 01/13/22   Ralene Bathe, MD  ketoconazole (NIZORAL) 2 % cream Apply topically to face qhs on m w Friday for rash 01/13/22   Ralene Bathe, MD  Multiple Vitamin  (MULTIVITAMIN WITH MINERALS) TABS tablet Take 1 tablet by mouth daily. 10/15/20   Enzo Bi, MD  potassium chloride SA (KLOR-CON M) 20 MEQ tablet Take 2 tablets (40 mEq total) by mouth daily. Patient not taking: Reported on 01/12/2022 01/07/22   Alisa Graff, FNP  rosuvastatin (CRESTOR) 10 MG tablet Take 1 tablet (10 mg total) by mouth daily. 10/04/21   [name], FNP  sildenafil (VIAGRA) 100 MG tablet Take 1 tablet (100 mg total) by mouth as needed for erectile dysfunction. Patient not taking: Reported on 01/12/2022 11/02/21   Wellington Hampshire, MD  tamsulosin (FLOMAX) 0.4 MG CAPS capsule TAKE 1 CAPSULE BY MOUTH EVERY DAY 03/09/22   Stoioff, Ronda Fairly, MD  triamcinolone (KENALOG) 0.025 % cream Apply 1 application. topically 2 (two) times daily. Do not use longer than 7 days due to skin discoloration 07/12/21   [name], FNP   Physical Exam: Vitals:   03/17/22 1045 03/17/22 1100 03/17/22 1130 03/17/22 1200  BP: (!) 139/102 (!) 150/114 (!) 146/104 (!) 149/110  Pulse: 98 99 95 92  Resp: (!) 45 (!) 21 (!) 38 (!) 33  Temp:      TempSrc:      SpO2: 98% 97% 94% 94%  Weight: 78.2 kg     Height: '6\' 2"'$  (1.88 m)      Physical Exam Vitals and nursing note reviewed.  Constitutional:      Appearance: He is normal weight. He is ill-appearing.  HENT:     Head: Normocephalic and atraumatic.  Cardiovascular:     Rate and Rhythm: Normal rate and regular rhythm.     Heart sounds: No murmur heard. Pulmonary:     Effort: Tachypnea and accessory muscle usage present.     Breath sounds: Wheezing (diffuse expiratory wheezing in bilateral upper and middle lung fields) and rales (Minimal bibasilar rales) present. No decreased breath sounds or rhonchi.  Abdominal:     General: Bowel sounds are normal.     Palpations: Abdomen is soft.     Tenderness: There is no abdominal tenderness.  Musculoskeletal:     Right lower leg: No edema.     Left lower leg: No edema.  Skin:    General: Skin is  warm and dry.  Neurological:     General: No focal deficit present.     Mental Status: He is alert and oriented to person, place, and time.  Psychiatric:        Mood and Affect: Mood normal.        Behavior: Behavior normal.    Data Reviewed: CBC with WBC of 8.5, hemoglobin of 14.1, and platelets of 91 CMP with sodium 135, potassium 4.2, bicarb 23, glucose 124, BUN 45, creatinine 1.91, AST 65, ALT 51, total bilirubin of 2.1 and GFR of 37 BNP elevated above 4500 Troponin elevated 87 Procalcitonin elevated 0.94 INR elevated 1.5 PTT within normal limits at 36 Influenza A PCR positive.  COVID-19 RSV negative.  EKG personally reviewed.  Sinus rhythm with rate of 97.  Repolarization changes noted.  Compared to EKG on January 04, 2022, no changes noted  CT Angio Chest PE W and/or Wo Contrast  Result Date: 03/17/2022 CLINICAL DATA:  PE suspected, worsening shortness of breath COPD, hemoptysis EXAM: CT ANGIOGRAPHY CHEST WITH CONTRAST TECHNIQUE: Multidetector CT imaging of the chest was performed using the standard protocol during bolus administration of intravenous contrast. Multiplanar CT image reconstructions and MIPs were obtained to evaluate the vascular anatomy. RADIATION DOSE REDUCTION: This exam was performed according to the departmental dose-optimization program which includes automated exposure control, adjustment of the mA and/or kV according to patient size and/or use of iterative reconstruction technique. CONTRAST:  43m OMNIPAQUE IOHEXOL 350 MG/ML SOLN COMPARISON:  None Available. FINDINGS: Cardiovascular: Satisfactory opacification of the pulmonary arteries to the segmental level. No evidence of pulmonary embolism. Cardiomegaly. No pericardial effusion. Scattered aortic atherosclerosis. Mediastinum/Nodes: No enlarged mediastinal, hilar, or axillary lymph nodes. Thyroid gland, trachea, and esophagus demonstrate no significant findings. Lungs/Pleura: Moderate centrilobular and paraseptal  emphysema. Diffuse bilateral bronchial wall thickening. No pleural effusion or pneumothorax. Upper Abdomen: No acute abnormality. Musculoskeletal: No chest wall abnormality. No acute osseous findings. Review of the MIP images confirms the above findings. IMPRESSION: 1. Negative examination for pulmonary embolism. 2. Moderate emphysema and diffuse bilateral bronchial wall thickening consistent with nonspecific infectious or inflammatory bronchitis. 3. Cardiomegaly. Aortic Atherosclerosis (ICD10-I70.0) and Emphysema (ICD10-J43.9). Electronically Signed   By: ADelanna AhmadiM.D.   On: 03/17/2022 12:21   DG Chest Portable 1 View  Result Date: 03/17/2022 CLINICAL DATA:  Shortness of breath worsening since yesterday. Wheezing and hemoptysis. EXAM: PORTABLE CHEST 1 VIEW COMPARISON:  Chest radiograph 01/04/2022 FINDINGS: The left chest wall cardiac device and associated leads are stable. The heart is enlarged, unchanged. The upper mediastinal contours are normal. There is vascular congestion without definite overt pulmonary edema. There is no focal airspace opacity. There is no pleural effusion or pneumothorax There is no acute osseous abnormality. IMPRESSION: Cardiomegaly with vascular congestion but no definite overt pulmonary edema. No focal consolidation or pleural effusion. Electronically Signed   By: PValetta MoleM.D.   On: 03/17/2022 11:28    There are no new results to review at this time.  Assessment and Plan: * COPD with acute exacerbation ([facility] Patient presenting with 7 days of gradually worsening shortness of breath, productive cough with blood-tinged sputum, fever, chills, nausea, vomiting, diarrhea with influenza A PCR positive.  He states symptoms continue to worsen with no sign of improvement and procalcitonin is elevated concerning for a bacterial coinfection.  - Continue supplemental oxygen to maintain oxygen saturation above 88% - Wean as tolerated - S/p Solu-Medrol 125 mg once - Start  prednisone 40 mg tomorrow to complete a 5-day course - Continue Ceftriaxone and azithromycin - Hold off on further Tamiflu given patient is 7 days out from initial symptoms and risk outweighs benefit - DuoNebs every 6  hours - Continue home bronchodilators  Hematemesis Patient endorses black vomit with melena in the setting of repeated episodes of vomiting/diarrhea.  Likely Mallory-Weiss and it seems to have self resolved.  Will hold off on GI consultation unless additional episodes occur.  Hemoglobin is stable.  - Start Protonix IV twice daily with transition to oral on discharge - Monitor hemoglobin while admitted - Zofran as needed for nausea - IV fluids  Chronic HFrEF (heart failure with reduced ejection fraction) ([place]) Patient appears hypovolemic on examination with no lower extremity pitting edema.  He has been vomiting/diarrhea for several days with poor p.o. intake.  BNP is elevated above 4500, however it appears this has been consistent and unchanged.  - Continue GDMT - Hold home Lasix and Farxiga for today.  Consider restarting tomorrow  HTN (hypertension) - Continue home antihypertensives  CKD (chronic kidney disease) Renal function currently at baseline.  - Repeat BMP in the a.m.  Elevated LFTs Mildly elevated LFTs.  Potentially in the setting of viral infection versus secondary to polysubstance abuse versus chronic hepatitis C  - Repeat CMP in a.m.  Demand ischemia Troponin mildly elevated at 87 in the setting of significant respiratory illness with increased work of breathing.  No EKG changes.  - Repeat troponin pending  Advance Care Planning:   Code Status: Full Code. Verified by patient.   Consults: None  Family Communication: Patient's sister updated at bedside  Severity of Illness: The appropriate patient status for this patient is OBSERVATION. Observation status is judged to be reasonable and necessary in order to provide the required intensity of  service to ensure the patient's safety. The patient's presenting symptoms, physical exam findings, and initial radiographic and laboratory data in the context of their medical condition is felt to place them at decreased risk for further clinical deterioration. Furthermore, it is anticipated that the patient will be medically stable for discharge from the hospital within 2 midnights of admission.   Author: Jose Persia, MD 03/17/2022 2:05 PM  For on call review [email or web address].

## 2022-03-17 NOTE — Assessment & Plan Note (Addendum)
Patient appears hypovolemic on examination with no lower extremity pitting edema.  He has been vomiting/diarrhea for several days with poor p.o. intake.  BNP is elevated above 4500, however it appears this has been consistent and unchanged.  - Continue GDMT - Hold home Lasix and Farxiga for today as he still not eating much and does not have good appetite.   Will consult palliative care for goals of care conversation

## 2022-03-17 NOTE — Assessment & Plan Note (Addendum)
Renal function currently at baseline.  CKD stage IIIa  - Repeat BMP in the a.m.

## 2022-03-17 NOTE — Assessment & Plan Note (Addendum)
-  Continue home antihypertensives 

## 2022-03-17 NOTE — ED Notes (Signed)
MD at bedside placing orders

## 2022-03-17 NOTE — Plan of Care (Signed)

## 2022-03-17 NOTE — Assessment & Plan Note (Addendum)
Mildly elevated LFTs.  Potentially in the setting of viral infection versus secondary to polysubstance abuse versus chronic hepatitis C  - LFTs still trending up though not significant elevation. will need close monitoring

## 2022-03-17 NOTE — ED Notes (Signed)
Informed RN bed assigned 

## 2022-03-17 NOTE — ED Triage Notes (Signed)
Pt BIBA from home where pt reports worsening SOB since yesterday. PMH COPD, HTN. Pt reports wheezing and coughing up blood. EMS gave 2 duonebs. Provider at bedside. Pt able to speak in complete sentences.

## 2022-03-17 NOTE — Assessment & Plan Note (Addendum)
Patient endorses black vomit with melena in the setting of repeated episodes of vomiting/diarrhea.  Likely Mallory-Weiss and it seems to have self resolved.  Will hold off on GI consultation unless additional episodes occur.  Hemoglobin is stable.  - Transition to Protonix 40 mg p.o. twice daily as he is tolerating diet - Monitor H&H - Zofran as needed for nausea - Continue IV fluids -Patient denies any further vomiting or diarrhea while in the hospital

## 2022-03-18 ENCOUNTER — Ambulatory Visit: Payer: Medicaid Other | Admitting: Family

## 2022-03-18 DIAGNOSIS — R0602 Shortness of breath: Secondary | ICD-10-CM | POA: Diagnosis present

## 2022-03-18 DIAGNOSIS — G4700 Insomnia, unspecified: Secondary | ICD-10-CM | POA: Diagnosis present

## 2022-03-18 DIAGNOSIS — E785 Hyperlipidemia, unspecified: Secondary | ICD-10-CM | POA: Diagnosis present

## 2022-03-18 DIAGNOSIS — G40909 Epilepsy, unspecified, not intractable, without status epilepticus: Secondary | ICD-10-CM | POA: Diagnosis present

## 2022-03-18 DIAGNOSIS — R7989 Other specified abnormal findings of blood chemistry: Secondary | ICD-10-CM | POA: Diagnosis present

## 2022-03-18 DIAGNOSIS — I428 Other cardiomyopathies: Secondary | ICD-10-CM | POA: Diagnosis present

## 2022-03-18 DIAGNOSIS — N1831 Chronic kidney disease, stage 3a: Secondary | ICD-10-CM | POA: Diagnosis not present

## 2022-03-18 DIAGNOSIS — Z8249 Family history of ischemic heart disease and other diseases of the circulatory system: Secondary | ICD-10-CM | POA: Diagnosis not present

## 2022-03-18 DIAGNOSIS — Z6822 Body mass index (BMI) 22.0-22.9, adult: Secondary | ICD-10-CM | POA: Diagnosis not present

## 2022-03-18 DIAGNOSIS — J441 Chronic obstructive pulmonary disease with (acute) exacerbation: Secondary | ICD-10-CM | POA: Diagnosis not present

## 2022-03-18 DIAGNOSIS — I2489 Other forms of acute ischemic heart disease: Secondary | ICD-10-CM | POA: Diagnosis not present

## 2022-03-18 DIAGNOSIS — K920 Hematemesis: Secondary | ICD-10-CM | POA: Diagnosis not present

## 2022-03-18 DIAGNOSIS — I5022 Chronic systolic (congestive) heart failure: Secondary | ICD-10-CM | POA: Diagnosis present

## 2022-03-18 DIAGNOSIS — E861 Hypovolemia: Secondary | ICD-10-CM | POA: Diagnosis present

## 2022-03-18 DIAGNOSIS — Z818 Family history of other mental and behavioral disorders: Secondary | ICD-10-CM | POA: Diagnosis not present

## 2022-03-18 DIAGNOSIS — Z1152 Encounter for screening for COVID-19: Secondary | ICD-10-CM | POA: Diagnosis not present

## 2022-03-18 DIAGNOSIS — I2510 Atherosclerotic heart disease of native coronary artery without angina pectoris: Secondary | ICD-10-CM | POA: Diagnosis present

## 2022-03-18 DIAGNOSIS — F17210 Nicotine dependence, cigarettes, uncomplicated: Secondary | ICD-10-CM | POA: Diagnosis present

## 2022-03-18 DIAGNOSIS — K226 Gastro-esophageal laceration-hemorrhage syndrome: Secondary | ICD-10-CM | POA: Diagnosis present

## 2022-03-18 DIAGNOSIS — R197 Diarrhea, unspecified: Secondary | ICD-10-CM | POA: Diagnosis present

## 2022-03-18 DIAGNOSIS — Z825 Family history of asthma and other chronic lower respiratory diseases: Secondary | ICD-10-CM | POA: Diagnosis not present

## 2022-03-18 DIAGNOSIS — J101 Influenza due to other identified influenza virus with other respiratory manifestations: Secondary | ICD-10-CM | POA: Diagnosis present

## 2022-03-18 DIAGNOSIS — R64 Cachexia: Secondary | ICD-10-CM | POA: Diagnosis present

## 2022-03-18 DIAGNOSIS — F419 Anxiety disorder, unspecified: Secondary | ICD-10-CM | POA: Diagnosis present

## 2022-03-18 DIAGNOSIS — I130 Hypertensive heart and chronic kidney disease with heart failure and stage 1 through stage 4 chronic kidney disease, or unspecified chronic kidney disease: Secondary | ICD-10-CM | POA: Diagnosis present

## 2022-03-18 DIAGNOSIS — Z95810 Presence of automatic (implantable) cardiac defibrillator: Secondary | ICD-10-CM | POA: Diagnosis not present

## 2022-03-18 LAB — COMPREHENSIVE METABOLIC PANEL
ALT: 96 U/L — ABNORMAL HIGH (ref 0–44)
AST: 94 U/L — ABNORMAL HIGH (ref 15–41)
Albumin: 3.3 g/dL — ABNORMAL LOW (ref 3.5–5.0)
Alkaline Phosphatase: 47 U/L (ref 38–126)
Anion gap: 7 (ref 5–15)
BUN: 56 mg/dL — ABNORMAL HIGH (ref 8–23)
CO2: 25 mmol/L (ref 22–32)
Calcium: 8.3 mg/dL — ABNORMAL LOW (ref 8.9–10.3)
Chloride: 100 mmol/L (ref 98–111)
Creatinine, Ser: 2.02 mg/dL — ABNORMAL HIGH (ref 0.61–1.24)
GFR, Estimated: 35 mL/min — ABNORMAL LOW (ref >60)
Glucose, Bld: 117 mg/dL — ABNORMAL HIGH (ref 70–99)
Potassium: 4.8 mmol/L (ref 3.5–5.1)
Sodium: 132 mmol/L — ABNORMAL LOW (ref 135–145)
Total Bilirubin: 1.5 mg/dL — ABNORMAL HIGH (ref 0.3–1.2)
Total Protein: 7.3 g/dL (ref 6.5–8.1)

## 2022-03-18 LAB — CBC WITH DIFFERENTIAL/PLATELET
Abs Immature Granulocytes: 0.03 10*3/uL (ref 0.00–0.07)
Basophils Absolute: 0 10*3/uL (ref 0.0–0.1)
Basophils Relative: 0 %
Eosinophils Absolute: 0 10*3/uL (ref 0.0–0.5)
Eosinophils Relative: 0 %
HCT: 42.6 % (ref 39.0–52.0)
Hemoglobin: 14 g/dL (ref 13.0–17.0)
Immature Granulocytes: 0 %
Lymphocytes Relative: 6 %
Lymphs Abs: 0.6 10*3/uL — ABNORMAL LOW (ref 0.7–4.0)
MCH: 29.2 pg (ref 26.0–34.0)
MCHC: 32.9 g/dL (ref 30.0–36.0)
MCV: 88.8 fL (ref 80.0–100.0)
Monocytes Absolute: 0.9 10*3/uL (ref 0.1–1.0)
Monocytes Relative: 9 %
Neutro Abs: 7.7 10*3/uL (ref 1.7–7.7)
Neutrophils Relative %: 85 %
Platelets: 94 10*3/uL — ABNORMAL LOW (ref 150–400)
RBC: 4.8 MIL/uL (ref 4.22–5.81)
RDW: 16.6 % — ABNORMAL HIGH (ref 11.5–15.5)
WBC: 9.2 10*3/uL (ref 4.0–10.5)
nRBC: 0 % (ref 0.0–0.2)

## 2022-03-18 LAB — HIV ANTIBODY (ROUTINE TESTING W REFLEX): HIV Screen 4th Generation wRfx: NONREACTIVE

## 2022-03-18 LAB — TROPONIN I (HIGH SENSITIVITY)
Troponin I (High Sensitivity): 52 ng/L — ABNORMAL HIGH (ref <18)
Troponin I (High Sensitivity): 50 ng/L — ABNORMAL HIGH (ref <18)

## 2022-03-18 MED ORDER — PROSOURCE PLUS PO LIQD
30.0000 mL | Freq: Three times a day (TID) | ORAL | Status: DC
  Administered 2022-03-18 – 2022-03-19 (×4): 30 mL via ORAL

## 2022-03-18 MED ORDER — ALPRAZOLAM 0.25 MG PO TABS
0.2500 mg | ORAL_TABLET | Freq: Three times a day (TID) | ORAL | Status: DC
  Filled 2022-03-18: qty 1

## 2022-03-18 MED ORDER — ZOLPIDEM TARTRATE 5 MG PO TABS
5.0000 mg | ORAL_TABLET | Freq: Every day | ORAL | Status: DC
  Administered 2022-03-18: 5 mg via ORAL
  Filled 2022-03-18: qty 1

## 2022-03-18 MED ORDER — ADULT MULTIVITAMIN W/MINERALS CH
1.0000 | ORAL_TABLET | Freq: Every day | ORAL | Status: DC
  Administered 2022-03-18 – 2022-03-19 (×2): 1 via ORAL
  Filled 2022-03-18 (×2): qty 1

## 2022-03-18 MED ORDER — PANTOPRAZOLE SODIUM 40 MG PO TBEC
40.0000 mg | DELAYED_RELEASE_TABLET | Freq: Two times a day (BID) | ORAL | Status: DC
  Administered 2022-03-18 – 2022-03-19 (×2): 40 mg via ORAL
  Filled 2022-03-18 (×2): qty 1

## 2022-03-18 MED ORDER — MELATONIN 5 MG PO TABS
5.0000 mg | ORAL_TABLET | Freq: Every day | ORAL | Status: DC
  Administered 2022-03-18 (×2): 5 mg via ORAL
  Filled 2022-03-18 (×2): qty 1

## 2022-03-18 MED ORDER — ALPRAZOLAM 0.25 MG PO TABS: 0.2500 mg | ORAL_TABLET | Freq: Three times a day (TID) | ORAL | Status: DC | PRN

## 2022-03-18 MED ORDER — ENSURE ENLIVE PO LIQD
237.0000 mL | Freq: Three times a day (TID) | ORAL | Status: DC
  Administered 2022-03-18 – 2022-03-19 (×4): 237 mL via ORAL

## 2022-03-18 NOTE — Progress Notes (Signed)
Initial Nutrition Assessment  DOCUMENTATION CODES:   Non-severe (moderate) malnutrition in context of chronic illness  INTERVENTION:   -Liberalize diet to 2 gram sodium, for wider variety of meal selections (pt with history of CHF) -MVI with minerals daily -Ensure Enlive po TID, each supplement provides 350 kcal and 20 grams of protein.  -Mighty shake TID with meals, each supplement provides 220 kcals and 6 grams protein -30 ml Prosource Plus TID, each supplement provides 100 kcals and 15 grams protein  NUTRITION DIAGNOSIS:   Moderate Malnutrition related to chronic illness (COPD) as evidenced by mild fat depletion, moderate fat depletion, moderate muscle depletion, severe muscle depletion.  GOAL:   Patient will meet greater than or equal to 90% of their needs  MONITOR:   PO intake, Supplement acceptance  REASON FOR ASSESSMENT:   Malnutrition Screening Tool    ASSESSMENT:   Pt with medical history significant of HFrEF with last EF of 25-30% (2022) s/p AICD, COPD, hypertension, hyperlipidemia, polysubstance abuse, seizure disorder, who presents with complaints of shortness of breath.  Pt admitted with COPD exacerbation.    Reviewed I/O's: +356 ml x 24 hours  UOP: 200 ml x 24 hours  Spoke with pt at bedside. Pt not ver interactive with this RD and responded mainly to close ended questions. Noted breakfast tray at bedside, untouched. RD offered to help pt set up breakfast meal, however, he politely declined.   Pt reports poor appetite and weight loss PTA, however, unable to provide further insight. Pt feels more comfortable drinking liquids than eating food at this time.   Reviewed wt hx; pt has experienced a 2.6% wt loss over the past 3 months, which is not significant for time frame.   Medications reviewed and include melatonin and prednisone.   Labs reviewed: Na: 123, CBGS: 92.   NUTRITION - FOCUSED PHYSICAL EXAM:  Flowsheet Row Most Recent Value  Orbital Region  Moderate depletion  Upper Arm Region Moderate depletion  Thoracic and Lumbar Region Moderate depletion  Buccal Region Moderate depletion  Temple Region Moderate depletion  Clavicle Bone Region Severe depletion  Clavicle and Acromion Bone Region Severe depletion  Scapular Bone Region Severe depletion  Dorsal Hand Moderate depletion  Patellar Region Moderate depletion  Anterior Thigh Region Moderate depletion  Posterior Calf Region Moderate depletion  Edema (RD Assessment) None  Hair Reviewed  Eyes Reviewed  Mouth Reviewed  Skin Reviewed  Nails Reviewed       Diet Order:   Diet Order             Diet 2 gram sodium Room service appropriate? Yes; Fluid consistency: Thin  Diet effective now                   EDUCATION NEEDS:   Not appropriate for education at this time  Skin:  Skin Assessment: Reviewed RN Assessment  Last BM:  03/17/22 (type 7)  Height:   Ht Readings from Last 1 Encounters:  03/17/22 '6\' 2"'$  (1.88 m)    Weight:   Wt Readings from Last 1 Encounters:  03/17/22 78.2 kg    Ideal Body Weight:  86.4 kg  BMI:  Body mass index is 22.13 kg/m.  Estimated Nutritional Needs:   Kcal:  3382-5053  Protein:  120-135 grams  Fluid:  > 2 L    Loistine Chance, RD, LDN, [place] Registered Dietitian II Certified Diabetes Care and Education Specialist Please refer to [facility] for RD and/or RD on-call/weekend/after hours pager

## 2022-03-18 NOTE — Plan of Care (Signed)

## 2022-03-18 NOTE — Hospital Course (Signed)
72 y.o. male with medical history significant of HFrEF with last EF of 25-30% (2022) s/p AICD, COPD, hypertension, hyperlipidemia, polysubstance abuse, seizure disorder admitted for COPD exacerbation  1/19: Reports insomnia and some anxiety.  Still having difficulty with breath and cough

## 2022-03-18 NOTE — Progress Notes (Signed)
Progress Note   Patient: Jonathon Rios OEV:[id number] DOB: 1950/03/18 DOA: 03/17/2022     0 DOS: the patient was seen and examined on 03/18/2022   Brief hospital course: 72 y.o. male with medical history significant of HFrEF with last EF of 25-30% (2022) s/p AICD, COPD, hypertension, hyperlipidemia, polysubstance abuse, seizure disorder admitted for COPD exacerbation  1/19: Reports insomnia and some anxiety.  Still having difficulty with breath and cough  Assessment and Plan: * COPD with acute exacerbation [facility]) Patient presenting with 7 days of gradually worsening shortness of breath, productive cough with blood-tinged sputum, fever, chills, nausea, vomiting, diarrhea with influenza A PCR positive.  He states symptoms continue to worsen with no sign of improvement and procalcitonin is elevated concerning for a bacterial coinfection.  - Continue supplemental oxygen to maintain oxygen saturation above 88% - Wean as tolerated - S/p Solu-Medrol 125 mg once on admission - Continue prednisone 40 mg for now to complete a 5-day course - Continue Ceftriaxone and azithromycin - Hold off on further Tamiflu given patient is 7 days out from initial symptoms and risk outweighs benefit - DuoNebs every 6 hours - Continue home bronchodilators Will consider pulmonary consult if no improvement  Hematemesis Patient endorses black vomit with melena in the setting of repeated episodes of vomiting/diarrhea.  Likely Mallory-Weiss and it seems to have self resolved.  Will hold off on GI consultation unless additional episodes occur.  Hemoglobin is stable.  - Transition to Protonix 40 mg p.o. twice daily as he is tolerating diet - Monitor H&H - Zofran as needed for nausea - Continue IV fluids -Patient denies any further vomiting or diarrhea while in the hospital  Chronic HFrEF (heart failure with reduced ejection fraction) ([place]) Patient appears hypovolemic on examination with no lower extremity pitting  edema.  He has been vomiting/diarrhea for several days with poor p.o. intake.  BNP is elevated above 4500, however it appears this has been consistent and unchanged.  - Continue GDMT - Hold home Lasix and Farxiga for today as he still not eating much and does not have good appetite.   Will consult palliative care for goals of care conversation  HTN (hypertension) - Continue home antihypertensives.  CKD (chronic kidney disease) Renal function currently at baseline.  CKD stage IIIa  - Repeat BMP in the a.m.  Elevated LFTs Mildly elevated LFTs.  Potentially in the setting of viral infection versus secondary to polysubstance abuse versus chronic hepatitis C  - LFTs still trending up though not significant elevation. will need close monitoring  Demand ischemia Troponin mildly elevated at 87-> 91 likely in the setting of significant respiratory illness with increased work of breathing.  No EKG changes.  - Will trend few more troponin        Subjective: Still coughing, short of breath, poor appetite.  He is not feeling well  Physical Exam: Vitals:   03/17/22 1800 03/17/22 2130 03/18/22 0330 03/18/22 0846  BP:  (!) 143/112 134/89 (!) 129/98  Pulse:  (!) 104 90 83  Resp: '20 20 20 20  '$ Temp:  97.7 F (36.5 C) 98 F (36.7 C) 98.2 F (36.8 C)  TempSrc:  Oral Oral   SpO2: 100% 100% 100% 100%  Weight:      Height:       72 year old cachectic and chronically ill looking male lying in the bed in some respiratory distress Lungs accessory muscles of respiration in use, bilateral expiratory wheezing, no rales rhonchi Cardiovascular regular rate and rhythm Abdomen  soft, benign Neuro alert and awake, nonfocal Skin no rash or lesion Data Reviewed:  Creatinine 2.02, AST 94, ALT 96  Family Communication: None  Disposition: Status is: Observation The patient remains OBS appropriate and will d/c before 2 midnights.  Planned Discharge Destination: Home   DVT  prophylaxis-Lovenox Time spent: 35 minutes  Author: Max Sane, MD 03/18/2022 11:41 AM  For on call review [email or web address].

## 2022-03-19 DIAGNOSIS — J101 Influenza due to other identified influenza virus with other respiratory manifestations: Secondary | ICD-10-CM

## 2022-03-19 LAB — COMPREHENSIVE METABOLIC PANEL
ALT: 77 U/L — ABNORMAL HIGH (ref 0–44)
AST: 52 U/L — ABNORMAL HIGH (ref 15–41)
Albumin: 2.7 g/dL — ABNORMAL LOW (ref 3.5–5.0)
Alkaline Phosphatase: 47 U/L (ref 38–126)
Anion gap: 6 (ref 5–15)
BUN: 65 mg/dL — ABNORMAL HIGH (ref 8–23)
CO2: 24 mmol/L (ref 22–32)
Calcium: 8.1 mg/dL — ABNORMAL LOW (ref 8.9–10.3)
Chloride: 103 mmol/L (ref 98–111)
Creatinine, Ser: 1.63 mg/dL — ABNORMAL HIGH (ref 0.61–1.24)
GFR, Estimated: 45 mL/min — ABNORMAL LOW (ref >60)
Glucose, Bld: 151 mg/dL — ABNORMAL HIGH (ref 70–99)
Potassium: 4.6 mmol/L (ref 3.5–5.1)
Sodium: 133 mmol/L — ABNORMAL LOW (ref 135–145)
Total Bilirubin: 0.9 mg/dL (ref 0.3–1.2)
Total Protein: 6.6 g/dL (ref 6.5–8.1)

## 2022-03-19 LAB — CBC
HCT: 41.7 % (ref 39.0–52.0)
Hemoglobin: 13.9 g/dL (ref 13.0–17.0)
MCH: 29 pg (ref 26.0–34.0)
MCHC: 33.3 g/dL (ref 30.0–36.0)
MCV: 86.9 fL (ref 80.0–100.0)
Platelets: 103 10*3/uL — ABNORMAL LOW (ref 150–400)
RBC: 4.8 MIL/uL (ref 4.22–5.81)
RDW: 16.2 % — ABNORMAL HIGH (ref 11.5–15.5)
WBC: 8.4 10*3/uL (ref 4.0–10.5)
nRBC: 0 % (ref 0.0–0.2)

## 2022-03-19 MED ORDER — IPRATROPIUM-ALBUTEROL 0.5-2.5 (3) MG/3ML IN SOLN
3.0000 mL | Freq: Three times a day (TID) | RESPIRATORY_TRACT | Status: DC
  Administered 2022-03-19: 3 mL via RESPIRATORY_TRACT
  Filled 2022-03-19: qty 3

## 2022-03-19 MED ORDER — ZOLPIDEM TARTRATE 5 MG PO TABS: 5.0000 mg | ORAL_TABLET | Freq: Every evening | ORAL | 0 refills | Status: DC | PRN

## 2022-03-19 MED ORDER — PREDNISONE 10 MG (21) PO TBPK: ORAL_TABLET | ORAL | 0 refills | Status: DC

## 2022-03-19 NOTE — Plan of Care (Signed)

## 2022-03-20 LAB — CULTURE, RESPIRATORY W GRAM STAIN

## 2022-03-20 NOTE — Discharge Summary (Signed)
Physician Discharge Summary   Patient: Jonathon Rios MRN: [id number] DOB: [birth year]  Admit date:     03/17/2022  Discharge date: 03/19/2022  Discharge Physician: Max Sane   PCP: [name], FNP   Recommendations at discharge:   Follow-up with outpatient providers as requested  Discharge Diagnoses: Principal Problem:   COPD with acute exacerbation ([place]) Active Problems:   Hematemesis   Chronic HFrEF (heart failure with reduced ejection fraction) (H[facility])   HTN (hypertension)   CKD (chronic kidney disease)   Elevated LFTs   Demand ischemia   COPD exacerbation [facility])  Hospital Course: 72 y.o. male with medical history significant of HFrEF with last EF of 25-30% (2022) s/p AICD, COPD, hypertension, hyperlipidemia, polysubstance abuse, seizure disorder admitted for COPD exacerbation  1/19: Reports insomnia and some anxiety.  Still having difficulty with breath and cough  Assessment and Plan: * COPD with acute exacerbation [facility]) Patient presenting with 7 days of gradually worsening shortness of breath, productive cough with blood-tinged sputum, fever, chills, nausea, vomiting, diarrhea with influenza A PCR positive.  He states symptoms continue to worsen with no sign of improvement and procalcitonin is elevated concerning for a bacterial coinfection. - Improved with steroids, empiric antibiotics, nebulizer and inhalers.  Hematemesis Patient endorses black vomit with melena in the setting of repeated episodes of vomiting/diarrhea.  Likely Mallory-Weiss and it seems to have self resolved.   -No further hematemesis while in the hospital.  H&H remained stable  Chronic HFrEF (heart failure with reduced ejection fraction) (HCC) Well compensated at this time  HTN (hypertension) CKD (chronic kidney disease) 3a  Elevated LFTs Mildly elevated LFTs.  Potentially in the setting of viral infection versus secondary to polysubstance abuse versus chronic hepatitis C  Demand  ischemia Troponin mildly elevated at 87-> 91 likely in the setting of significant respiratory illness with increased work of breathing.  No EKG changes. -No MI          Disposition: Home Diet recommendation:  Discharge Diet Orders (From admission, onward)     Start     Ordered   03/19/22 0000  Diet - low sodium heart healthy        03/19/22 1047           Carb modified diet DISCHARGE MEDICATION: Allergies as of 03/19/2022       Reactions   Penicillins Hives   Has patient had a PCN reaction causing immediate rash, facial/tongue/throat swelling, SOB or lightheadedness with hypotension: Yes Has patient had a PCN reaction causing severe rash involving mucus membranes or skin necrosis: No Has patient had a PCN reaction that required hospitalization: No Has patient had a PCN reaction occurring within the last 10 years: No If all of the above answers are "NO", then may proceed with Cephalosporin use. Has patient had a PCN reaction causing immediate rash, facial/tongue/throat swelling, SOB or lightheadedness with hypotension: Yes Has patient had a PCN reaction causing severe rash involving mucus membranes or skin necrosis: No Has patient had a PCN reaction that required hospitalization: No Has patient had a PCN reaction occurring within the last 10 years: No If all of the above answers are "NO", then may proceed with Cephalosporin use. Has patient had a PCN reaction causing immediate rash, facial/tongue/throat swelling, SOB or lightheadedness with hypotension: Yes Has patient had a PCN reaction causing severe rash involving mucus membranes or skin necrosis: No Has patient had a PCN reaction that required hospitalization: No Has patient had a PCN reaction occurring within  the last 10 years: No If all of the above answers are "NO", then may proceed with Cephalosporin use.        Medication List     STOP taking these medications    multivitamin with minerals Tabs tablet    potassium chloride SA 20 MEQ tablet Commonly known as: KLOR-CON M   rosuvastatin 10 MG tablet Commonly known as: CRESTOR   sildenafil 100 MG tablet Commonly known as: VIAGRA       TAKE these medications    acetaminophen 325 MG tablet Commonly known as: TYLENOL Take 2 tablets (650 mg total) by mouth every 6 (six) hours as needed for fever.   albuterol 108 (90 Base) MCG/ACT inhaler Commonly known as: VENTOLIN HFA TAKE 2 PUFFS BY MOUTH EVERY 6 HOURS AS NEEDED FOR WHEEZE OR SHORTNESS OF BREATH   aspirin EC 81 MG tablet Take 1 tablet (81 mg total) by mouth daily.   benzonatate 100 MG capsule Commonly known as: TESSALON Take 1 capsule (100 mg total) by mouth 3 (three) times daily as needed for cough.   budesonide-formoterol 160-4.5 MCG/ACT inhaler Commonly known as: Symbicort TAKE 2 PUFFS BY MOUTH TWICE A DAY   carvedilol 25 MG tablet Commonly known as: COREG Take 1 tablet (25 mg total) by mouth 2 (two) times daily.   clotrimazole 1 % cream Commonly known as: LOTRIMIN APPLY TO AFFECTED AREA TWICE A DAY   Entresto 97-103 MG Generic drug: sacubitril-valsartan TAKE 1 TABLET BY MOUTH TWICE A DAY   Farxiga 10 MG Tabs tablet Generic drug: dapagliflozin propanediol TAKE 1 TABLET BY MOUTH EVERY DAY BEFORE BREAKFAST   feeding supplement Liqd Take 237 mLs by mouth 3 (three) times daily between meals.   furosemide 40 MG tablet Commonly known as: LASIX Take 2 tablets (80 mg total) by mouth daily.   hydrocortisone 2.5 % cream Apply topically to face qhs t thur and Saturday for rash   ipratropium-albuterol 0.5-2.5 (3) MG/3ML Soln Commonly known as: DUONEB Inhale 3 mLs into the lungs every 4 (four) hours as needed.   ketoconazole 2 % cream Commonly known as: NIZORAL Apply topically to face qhs on m w Friday for rash   predniSONE 10 MG (21) Tbpk tablet Commonly known as: STERAPRED UNI-PAK 21 TAB Start 60 mg po daily, taper 10 mg daily until finish   tamsulosin 0.4  MG Caps capsule Commonly known as: FLOMAX TAKE 1 CAPSULE BY MOUTH EVERY DAY   triamcinolone 0.025 % cream Commonly known as: KENALOG Apply 1 application. topically 2 (two) times daily. Do not use longer than 7 days due to skin discoloration   zolpidem 5 MG tablet Commonly known as: AMBIEN Take 1 tablet (5 mg total) by mouth at bedtime as needed for sleep.        Follow-up Information     Arnett, Yvetta Coder, FNP. Schedule an appointment as soon as possible for a visit in 3 day(s).   Specialty: Family Medicine Why: [facility] Discharge F/UP Contact information: [place] [address] [phone]                Discharge Exam: Danley Danker Weights   03/17/22 1045  Weight: 2.86 kg   72 year old cachectic and chronically ill looking male lying in the bed in some respiratory distress Lungs accessory muscles of respiration in use, bilateral expiratory wheezing, no rales rhonchi Cardiovascular regular rate and rhythm Abdomen soft, benign Neuro alert and awake, nonfocal Skin no rash or lesion  Condition  at discharge: good  The results of significant diagnostics from this hospitalization (including imaging, microbiology, ancillary and laboratory) are listed below for reference.   Imaging Studies: CT Angio Chest PE W and/or Wo Contrast  Result Date: 03/17/2022 CLINICAL DATA:  PE suspected, worsening shortness of breath COPD, hemoptysis EXAM: CT ANGIOGRAPHY CHEST WITH CONTRAST TECHNIQUE: Multidetector CT imaging of the chest was performed using the standard protocol during bolus administration of intravenous contrast. Multiplanar CT image reconstructions and MIPs were obtained to evaluate the vascular anatomy. RADIATION DOSE REDUCTION: This exam was performed according to the departmental dose-optimization program which includes automated exposure control, adjustment of the mA and/or kV according to patient size and/or use of iterative reconstruction  technique. CONTRAST:  61m OMNIPAQUE IOHEXOL 350 MG/ML SOLN COMPARISON:  None Available. FINDINGS: Cardiovascular: Satisfactory opacification of the pulmonary arteries to the segmental level. No evidence of pulmonary embolism. Cardiomegaly. No pericardial effusion. Scattered aortic atherosclerosis. Mediastinum/Nodes: No enlarged mediastinal, hilar, or axillary lymph nodes. Thyroid gland, trachea, and esophagus demonstrate no significant findings. Lungs/Pleura: Moderate centrilobular and paraseptal emphysema. Diffuse bilateral bronchial wall thickening. No pleural effusion or pneumothorax. Upper Abdomen: No acute abnormality. Musculoskeletal: No chest wall abnormality. No acute osseous findings. Review of the MIP images confirms the above findings. IMPRESSION: 1. Negative examination for pulmonary embolism. 2. Moderate emphysema and diffuse bilateral bronchial wall thickening consistent with nonspecific infectious or inflammatory bronchitis. 3. Cardiomegaly. Aortic Atherosclerosis (ICD10-I70.0) and Emphysema (ICD10-J43.9). Electronically Signed   By: ADelanna AhmadiM.D.   On: 03/17/2022 12:21   DG Chest Portable 1 View  Result Date: 03/17/2022 CLINICAL DATA:  Shortness of breath worsening since yesterday. Wheezing and hemoptysis. EXAM: PORTABLE CHEST 1 VIEW COMPARISON:  Chest radiograph 01/04/2022 FINDINGS: The left chest wall cardiac device and associated leads are stable. The heart is enlarged, unchanged. The upper mediastinal contours are normal. There is vascular congestion without definite overt pulmonary edema. There is no focal airspace opacity. There is no pleural effusion or pneumothorax There is no acute osseous abnormality. IMPRESSION: Cardiomegaly with vascular congestion but no definite overt pulmonary edema. No focal consolidation or pleural effusion. Electronically Signed   By: PValetta MoleM.D.   On: 03/17/2022 11:28    Microbiology: Results for orders placed or performed during the hospital  encounter of 03/17/22  Resp panel by RT-PCR (RSV, Flu A&B, Covid) Anterior Nasal Swab     Status: Abnormal   Collection Time: 03/17/22 10:54 AM   Specimen: Anterior Nasal Swab  Result Value Ref Range Status   SARS Coronavirus 2 by RT PCR NEGATIVE NEGATIVE Final    Comment: (NOTE) SARS-CoV-2 target nucleic acids are NOT DETECTED.  The SARS-CoV-2 RNA is generally detectable in upper respiratory specimens during the acute phase of infection. The lowest concentration of SARS-CoV-2 viral copies this assay can detect is 138 copies/mL. A negative result does not preclude SARS-Cov-2 infection and should not be used as the sole basis for treatment or other patient management decisions. A negative result may occur with  improper specimen collection/handling, submission of specimen other than nasopharyngeal swab, presence of viral mutation(s) within the areas targeted by this assay, and inadequate number of viral copies(<138 copies/mL). A negative result must be combined with clinical observations, patient history, and epidemiological information. The expected result is Negative.  Fact Sheet for Patients:  hEntrepreneurPulse.com.au Fact Sheet for Healthcare Providers:  hIncredibleEmployment.be This test is no t yet approved or cleared by the [place] and  has been authorized for detection and/or  diagnosis of SARS-CoV-2 by FDA under an Emergency Use Authorization (EUA). This EUA will remain  in effect (meaning this test can be used) for the duration of the COVID-19 declaration under Section 564(b)(1) of the Act, 21 U.S.C.section 360bbb-3(b)(1), unless the authorization is terminated  or revoked sooner.       Influenza A by PCR POSITIVE (A) NEGATIVE Final   Influenza B by PCR NEGATIVE NEGATIVE Final    Comment: (NOTE) The Xpert Xpress SARS-CoV-2/FLU/RSV plus assay is intended as an aid in the diagnosis of influenza from Nasopharyngeal swab  specimens and should not be used as a sole basis for treatment. Nasal washings and aspirates are unacceptable for Xpert Xpress SARS-CoV-2/FLU/RSV testing.  Fact Sheet for Patients: EntrepreneurPulse.com.au  Fact Sheet for Healthcare Providers: IncredibleEmployment.be  This test is not yet approved or cleared by the [place] FDA and has been authorized for detection and/or diagnosis of SARS-CoV-2 by FDA under an Emergency Use Authorization (EUA). This EUA will remain in effect (meaning this test can be used) for the duration of the COVID-19 declaration under Section 564(b)(1) of the Act, 21 U.S.C. section 360bbb-3(b)(1), unless the authorization is terminated or revoked.     Resp Syncytial Virus by PCR NEGATIVE NEGATIVE Final    Comment: (NOTE) Fact Sheet for Patients: EntrepreneurPulse.com.au  Fact Sheet for Healthcare Providers: IncredibleEmployment.be  This test is not yet approved or cleared by the [place] FDA and has been authorized for detection and/or diagnosis of SARS-CoV-2 by FDA under an Emergency Use Authorization (EUA). This EUA will remain in effect (meaning this test can be used) for the duration of the COVID-19 declaration under Section 564(b)(1) of the Act, 21 U.S.C. section 360bbb-3(b)(1), unless the authorization is terminated or revoked.  Performed at [facility], [address] [place], [place] [address]   Expectorated Sputum Assessment w Gram Stain, Rflx to Resp Cult     Status: None   Collection Time: 03/17/22  7:00 PM   Specimen: Expectorated Sputum  Result Value Ref Range Status   Specimen Description EXPECTORATED SPUTUM  Final   Special Requests NONE  Final   Sputum evaluation   Final    THIS SPECIMEN IS ACCEPTABLE FOR SPUTUM CULTURE Performed at [facility], [address] [place], [place] [address]    Report Status 03/17/2022 FINAL  Final   Culture, Respiratory w Gram Stain     Status: None   Collection Time: 03/17/22  7:00 PM  Result Value Ref Range Status   Specimen Description   Final    EXPECTORATED SPUTUM Performed at [facility], [address] [place], [place] [address]    Special Requests   Final    NONE Reflexed from [id number] Performed at [facility], [address] [place], [place] [address]    Gram Stain   Final    FEW WBC PRESENT, PREDOMINANTLY MONONUCLEAR FEW GRAM POSITIVE COCCI IN PAIRS    Culture   Final    MODERATE MORAXELLA CATARRHALIS(BRANHAMELLA) BETA LACTAMASE NEGATIVE Performed at [facility], [place] [address], [place],  [address]    Report Status 03/20/2022 FINAL  Final    Labs: CBC: Recent Labs  Lab 03/17/22 1054 03/18/22 0415 03/19/22 0328  WBC 8.5 9.2 8.4  NEUTROABS 7.2 7.7  --   HGB 14.1 14.0 13.9  HCT 42.8 42.6 41.7  MCV 87.9 88.8 86.9  PLT 91* 94* 053*   Basic Metabolic Panel: Recent Labs  Lab 03/17/22 1054 03/18/22 0415 03/19/22  0328  NA 135 132* 133*  K 4.2 4.8 4.6  CL 102 100 103  CO2 '23 25 24  '$ GLUCOSE 124* 117* 151*  BUN 45* 56* 65*  CREATININE 1.91* 2.02* 1.63*  CALCIUM 8.7* 8.3* 8.1*   Liver Function Tests: Recent Labs  Lab 03/17/22 1054 03/18/22 0415 03/19/22 0328  AST 65* 94* 52*  ALT 51* 96* 77*  ALKPHOS 47 47 47  BILITOT 2.1* 1.5* 0.9  PROT 7.6 7.3 6.6  ALBUMIN 3.7 3.3* 2.7*   CBG: No results for input(s): "GLUCAP" in the last 168 hours.  Discharge time spent: greater than 30 minutes.  Signed: Max Sane, MD [facility] 03/20/2022

## 2022-03-21 ENCOUNTER — Telehealth: Payer: Self-pay | Admitting: *Deleted

## 2022-03-21 NOTE — Patient Outreach (Addendum)
  Care Coordination [facility] Note Transition Care Management Follow-up Telephone Call Date of discharge and from where: [facility] [phone] COPD How have you been since you were released from the hospital? I still have the flu Any questions or concerns? No  Items Reviewed: Did the pt receive and understand the discharge instructions provided? Yes  Medications obtained and verified? Yes  Other? No  Any new allergies since your discharge? No  Dietary orders reviewed? No Do you have support at home? Yes   Home Care and Equipment/Supplies: Were home health services ordered? not applicable If so, what is the name of the agency? N/a  Has the agency set up a time to come to the patient's home? not applicable Were any new equipment or medical supplies ordered?  No What is the name of the medical supply agency? N  Were you able to get the supplies/equipment? not applicable Do you have any questions related to the use of the equipment or supplies? No  Functional Questionnaire: (I = Independent and D = Dependent) ADLs: I  Bathing/Dressing- I  Meal Prep- I  Eating- I  Maintaining continence- I  Transferring/Ambulation- I  Managing Meds- I  Follow up appointments reviewed:  PCP Hospital f/u appt confirmed? Yes  Per patient he has an appointment with [facility] [phone]. He stated he does not see Mable Paris FNP anymore [facility] f/u appt confirmed? No  Scheduled  for remote pacer checks Are transportation arrangements needed? No  If their condition worsens, is the pt aware to call PCP or go to the Emergency Dept.? Yes Was the patient provided with contact information for the PCP's office or ED? Yes Was to pt encouraged to call back with questions or concerns? Yes  SDOH assessments and interventions completed:   Yes SDOH Interventions Today    Flowsheet Row Most Recent Value  SDOH Interventions   Food Insecurity Interventions Intervention Not Indicated       Care  Coordination Interventions:  RN discussed urgent care visit if needed . RN attempted discussion on [facility] meal plan and patient stated he knows.   Encounter Outcome:  Pt. Visit Completed    [facility] [phone]

## 2022-03-22 ENCOUNTER — Ambulatory Visit (INDEPENDENT_AMBULATORY_CARE_PROVIDER_SITE_OTHER): Payer: 59 | Admitting: Dermatology

## 2022-03-22 DIAGNOSIS — L219 Seborrheic dermatitis, unspecified: Secondary | ICD-10-CM

## 2022-03-22 DIAGNOSIS — D485 Neoplasm of uncertain behavior of skin: Secondary | ICD-10-CM

## 2022-03-22 DIAGNOSIS — Z79899 Other long term (current) drug therapy: Secondary | ICD-10-CM

## 2022-03-22 NOTE — Patient Instructions (Addendum)
Wound Care Instructions  Cleanse wound gently with soap and water once a day then pat dry with clean gauze. Apply a thin coat of Petrolatum (petroleum jelly, "Vaseline") over the wound (unless you have an allergy to this). We recommend that you use a new, sterile tube of Vaseline. Do not pick or remove scabs. Do not remove the yellow or white "healing tissue" from the base of the wound.  Cover the wound with fresh, clean, nonstick gauze and secure with paper tape. You may use Band-Aids in place of gauze and tape if the wound is small enough, but would recommend trimming much of the tape off as there is often too much. Sometimes Band-Aids can irritate the skin.  You should call the office for your biopsy report after 1 week if you have not already been contacted.  If you experience any problems, such as abnormal amounts of bleeding, swelling, significant bruising, significant pain, or evidence of infection, please call the office immediately.  FOR ADULT SURGERY PATIENTS: If you need something for pain relief you may take 1 extra strength Tylenol (acetaminophen) AND 2 Ibuprofen (200mg each) together every 4 hours as needed for pain. (do not take these if you are allergic to them or if you have a reason you should not take them.) Typically, you may only need pain medication for 1 to 3 days.     Due to recent changes in healthcare laws, you may see results of your pathology and/or laboratory studies on MyChart before the doctors have had a chance to review them. We understand that in some cases there may be results that are confusing or concerning to you. Please understand that not all results are received at the same time and often the doctors may need to interpret multiple results in order to provide you with the best plan of care or course of treatment. Therefore, we ask that you please give us 2 business days to thoroughly review all your results before contacting the office for clarification. Should  we see a critical lab result, you will be contacted sooner.   If You Need Anything After Your Visit  If you have any questions or concerns for your doctor, please call our main line at [phone] and press option 4 to reach your doctor's medical assistant. If no one answers, please leave a voicemail as directed and we will return your call as soon as possible. Messages left after 4 pm will be answered the following business day.   You may also send us a message via MyChart. We typically respond to MyChart messages within 1-2 business days.  For prescription refills, please ask your pharmacy to contact our office. Our fax number is [phone].  If you have an urgent issue when the clinic is closed that cannot wait until the next business day, you can page your doctor at the number below.    Please note that while we do our best to be available for urgent issues outside of office hours, we are not available 24/7.   If you have an urgent issue and are unable to reach us, you may choose to seek medical care at your doctor's office, retail clinic, urgent care center, or emergency room.  If you have a medical emergency, please immediately call 911 or go to the emergency department.  Pager Numbers  - Dr. Kowalski: [phone]  - Dr. Moye: [phone]  - Dr. Stewart: [phone]  In the event of inclement weather, please call our main line at   [phone] for an update on the status of any delays or closures.  Dermatology Medication Tips: Please keep the boxes that topical medications come in in order to help keep track of the instructions about where and how to use these. Pharmacies typically print the medication instructions only on the boxes and not directly on the medication tubes.   If your medication is too expensive, please contact our office at [phone] option 4 or send us a message through MyChart.   We are unable to tell what your co-pay for medications will be in  advance as this is different depending on your insurance coverage. However, we may be able to find a substitute medication at lower cost or fill out paperwork to get insurance to cover a needed medication.   If a prior authorization is required to get your medication covered by your insurance company, please allow us 1-2 business days to complete this process.  Drug prices often vary depending on where the prescription is filled and some pharmacies may offer cheaper prices.  The website www.goodrx.com contains coupons for medications through different pharmacies. The prices here do not account for what the cost may be with help from insurance (it may be cheaper with your insurance), but the website can give you the price if you did not use any insurance.  - You can print the associated coupon and take it with your prescription to the pharmacy.  - You may also stop by our office during regular business hours and pick up a GoodRx coupon card.  - If you need your prescription sent electronically to a different pharmacy, notify our office through [facility] MyChart or by phone at [phone] option 4.     Si Usted Necesita Algo Despus de Su Visita  Tambin puede enviarnos un mensaje a travs de MyChart. Por lo general respondemos a los mensajes de MyChart en el transcurso de 1 a 2 das hbiles.  Para renovar recetas, por favor pida a su farmacia que se ponga en contacto con nuestra oficina. Nuestro nmero de fax es el [phone].  Si tiene un asunto urgente cuando la clnica est cerrada y que no puede esperar hasta el siguiente da hbil, puede llamar/localizar a su doctor(a) al nmero que aparece a continuacin.   Por favor, tenga en cuenta que aunque hacemos todo lo posible para estar disponibles para asuntos urgentes fuera del horario de oficina, no estamos disponibles las 24 horas del da, los 7 das de la semana.   Si tiene un problema urgente y no puede comunicarse con nosotros, puede  optar por buscar atencin mdica  en el consultorio de su doctor(a), en una clnica privada, en un centro de atencin urgente o en una sala de emergencias.  Si tiene una emergencia mdica, por favor llame inmediatamente al 911 o vaya a la sala de emergencias.  Nmeros de bper  - Dr. Kowalski: [phone]  - Dra. Moye: [phone]  - Dra. Stewart: [phone]  En caso de inclemencias del tiempo, por favor llame a nuestra lnea principal al [phone] para una actualizacin sobre el estado de cualquier retraso o cierre.  Consejos para la medicacin en dermatologa: Por favor, guarde las cajas en las que vienen los medicamentos de uso tpico para ayudarle a seguir las instrucciones sobre dnde y cmo usarlos. Las farmacias generalmente imprimen las instrucciones del medicamento slo en las cajas y no directamente en los tubos del medicamento.   Si su medicamento es muy caro, por favor, pngase en contacto con   nuestra oficina llamando al [phone] y presione la opcin 4 o envenos un mensaje a travs de MyChart.   No podemos decirle cul ser su copago por los medicamentos por adelantado ya que esto es diferente dependiendo de la cobertura de su seguro. Sin embargo, es posible que podamos encontrar un medicamento sustituto a menor costo o llenar un formulario para que el seguro cubra el medicamento que se considera necesario.   Si se requiere una autorizacin previa para que su compaa de seguros cubra su medicamento, por favor permtanos de 1 a 2 das hbiles para completar este proceso.  Los precios de los medicamentos varan con frecuencia dependiendo del lugar de dnde se surte la receta y alguna farmacias pueden ofrecer precios ms baratos.  El sitio web www.goodrx.com tiene cupones para medicamentos de diferentes farmacias. Los precios aqu no tienen en cuenta lo que podra costar con la ayuda del seguro (puede ser ms barato con su seguro), pero el sitio web puede darle el  precio si no utiliz ningn seguro.  - Puede imprimir el cupn correspondiente y llevarlo con su receta a la farmacia.  - Tambin puede pasar por nuestra oficina durante el horario de atencin regular y recoger una tarjeta de cupones de GoodRx.  - Si necesita que su receta se enve electrnicamente a una farmacia diferente, informe a nuestra oficina a travs de MyChart de [facility] o por telfono llamando al [phone] y presione la opcin 4.  

## 2022-03-22 NOTE — Progress Notes (Signed)
   Follow-Up Visit   Subjective  Jonathon Rios is a 72 y.o. male who presents for the following: Irregular skin lesion  (Nose - patient would like removed today ) and Seborrheic dermatitis (Of the face - prescribed Ketoconazole 2% cream and HC 2.5%, patient is unsure which one he is using, but he has seen an improvement in condition).  The following portions of the chart were reviewed this encounter and updated as appropriate:   Tobacco  Allergies  Meds  Problems  Med Hx  Surg Hx  Fam Hx     Review of Systems:  No other skin or systemic complaints except as noted in HPI or Assessment and Plan.  Objective  Well appearing patient in no apparent distress; mood and affect are within normal limits.  A focused examination was performed including the face. Relevant physical exam findings are noted in the Assessment and Plan.  Nose 1.1 cm dark brown papule.  Face Pink patches with greasy scale.    Assessment & Plan  Neoplasm of uncertain behavior of skin Nose Epidermal / dermal shaving  Lesion diameter (cm):  1.1 Informed consent: discussed and consent obtained   Timeout: patient name, date of birth, surgical site, and procedure verified   Procedure prep:  Patient was prepped and draped in usual sterile fashion Prep type:  Isopropyl alcohol Anesthesia: the lesion was anesthetized in a standard fashion   Anesthetic:  1% lidocaine w/ epinephrine 1-100,000 buffered w/ 8.4% NaHCO3 Instrument used: flexible razor blade   Hemostasis achieved with: pressure, aluminum chloride and electrodesiccation   Outcome: patient tolerated procedure well   Post-procedure details: sterile dressing applied and wound care instructions given   Dressing type: bandage and petrolatum    Specimen 1 - Surgical pathology Differential Diagnosis: D48.5 irritated nevus vs hemangioma r/o dysplasia Check Margins: No  Seborrheic dermatitis Face Seborrheic Dermatitis  -  is a chronic persistent rash  characterized by pinkness and scaling most commonly of the mid face but also can occur on the scalp (dandruff), ears; mid chest, mid back and groin.  It tends to be exacerbated by stress and cooler weather.  People who have neurologic disease may experience new onset or exacerbation of existing seborrheic dermatitis.  The condition is not curable but treatable and can be controlled.   Related Medications ketoconazole (NIZORAL) 2 % cream Apply topically to face qhs on m w Friday for rash  hydrocortisone 2.5 % cream Apply topically to face qhs t thur and Saturday for rash  Return in 3 months (on 06/21/2022), or if symptoms worsen or fail to improve.  Jonathon Rios, CMA, am acting as scribe for Sarina Ser, MD . Documentation: I have reviewed the above documentation for accuracy and completeness, and I agree with the above.  Sarina Ser, MD

## 2022-03-28 ENCOUNTER — Telehealth: Payer: Self-pay

## 2022-03-28 NOTE — Telephone Encounter (Signed)
Advised pt of bx result/sh ?

## 2022-03-28 NOTE — Telephone Encounter (Signed)
-----  Message from Ralene Bathe, MD sent at 03/28/2022  1:33 PM EST ----- Diagnosis Skin , nose ARTERIOVENOUS HEMANGIOMA AND EPIDERMOID CYST  Benign cyst with associated hemangioma (collection of blood vessels) No further treatment needed at this time

## 2022-03-30 ENCOUNTER — Encounter: Payer: Self-pay | Admitting: Dermatology

## 2022-04-07 ENCOUNTER — Ambulatory Visit: Payer: 59 | Admitting: Cardiovascular Disease
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
## 2022-04-08 ENCOUNTER — Encounter: Payer: Self-pay | Admitting: Cardiovascular Disease

## 2022-04-10 ENCOUNTER — Other Ambulatory Visit: Payer: Self-pay | Admitting: Family

## 2022-04-10 DIAGNOSIS — J449 Chronic obstructive pulmonary disease, unspecified: Secondary | ICD-10-CM

## 2022-05-04 ENCOUNTER — Other Ambulatory Visit: Payer: Self-pay | Admitting: Family

## 2022-05-04 DIAGNOSIS — J449 Chronic obstructive pulmonary disease, unspecified: Secondary | ICD-10-CM

## 2022-05-05 ENCOUNTER — Encounter: Payer: Self-pay | Admitting: Cardiovascular Disease

## 2022-05-05 ENCOUNTER — Ambulatory Visit: Payer: 59 | Attending: Cardiovascular Disease | Admitting: Cardiovascular Disease

## 2022-05-05 VITALS — BP 142/102 | HR 80 | Ht 75.0 in | Wt 179.6 lb

## 2022-05-05 DIAGNOSIS — I10 Essential (primary) hypertension: Secondary | ICD-10-CM | POA: Diagnosis not present

## 2022-05-05 DIAGNOSIS — I5022 Chronic systolic (congestive) heart failure: Secondary | ICD-10-CM | POA: Diagnosis not present

## 2022-05-05 DIAGNOSIS — I493 Ventricular premature depolarization: Secondary | ICD-10-CM

## 2022-05-05 DIAGNOSIS — J449 Chronic obstructive pulmonary disease, unspecified: Secondary | ICD-10-CM

## 2022-05-05 DIAGNOSIS — Z720 Tobacco use: Secondary | ICD-10-CM

## 2022-05-05 NOTE — Patient Instructions (Signed)
Medication Instructions:  No changes *If you need a refill on your cardiac medications before your next appointment, please call your pharmacy*   Lab Work: None ordered If you have labs (blood work) drawn today and your tests are completely normal, you will receive your results only by: [place] (if you have MyChart) OR A paper copy in the mail If you have any lab test that is abnormal or we need to change your treatment, we will call you to review the results.   Testing/Procedures: None ordered   Follow-Up: At [facility], you and your health needs are our priority.  As part of our continuing mission to provide you with exceptional heart care, we have created designated Provider Care Teams.  These Care Teams include your primary Cardiologist (physician) and Advanced Practice Providers (APPs -  Physician Assistants and Nurse Practitioners) who all work together to provide you with the care you need, when you need it.  We recommend signing up for the patient portal called "MyChart".  Sign up information is provided on this After Visit Summary.  MyChart is used to connect with patients for Virtual Visits (Telemedicine).  Patients are able to view lab/test results, encounter notes, upcoming appointments, etc.  Non-urgent messages can be sent to your provider as well.   To learn more about what you can do with MyChart, go to [email or web address].    Your next appointment:   6 month(s)  Provider:   You may see [name], MD or one of the following Advanced Practice Providers on your designated Care Team:   Murray Hodgkins, NP [name], PA-C Cadence Kathlen Mody, PA-C Gerrie Nordmann, NP
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
## 2022-05-17 ENCOUNTER — Other Ambulatory Visit: Payer: Self-pay | Admitting: Family

## 2022-05-17 DIAGNOSIS — J42 Unspecified chronic bronchitis: Secondary | ICD-10-CM

## 2022-05-17 MED ORDER — ALBUTEROL SULFATE HFA 108 (90 BASE) MCG/ACT IN AERS: 2.0000 | INHALATION_SPRAY | Freq: Four times a day (QID) | RESPIRATORY_TRACT | 2 refills | Status: DC | PRN

## 2022-05-23 ENCOUNTER — Telehealth: Payer: Self-pay | Admitting: Cardiovascular Disease

## 2022-05-23 NOTE — Telephone Encounter (Signed)
Patient states during his appointment on 05/05/22 paperwork was dropped off. He states he has to turn it in soon and he would like to know if it has been completed.

## 2022-05-25 NOTE — Telephone Encounter (Signed)
Attempted to reach the patient. Message on the voicemail did not match the patient's name. Voicemail was not left.   Paperwork is available for pick up at the front desk.

## 2022-05-25 NOTE — Telephone Encounter (Signed)
I signed the papers and placed in my out basket.  Thanks.

## 2022-05-27 NOTE — Telephone Encounter (Signed)
Paperwork has been picked up and copy sent to be scanned.

## 2022-06-04 ENCOUNTER — Inpatient Hospital Stay: Payer: 59

## 2022-06-04 ENCOUNTER — Inpatient Hospital Stay
Admission: EM | Admit: 2022-06-04 | Discharge: 2022-06-07 | DRG: 190 | Disposition: A | Discharge status: Home / Self Care | Payer: 59 | Attending: Internal Medicine | Admitting: Internal Medicine

## 2022-06-04 ENCOUNTER — Other Ambulatory Visit: Payer: Self-pay

## 2022-06-04 ENCOUNTER — Emergency Department: Payer: 59

## 2022-06-04 DIAGNOSIS — Z7982 Long term (current) use of aspirin: Secondary | ICD-10-CM

## 2022-06-04 DIAGNOSIS — J810 Acute pulmonary edema: Secondary | ICD-10-CM

## 2022-06-04 DIAGNOSIS — Z95810 Presence of automatic (implantable) cardiac defibrillator: Secondary | ICD-10-CM

## 2022-06-04 DIAGNOSIS — Z87891 Personal history of nicotine dependence: Secondary | ICD-10-CM

## 2022-06-04 DIAGNOSIS — Z1152 Encounter for screening for COVID-19: Secondary | ICD-10-CM

## 2022-06-04 DIAGNOSIS — N1831 Chronic kidney disease, stage 3a: Secondary | ICD-10-CM | POA: Diagnosis not present

## 2022-06-04 DIAGNOSIS — J441 Chronic obstructive pulmonary disease with (acute) exacerbation: Secondary | ICD-10-CM | POA: Diagnosis not present

## 2022-06-04 DIAGNOSIS — N1832 Chronic kidney disease, stage 3b: Secondary | ICD-10-CM | POA: Diagnosis present

## 2022-06-04 DIAGNOSIS — E785 Hyperlipidemia, unspecified: Secondary | ICD-10-CM | POA: Diagnosis present

## 2022-06-04 DIAGNOSIS — J189 Pneumonia, unspecified organism: Secondary | ICD-10-CM | POA: Diagnosis present

## 2022-06-04 DIAGNOSIS — Z79899 Other long term (current) drug therapy: Secondary | ICD-10-CM | POA: Diagnosis not present

## 2022-06-04 DIAGNOSIS — I5023 Acute on chronic systolic (congestive) heart failure: Secondary | ICD-10-CM | POA: Diagnosis present

## 2022-06-04 DIAGNOSIS — I130 Hypertensive heart and chronic kidney disease with heart failure and stage 1 through stage 4 chronic kidney disease, or unspecified chronic kidney disease: Secondary | ICD-10-CM | POA: Diagnosis present

## 2022-06-04 DIAGNOSIS — N179 Acute kidney failure, unspecified: Secondary | ICD-10-CM | POA: Diagnosis present

## 2022-06-04 DIAGNOSIS — J069 Acute upper respiratory infection, unspecified: Secondary | ICD-10-CM | POA: Diagnosis present

## 2022-06-04 DIAGNOSIS — B348 Other viral infections of unspecified site: Secondary | ICD-10-CM | POA: Diagnosis not present

## 2022-06-04 DIAGNOSIS — B182 Chronic viral hepatitis C: Secondary | ICD-10-CM | POA: Diagnosis present

## 2022-06-04 DIAGNOSIS — I5022 Chronic systolic (congestive) heart failure: Secondary | ICD-10-CM | POA: Diagnosis present

## 2022-06-04 DIAGNOSIS — I428 Other cardiomyopathies: Secondary | ICD-10-CM | POA: Diagnosis present

## 2022-06-04 DIAGNOSIS — J9621 Acute and chronic respiratory failure with hypoxia: Secondary | ICD-10-CM | POA: Diagnosis present

## 2022-06-04 DIAGNOSIS — I5031 Acute diastolic (congestive) heart failure: Secondary | ICD-10-CM | POA: Diagnosis not present

## 2022-06-04 DIAGNOSIS — I272 Pulmonary hypertension, unspecified: Secondary | ICD-10-CM | POA: Diagnosis present

## 2022-06-04 DIAGNOSIS — Z8674 Personal history of sudden cardiac arrest: Secondary | ICD-10-CM

## 2022-06-04 DIAGNOSIS — F4310 Post-traumatic stress disorder, unspecified: Secondary | ICD-10-CM | POA: Diagnosis present

## 2022-06-04 DIAGNOSIS — J9622 Acute and chronic respiratory failure with hypercapnia: Secondary | ICD-10-CM | POA: Diagnosis present

## 2022-06-04 DIAGNOSIS — F331 Major depressive disorder, recurrent, moderate: Secondary | ICD-10-CM | POA: Diagnosis present

## 2022-06-04 DIAGNOSIS — Z7951 Long term (current) use of inhaled steroids: Secondary | ICD-10-CM

## 2022-06-04 DIAGNOSIS — I2510 Atherosclerotic heart disease of native coronary artery without angina pectoris: Secondary | ICD-10-CM | POA: Diagnosis present

## 2022-06-04 DIAGNOSIS — B9789 Other viral agents as the cause of diseases classified elsewhere: Secondary | ICD-10-CM | POA: Diagnosis present

## 2022-06-04 DIAGNOSIS — J439 Emphysema, unspecified: Secondary | ICD-10-CM | POA: Diagnosis present

## 2022-06-04 DIAGNOSIS — I255 Ischemic cardiomyopathy: Secondary | ICD-10-CM | POA: Diagnosis present

## 2022-06-04 DIAGNOSIS — I10 Essential (primary) hypertension: Secondary | ICD-10-CM | POA: Diagnosis present

## 2022-06-04 DIAGNOSIS — Z825 Family history of asthma and other chronic lower respiratory diseases: Secondary | ICD-10-CM

## 2022-06-04 DIAGNOSIS — J9602 Acute respiratory failure with hypercapnia: Principal | ICD-10-CM

## 2022-06-04 DIAGNOSIS — I5020 Unspecified systolic (congestive) heart failure: Secondary | ICD-10-CM | POA: Diagnosis not present

## 2022-06-04 DIAGNOSIS — R509 Fever, unspecified: Secondary | ICD-10-CM

## 2022-06-04 DIAGNOSIS — G40909 Epilepsy, unspecified, not intractable, without status epilepticus: Secondary | ICD-10-CM | POA: Diagnosis present

## 2022-06-04 DIAGNOSIS — R058 Other specified cough: Secondary | ICD-10-CM

## 2022-06-04 DIAGNOSIS — Z880 Allergy status to penicillin: Secondary | ICD-10-CM

## 2022-06-04 HISTORY — DX: Acute and chronic respiratory failure with hypercapnia: J96.21

## 2022-06-04 LAB — URINALYSIS, COMPLETE (UACMP) WITH MICROSCOPIC
Bacteria, UA: NONE SEEN
Bilirubin Urine: NEGATIVE
Glucose, UA: >=500 mg/dL — AB
Ketones, ur: NEGATIVE mg/dL
Leukocytes,Ua: NEGATIVE
Nitrite: NEGATIVE
Protein, ur: >=300 mg/dL — AB
Specific Gravity, Urine: 1.021 (ref 1.005–1.030)
pH: 5 (ref 5.0–8.0)

## 2022-06-04 LAB — CBC WITH DIFFERENTIAL/PLATELET
Abs Immature Granulocytes: 0.03 10*3/uL (ref 0.00–0.07)
Basophils Absolute: 0 10*3/uL (ref 0.0–0.1)
Basophils Relative: 0 %
Eosinophils Absolute: 0 10*3/uL (ref 0.0–0.5)
Eosinophils Relative: 0 %
HCT: 51 % (ref 39.0–52.0)
Hemoglobin: 16.3 g/dL (ref 13.0–17.0)
Immature Granulocytes: 0 %
Lymphocytes Relative: 10 %
Lymphs Abs: 1 10*3/uL (ref 0.7–4.0)
MCH: 29.5 pg (ref 26.0–34.0)
MCHC: 32 g/dL (ref 30.0–36.0)
MCV: 92.4 fL (ref 80.0–100.0)
Monocytes Absolute: 0.6 10*3/uL (ref 0.1–1.0)
Monocytes Relative: 6 %
Neutro Abs: 8.2 10*3/uL — ABNORMAL HIGH (ref 1.7–7.7)
Neutrophils Relative %: 84 %
Platelets: 153 10*3/uL (ref 150–400)
RBC: 5.52 MIL/uL (ref 4.22–5.81)
RDW: 14.5 % (ref 11.5–15.5)
WBC: 9.9 10*3/uL (ref 4.0–10.5)
nRBC: 0 % (ref 0.0–0.2)

## 2022-06-04 LAB — BLOOD GAS, ARTERIAL
Acid-base deficit: 0.1 mmol/L (ref 0.0–2.0)
Bicarbonate: 26.4 mmol/L (ref 20.0–28.0)
Delivery systems: POSITIVE
Expiratory PAP: 6 cmH2O
FIO2: 45 %
Inspiratory PAP: 14 cmH2O
Mechanical Rate: 12
O2 Saturation: 100 %
Patient temperature: 38.2
pCO2 arterial: 52 mmHg — ABNORMAL HIGH (ref 32–48)
pH, Arterial: 7.32 — ABNORMAL LOW (ref 7.35–7.45)
pO2, Arterial: 162 mmHg — ABNORMAL HIGH (ref 83–108)

## 2022-06-04 LAB — BLOOD GAS, VENOUS
Acid-Base Excess: 2 mmol/L (ref 0.0–2.0)
Bicarbonate: 30 mmol/L — ABNORMAL HIGH (ref 20.0–28.0)
O2 Saturation: 67 %
Patient temperature: 37
pCO2, Ven: 61 mmHg — ABNORMAL HIGH (ref 44–60)
pH, Ven: 7.3 (ref 7.25–7.43)
pO2, Ven: 43 mmHg (ref 32–45)

## 2022-06-04 LAB — BRAIN NATRIURETIC PEPTIDE: B Natriuretic Peptide: >4500 pg/mL — ABNORMAL HIGH (ref 0.0–100.0)

## 2022-06-04 LAB — TROPONIN I (HIGH SENSITIVITY): Troponin I (High Sensitivity): 20 ng/L — ABNORMAL HIGH (ref <18)

## 2022-06-04 LAB — LACTIC ACID, PLASMA: Lactic Acid, Venous: 1.7 mmol/L (ref 0.5–1.9)

## 2022-06-04 LAB — BASIC METABOLIC PANEL
Anion gap: 11 (ref 5–15)
BUN: 33 mg/dL — ABNORMAL HIGH (ref 8–23)
CO2: 26 mmol/L (ref 22–32)
Calcium: 9 mg/dL (ref 8.9–10.3)
Chloride: 103 mmol/L (ref 98–111)
Creatinine, Ser: 1.84 mg/dL — ABNORMAL HIGH (ref 0.61–1.24)
GFR, Estimated: 39 mL/min — ABNORMAL LOW (ref >60)
Glucose, Bld: 124 mg/dL — ABNORMAL HIGH (ref 70–99)
Potassium: 4.3 mmol/L (ref 3.5–5.1)
Sodium: 140 mmol/L (ref 135–145)

## 2022-06-04 LAB — SARS CORONAVIRUS 2 BY RT PCR: SARS Coronavirus 2 by RT PCR: NEGATIVE

## 2022-06-04 LAB — URINE DRUG SCREEN, QUALITATIVE (ARMC ONLY)
Amphetamines, Ur Screen: NOT DETECTED
Barbiturates, Ur Screen: NOT DETECTED
Benzodiazepine, Ur Scrn: NOT DETECTED
Cannabinoid 50 Ng, Ur ~~LOC~~: NOT DETECTED
Cocaine Metabolite,Ur ~~LOC~~: NOT DETECTED
MDMA (Ecstasy)Ur Screen: NOT DETECTED
Methadone Scn, Ur: NOT DETECTED
Opiate, Ur Screen: NOT DETECTED
Phencyclidine (PCP) Ur S: NOT DETECTED
Tricyclic, Ur Screen: NOT DETECTED

## 2022-06-04 LAB — GLUCOSE, CAPILLARY: Glucose-Capillary: 120 mg/dL — ABNORMAL HIGH (ref 70–99)

## 2022-06-04 LAB — MRSA NEXT GEN BY PCR, NASAL: MRSA by PCR Next Gen: NOT DETECTED

## 2022-06-04 LAB — D-DIMER, QUANTITATIVE: D-Dimer, Quant: 1.17 ug/mL-FEU — ABNORMAL HIGH (ref 0.00–0.50)

## 2022-06-04 MED ORDER — DOCUSATE SODIUM 100 MG PO CAPS: 100.0000 mg | ORAL_CAPSULE | Freq: Two times a day (BID) | ORAL | Status: DC | PRN

## 2022-06-04 MED ORDER — FUROSEMIDE 10 MG/ML IJ SOLN: 40.0000 mg | Freq: Every day | INTRAMUSCULAR | Status: DC

## 2022-06-04 MED ORDER — POLYETHYLENE GLYCOL 3350 17 G PO PACK: 17.0000 g | PACK | Freq: Every day | ORAL | Status: DC | PRN

## 2022-06-04 MED ORDER — SODIUM CHLORIDE 0.9 % IV SOLN
500.0000 mg | Freq: Once | INTRAVENOUS | Status: AC
  Administered 2022-06-04: 500 mg via INTRAVENOUS
  Filled 2022-06-04: qty 5

## 2022-06-04 MED ORDER — DEXMEDETOMIDINE HCL IN NACL 400 MCG/100ML IV SOLN: 0.0000 ug/kg/h | INTRAVENOUS | Status: DC

## 2022-06-04 MED ORDER — ORAL CARE MOUTH RINSE
15.0000 mL | OROMUCOSAL | Status: DC
  Administered 2022-06-05 – 2022-06-07 (×7): 15 mL via OROMUCOSAL

## 2022-06-04 MED ORDER — MAGNESIUM SULFATE 2 GM/50ML IV SOLN
2.0000 g | Freq: Once | INTRAVENOUS | Status: AC
  Administered 2022-06-04: 2 g via INTRAVENOUS
  Filled 2022-06-04: qty 50

## 2022-06-04 MED ORDER — DIAZEPAM 5 MG/ML IJ SOLN
5.0000 mg | Freq: Once | INTRAMUSCULAR | Status: AC
  Administered 2022-06-04: 5 mg via INTRAVENOUS
  Filled 2022-06-04: qty 2

## 2022-06-04 MED ORDER — IPRATROPIUM-ALBUTEROL 0.5-2.5 (3) MG/3ML IN SOLN
9.0000 mL | Freq: Once | RESPIRATORY_TRACT | Status: AC
  Administered 2022-06-04: 9 mL via RESPIRATORY_TRACT
  Filled 2022-06-04: qty 3

## 2022-06-04 MED ORDER — ORAL CARE MOUTH RINSE: 15.0000 mL | OROMUCOSAL | Status: DC | PRN

## 2022-06-04 MED ORDER — IPRATROPIUM-ALBUTEROL 0.5-2.5 (3) MG/3ML IN SOLN: 3.0000 mL | Freq: Four times a day (QID) | RESPIRATORY_TRACT | Status: DC | PRN

## 2022-06-04 MED ORDER — SODIUM CHLORIDE 0.9 % IV SOLN
500.0000 mg | INTRAVENOUS | Status: AC
  Administered 2022-06-05 – 2022-06-06 (×2): 500 mg via INTRAVENOUS
  Filled 2022-06-04: qty 5
  Filled 2022-06-04: qty 500

## 2022-06-04 MED ORDER — ACETAMINOPHEN 500 MG PO TABS: 1000.0000 mg | ORAL_TABLET | Freq: Once | ORAL | Status: DC

## 2022-06-04 MED ORDER — CHLORHEXIDINE GLUCONATE CLOTH 2 % EX PADS: 6.0000 | MEDICATED_PAD | Freq: Every day | CUTANEOUS | Status: DC

## 2022-06-04 MED ORDER — FUROSEMIDE 10 MG/ML IJ SOLN
20.0000 mg | Freq: Once | INTRAMUSCULAR | Status: AC
  Administered 2022-06-04: 20 mg via INTRAVENOUS
  Filled 2022-06-04: qty 2

## 2022-06-04 MED ORDER — NICOTINE 21 MG/24HR TD PT24
21.0000 mg | MEDICATED_PATCH | Freq: Every day | TRANSDERMAL | Status: DC
  Administered 2022-06-04 – 2022-06-07 (×4): 21 mg via TRANSDERMAL
  Filled 2022-06-04 (×4): qty 1

## 2022-06-04 MED ORDER — NITROGLYCERIN 0.4 MG SL SUBL: 0.4000 mg | SUBLINGUAL_TABLET | Freq: Once | SUBLINGUAL | Status: DC

## 2022-06-04 MED ORDER — BUDESONIDE 0.25 MG/2ML IN SUSP
0.2500 mg | Freq: Two times a day (BID) | RESPIRATORY_TRACT | Status: DC
  Administered 2022-06-05: 0.25 mg via RESPIRATORY_TRACT
  Filled 2022-06-04: qty 2

## 2022-06-04 MED ORDER — SODIUM CHLORIDE 0.9 % IV SOLN
1.0000 g | Freq: Once | INTRAVENOUS | Status: AC
  Administered 2022-06-04: 1 g via INTRAVENOUS
  Filled 2022-06-04: qty 10

## 2022-06-04 MED ORDER — METHYLPREDNISOLONE SODIUM SUCC 40 MG IJ SOLR
40.0000 mg | Freq: Every day | INTRAMUSCULAR | Status: DC
  Administered 2022-06-05 – 2022-06-06 (×2): 40 mg via INTRAVENOUS
  Filled 2022-06-04 (×2): qty 1

## 2022-06-04 MED ORDER — ENOXAPARIN SODIUM 30 MG/0.3ML IJ SOSY
30.0000 mg | PREFILLED_SYRINGE | INTRAMUSCULAR | Status: DC
  Administered 2022-06-04: 30 mg via SUBCUTANEOUS
  Filled 2022-06-04: qty 0.3

## 2022-06-04 MED ORDER — SODIUM CHLORIDE 0.9 % IV SOLN
1.0000 g | INTRAVENOUS | Status: DC
  Administered 2022-06-05 – 2022-06-06 (×2): 1 g via INTRAVENOUS
  Filled 2022-06-04 (×2): qty 10

## 2022-06-04 MED ORDER — IPRATROPIUM-ALBUTEROL 0.5-2.5 (3) MG/3ML IN SOLN
3.0000 mL | Freq: Four times a day (QID) | RESPIRATORY_TRACT | Status: DC
  Administered 2022-06-05 – 2022-06-07 (×9): 3 mL via RESPIRATORY_TRACT
  Filled 2022-06-04 (×12): qty 3

## 2022-06-04 MED ORDER — NITROGLYCERIN 2 % TD OINT
1.0000 [in_us] | TOPICAL_OINTMENT | Freq: Once | TRANSDERMAL | Status: AC
  Administered 2022-06-04: 1 [in_us] via TOPICAL
  Filled 2022-06-04: qty 1

## 2022-06-04 MED ORDER — FUROSEMIDE 10 MG/ML IJ SOLN
40.0000 mg | Freq: Once | INTRAMUSCULAR | Status: AC
  Administered 2022-06-04: 40 mg via INTRAVENOUS
  Filled 2022-06-04: qty 4

## 2022-06-04 MED ORDER — MORPHINE SULFATE (PF) 2 MG/ML IV SOLN
2.0000 mg | INTRAVENOUS | Status: DC | PRN
  Administered 2022-06-04 – 2022-06-05 (×3): 2 mg via INTRAVENOUS
  Filled 2022-06-04 (×3): qty 1

## 2022-06-04 NOTE — ED Triage Notes (Addendum)
PER EMS: pt is from home with c/o shortness of breath, onset three days ago but worsened today. He received 2 duo nebs, 1 albuterol, 125mg  of solumedrol. Denies chest pain. 90% RA. Hx of COPD. RR 40s, increased work of breathing.   180/120, HR-110, O2-97%.

## 2022-06-04 NOTE — ED Notes (Signed)
Dr. Anna Genre informed that the patient has not been covid swabbed yet nor has he received the PO Tylenol due to the importance of keeping the patient's BIPAP mask on.

## 2022-06-04 NOTE — ED Provider Notes (Addendum)
[facility] Provider [facility]    Event Date/Time   First MD Initiated Contact with Patient 06/04/22 1841     (approximate)   History   Respiratory Distress   HPI  Jonathon OmsDwight A Schirmer is a 72 y.o. male   Past medical history of COPD not on home O2, current smoker, CAD, CKD, CHF, who presents to the emergency department with shortness of breath and productive cough wheezing over the last several days.  Progressively worsening.  No chest pain.  Has had subjective fever and chills.  No peripheral edema no orthopnea.  He presents with EMS in respiratory distress, increased work of breathing and tachypnea with diffuse wheezing throughout all lung fields currently getting a nebulizer treatment.  EMS states that he got 2 DuoNeb's and 1 albuterol nebulizer in addition to 125 mg of Solu-Medrol while en route.  He is feeling a little bit better after this treatment.  His vital signs were no hypoxemia and normotension per EMS report.  Independent Historian contributed to assessment above: EMS  External Medical Documents Reviewed: Discharge summary from 03/19/2022 with COPD exacerbation      Physical Exam   Triage Vital Signs: ED Triage Vitals  Enc Vitals Group     BP      Pulse      Resp      Temp      Temp src      SpO2      Weight      Height      Head Circumference      Peak Flow      Pain Score      Pain Loc      Pain Edu?      Excl. in GC?     Most recent vital signs: Vitals:   06/04/22 1900 06/04/22 1929  BP:    Pulse:    Resp:  (!) 54  Temp:    SpO2: 100%     General: Awake, in respiratory distress speaking in one-word sentences. CV:  Good peripheral perfusion.  Some very minimal pitting edema to bilateral ankles. Resp:  Respiratory distress tachypnea diffuse wheezing speaking in one-word sentences Abd:  No distention.     ED Results / Procedures / Treatments   Labs (all labs ordered are listed, but only abnormal results are  displayed) Labs Reviewed  CBC WITH DIFFERENTIAL/PLATELET - Abnormal; Notable for the following components:      Result Value   Neutro Abs 8.2 (*)    All other components within normal limits  BASIC METABOLIC PANEL - Abnormal; Notable for the following components:   Glucose, Bld 124 (*)    BUN 33 (*)    Creatinine, Ser 1.84 (*)    GFR, Estimated 39 (*)    All other components within normal limits  BLOOD GAS, VENOUS - Abnormal; Notable for the following components:   pCO2, Ven 61 (*)    Bicarbonate 30.0 (*)    All other components within normal limits  SARS CORONAVIRUS 2 BY RT PCR  CULTURE, BLOOD (ROUTINE X 2)  CULTURE, BLOOD (ROUTINE X 2)  LACTIC ACID, PLASMA  BRAIN NATRIURETIC PEPTIDE     I ordered and reviewed the above labs they are notable for his VBG shows a pH of 7.30 and hypercarbia at 61  EKG  ED ECG REPORT I, Pilar JarvisSilas Ola Raap, the attending physician, personally viewed and interpreted this ECG.   Date: 06/04/2022  EKG Time: 1900  Rate: 100  Rhythm: sinus tachycardia  Axis: nl  Intervals:none  ST&T Change: no stemi    RADIOLOGY I independently reviewed and interpreted chest x-ray and see no obvious focality or pneumothorax   PROCEDURES:  Critical Care performed: Yes, see critical care procedure note(s)  .Critical Care  Performed by: Pilar Jarvis, MD Authorized by: Pilar Jarvis, MD   Critical care provider statement:    Critical care time (minutes):  30   Critical care was necessary to treat or prevent imminent or life-threatening deterioration of the following conditions:  Respiratory failure   Critical care was time spent personally by me on the following activities:  Development of treatment plan with patient or surrogate, discussions with consultants, evaluation of patient's response to treatment, examination of patient, ordering and review of laboratory studies, ordering and review of radiographic studies, ordering and performing treatments and  interventions, pulse oximetry, re-evaluation of patient's condition and review of old charts    MEDICATIONS ORDERED IN ED: Medications  magnesium sulfate IVPB 2 g 50 mL (2 g Intravenous New Bag/Given 06/04/22 1904)  azithromycin (ZITHROMAX) 500 mg in sodium chloride 0.9 % 250 mL IVPB (500 mg Intravenous New Bag/Given 06/04/22 1915)  acetaminophen (TYLENOL) tablet 1,000 mg (has no administration in time range)  furosemide (LASIX) injection 40 mg (has no administration in time range)  nitroGLYCERIN (NITROGLYN) 2 % ointment 1 inch (has no administration in time range)  ipratropium-albuterol (DUONEB) 0.5-2.5 (3) MG/3ML nebulizer solution 9 mL (9 mLs Nebulization Given 06/04/22 1903)  cefTRIAXone (ROCEPHIN) 1 g in sodium chloride 0.9 % 100 mL IVPB (0 g Intravenous Stopped 06/04/22 1946)  diazepam (VALIUM) injection 5 mg (5 mg Intravenous Given 06/04/22 1924)    External physician / consultants:  I spoke with hospitalist for admission and regarding care plan for this patient.   IMPRESSION / MDM / ASSESSMENT AND PLAN / ED COURSE  I reviewed the triage vital signs and the nursing notes.                                Patient's presentation is most consistent with acute presentation with potential threat to life or bodily function.  Differential diagnosis includes, but is not limited to, acute respiratory failure, COPD exacerbation, respiratory infection, bacterial pneumonia, sepsis, ACS, PE, CHF exacerbation   The patient is on the cardiac monitor to evaluate for evidence of arrhythmia and/or significant heart rate changes.  MDM: Most consistent with COPD exacerbation given productive cough, history of COPD, current smoker and diffuse wheezing throughout all lung fields and respiratory distress with some mild response to DuoNebs given by EMS.  Will give DuoNebs, put on BiPAP for respiratory distress, obtain VBG, chest x-ray, basic labs and will require admission.  Will give community-acquired  pneumonia coverage given severe COPD exacerbation with subjective fever/chills and productive cough.  He also has a fever of 100.8 and tachycardia.  He has a history of CHF with reduced ejection fraction and evidence of mild fluid overload with pitting edema to bilateral ankles, so I will refrain from giving him IV fluids at this time until I obtain more information including BNP, chest x-ray in case there is an element of mixed CHF along with his COPD that would worsen from additional fluids.    He does have pulmonary edema on his chest x-ray.  He is hypertensive.  I ordered for some Nitropaste as well as Lasix.  Although he states that he feels like his  breathing is better on the BiPAP, he continues to have increased respiratory rate in the 40s and I worry that he might tire out.  He is adamantly refusing to be intubated at this time.  We will continue to monitor his response to nitro/Lasix and BiPAP since these have just been started.  I consulted with ICU for admission.        FINAL CLINICAL IMPRESSION(S) / ED DIAGNOSES   Final diagnoses:  COPD with acute exacerbation  Fever, unspecified fever cause  Productive cough  Acute respiratory failure with hypercapnia  Acute pulmonary edema     Rx / DC Orders   ED Discharge Orders     None        Note:  This document was prepared using Dragon voice recognition software and may include unintentional dictation errors.    Pilar Jarvis, MD 06/04/22 [facility]    Pilar Jarvis, MD 06/04/22 [phone]

## 2022-06-04 NOTE — Progress Notes (Signed)
RT called to run ABG for NP

## 2022-06-04 NOTE — Progress Notes (Signed)
Pt placed on BiPAP per MD order due to Resp Distress

## 2022-06-04 NOTE — Plan of Care (Signed)
Discussed with patient plan of care for the evening, pain management and admission questions with some teach back displayed.  Patient encouraged to use male external catheter and prn Morphine instead of foley or Precedex at this time.  Problem: Education: Goal: Knowledge of General Education information will improve Description: Including pain rating scale, medication(s)/side effects and non-pharmacologic comfort measures Outcome: Progressing   Problem: Health Behavior/Discharge Planning: Goal: Ability to manage health-related needs will improve Outcome: Progressing

## 2022-06-04 NOTE — H&P (Signed)
NAME:  Jonathon Rios, MRN:  [id number], DOB:  08-08-1950, LOS: 0 ADMISSION DATE:  06/04/2022, CONSULTATION DATE: 06/04/2022 REFERRING MD: Pilar JarvisWong, Silas, CHIEF COMPLAINT: Progressive shortness of breath   HPI  72 y.o male with significant PMH of  HFrEF last LVEF of 25-30%, NICM, Vfib cardiac arrest s/p AICD, COPD, HTN, HLD, polysubstance abuse, current everyday smoker seizure disorder and CKD who presented to the ED with chief complaints of shortness of breath associated with productive cough, wheezing, fevers and chills x 3 days.   ED Course: Initial vital signs showed HR of 110 beats/minute, BP 170/122 mm Hg, the RR 40 breaths/minute, and the oxygen saturation 100 % on NRB  and a temperature of 100.8 (38.2 C). Patient was with increased work of breathing and was immediately placed on BiPAP.  Patient received 2 DuoNeb's treatment, 1 albuterol and 125 mg of Solu-Medrol by EMS prior to arrival.  Pertinent Labs/Diagnostics Findings:  Na+/ K+:140/4.3, Glucose: 124 BUN/Cr.:33/1.84,  WBC:9.9, PCT,  Lactic acid:1.7, COVID PCR: Pending, Troponin:20, BNP:>4500   VBG: pO2 43; pCO2 61; pH 7.3; HCO3 30, %O2 Sat 67.  CXR> Changes of CHF with mild edema.  Patient recevied broad-spectrum IV antibiotics due to suspected community-acquired pneumonia given history of severe COPD now with exacerbation (wheezing, productive cough and fevers). He also received IV Lasix and Nitropaste for pulmonary edema seen on chest x-ray and elevated BNP>45000 consistent with CHF exacerbation.  Patient remained on BiPAP still with increased work of breathing.  Due to high risk for intubation, PCCM consulted for admission.  Past Medical History  HFrEF last LVEF of 25-30%, NICM, Vfib cardiac arrest s/p AICD, COPD, HTN, HLD, polysubstance abuse, current everyday smoker seizure disorder and CKD   Significant Hospital Events   4/6: Admitted ICU with acute on chronic hypoxic hypercapnic respiratory failure secondary to AECOPD, CAP &  CHF exacerbation.  Consults:  Cardiology  Procedures:  None  Significant Diagnostic Tests:  4/6: Chest Xray> IMPRESSION: Changes of CHF with mild edema.  4/6: CTA Chest> IMPRESSION: 1. New ground-glass disease in the anterior basal segment of the right lower lobe most likely due to pneumonitis, alternatively could be interval new chronic ground-glass interstitial disease. 2. Cardiomegaly without evidence of CHF. Trace calcification LAD coronary artery. 3. 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 Recommend annual imaging followup by CTA or MRA. This recommendation follows 2010 ACCF/AHA/AATS/ACR/ASA/SCA/SCAI/SIR/STS/SVM Guidelines for the Diagnosis and Management of Patients with Thoracic Aortic Disease. Circulation. 2010; 121: W098-J191: E266-e369. Aortic aneurysm NOS (ICD10-I71.9) 4. Prominent pulmonary trunk again noted. 5. Emphysema. For lung cancer screening, adhere to Lung-RADS guidelines. 6. Chronic findings described above.  Interim History / Subjective:  -Remains on BiPAP  Micro Data:  4/6: SARS-CoV-2 PCR> negative 4/6: Influenza PCR> negative 4/6: Respiratory panel 4/6: Blood culture x2> 4/6: MRSA PCR>>  4/6: Strep pneumo urinary antigen> 4/6: Legionella urinary antigen>  Antimicrobials:  Azithromycin 4/6>> Ceftriaxone 4/6>>  OBJECTIVE  Blood pressure (!) 124/91, pulse 79, temperature (!) 100.8 F (38.2 C), temperature source Axillary, resp. rate (!) 25, height 6\' 3"  (1.905 m), weight 81.2 kg, SpO2 97 %.    FiO2 (%):  [45 %] 45 %   Intake/Output Summary (Last 24 hours) at 06/04/2022 2145 Last data filed at 06/04/2022 2110 Gross per 24 hour  Intake --  Output 525 ml  Net -525 ml   Filed Weights   06/04/22 1918  Weight: 81.2 kg   Physical Examination  GENERAL: 72 year-old critically ill patient lying  in the bed on BiPAP EYES: PEERLA. No scleral icterus. Extraocular muscles intact.  HEENT: Head atraumatic, normocephalic.  Oropharynx and nasopharynx clear.  NECK:  No JVD, supple  LUNGS: Decreased breath sounds bilaterally. Diffuse wheezing throughout all lung fields with mild use of accessory muscles of respiration.  CARDIOVASCULAR: S1, S2 normal. No murmurs, rubs, or gallops.  ABDOMEN: Soft, NTND EXTREMITIES: Bilateral lower extremity +1 pitting edema capillary refill is less than 3 seconds in all extremities. Pulses palpable distally. NEUROLOGIC: The patient is on BiPAP.  No focal neurological deficits moving all extremities spontaneously.  Cranial nerves are intact.  SKIN: No obvious rash, lesion, or ulcer. Warm to touch Labs/imaging that I havepersonally reviewed  (right click and "Reselect all SmartList Selections" daily)     Labs   CBC: Recent Labs  Lab 06/04/22 1856  WBC 9.9  NEUTROABS 8.2*  HGB 16.3  HCT 51.0  MCV 92.4  PLT 153    Basic Metabolic Panel: Recent Labs  Lab 06/04/22 1856  NA 140  K 4.3  CL 103  CO2 26  GLUCOSE 124*  BUN 33*  CREATININE 1.84*  CALCIUM 9.0   GFR: Estimated Creatinine Clearance: 43.5 mL/min (A) (by C-G formula based on SCr of 1.84 mg/dL (H)). Recent Labs  Lab 06/04/22 1856  WBC 9.9  LATICACIDVEN 1.7    Liver Function Tests: No results for input(s): "AST", "ALT", "ALKPHOS", "BILITOT", "PROT", "ALBUMIN" in the last 168 hours. No results for input(s): "LIPASE", "AMYLASE" in the last 168 hours. No results for input(s): "AMMONIA" in the last 168 hours.  ABG    Component Value Date/Time   PHART 7.32 (L) 06/04/2022 2004   PCO2ART 52 (H) 06/04/2022 2004   PO2ART 162 (H) 06/04/2022 2004   HCO3 26.4 06/04/2022 2004   ACIDBASEDEF 0.1 06/04/2022 2004   O2SAT 100 06/04/2022 2004     Coagulation Profile: No results for input(s): "INR", "PROTIME" in the last 168 hours.  Cardiac Enzymes: No results for input(s): "CKTOTAL", "CKMB", "CKMBINDEX", "TROPONINI" in the last 168 hours.  HbA1C: Hgb A1c MFr Bld  Date/Time Value Ref Range Status   10/08/2020 06:46 PM 5.5 4.8 - 5.6 % Final    Comment:    (NOTE) Pre diabetes:          5.7%-6.4%  Diabetes:              >6.4%  Glycemic control for   <7.0% adults with diabetes   08/13/2020 12:38 PM 5.9 4.6 - 6.5 % Final    Comment:    Glycemic Control Guidelines for People with Diabetes:Non Diabetic:  <6%Goal of Therapy: <7%Additional Action Suggested:  >8%     CBG: Recent Labs  Lab 06/04/22 2133  GLUCAP 120*    Review of Systems:   Unable to be obtained secondary to the patient is on BiPAP  Past Medical History  He,  has a past medical history of Alcoholism and drug addiction in family, Arthritis, CHF (congestive heart failure), Chronic HFrEF (heart failure with reduced ejection fraction), Chronic kidney disease, COPD (chronic obstructive pulmonary disease), Hypertension, Irregular heart beat, NICM (nonischemic cardiomyopathy), Nonobstructive Coronary artery disease, Positive TB test, and Seizure.   Surgical History    Past Surgical History:  Procedure Laterality Date   CARDIAC CATHETERIZATION     COLONOSCOPY WITH PROPOFOL N/A 08/23/2021   Procedure: COLONOSCOPY WITH PROPOFOL;  Surgeon: Toney Reil, MD;  Location: [facility] ENDOSCOPY;  Service: Gastroenterology;  Laterality: N/A;   RIGHT/LEFT HEART CATH AND CORONARY ANGIOGRAPHY N/A  07/30/2020   Procedure: RIGHT/LEFT HEART CATH AND CORONARY ANGIOGRAPHY poss PCI;  Surgeon: Iran Ouch, MD;  Location: [facility] INVASIVE CV LAB;  Service: Cardiovascular;  Laterality: N/A;   SUBQ ICD IMPLANT N/A 10/15/2020   Procedure: SUBQ ICD IMPLANT;  Surgeon: Lanier Prude, MD;  Location: [facility] INVASIVE CV LAB;  Service: Cardiovascular;  Laterality: N/A;   TONSILLECTOMY       Social History   reports that he quit smoking about 5 years ago. His smoking use included cigarettes. He has a 12.50 pack-year smoking history. He has never used smokeless tobacco. He reports that he does not currently use drugs after having used the following  drugs: "Crack" cocaine, Heroin, Marijuana, and LSD. He reports that he does not drink alcohol.   Family History   His family history includes Alcohol abuse in his daughter, father, mother, and sister; Asthma in his sister; Depression in his daughter and sister; Diabetes in his mother; Drug abuse in his daughter and sister; Early death in his daughter; Hyperlipidemia in his mother; Hypertension in his daughter, father, mother, and sister.   Allergies Allergies  Allergen Reactions   Penicillins Hives    Has patient had a PCN reaction causing immediate rash, facial/tongue/throat swelling, SOB or lightheadedness with hypotension: Yes Has patient had a PCN reaction causing severe rash involving mucus membranes or skin necrosis: No Has patient had a PCN reaction that required hospitalization: No Has patient had a PCN reaction occurring within the last 10 years: No If all of the above answers are "NO", then may proceed with Cephalosporin use. Has patient had a PCN reaction causing immediate rash, facial/tongue/throat swelling, SOB or lightheadedness with hypotension: Yes Has patient had a PCN reaction causing severe rash involving mucus membranes or skin necrosis: No Has patient had a PCN reaction that required hospitalization: No Has patient had a PCN reaction occurring within the last 10 years: No If all of the above answers are "NO", then may proceed with Cephalosporin use. Has patient had a PCN reaction causing immediate rash, facial/tongue/throat swelling, SOB or lightheadedness with hypotension: Yes Has patient had a PCN reaction causing severe rash involving mucus membranes or skin necrosis: No Has patient had a PCN reaction that required hospitalization: No Has patient had a PCN reaction occurring within the last 10 years: No If all of the above answers are "NO", then may proceed with Cephalosporin use.     Home Medications  Prior to Admission medications   Medication Sig Start Date End  Date Taking? Authorizing Provider  acetaminophen (TYLENOL) 325 MG tablet Take 2 tablets (650 mg total) by mouth every 6 (six) hours as needed for fever. 10/16/20   Elgergawy, Leana Roe, MD  albuterol (VENTOLIN HFA) 108 (90 Base) MCG/ACT inhaler Inhale 2 puffs into the lungs every 6 (six) hours as needed for wheezing or shortness of breath. 05/17/22   Allegra Grana, FNP  aspirin EC 81 MG tablet Take 1 tablet (81 mg total) by mouth daily. 07/26/18   Iran Ouch, MD  benzonatate (TESSALON) 100 MG capsule Take 1 capsule (100 mg total) by mouth 3 (three) times daily as needed for cough. 06/15/21   Allegra Grana, FNP  budesonide-formoterol (SYMBICORT) 160-4.5 MCG/ACT inhaler TAKE 2 PUFFS BY MOUTH TWICE A DAY 09/08/21   Allegra Grana, FNP  carvedilol (COREG) 25 MG tablet Take 1 tablet (25 mg total) by mouth 2 (two) times daily. 11/02/21   Iran Ouch, MD  clotrimazole (LOTRIMIN) 1 %  cream APPLY TO AFFECTED AREA TWICE A DAY 10/13/21   Allegra Grana, FNP  ENTRESTO 97-103 MG TAKE 1 TABLET BY MOUTH TWICE A DAY 03/09/22   Allegra Grana, FNP  FARXIGA 10 MG TABS tablet TAKE 1 TABLET BY MOUTH EVERY DAY BEFORE BREAKFAST 03/09/22   Allegra Grana, FNP  feeding supplement (ENSURE ENLIVE / ENSURE PLUS) LIQD Take 237 mLs by mouth 3 (three) times daily between meals. 10/14/20   Darlin Priestly, MD  furosemide (LASIX) 40 MG tablet Take 2 tablets (80 mg total) by mouth daily. 01/07/22   Delma Freeze, FNP  hydrocortisone 2.5 % cream Apply topically to face qhs t thur and Saturday for rash 01/13/22   Deirdre Evener, MD  ipratropium-albuterol (DUONEB) 0.5-2.5 (3) MG/3ML SOLN Inhale 3 mLs into the lungs every 4 (four) hours as needed. 12/20/21 12/15/22  [provider]  ketoconazole (NIZORAL) 2 % cream Apply topically to face qhs on m w Friday for rash 01/13/22   Deirdre Evener, MD  predniSONE (STERAPRED UNI-PAK 21 TAB) 10 MG (21) TBPK tablet Start 60 mg po daily, taper 10 mg daily until  finish 03/19/22   Delfino Lovett, MD  tamsulosin (FLOMAX) 0.4 MG CAPS capsule TAKE 1 CAPSULE BY MOUTH EVERY DAY 03/09/22   Stoioff, Verna Czech, MD  triamcinolone (KENALOG) 0.025 % cream Apply 1 application. topically 2 (two) times daily. Do not use longer than 7 days due to skin discoloration 07/12/21   Allegra Grana, FNP  zolpidem (AMBIEN) 5 MG tablet Take 1 tablet (5 mg total) by mouth at bedtime as needed for sleep. 03/19/22 04/18/22  Delfino Lovett, MD  Scheduled Meds:  acetaminophen  1,000 mg Oral Once   [START ON 06/05/2022] Chlorhexidine Gluconate Cloth  6 each Topical Daily   enoxaparin (LOVENOX) injection  30 mg Subcutaneous Q24H   [START ON 06/05/2022] mouth rinse  15 mL Mouth Rinse 4 times per day   Continuous Infusions:  dexmedetomidine (PRECEDEX) IV infusion Stopped (06/04/22 2029)   PRN Meds:.docusate sodium, morphine injection, mouth rinse, polyethylene glycol   Active Hospital Problem list     Assessment & Plan:  #Acute on Chronic Hypoxic and Hypercapnic Respiratory Failure #Acute Exacerbation of COPD #CAP #Flash Pulmonary Edema  Background COPD, current everyday smoker, and HFrEF. Now with hypoxic respiratory failure requiring BiPAP therapy.  CT chest ? pneumonitis   -Supplemental O2 as needed to maintain O2 saturations 88 to 92% -BiPAP, wean as tolerated -High risk for intubation -Follow intermittent Chest X-ray & ABG as needed -Bronchodilators and Pulmicort nebs -IV Solu-Medrol 40 mg daily -Antibiotics as below -Smoking Cessation counseling    #Sepsis due to Suspected Pneumonia -F/u cultures, trend lactic/ PCT -Obtain respiratory panel, Strep Pneumo and Legionella -Monitor WBC/ fever curve -IV antibiotics Ceftriaxone AND Azithromycin -Gentle IVF hydration as needed -Pressors PRN for MAP goal >65 -Strict I/O's   #Acute on chronic HFrEF (last LVEF of 25-30% ) #Hypertension Hx:CAD, NICM, Vfib arrest, s/p AICD, HLD -Continuous cardiac monitoring -Maintain MAP  greater than 65 -IV Lasix as blood pressure and renal function permits; currently on Lasix 40 mg IV BID -Continue aspirin 81 mg -Continue Entresto, Farxiga and Coreg as BP permits -Cardiology consult -Repeat 2D Echocardiogram   #AKI on CKD Stage III -Monitor I&O's / urinary output -Follow BMP -Ensure adequate renal perfusion -Avoid nephrotoxic agents as able -Replace electrolytes as indicated    Best practice:  Diet:  NPO Pain/Anxiety/Delirium protocol (if indicated): No VAP protocol (if indicated): Not  indicated DVT prophylaxis: LMWH GI prophylaxis: N/A Glucose control:  SSI No Central venous access:  N/A Arterial line:  N/A Foley:  N/A Mobility:  bed rest  PT consulted: N/A Last date of multidisciplinary goals of care discussion [4/6] Code Status:  full code Disposition: ICU   = Goals of Care = Code Status Order:FULL  Primary Emergency Contact: Dickerson,Delanor Wishes to pursue full aggressive treatment and intervention options, including CPR and intubation, but goals of care will be addressed on going with family if that should become necessary.   Critical care time: 45 minutes       Webb Silversmith DNP, CCRN, FNP-C, AGACNP-BC Acute Care & Family Nurse Practitioner [facility] PCCM on call pager [phone]

## 2022-06-04 NOTE — Progress Notes (Signed)
PHARMACY CONSULT NOTE - FOLLOW UP  Pharmacy Consult for Electrolyte Monitoring and Replacement   Recent Labs: Potassium (mmol/L)  Date Value  06/04/2022 4.3   Magnesium (mg/dL)  Date Value  14/78/2956 1.7   Calcium (mg/dL)  Date Value  21/30/8657 9.0   Albumin (g/dL)  Date Value  84/69/6295 2.7 (L)  08/18/2017 4.0   Phosphorus (mg/dL)  Date Value  28/41/3244 3.4   Sodium (mmol/L)  Date Value  06/04/2022 140  01/11/2021 144     Assessment: Electrolytes WNL  Goal of Therapy:  Electrolytes WNL  Plan:  F/U BMP, Mag and Phos with 4/7 AM Labs  Manfred Shirts ,PharmD Clinical Pharmacist 06/04/2022 8:21 PM

## 2022-06-04 NOTE — ED Notes (Signed)
Pt appears to be more comfortable. Critical Care MD states to hold off on Precedex at this time.

## 2022-06-04 NOTE — ED Notes (Signed)
Pt currently on BIPAP due to severe respiratory distress. Will defer nasal covid swab at this time to keep BIPAP mask on.

## 2022-06-04 NOTE — ED Notes (Signed)
Pt is to go to CT and then will be transported to ICU. Pt taken to CT accompanied by Leanord Hawking, RN, Bambi, RT and a nurse tech.

## 2022-06-04 NOTE — ED Notes (Signed)
Report attempted 

## 2022-06-04 NOTE — Progress Notes (Signed)
Pt transported to CT then to ICU7 with RN without incident

## 2022-06-04 NOTE — Progress Notes (Signed)
FiO2 decreased to 30% based on PAO2 result of 162

## 2022-06-04 NOTE — Progress Notes (Signed)
eLink Physician-Brief Progress Note Patient Name: Jonathon Rios DOB: July 29, 1950 MRN: [id number]   Date of Service  06/04/2022  HPI/Events of Note  72 year old male, ongoing smoker with a history of coronary artery disease, CKD and heart failure that presented to the emergency department with progressive shortness of breath, cough, wheezing over the last several days.  He was noted to be in a COPD exacerbation.  He was febrile, moderately tachypneic, tachycardic and and evidence of mild hypercapnia on his gas.Has evidence of an acute kidney injury and BNP elevation consistent with fluid overload.  CT chest reviewed.  Motion degraded exam.  Likely pulmonary hypertension, has no focal airspace disease, diffuse emphysema  2022 echo consistent with reduced LV and RV function.  He was placed on BiPAP therapy and referred to critical care for further management.  He was treated with ceftriaxone, azithromycin, and Lasix.  Required Precedex for agitation.  eICU Interventions  Repeat echocardiogram is pending.  Would continue at least azithromycin, low threshold to discontinue ceftriaxone if procalcitonin is negative.   Trend procalcitonin.  Hold on further benzodiazepines, avoid further Valium.  Agree with diuresis, although he does not have copious pulmonary edema on his CT.  Overall, no acute intervention is indicated at this time.     Intervention Category Evaluation Type: New Patient Evaluation  Karla Pavone 06/04/2022, 9:50 PM

## 2022-06-05 ENCOUNTER — Inpatient Hospital Stay (HOSPITAL_COMMUNITY)
Admit: 2022-06-05 | Discharge: 2022-06-05 | Disposition: A | Discharge status: Home / Self Care | Payer: 59 | Attending: Nurse Practitioner | Admitting: Nurse Practitioner

## 2022-06-05 DIAGNOSIS — B348 Other viral infections of unspecified site: Secondary | ICD-10-CM | POA: Diagnosis not present

## 2022-06-05 DIAGNOSIS — I5031 Acute diastolic (congestive) heart failure: Secondary | ICD-10-CM | POA: Diagnosis not present

## 2022-06-05 DIAGNOSIS — J9622 Acute and chronic respiratory failure with hypercapnia: Secondary | ICD-10-CM

## 2022-06-05 DIAGNOSIS — J441 Chronic obstructive pulmonary disease with (acute) exacerbation: Secondary | ICD-10-CM | POA: Diagnosis not present

## 2022-06-05 DIAGNOSIS — I5020 Unspecified systolic (congestive) heart failure: Secondary | ICD-10-CM

## 2022-06-05 DIAGNOSIS — J9621 Acute and chronic respiratory failure with hypoxia: Secondary | ICD-10-CM | POA: Diagnosis not present

## 2022-06-05 DIAGNOSIS — N1831 Chronic kidney disease, stage 3a: Secondary | ICD-10-CM

## 2022-06-05 LAB — ECHOCARDIOGRAM COMPLETE
AR max vel: 3.58 cm2
AV Peak grad: 8.8 mmHg
Ao pk vel: 1.48 m/s
Area-P 1/2: 5.5 cm2
Calc EF: 31.2 %
Height: 75 in
S' Lateral: 5.1 cm
Single Plane A2C EF: 5.2 %
Single Plane A4C EF: 46.7 %
Weight: 2627.88 oz

## 2022-06-05 LAB — RESPIRATORY PANEL BY PCR

## 2022-06-05 LAB — MAGNESIUM: Magnesium: 2.2 mg/dL (ref 1.7–2.4)

## 2022-06-05 LAB — BASIC METABOLIC PANEL
Anion gap: 9 (ref 5–15)
BUN: 33 mg/dL — ABNORMAL HIGH (ref 8–23)
CO2: 28 mmol/L (ref 22–32)
Calcium: 8.6 mg/dL — ABNORMAL LOW (ref 8.9–10.3)
Chloride: 103 mmol/L (ref 98–111)
Creatinine, Ser: 1.77 mg/dL — ABNORMAL HIGH (ref 0.61–1.24)
GFR, Estimated: 41 mL/min — ABNORMAL LOW (ref >60)
Glucose, Bld: 123 mg/dL — ABNORMAL HIGH (ref 70–99)
Potassium: 4.1 mmol/L (ref 3.5–5.1)
Sodium: 140 mmol/L (ref 135–145)

## 2022-06-05 LAB — CBC
HCT: 45.5 % (ref 39.0–52.0)
Hemoglobin: 14.8 g/dL (ref 13.0–17.0)
MCH: 29.4 pg (ref 26.0–34.0)
MCHC: 32.5 g/dL (ref 30.0–36.0)
MCV: 90.5 fL (ref 80.0–100.0)
Platelets: 124 10*3/uL — ABNORMAL LOW (ref 150–400)
RBC: 5.03 MIL/uL (ref 4.22–5.81)
RDW: 14.3 % (ref 11.5–15.5)
WBC: 8.8 10*3/uL (ref 4.0–10.5)
nRBC: 0 % (ref 0.0–0.2)

## 2022-06-05 LAB — BLOOD GAS, VENOUS
Acid-Base Excess: 6.7 mmol/L — ABNORMAL HIGH (ref 0.0–2.0)
Bicarbonate: 33.3 mmol/L — ABNORMAL HIGH (ref 20.0–28.0)
O2 Saturation: 83.9 %
Patient temperature: 37
pCO2, Ven: 55 mmHg (ref 44–60)
pH, Ven: 7.39 (ref 7.25–7.43)
pO2, Ven: 53 mmHg — ABNORMAL HIGH (ref 32–45)

## 2022-06-05 LAB — GLUCOSE, CAPILLARY
Glucose-Capillary: 110 mg/dL — ABNORMAL HIGH (ref 70–99)
Glucose-Capillary: 130 mg/dL — ABNORMAL HIGH (ref 70–99)
Glucose-Capillary: 191 mg/dL — ABNORMAL HIGH (ref 70–99)

## 2022-06-05 LAB — TROPONIN I (HIGH SENSITIVITY): Troponin I (High Sensitivity): 18 ng/L — ABNORMAL HIGH (ref <18)

## 2022-06-05 LAB — PROCALCITONIN
Procalcitonin: <0.1 ng/mL
Procalcitonin: 0.12 ng/mL

## 2022-06-05 LAB — PHOSPHORUS: Phosphorus: 4.2 mg/dL (ref 2.5–4.6)

## 2022-06-05 LAB — STREP PNEUMONIAE URINARY ANTIGEN: Strep Pneumo Urinary Antigen: NEGATIVE

## 2022-06-05 MED ORDER — FUROSEMIDE 40 MG PO TABS
40.0000 mg | ORAL_TABLET | Freq: Two times a day (BID) | ORAL | Status: DC
  Administered 2022-06-05 – 2022-06-07 (×4): 40 mg via ORAL
  Filled 2022-06-05: qty 1
  Filled 2022-06-05: qty 2
  Filled 2022-06-05: qty 1
  Filled 2022-06-05: qty 2

## 2022-06-05 MED ORDER — ENOXAPARIN SODIUM 40 MG/0.4ML IJ SOSY
40.0000 mg | PREFILLED_SYRINGE | INTRAMUSCULAR | Status: DC
  Administered 2022-06-05 – 2022-06-06 (×2): 40 mg via SUBCUTANEOUS
  Filled 2022-06-05 (×2): qty 0.4

## 2022-06-05 MED ORDER — SACUBITRIL-VALSARTAN 97-103 MG PO TABS
1.0000 | ORAL_TABLET | Freq: Two times a day (BID) | ORAL | Status: DC
  Administered 2022-06-05 – 2022-06-07 (×4): 1 via ORAL
  Filled 2022-06-05 (×4): qty 1

## 2022-06-05 MED ORDER — FUROSEMIDE 10 MG/ML IJ SOLN
20.0000 mg | Freq: Every day | INTRAMUSCULAR | Status: DC
  Administered 2022-06-05: 20 mg via INTRAVENOUS
  Filled 2022-06-05: qty 2

## 2022-06-05 MED ORDER — CARVEDILOL 12.5 MG PO TABS
12.5000 mg | ORAL_TABLET | Freq: Two times a day (BID) | ORAL | Status: DC
  Administered 2022-06-05 – 2022-06-07 (×5): 12.5 mg via ORAL
  Filled 2022-06-05 (×5): qty 1

## 2022-06-05 MED ORDER — CHLORHEXIDINE GLUCONATE CLOTH 2 % EX PADS
6.0000 | MEDICATED_PAD | Freq: Every day | CUTANEOUS | Status: DC
  Administered 2022-06-06: 6 via TOPICAL

## 2022-06-05 NOTE — Progress Notes (Signed)
PHARMACY CONSULT NOTE - FOLLOW UP  Pharmacy Consult for Electrolyte Monitoring and Replacement   Recent Labs: Potassium (mmol/L)  Date Value  06/05/2022 4.1   Magnesium (mg/dL)  Date Value  32/20/2542 2.2   Calcium (mg/dL)  Date Value  70/62/3762 8.6 (L)   Albumin (g/dL)  Date Value  83/15/1761 2.7 (L)  08/18/2017 4.0   Phosphorus (mg/dL)  Date Value  60/73/7106 4.2   Sodium (mmol/L)  Date Value  06/05/2022 140  01/11/2021 144     Assessment: 72 y.o male with significant PMH of  HFrEF last LVEF of 25-30%, NICM, Vfib cardiac arrest s/p AICD, COPD, HTN, HLD, polysubstance abuse, current everyday smoker seizure disorder and CKD who presented to the ED with chief complaints of shortness of breath associated with productive cough, wheezing, fevers and chills x 3 days. Patient was transferred to ICU d/t high risk of intubation. Pharmacy has been consulted to monitor and replace electrolytes while under PCCM care.  Goal of Therapy:  Electrolytes WNL  Plan:  No replacement currently indicated Will recheck electrolytes with AM labs  Bettey Costa ,PharmD Clinical Pharmacist 06/05/2022 8:53 AM

## 2022-06-05 NOTE — Progress Notes (Signed)
NAME:  Jonathon Rios, MRN:  [id number], DOB:  May 31, 1950, LOS: 1 ADMISSION DATE:  06/04/2022, CHIEF COMPLAINT:  Respiratory Failure   History of Present Illness:   72 y.o male with significant PMH of  HFrEF last LVEF of 25-30%, NICM, Vfib cardiac arrest s/p AICD, COPD, HTN, HLD, polysubstance abuse, current everyday smoker seizure disorder and CKD who presented to the ED with chief complaints of shortness of breath associated with productive cough, wheezing, fevers and chills x 3 days.   ED Course: Initial vital signs showed HR of 110 beats/minute, BP 170/122 mm Hg, the RR 40 breaths/minute, and the oxygen saturation 100 % on NRB  and a temperature of 100.8 (38.2 C). Patient was with increased work of breathing and was immediately placed on BiPAP.  Patient received 2 DuoNeb's treatment, 1 albuterol and 125 mg of Solu-Medrol by EMS prior to arrival.  Patient recevied broad-spectrum IV antibiotics due to suspected community-acquired pneumonia given history of severe COPD now with exacerbation (wheezing, productive cough and fevers). He also received IV Lasix and Nitropaste for concern of pulmonary edema.  Patient remained on BiPAP still with increased work of breathing.  Due to high risk for intubation, PCCM consulted for admission.  Pertinent  Medical History  HFrEF last LVEF of 25-30%, NICM, Vfib cardiac arrest s/p AICD, COPD, HTN, HLD, polysubstance abuse, current everyday smoker seizure disorder and CKD   Significant Hospital Events: Including procedures, antibiotic start and stop dates in addition to other pertinent events   4/6: admitted on BiPAP 4/7: repeat gas improved, nasal cannula  Interim History / Subjective:  Breathing is improved, denies chest pain, no other abdominal pains  Objective   Blood pressure 131/83, pulse 71, temperature 98.3 F (36.8 C), temperature source Axillary, resp. rate (!) 33, height 6\' 3"  (1.905 m), weight 74.5 kg, SpO2 96 %.    FiO2 (%):  [30 %-45 %] 30  %   Intake/Output Summary (Last 24 hours) at 06/05/2022 0826 Last data filed at 06/05/2022 0300 Gross per 24 hour  Intake 379.13 ml  Output 2125 ml  Net -1745.87 ml   Filed Weights   06/04/22 1918 06/05/22 0400  Weight: 81.2 kg 74.5 kg    Examination: Physical Exam Constitutional:      General: He is not in acute distress.    Appearance: He is ill-appearing.  HENT:     Head: Normocephalic.     Mouth/Throat:     Mouth: Mucous membranes are moist.  Eyes:     Extraocular Movements: Extraocular movements intact.  Cardiovascular:     Rate and Rhythm: Normal rate and regular rhythm.     Pulses: Normal pulses.     Heart sounds: Normal heart sounds.  Pulmonary:     Effort: Pulmonary effort is normal.     Breath sounds: No wheezing, rhonchi or rales.  Abdominal:     Palpations: Abdomen is soft.  Musculoskeletal:     Right lower leg: No edema.     Left lower leg: No edema.  Neurological:     General: No focal deficit present.     Mental Status: He is alert and oriented to person, place, and time. Mental status is at baseline.     Assessment & Plan:   Neurology #History of Seizure  Not maintained on anti-epileptics. This is a historic diagnosis carried over from chart review. Patient has not had any seizure like activity.  -monitor  Cardiovascular #HFrEF #History of Vfib arrest (ICD 09/2020) #HTN  History  of ischemic cardiomyopathy, EF 30%, on GDMT (dapagliflozin, carvedilol, sacubitril/valsartan). Followed by cardiology and heart failure clinics. BNP unreliable given Sacubitril. Clinically, he is not in heart failure and has no lower extremity edema on my exam. Furthermore chest Ct without any signs of pulmonary edema.  -resume home diuretic -resume home carvedilol (start at half dose) and entresto (will stagger)  Pulmonary #Acute Hypoxic and Hypercapnic Respiratory Failure #COPD Exacerbation  History of COPD, GOLD III-E, last FEV1 was 33% of predicted, maintained  on LABA/ICS, presents for exacerbation secondary to URTI. Does have a history of recurrent exacerbations. Also presenting with hypoxic and hypercapnic respiratory failure requiring BiPAP support, initial blood gas showed a pH of 7.32 and CO2 of 52. Patient remains on BiPAP, which we will transition to qHS as his clinical condition improves (after repeat VBG at noon). Continue with steroids (40 mg of IV methylprednisolone daily), nebs (SABA/SAMA q6hours standing) and antibiotics for the management of his COPD exacerbation.  Given patient is a chronic CO2 retainer, he will benefit from nocturnal BiPAP moving forward at home. He will also need to be on triple therapy for COPD (LABA/LAMA/ICS). Patient should quit smoking, receive his PCV-20/influenza/COVID/Pertussis vaccinations, and would consider initiation of azithromycin vs roflumilast should he continue to exacerbate.  -Continue BiPAP, will need it nocturnally on discharge -repeat VBG, transition to Rentz if improved -will need LABA/LAMA/ICS combination on discharge -continue standing SABA/SAMA every 6 hours -continue steroids, IV solumedrol now, switch to prednisone tomorrow, 5 to 7 day course pending clinical improvement.  Gastrointestinal Resume diet, no active issues  Renal #CKD  History of CKD, baseline creatinine appears around 1.5-1.8. Currently at 1.77 and at baseline. No acute worsening in renal function, no hyperkalemia. Will resume anti-hypertensives and GDMT.  -monitor electrolytes and kidney function daily while in the hospital  Endocrine ICU glycemic protocol initiated  Hem/Onc  Enoxaparin subQ ordered for DVT prophylaxis  ID #COPD Exacerbation #Rhinovirus Infection  Presents with COPD exacerbation due to URTI, also covered with CAP given likelihood of super imposed bacterial infection. Ceftriaxone (5 days)/Azithromycin (3 days) is adequate.   Best Practice (right click and "Reselect all SmartList Selections" daily)    Diet/type: Regular consistency (see orders) DVT prophylaxis: LMWH GI prophylaxis: N/A Lines: N/A Foley:  N/A Code Status:  full code Last date of multidisciplinary goals of care discussion [06/05/2022]  Labs   CBC: Recent Labs  Lab 06/04/22 1856 06/05/22 0514  WBC 9.9 8.8  NEUTROABS 8.2*  --   HGB 16.3 14.8  HCT 51.0 45.5  MCV 92.4 90.5  PLT 153 124*    Basic Metabolic Panel: Recent Labs  Lab 06/04/22 1856 06/05/22 0514  NA 140 140  K 4.3 4.1  CL 103 103  CO2 26 28  GLUCOSE 124* 123*  BUN 33* 33*  CREATININE 1.84* 1.77*  CALCIUM 9.0 8.6*  MG  --  2.2  PHOS  --  4.2   GFR: Estimated Creatinine Clearance: 41.5 mL/min (A) (by C-G formula based on SCr of 1.77 mg/dL (H)). Recent Labs  Lab 06/04/22 1856 06/05/22 0514  PROCALCITON <0.10 0.12  WBC 9.9 8.8  LATICACIDVEN 1.7  --     Liver Function Tests: No results for input(s): "AST", "ALT", "ALKPHOS", "BILITOT", "PROT", "ALBUMIN" in the last 168 hours. No results for input(s): "LIPASE", "AMYLASE" in the last 168 hours. No results for input(s): "AMMONIA" in the last 168 hours.  ABG    Component Value Date/Time   PHART 7.32 (L) 06/04/2022 2004   PCO2ART  52 (H) 06/04/2022 2004   PO2ART 162 (H) 06/04/2022 2004   HCO3 26.4 06/04/2022 2004   ACIDBASEDEF 0.1 06/04/2022 2004   O2SAT 100 06/04/2022 2004     Coagulation Profile: No results for input(s): "INR", "PROTIME" in the last 168 hours.  Cardiac Enzymes: No results for input(s): "CKTOTAL", "CKMB", "CKMBINDEX", "TROPONINI" in the last 168 hours.  HbA1C: Hgb A1c MFr Bld  Date/Time Value Ref Range Status  10/08/2020 06:46 PM 5.5 4.8 - 5.6 % Final    Comment:    (NOTE) Pre diabetes:          5.7%-6.4%  Diabetes:              >6.4%  Glycemic control for   <7.0% adults with diabetes   08/13/2020 12:38 PM 5.9 4.6 - 6.5 % Final    Comment:    Glycemic Control Guidelines for People with Diabetes:Non Diabetic:  <6%Goal of Therapy: <7%Additional Action  Suggested:  >8%     CBG: Recent Labs  Lab 06/04/22 2133  GLUCAP 120*    Past Medical History:  He,  has a past medical history of Alcoholism and drug addiction in family, Arthritis, CHF (congestive heart failure), Chronic HFrEF (heart failure with reduced ejection fraction), Chronic kidney disease, COPD (chronic obstructive pulmonary disease), Hypertension, Irregular heart beat, NICM (nonischemic cardiomyopathy), Nonobstructive Coronary artery disease, Positive TB test, and Seizure.   Surgical History:   Past Surgical History:  Procedure Laterality Date   CARDIAC CATHETERIZATION     COLONOSCOPY WITH PROPOFOL N/A 08/23/2021   Procedure: COLONOSCOPY WITH PROPOFOL;  Surgeon: Toney Reil, MD;  Location: [facility] ENDOSCOPY;  Service: Gastroenterology;  Laterality: N/A;   RIGHT/LEFT HEART CATH AND CORONARY ANGIOGRAPHY N/A 07/30/2020   Procedure: RIGHT/LEFT HEART CATH AND CORONARY ANGIOGRAPHY poss PCI;  Surgeon: Iran Ouch, MD;  Location: [facility] INVASIVE CV LAB;  Service: Cardiovascular;  Laterality: N/A;   SUBQ ICD IMPLANT N/A 10/15/2020   Procedure: SUBQ ICD IMPLANT;  Surgeon: Lanier Prude, MD;  Location: [facility] INVASIVE CV LAB;  Service: Cardiovascular;  Laterality: N/A;   TONSILLECTOMY       Social History:   reports that he quit smoking about 5 years ago. His smoking use included cigarettes. He has a 12.50 pack-year smoking history. He has never used smokeless tobacco. He reports that he does not currently use drugs after having used the following drugs: "Crack" cocaine, Heroin, Marijuana, and LSD. He reports that he does not drink alcohol.   Family History:  His family history includes Alcohol abuse in his daughter, father, mother, and sister; Asthma in his sister; Depression in his daughter and sister; Diabetes in his mother; Drug abuse in his daughter and sister; Early death in his daughter; Hyperlipidemia in his mother; Hypertension in his daughter, father, mother, and sister.    Allergies Allergies  Allergen Reactions   Penicillins Hives    Has patient had a PCN reaction causing immediate rash, facial/tongue/throat swelling, SOB or lightheadedness with hypotension: Yes Has patient had a PCN reaction causing severe rash involving mucus membranes or skin necrosis: No Has patient had a PCN reaction that required hospitalization: No Has patient had a PCN reaction occurring within the last 10 years: No If all of the above answers are "NO", then may proceed with Cephalosporin use. Has patient had a PCN reaction causing immediate rash, facial/tongue/throat swelling, SOB or lightheadedness with hypotension: Yes Has patient had a PCN reaction causing severe rash involving mucus membranes or skin necrosis: No Has  patient had a PCN reaction that required hospitalization: No Has patient had a PCN reaction occurring within the last 10 years: No If all of the above answers are "NO", then may proceed with Cephalosporin use. Has patient had a PCN reaction causing immediate rash, facial/tongue/throat swelling, SOB or lightheadedness with hypotension: Yes Has patient had a PCN reaction causing severe rash involving mucus membranes or skin necrosis: No Has patient had a PCN reaction that required hospitalization: No Has patient had a PCN reaction occurring within the last 10 years: No If all of the above answers are "NO", then may proceed with Cephalosporin use.     Home Medications  Prior to Admission medications   Medication Sig Start Date End Date Taking? Authorizing Provider  acetaminophen (TYLENOL) 325 MG tablet Take 2 tablets (650 mg total) by mouth every 6 (six) hours as needed for fever. 10/16/20   Elgergawy, Leana Roeawood S, MD  albuterol (VENTOLIN HFA) 108 (90 Base) MCG/ACT inhaler Inhale 2 puffs into the lungs every 6 (six) hours as needed for wheezing or shortness of breath. 05/17/22   Allegra GranaArnett, Margaret G, FNP  aspirin EC 81 MG tablet Take 1 tablet (81 mg total) by mouth daily.  07/26/18   Iran OuchArida, Muhammad A, MD  benzonatate (TESSALON) 100 MG capsule Take 1 capsule (100 mg total) by mouth 3 (three) times daily as needed for cough. 06/15/21   Allegra GranaArnett, Margaret G, FNP  budesonide-formoterol (SYMBICORT) 160-4.5 MCG/ACT inhaler TAKE 2 PUFFS BY MOUTH TWICE A DAY 09/08/21   Allegra GranaArnett, Margaret G, FNP  carvedilol (COREG) 25 MG tablet Take 1 tablet (25 mg total) by mouth 2 (two) times daily. 11/02/21   Iran OuchArida, Muhammad A, MD  clotrimazole (LOTRIMIN) 1 % cream APPLY TO AFFECTED AREA TWICE A DAY 10/13/21   Allegra GranaArnett, Margaret G, FNP  ENTRESTO 97-103 MG TAKE 1 TABLET BY MOUTH TWICE A DAY 03/09/22   Allegra GranaArnett, Margaret G, FNP  FARXIGA 10 MG TABS tablet TAKE 1 TABLET BY MOUTH EVERY DAY BEFORE BREAKFAST 03/09/22   Allegra GranaArnett, Margaret G, FNP  feeding supplement (ENSURE ENLIVE / ENSURE PLUS) LIQD Take 237 mLs by mouth 3 (three) times daily between meals. 10/14/20   Darlin PriestlyLai, Tina, MD  furosemide (LASIX) 40 MG tablet Take 2 tablets (80 mg total) by mouth daily. 01/07/22   Delma FreezeHackney, Tina A, FNP  hydrocortisone 2.5 % cream Apply topically to face qhs t thur and Saturday for rash 01/13/22   Deirdre EvenerKowalski, David C, MD  ipratropium-albuterol (DUONEB) 0.5-2.5 (3) MG/3ML SOLN Inhale 3 mLs into the lungs every 4 (four) hours as needed. 12/20/21 12/15/22  [provider]  ketoconazole (NIZORAL) 2 % cream Apply topically to face qhs on m w Friday for rash 01/13/22   Deirdre EvenerKowalski, David C, MD  predniSONE (STERAPRED UNI-PAK 21 TAB) 10 MG (21) TBPK tablet Start 60 mg po daily, taper 10 mg daily until finish 03/19/22   Delfino LovettShah, Vipul, MD  tamsulosin (FLOMAX) 0.4 MG CAPS capsule TAKE 1 CAPSULE BY MOUTH EVERY DAY 03/09/22   Stoioff, Verna CzechScott C, MD  triamcinolone (KENALOG) 0.025 % cream Apply 1 application. topically 2 (two) times daily. Do not use longer than 7 days due to skin discoloration 07/12/21   Allegra GranaArnett, Margaret G, FNP  zolpidem (AMBIEN) 5 MG tablet Take 1 tablet (5 mg total) by mouth at bedtime as needed for sleep. 03/19/22 04/18/22  Delfino LovettShah,  Vipul, MD     Critical care time: 65 minutes     Raechel ChuteKhabib Chrishaun Sasso, MD [facility] 06/05/2022 12:43  PM

## 2022-06-05 NOTE — Progress Notes (Signed)
  Echocardiogram 2D Echocardiogram has been performed.  Lenor Coffin 06/05/2022, 10:04 AM

## 2022-06-05 NOTE — Plan of Care (Signed)

## 2022-06-05 NOTE — Plan of Care (Signed)
Discussed with patient plan of care for the evening, pain management and wearing Bipap tonight with some teach back displayed. Patient has taken oxygen 1L off at this time.  Problem: Education: Goal: Knowledge of General Education information will improve Description: Including pain rating scale, medication(s)/side effects and non-pharmacologic comfort measures Outcome: Progressing   Problem: Health Behavior/Discharge Planning: Goal: Ability to manage health-related needs will improve Outcome: Progressing

## 2022-06-06 DIAGNOSIS — B348 Other viral infections of unspecified site: Secondary | ICD-10-CM | POA: Diagnosis present

## 2022-06-06 LAB — PHOSPHORUS: Phosphorus: 3 mg/dL (ref 2.5–4.6)

## 2022-06-06 LAB — GLUCOSE, CAPILLARY
Glucose-Capillary: 118 mg/dL — ABNORMAL HIGH (ref 70–99)
Glucose-Capillary: 126 mg/dL — ABNORMAL HIGH (ref 70–99)
Glucose-Capillary: 139 mg/dL — ABNORMAL HIGH (ref 70–99)
Glucose-Capillary: 123 mg/dL — ABNORMAL HIGH (ref 70–99)

## 2022-06-06 LAB — BASIC METABOLIC PANEL
Anion gap: 8 (ref 5–15)
BUN: 50 mg/dL — ABNORMAL HIGH (ref 8–23)
CO2: 29 mmol/L (ref 22–32)
Calcium: 8.5 mg/dL — ABNORMAL LOW (ref 8.9–10.3)
Chloride: 101 mmol/L (ref 98–111)
Creatinine, Ser: 1.93 mg/dL — ABNORMAL HIGH (ref 0.61–1.24)
GFR, Estimated: 37 mL/min — ABNORMAL LOW (ref >60)
Glucose, Bld: 137 mg/dL — ABNORMAL HIGH (ref 70–99)
Potassium: 5 mmol/L (ref 3.5–5.1)
Sodium: 138 mmol/L (ref 135–145)

## 2022-06-06 LAB — MAGNESIUM: Magnesium: 2.5 mg/dL — ABNORMAL HIGH (ref 1.7–2.4)

## 2022-06-06 LAB — LEGIONELLA PNEUMOPHILA SEROGP 1 UR AG: L. pneumophila Serogp 1 Ur Ag: NEGATIVE

## 2022-06-06 LAB — CULTURE, BLOOD (ROUTINE X 2)

## 2022-06-06 LAB — PROCALCITONIN: Procalcitonin: 0.14 ng/mL

## 2022-06-06 MED ORDER — MIDAZOLAM HCL 2 MG/2ML IJ SOLN: 2.0000 mg | Freq: Once | INTRAMUSCULAR | Status: DC

## 2022-06-06 MED ORDER — PREDNISONE 20 MG PO TABS
40.0000 mg | ORAL_TABLET | Freq: Every day | ORAL | Status: DC
  Administered 2022-06-07: 40 mg via ORAL
  Filled 2022-06-06: qty 2

## 2022-06-06 MED ORDER — LORAZEPAM 2 MG/ML IJ SOLN
0.5000 mg | INTRAMUSCULAR | Status: DC | PRN
  Administered 2022-06-06: 0.5 mg via INTRAVENOUS
  Filled 2022-06-06: qty 1

## 2022-06-06 NOTE — Hospital Course (Signed)
HPI on admission, taken from PCCM: "72 y.o male with significant PMH of  HFrEF last LVEF of 25-30%, NICM, Vfib cardiac arrest s/p AICD, COPD, HTN, HLD, polysubstance abuse, current everyday smoker seizure disorder and CKD who presented to the ED with chief complaints of shortness of breath associated with productive cough, wheezing, fevers and chills x 3 days.   ED Course: Initial vital signs showed HR of 110 beats/minute, BP 170/122 mm Hg, the RR 40 breaths/minute, and the oxygen saturation 100 % on NRB  and a temperature of 100.8 (38.2 C). Patient was with increased work of breathing and was immediately placed on BiPAP.  Patient received 2 DuoNeb's treatment, 1 albuterol and 125 mg of Solu-Medrol by EMS prior to arrival.   Patient recevied broad-spectrum IV antibiotics due to suspected community-acquired pneumonia given history of severe COPD now with exacerbation (wheezing, productive cough and fevers). He also received IV Lasix and Nitropaste for concern of pulmonary edema.  Patient remained on BiPAP still with increased work of breathing.  Due to high risk for intubation, PCCM consulted for admission."  TR[facility] assumed care 06/06/22.  Pt improved, off BiPAP and supplemental oxygen with stable O2 sats this AM.  Stable for transfer to the floor. Further hospital course and management as outlined below.

## 2022-06-06 NOTE — Assessment & Plan Note (Signed)
Appears not on medication. Outpatient follow up.

## 2022-06-06 NOTE — Assessment & Plan Note (Signed)
Stable.  Monitor BMP. °

## 2022-06-06 NOTE — Assessment & Plan Note (Signed)
Supportive care. Mgmt of COPD exacerbation as outlined. O2 as needed.

## 2022-06-06 NOTE — Plan of Care (Signed)
  Problem: Education: Goal: Knowledge of General Education information will improve Description: Including pain rating scale, medication(s)/side effects and non-pharmacologic comfort measures Outcome: Progressing   Problem: Clinical Measurements: Goal: Respiratory complications will improve Outcome: Progressing   Problem: Activity: Goal: Risk for activity intolerance will decrease Outcome: Progressing   Problem: Nutrition: Goal: Adequate nutrition will be maintained Outcome: Progressing   Problem: Elimination: Goal: Will not experience complications related to urinary retention Outcome: Progressing   Problem: Pain Managment: Goal: General experience of comfort will improve Outcome: Progressing   Problem: Safety: Goal: Ability to remain free from injury will improve Outcome: Progressing   Problem: Skin Integrity: Goal: Risk for impaired skin integrity will decrease Outcome: Progressing

## 2022-06-06 NOTE — Assessment & Plan Note (Signed)
See HFrEF

## 2022-06-06 NOTE — Assessment & Plan Note (Signed)
On Coreg, Entresto, Lasix BP's overall stable.

## 2022-06-06 NOTE — Progress Notes (Signed)
Pt arrived to 218 via wheelchair from ICU. Received report from [place], [place]. See assessment. Will continue to monitor.

## 2022-06-06 NOTE — Progress Notes (Signed)
PHARMACY CONSULT NOTE - FOLLOW UP  Pharmacy Consult for Electrolyte Monitoring and Replacement   Recent Labs: Potassium (mmol/L)  Date Value  06/06/2022 5.0   Magnesium (mg/dL)  Date Value  96/78/9381 2.5 (H)   Calcium (mg/dL)  Date Value  01/75/1025 8.5 (L)   Albumin (g/dL)  Date Value  85/27/7824 2.7 (L)  08/18/2017 4.0   Phosphorus (mg/dL)  Date Value  23/53/6144 3.0   Sodium (mmol/L)  Date Value  06/06/2022 138  01/11/2021 144     Assessment: 72 y.o male with significant PMH of  HFrEF last LVEF of 25-30%, NICM, Vfib cardiac arrest s/p AICD, COPD, HTN, HLD, polysubstance abuse, current everyday smoker seizure disorder and CKD who presented to the ED with chief complaints of shortness of breath associated with productive cough, wheezing, fevers and chills x 3 days. Patient was transferred to ICU d/t high risk of intubation. Pharmacy has been consulted to monitor and replace electrolytes while under PCCM care.  Goal of Therapy:  Electrolytes WNL  Plan:  No replacement currently indicated Will recheck electrolytes with AM labs  Lowella Bandy ,PharmD Clinical Pharmacist 06/06/2022 7:03 AM

## 2022-06-06 NOTE — Assessment & Plan Note (Signed)
Due to rhinovirus infection. COPD GOLD III-E, last FEV1 was 33% of predicted, maintained on LABA/ICS  --Nocturnal BiPAP for chronic CO2 retention --TOC consult for home NIV/BiPAP --LABA/LAMA/ICS combination on discharge  --Continue SABA/SAMA every 6 hours --Continue steroids, IV solumedrol now, switch to prednisone tomorrow, 5 to 7 day course pending clinical improvement.

## 2022-06-06 NOTE — Assessment & Plan Note (Signed)
Initially on BiPAP in ICU.  Blood gas and respiratory status improved significantly. 4/8 - on room air, O2 sats stable --Transfer out of ICU --Continue mgmt of COPD exac as outlined --Supplement O2 PRN, goal sats 88-94%

## 2022-06-06 NOTE — Assessment & Plan Note (Signed)
Noted. Monitor.

## 2022-06-06 NOTE — Assessment & Plan Note (Signed)
Clinically appears euvolemic, compensated at this time. EF 30%, on GDMT (dapagliflozin, carvedilol, sacubitril/valsartan). Followed by cardiology and heart failure clinics. BNP unreliable given Sacubitril.  --Coreg resumed at half dose, Entresto, home diuretic  --Monitor volume status

## 2022-06-06 NOTE — Assessment & Plan Note (Signed)
No acute issues. Follow up outpatient

## 2022-06-06 NOTE — Progress Notes (Signed)
Report called to receiving RN [id number]. Patient with no complaints at this time. Will transfer to new room.

## 2022-06-06 NOTE — Progress Notes (Signed)
Progress Note   Patient: Jonathon Rios UKG:[id number] DOB: 1951-01-20 DOA: 06/04/2022     2 DOS: the patient was seen and examined on 06/06/2022   Brief hospital course: HPI on admission, taken from PCCM: "72 y.o male with significant PMH of  HFrEF last LVEF of 25-30%, NICM, Vfib cardiac arrest s/p AICD, COPD, HTN, HLD, polysubstance abuse, current everyday smoker seizure disorder and CKD who presented to the ED with chief complaints of shortness of breath associated with productive cough, wheezing, fevers and chills x 3 days.   ED Course: Initial vital signs showed HR of 110 beats/minute, BP 170/122 mm Hg, the RR 40 breaths/minute, and the oxygen saturation 100 % on NRB  and a temperature of 100.8 (38.2 C). Patient was with increased work of breathing and was immediately placed on BiPAP.  Patient received 2 DuoNeb's treatment, 1 albuterol and 125 mg of Solu-Medrol by EMS prior to arrival.   Patient recevied broad-spectrum IV antibiotics due to suspected community-acquired pneumonia given history of severe COPD now with exacerbation (wheezing, productive cough and fevers). He also received IV Lasix and Nitropaste for concern of pulmonary edema.  Patient remained on BiPAP still with increased work of breathing.  Due to high risk for intubation, PCCM consulted for admission."  TRH assumed care 06/06/22.  Pt improved, off BiPAP and supplemental oxygen with stable O2 sats this AM.  Stable for transfer to the floor. Further hospital course and management as outlined below.  Assessment and Plan: * Acute on chronic respiratory failure with hypoxia and hypercapnia Initially on BiPAP in ICU.  Blood gas and respiratory status improved significantly. 4/8 - on room air, O2 sats stable --Transfer out of ICU --Continue mgmt of COPD exac as outlined --Supplement O2 PRN, goal sats 88-94%  Rhinovirus infection Supportive care. Mgmt of COPD exacerbation as outlined. O2 as needed.  COPD with acute  exacerbation Due to rhinovirus infection. COPD GOLD III-E, last FEV1 was 33% of predicted, maintained on LABA/ICS  --Nocturnal BiPAP for chronic CO2 retention --TOC consult for home NIV/BiPAP --LABA/LAMA/ICS combination on discharge  --Continue SABA/SAMA every 6 hours --Continue steroids, IV solumedrol now, switch to prednisone tomorrow, 5 to 7 day course pending clinical improvement.  NICM (nonischemic cardiomyopathy) See HFrEF  Chronic HFrEF (heart failure with reduced ejection fraction) Clinically appears euvolemic, compensated at this time. EF 30%, on GDMT (dapagliflozin, carvedilol, sacubitril/valsartan). Followed by cardiology and heart failure clinics. BNP unreliable given Sacubitril.  --Coreg resumed at half dose, Entresto, home diuretic  --Monitor volume status  Chronic kidney disease, stage 3b Stable. Monitor BMP.  HTN (hypertension) On Coreg, Entresto, Lasix BP's overall stable.  PTSD (post-traumatic stress disorder) Noted. Monitor.  Moderate recurrent major depression Appears not on medication. Outpatient follow up.  Chronic hepatitis C No acute issues. Follow up outpatient        Subjective: Pt seen in ICU this AM, sleeping but woke to voice.  He does not interact much or make any conversation, briefly answers yes/no questions.  Breathing is better, no wheezing. No other complaints. Ate some breakfast.  Physical Exam: Vitals:   06/06/22 1300 06/06/22 1400 06/06/22 1500 06/06/22 1555  BP: 123/88 (!) 131/99 (!) 143/100 (!) 129/99  Pulse: 76 80 80 85  Resp: (!) 24 (!) 22 16 18   Temp:    98 F (36.7 C)  TempSrc:      SpO2: 94% 95% 93% 98%  Weight:      Height:        General exam:  sleeping, woke to voice, no acute distress HEENT: oist mucus membranes, hearing grossly normal  Respiratory system: generally diminished, no wheezes, rales or rhonchi, normal respiratory effort. On room air Cardiovascular system: normal S1/S2, RRR,  no pedal edema.    Gastrointestinal system: soft, NT, ND, no HSM felt, +bowel sounds. Central nervous system: A&O x3. no gross focal neurologic deficits, normal speech Extremities: moves all, no edema, normal tone Skin: dry, intact, normal temperature Psychiatry: normal mood, congruent affec '   Data Reviewed:  Notable labs --- glucose 137, BUN 50, Cr 1.93, Ca 8.5, Mg 2.5, procal 0.14   Family Communication: None present, will attempt to call as time allows  Disposition: Status is: Inpatient Remains inpatient appropriate because: Close monitoring x 24-48  more hours for further clinical improvement and stability.    Planned Discharge Destination: Home    Time spent: 42 minutes  Author: Pennie Banter, DO 06/06/2022 5:11 PM  For on call review [email or web address].

## 2022-06-07 ENCOUNTER — Encounter: Payer: Self-pay | Admitting: Student in an Organized Health Care Education/Training Program

## 2022-06-07 LAB — CBC
HCT: 50.2 % (ref 39.0–52.0)
Hemoglobin: 16.3 g/dL (ref 13.0–17.0)
MCH: 29.2 pg (ref 26.0–34.0)
MCHC: 32.5 g/dL (ref 30.0–36.0)
MCV: 89.8 fL (ref 80.0–100.0)
Platelets: 126 10*3/uL — ABNORMAL LOW (ref 150–400)
RBC: 5.59 MIL/uL (ref 4.22–5.81)
RDW: 14.3 % (ref 11.5–15.5)
WBC: 10.1 10*3/uL (ref 4.0–10.5)
nRBC: 0 % (ref 0.0–0.2)

## 2022-06-07 LAB — BASIC METABOLIC PANEL
Anion gap: 8 (ref 5–15)
BUN: 49 mg/dL — ABNORMAL HIGH (ref 8–23)
CO2: 29 mmol/L (ref 22–32)
Calcium: 8.4 mg/dL — ABNORMAL LOW (ref 8.9–10.3)
Chloride: 101 mmol/L (ref 98–111)
Creatinine, Ser: 1.56 mg/dL — ABNORMAL HIGH (ref 0.61–1.24)
GFR, Estimated: 48 mL/min — ABNORMAL LOW (ref >60)
Glucose, Bld: 105 mg/dL — ABNORMAL HIGH (ref 70–99)
Potassium: 4.2 mmol/L (ref 3.5–5.1)
Sodium: 138 mmol/L (ref 135–145)

## 2022-06-07 LAB — PROCALCITONIN: Procalcitonin: <0.1 ng/mL

## 2022-06-07 LAB — GLUCOSE, CAPILLARY: Glucose-Capillary: 156 mg/dL — ABNORMAL HIGH (ref 70–99)

## 2022-06-07 MED ORDER — IPRATROPIUM-ALBUTEROL 0.5-2.5 (3) MG/3ML IN SOLN: 3.0000 mL | Freq: Two times a day (BID) | RESPIRATORY_TRACT | Status: DC

## 2022-06-07 MED ORDER — CEFDINIR 300 MG PO CAPS: 300.0000 mg | ORAL_CAPSULE | Freq: Two times a day (BID) | ORAL | 0 refills | Status: AC

## 2022-06-07 MED ORDER — CARVEDILOL 25 MG PO TABS: 12.5000 mg | ORAL_TABLET | Freq: Two times a day (BID) | ORAL | 2 refills | Status: DC

## 2022-06-07 MED ORDER — PREDNISONE 10 MG PO TABS: ORAL_TABLET | ORAL | 0 refills | Status: AC

## 2022-06-07 MED ORDER — NICOTINE 21 MG/24HR TD PT24: 21.0000 mg | MEDICATED_PATCH | Freq: Every day | TRANSDERMAL | 0 refills | Status: AC

## 2022-06-07 MED ORDER — TRELEGY ELLIPTA 100-62.5-25 MCG/ACT IN AEPB: 1.0000 | INHALATION_SPRAY | Freq: Every day | RESPIRATORY_TRACT | 1 refills | Status: AC

## 2022-06-07 NOTE — Progress Notes (Addendum)
Mobility Specialist - Progress Note  Nurse requested Mobility Specialist to perform oxygen saturation test with pt which includes removing pt from oxygen both at rest and while ambulating.  Below are the results from that testing.     Patient Saturations on Room Air at Rest = spO2 94%  Patient Saturations on Room Air while Ambulating = sp02 92-94% .  Rested and performed pursed lip breathing for 1 minute with sp02 at 92%.  Patient Saturations on Room Air of oxygen while Ambulating = sp02 90% after 320 ft  At end of testing pt left in room air. Reported results to nurse.       06/07/22 0917  Mobility  Activity Ambulated independently in hallway;Stood at bedside;Dangled on edge of bed  Level of Assistance Independent  Assistive Device None  Distance Ambulated (ft) 320 ft  Range of Motion/Exercises Active  Activity Response Tolerated well  Mobility Referral Yes  $Mobility charge 1 Mobility   Pt sleeping in bed and aroused to knock upon entry on RA. Pt STS and ambulates around NS for 2 laps with no AD. Pt returned to bed and left with needs in reach.   Johnathan Hausen Mobility Specialist 06/07/22, 9:26 AM

## 2022-06-07 NOTE — Discharge Summary (Signed)
Physician Discharge Summary   Patient: Jonathon Rios MRN: [id number] DOB: 12/14/1950  Admit date:     06/04/2022  Discharge date: 06/07/22  Discharge Physician: Pennie Banter   PCP: Pcp, No   Recommendations at discharge:    Follow up with Pulmonology Follow up with Primary Care Pt declined home nocturnal NIV/BiPAP - please revisit this Repeat BMP, CBC in 1-2 weeks Follow up on smoking cessation efforts  Discharge Diagnoses: Active Problems:   COPD with acute exacerbation   Rhinovirus infection   HTN (hypertension)   Chronic kidney disease, stage 3b   Chronic HFrEF (heart failure with reduced ejection fraction)   NICM (nonischemic cardiomyopathy)   Moderate recurrent major depression   PTSD (post-traumatic stress disorder)   Chronic hepatitis C  Principal Problem (Resolved):   Acute on chronic respiratory failure with hypoxia and hypercapnia  Hospital Course: HPI on admission, taken from PCCM: "72 y.o male with significant PMH of  HFrEF last LVEF of 25-30%, NICM, Vfib cardiac arrest s/p AICD, COPD, HTN, HLD, polysubstance abuse, current everyday smoker seizure disorder and CKD who presented to the ED with chief complaints of shortness of breath associated with productive cough, wheezing, fevers and chills x 3 days.   ED Course: Initial vital signs showed HR of 110 beats/minute, BP 170/122 mm Hg, the RR 40 breaths/minute, and the oxygen saturation 100 % on NRB  and a temperature of 100.8 (38.2 C). Patient was with increased work of breathing and was immediately placed on BiPAP.  Patient received 2 DuoNeb's treatment, 1 albuterol and 125 mg of Solu-Medrol by EMS prior to arrival.   Patient recevied broad-spectrum IV antibiotics due to suspected community-acquired pneumonia given history of severe COPD now with exacerbation (wheezing, productive cough and fevers). He also received IV Lasix and Nitropaste for concern of pulmonary edema.  Patient remained on BiPAP still with  increased work of breathing.  Due to high risk for intubation, PCCM consulted for admission."  TRH assumed care 06/06/22.  Pt improved, off BiPAP and supplemental oxygen with stable O2 sats this AM.  Stable for transfer to the floor. Further hospital course and management as outlined below.  Assessment and Plan: * Acute on chronic respiratory failure with hypoxia and hypercapnia-resolved as of 06/07/2022 Initially on BiPAP in ICU.  Blood gas and respiratory status improved significantly. 4/8 - on room air, O2 sats stable --Transfer out of ICU --Continue mgmt of COPD exac as outlined --Supplement O2 PRN, goal sats 88-94%  Rhinovirus infection Supportive care. Mgmt of COPD exacerbation as outlined. O2 as needed.  COPD with acute exacerbation Due to rhinovirus infection. COPD GOLD III-E, last FEV1 was 33% of predicted, maintained on LABA/ICS  --Nocturnal BiPAP for chronic CO2 retention   Patient adamantly declined this.   Pulmonology can address at follow up --Treated with scheduled Duonebs, IV steroids --Discharge on prednisone taper, Trelegy --Follow up with Pulmonology  NICM (nonischemic cardiomyopathy) See HFrEF  Chronic HFrEF (heart failure with reduced ejection fraction) Clinically appears euvolemic, compensated at this time. EF 30%, on GDMT (dapagliflozin, carvedilol, sacubitril/valsartan). Followed by cardiology and heart failure clinics. BNP unreliable given Sacubitril.  --Coreg resumed at half dose, Entresto, home diuretic  --Monitor volume status  Chronic kidney disease, stage 3b Stable. Monitor BMP.  HTN (hypertension) On Coreg, Entresto, Lasix BP's overall stable.  PTSD (post-traumatic stress disorder) Noted. Monitor.  Moderate recurrent major depression Appears not on medication. Outpatient follow up.  Chronic hepatitis C No acute issues. Follow up outpatient  Consultants: PCCM Procedures performed: None  Disposition: Home Diet  recommendation:  Discharge Diet Orders (From admission, onward)     Start     Ordered   06/07/22 0000  Diet - low sodium heart healthy        06/07/22 1000           Cardiac diet DISCHARGE MEDICATION: Allergies as of 06/07/2022       Reactions   Penicillins Hives   Has patient had a PCN reaction causing immediate rash, facial/tongue/throat swelling, SOB or lightheadedness with hypotension: Yes Has patient had a PCN reaction causing severe rash involving mucus membranes or skin necrosis: No Has patient had a PCN reaction that required hospitalization: No Has patient had a PCN reaction occurring within the last 10 years: No If all of the above answers are "NO", then may proceed with Cephalosporin use. Has patient had a PCN reaction causing immediate rash, facial/tongue/throat swelling, SOB or lightheadedness with hypotension: Yes Has patient had a PCN reaction causing severe rash involving mucus membranes or skin necrosis: No Has patient had a PCN reaction that required hospitalization: No Has patient had a PCN reaction occurring within the last 10 years: No If all of the above answers are "NO", then may proceed with Cephalosporin use. Has patient had a PCN reaction causing immediate rash, facial/tongue/throat swelling, SOB or lightheadedness with hypotension: Yes Has patient had a PCN reaction causing severe rash involving mucus membranes or skin necrosis: No Has patient had a PCN reaction that required hospitalization: No Has patient had a PCN reaction occurring within the last 10 years: No If all of the above answers are "NO", then may proceed with Cephalosporin use.        Medication List     STOP taking these medications    budesonide-formoterol 160-4.5 MCG/ACT inhaler Commonly known as: Symbicort   predniSONE 10 MG (21) Tbpk tablet Commonly known as: STERAPRED UNI-PAK 21 TAB Replaced by: predniSONE 10 MG tablet       TAKE these medications    acetaminophen 325  MG tablet Commonly known as: TYLENOL Take 2 tablets (650 mg total) by mouth every 6 (six) hours as needed for fever.   albuterol 108 (90 Base) MCG/ACT inhaler Commonly known as: VENTOLIN HFA Inhale 2 puffs into the lungs every 6 (six) hours as needed for wheezing or shortness of breath.   aspirin EC 81 MG tablet Take 1 tablet (81 mg total) by mouth daily.   benzonatate 100 MG capsule Commonly known as: TESSALON Take 1 capsule (100 mg total) by mouth 3 (three) times daily as needed for cough.   carvedilol 25 MG tablet Commonly known as: COREG Take 0.5 tablets (12.5 mg total) by mouth 2 (two) times daily. What changed: how much to take   cefdinir 300 MG capsule Commonly known as: OMNICEF Take 1 capsule (300 mg total) by mouth 2 (two) times daily for 2 days.   clotrimazole 1 % cream Commonly known as: LOTRIMIN APPLY TO AFFECTED AREA TWICE A DAY   Entresto 97-103 MG Generic drug: sacubitril-valsartan TAKE 1 TABLET BY MOUTH TWICE A DAY   Farxiga 10 MG Tabs tablet Generic drug: dapagliflozin propanediol TAKE 1 TABLET BY MOUTH EVERY DAY BEFORE BREAKFAST   feeding supplement Liqd Take 237 mLs by mouth 3 (three) times daily between meals.   furosemide 40 MG tablet Commonly known as: LASIX Take 2 tablets (80 mg total) by mouth daily.   hydrocortisone 2.5 % cream Apply topically to face qhs  t thur and Saturday for rash   ipratropium-albuterol 0.5-2.5 (3) MG/3ML Soln Commonly known as: DUONEB Inhale 3 mLs into the lungs every 4 (four) hours as needed.   ketoconazole 2 % cream Commonly known as: NIZORAL Apply topically to face qhs on m w Friday for rash   nicotine 21 mg/24hr patch Commonly known as: NICODERM CQ - dosed in mg/24 hours Place 1 patch (21 mg total) onto the skin daily.   predniSONE 10 MG tablet Commonly known as: DELTASONE Take 4 tablets (40 mg total) by mouth daily with breakfast for 2 days, THEN 3 tablets (30 mg total) daily with breakfast for 2 days,  THEN 2 tablets (20 mg total) daily with breakfast for 2 days, THEN 1 tablet (10 mg total) daily with breakfast for 2 days. Start taking on: June 07, 2022 Replaces: predniSONE 10 MG (21) Tbpk tablet   tamsulosin 0.4 MG Caps capsule Commonly known as: FLOMAX TAKE 1 CAPSULE BY MOUTH EVERY DAY   Trelegy Ellipta 100-62.5-25 MCG/ACT Aepb Generic drug: Fluticasone-Umeclidin-Vilant Inhale 1 puff into the lungs daily.   triamcinolone 0.025 % cream Commonly known as: KENALOG Apply 1 application. topically 2 (two) times daily. Do not use longer than 7 days due to skin discoloration   zolpidem 5 MG tablet Commonly known as: AMBIEN Take 1 tablet (5 mg total) by mouth at bedtime as needed for sleep.        Follow-up Information     Raechel Chutegayli, Khabib, MD. Go on 06/28/2022.   Specialty: Pulmonary Disease Why: Call to schedule Pulmonology follow up for COPD. Go at 9:00am. Contact information: [address] [place] [address] [phone]                Discharge Exam: Ceasar MonsFiled Weights   06/04/22 1918 06/05/22 0400 06/07/22 0612  Weight: 81.2 kg 74.5 kg 71.1 kg   General exam: awake, alert, no acute distress HEENT: atraumatic, clear conjunctiva, anicteric sclera, moist mucus membranes, hearing grossly normal  Respiratory system: CTAB but generally diminished throughout, no wheezes, rales or rhonchi, normal respiratory effort. Cardiovascular system: normal S1/S2, RRR, no JVD, murmurs, rubs, gallops, no pedal edema.   Gastrointestinal system: soft, NT, ND, no HSM felt, +bowel sounds. Central nervous system: A&O x3. no gross focal neurologic deficits, normal speech Extremities: moves all, no edema, normal tone Skin: dry, intact, normal temperature, normal color, No rashes, lesions or ulcers Psychiatry: normal mood, congruent affect, judgement and insight appear normal   Condition at discharge: stable  The results of significant diagnostics from this hospitalization  (including imaging, microbiology, ancillary and laboratory) are listed below for reference.   Imaging Studies: ECHOCARDIOGRAM COMPLETE  Result Date: 06/05/2022    ECHOCARDIOGRAM REPORT   Patient Name:   Jonathon Rios A Michaux Date of Exam: 06/05/2022 Medical Rec #:  [id number]       Height:       75.0 in Accession #:    [id number]      Weight:       164.2 lb Date of Birth:  07/18/1950       BSA:          2.018 m Patient Age:    71 years        BP:           131/83 mmHg Patient Gender: M               HR:           78 bpm. Exam  Location:  [facility] Procedure: 2D Echo Indications:     CHF I50.31  History:         Patient has prior history of Echocardiogram examinations, most                  recent 10/15/2020.  Sonographer:     Overton Mam RDCS Referring Phys:  [id number] Hubbard Hartshorn OUMA Diagnosing Phys: Jodelle Red MD  Sonographer Comments: Image acquisition challenging due to respiratory motion. IMPRESSIONS  1. Left ventricular ejection fraction, by estimation, is 25 to 30%. The left ventricle has severely decreased function. The left ventricle demonstrates global hypokinesis. The left ventricular internal cavity size was moderately dilated. There is mild eccentric left ventricular hypertrophy. Left ventricular diastolic parameters are consistent with Grade II diastolic dysfunction (pseudonormalization).  2. Right ventricular systolic function is normal. The right ventricular size is normal.  3. Right atrial size was mildly dilated.  4. The mitral valve is normal in structure. Trivial mitral valve regurgitation. No evidence of mitral stenosis.  5. The aortic valve is tricuspid. Aortic valve regurgitation is not visualized. No aortic stenosis is present.  6. The inferior vena cava is normal in size with <50% respiratory variability, suggesting right atrial pressure of 8 mmHg. Comparison(s): No significant change from prior study. FINDINGS  Left Ventricle: Left ventricular ejection fraction, by estimation, is 25  to 30%. The left ventricle has severely decreased function. The left ventricle demonstrates global hypokinesis. The left ventricular internal cavity size was moderately dilated. There is mild eccentric left ventricular hypertrophy. Left ventricular diastolic parameters are consistent with Grade II diastolic dysfunction (pseudonormalization). Right Ventricle: The right ventricular size is normal. Right vetricular wall thickness was not well visualized. Right ventricular systolic function is normal. Left Atrium: Left atrial size was normal in size. Right Atrium: Right atrial size was mildly dilated. Pericardium: There is no evidence of pericardial effusion. Mitral Valve: The mitral valve is normal in structure. Trivial mitral valve regurgitation. No evidence of mitral valve stenosis. Tricuspid Valve: The tricuspid valve is normal in structure. Tricuspid valve regurgitation is trivial. No evidence of tricuspid stenosis. Aortic Valve: The aortic valve is tricuspid. Aortic valve regurgitation is not visualized. No aortic stenosis is present. Aortic valve peak gradient measures 8.8 mmHg. Pulmonic Valve: The pulmonic valve was not well visualized. Pulmonic valve regurgitation is not visualized. No evidence of pulmonic stenosis. Aorta: The aortic root, ascending aorta, aortic arch and descending aorta are all structurally normal, with no evidence of dilitation or obstruction. Venous: The inferior vena cava is normal in size with less than 50% respiratory variability, suggesting right atrial pressure of 8 mmHg. IAS/Shunts: The atrial septum is grossly normal.  LEFT VENTRICLE PLAX 2D LVIDd:         6.00 cm      Diastology LVIDs:         5.10 cm      LV e' medial:    3.48 cm/s LV PW:         1.60 cm      LV E/e' medial:  19.4 LV IVS:        1.20 cm      LV e' lateral:   6.31 cm/s LVOT diam:     2.50 cm      LV E/e' lateral: 10.7 LV SV:         91 LV SV Index:   45 LVOT Area:     4.91 cm  LV Volumes (MOD) LV vol d, MOD A2C:  153.0 ml LV vol d, MOD A4C: 227.0 ml LV vol s, MOD A2C: 145.0 ml LV vol s, MOD A4C: 121.0 ml LV SV MOD A2C:     8.0 ml LV SV MOD A4C:     227.0 ml LV SV MOD BP:      61.1 ml RIGHT VENTRICLE RV Basal diam:  3.40 cm RV S prime:     14.40 cm/s TAPSE (M-mode): 2.6 cm LEFT ATRIUM             Index        RIGHT ATRIUM           Index LA diam:        3.40 cm 1.68 cm/m   RA Area:     16.30 cm LA Vol (A2C):   62.6 ml 31.02 ml/m  RA Volume:   42.30 ml  20.96 ml/m LA Vol (A4C):   21.6 ml 10.70 ml/m LA Biplane Vol: 39.5 ml 19.57 ml/m  AORTIC VALVE                 PULMONIC VALVE AV Area (Vmax): 3.58 cm     PV Vmax:        0.91 m/s AV Vmax:        148.00 cm/s  PV Peak grad:   3.3 mmHg AV Peak Grad:   8.8 mmHg     RVOT Peak grad: 3 mmHg LVOT Vmax:      108.00 cm/s LVOT Vmean:     70.000 cm/s LVOT VTI:       0.186 m  AORTA Ao Root diam: 3.80 cm Ao Asc diam:  3.60 cm MITRAL VALVE               TRICUSPID VALVE MV Area (PHT): 5.50 cm    TV Peak grad:   20.4 mmHg MV Decel Time: 138 msec    TV Vmax:        2.26 m/s MV E velocity: 67.60 cm/s MV A velocity: 80.20 cm/s  SHUNTS MV E/A ratio:  0.84        Systemic VTI:  0.19 m                            Systemic Diam: 2.50 cm Jodelle Red MD Electronically signed by Jodelle Red MD Signature Date/Time: 06/05/2022/2:19:53 PM    Final    CT CHEST WO CONTRAST  Result Date: 06/04/2022 CLINICAL DATA:  Aspiration. Shortness of breath onset 3 years ago worsening today. EXAM: CT CHEST WITHOUT CONTRAST TECHNIQUE: Multidetector CT imaging of the chest was performed following the standard protocol without IV contrast. RADIATION DOSE REDUCTION: This exam was performed according to the departmental dose-optimization program which includes automated exposure control, adjustment of the mA and/or kV according to patient size and/or use of iterative reconstruction technique. COMPARISON:  Portable chest today, chest radiograph 03/17/2022, CTA chest 03/17/2022, and chest CT no  contrast 07/15/2020 FINDINGS: Cardiovascular: Mild-to-moderate cardiomegaly, panchamber involvement again noted. Minimal chronic pericardial effusion anteriorly. Trace calcification proximal LAD coronary artery only. Central pulmonary veins are nondistended. There is minimal scattered calcific plaque of the aorta and great vessels. There is aortic tortuosity. Slightly aneurysmal aortic root and ascending segment both measuring 4.2 cm. Chronically prominent pulmonary trunk again measuring 3.5 cm. There is a lateral left chest wall ICD power source and a single presternal wire to the left arising from it, unchanged. Mediastinum/Nodes: No enlarged mediastinal or axillary lymph nodes.  Thyroid gland, trachea, and esophagus demonstrate no significant findings. The main bronchi are clear. Lungs/Pleura: Diffuse bronchial thickening. The lungs are moderately emphysematous with centrilobular changes predominating but with paraseptal changes also seen in the apices. Paraspinal scarring is chronically seen in the lower lobes, scattered linear scar-like opacities elsewhere in the bases. No pleural effusion, thickening or pneumothorax. Minimal linear scarring in both apices. There is breathing motion artifact. Chronic ground-glass disease posterior basal right lower lobe. There is a small amount of new ground-glass disease in the anterior basal segment of the right lower lobe most likely due to pneumonitis, alternatively could be interval new chronic ground-glass interstitial disease. No other focal infiltrate is seen through the breathing motion. No nodule is evident. Upper Abdomen: No acute upper abdominal findings without contrast. There is a 1.6 cm chronic cyst in the upper pole left kidney, Hounsfield density of 8.2. There is a 1.2 cm cyst medially in the interpolar right kidney, Hounsfield density of 1.1. No follow-up imaging is recommended. A chronic 1 cm cyst noted in the anterior segment of the right lobe of the liver.  Also chronic 1.4 cm cyst in the left lobe. Musculoskeletal: No chest wall mass or suspicious bone lesions identified. IMPRESSION: 1. New ground-glass disease in the anterior basal segment of the right lower lobe most likely due to pneumonitis, alternatively could be interval new chronic ground-glass interstitial disease. 2. Cardiomegaly without evidence of CHF. Trace calcification LAD coronary artery. 3. 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 Recommend annual imaging followup by CTA or MRA. This recommendation follows 2010 ACCF/AHA/AATS/ACR/ASA/SCA/SCAI/SIR/STS/SVM Guidelines for the Diagnosis and Management of Patients with Thoracic Aortic Disease. Circulation. 2010; 121: Z610-R604. Aortic aneurysm NOS (ICD10-I71.9) 4. Prominent pulmonary trunk again noted. 5. Emphysema. For lung cancer screening, adhere to Lung-RADS guidelines. 6. Chronic findings described above. Aortic Atherosclerosis (ICD10-I70.0). Electronically Signed   By: Almira Bar M.D.   On: 06/04/2022 22:07   DG Chest Port 1 View  Result Date: 06/04/2022 CLINICAL DATA:  Shortness of breath and cough EXAM: PORTABLE CHEST 1 VIEW COMPARISON:  03/17/2022 FINDINGS: Cardiac shadow remains enlarged. Anterior defibrillator is noted. Vascular congestion is noted centrally. Mild interstitial edema is seen. No focal infiltrate is noted. No effusion is noted. No bony abnormality is seen. IMPRESSION: Changes of CHF with mild edema. Electronically Signed   By: Alcide Clever M.D.   On: 06/04/2022 19:21    Microbiology: Results for orders placed or performed during the hospital encounter of 06/04/22  Blood Culture (routine x 2)     Status: None (Preliminary result)   Collection Time: 06/04/22  6:57 PM   Specimen: BLOOD  Result Value Ref Range Status   Specimen Description BLOOD BLOOD RIGHT FOREARM  Final   Special Requests   Final    BOTTLES DRAWN AEROBIC AND ANAEROBIC Blood Culture adequate volume   Culture    Final    NO GROWTH 3 DAYS Performed at [facility], [address], [place], [place] [address]    Report Status PENDING  Incomplete  Blood Culture (routine x 2)     Status: None (Preliminary result)   Collection Time: 06/04/22  6:59 PM   Specimen: BLOOD  Result Value Ref Range Status   Specimen Description BLOOD BLOOD LEFT WRIST  Final   Special Requests   Final    BOTTLES DRAWN AEROBIC AND ANAEROBIC Blood Culture adequate volume   Culture   Final    NO GROWTH 3 DAYS Performed at  [facility], [address], [place], [place] [address]    Report Status PENDING  Incomplete  SARS Coronavirus 2 by RT PCR (hospital order, performed in [facility] hospital lab) *cepheid single result test* Anterior Nasal Swab     Status: None   Collection Time: 06/04/22  9:38 PM   Specimen: Anterior Nasal Swab  Result Value Ref Range Status   SARS Coronavirus 2 by RT PCR NEGATIVE NEGATIVE Final    Comment: (NOTE) SARS-CoV-2 target nucleic acids are NOT DETECTED.  The SARS-CoV-2 RNA is generally detectable in upper and lower respiratory specimens during the acute phase of infection. The lowest concentration of SARS-CoV-2 viral copies this assay can detect is 250 copies / mL. A negative result does not preclude SARS-CoV-2 infection and should not be used as the sole basis for treatment or other patient management decisions.  A negative result may occur with improper specimen collection / handling, submission of specimen other than nasopharyngeal swab, presence of viral mutation(s) within the areas targeted by this assay, and inadequate number of viral copies (<250 copies / mL). A negative result must be combined with clinical observations, patient history, and epidemiological information.  Fact Sheet for Patients:   RoadLapTop.co.za  Fact Sheet for Healthcare Providers: http://kim-miller.com/  This test is not yet approved or   cleared by the [place] FDA and has been authorized for detection and/or diagnosis of SARS-CoV-2 by FDA under an Emergency Use Authorization (EUA).  This EUA will remain in effect (meaning this test can be used) for the duration of the COVID-19 declaration under Section 564(b)(1) of the Act, 21 U.S.C. section 360bbb-3(b)(1), unless the authorization is terminated or revoked sooner.  Performed at [facility], [address] [place], [place] [address]   Respiratory (~20 pathogens) panel by PCR     Status: Abnormal   Collection Time: 06/04/22  9:38 PM   Specimen: Nasopharyngeal Swab; Respiratory  Result Value Ref Range Status   Adenovirus NOT DETECTED NOT DETECTED Final   Coronavirus 229E NOT DETECTED NOT DETECTED Final    Comment: (NOTE) The Coronavirus on the Respiratory Panel, DOES NOT test for the novel  Coronavirus (2019 nCoV)    Coronavirus HKU1 NOT DETECTED NOT DETECTED Final   Coronavirus NL63 NOT DETECTED NOT DETECTED Final   Coronavirus OC43 NOT DETECTED NOT DETECTED Final   Metapneumovirus NOT DETECTED NOT DETECTED Final   Rhinovirus / Enterovirus DETECTED (A) NOT DETECTED Final   Influenza A NOT DETECTED NOT DETECTED Final   Influenza B NOT DETECTED NOT DETECTED Final   Parainfluenza Virus 1 NOT DETECTED NOT DETECTED Final   Parainfluenza Virus 2 NOT DETECTED NOT DETECTED Final   Parainfluenza Virus 3 NOT DETECTED NOT DETECTED Final   Parainfluenza Virus 4 NOT DETECTED NOT DETECTED Final   Respiratory Syncytial Virus NOT DETECTED NOT DETECTED Final   Bordetella pertussis NOT DETECTED NOT DETECTED Final   Bordetella Parapertussis NOT DETECTED NOT DETECTED Final   Chlamydophila pneumoniae NOT DETECTED NOT DETECTED Final   Mycoplasma pneumoniae NOT DETECTED NOT DETECTED Final    Comment: Performed at [facility], [address], [place], [place] [address]  MRSA Next Gen by PCR, Nasal     Status: None   Collection Time: 06/04/22  9:40 PM    Specimen: Nasal Mucosa; Nasal Swab  Result Value Ref Range Status   MRSA by PCR Next Gen NOT DETECTED NOT DETECTED Final    Comment: (NOTE) The GeneXpert MRSA Assay (FDA approved for  NASAL specimens only), is one component of a comprehensive MRSA colonization surveillance program. It is not intended to diagnose MRSA infection nor to guide or monitor treatment for MRSA infections. Test performance is not FDA approved in patients less than 56 years old. Performed at [facility], [address] [place], [place] [address]     Labs: CBC: Recent Labs  Lab 06/04/22 1856 06/05/22 0514 06/07/22 0427  WBC 9.9 8.8 10.1  NEUTROABS 8.2*  --   --   HGB 16.3 14.8 16.3  HCT 51.0 45.5 50.2  MCV 92.4 90.5 89.8  PLT 153 124* 126*   Basic Metabolic Panel: Recent Labs  Lab 06/04/22 1856 06/05/22 0514 06/06/22 0436 06/07/22 0427  NA 140 140 138 138  K 4.3 4.1 5.0 4.2  CL 103 103 101 101  CO2 GLUCOSE 124* 123* 137* 105*  BUN 33* 33* 50* 49*  CREATININE 1.84* 1.77* 1.93* 1.56*  CALCIUM 9.0 8.6* 8.5* 8.4*  MG  --  2.2 2.5*  --   PHOS  --  4.2 3.0  --    Liver Function Tests: No results for input(s): "AST", "ALT", "ALKPHOS", "BILITOT", "PROT", "ALBUMIN" in the last 168 hours. CBG: Recent Labs  Lab 06/06/22 0744 06/06/22 1132 06/06/22 1738 06/06/22 2116 06/07/22 0819  GLUCAP 118* 126* 139* 123* 156*    Discharge time spent: less than 30 minutes.  Signed: Pennie Banter, DO [facility] 06/07/2022

## 2022-06-07 NOTE — Discharge Instructions (Signed)
Some PCP options in [place] area- not a comprehensive list  [facility]- [phone] [facility]- [phone] [facility]- [phone] [facility]- [phone] [facility]- [phone] [facility]- [phone]  or [place] Physician Referral Line [phone]  

## 2022-06-07 NOTE — Progress Notes (Signed)
DISCHARGE EDUCATION COMPLETED. IV'S REMOVED WITHOUT COMPLICATIONS. TELEPACK RETURNED TO STATION.   Jonathon Rios

## 2022-06-07 NOTE — Care Management Important Message (Signed)
Important Message  Patient Details  Name: Jonathon Rios MRN: [id number] Date of Birth: 1950/11/19   Medicare Important Message Given:  Yes  Reviewed Medicare IM with patient via room phone [phone]).  Declined copy of Medicare IM at this time.   [name] 06/07/2022, 10:29 AM

## 2022-06-07 NOTE — Plan of Care (Signed)
Patient is stable for discharge.  Jonathon Rios Jonathon Rios

## 2022-06-07 NOTE — Consult Note (Signed)
   [facility] CM Inpatient Consult   06/07/2022  Jonathon Rios 1950-12-24 [id number]     Location: [facility] RN Hospital Liaison screen remotely due to [facility] green banner [facility]).   [facility] Customer service manager [facility]) Accountable Care Organization [ACO] Patient: [facility])   Primary Care Provider:  Verified pt with  [facility] Non-networking provider  Patient screened for readmission hospitalization with noted high risk score for unplanned readmission risk with 1 IP in 6 months. Pt verified his new provider's office now with [facility] who is NOT IS NETWORK FOR [facility] SERVICES.     [facility] Care Management/Population Health does not replace or interfere with any arrangements made by the Inpatient Transition of Care team.   For questions contact:   Elliot Cousin, RN, BSN [facility] Liaison [place]   [facility]  Population Health Office Hours MTWF 7:30 am to 6 pm [phone] mobile [phone] [Office toll free line][facility] Office Hours are M-F 8:30 - 5 pm 24 hour nurse advise line [phone] Conceirge  [email or web address]

## 2022-06-07 NOTE — TOC Initial Note (Signed)
Transition of Care [facility]) - Initial/Assessment Note    Patient Details  Name: Jonathon Rios MRN: [id number] Date of Birth: 08/18/1950  Transition of Care [facility]) CM/SW Contact:    Chapman Fitch, RN Phone Number: 06/07/2022, 10:18 AM  Clinical Narrative:                  Patient to dc today Per MD no toc needs for discharge No PCP listed List of local PCP added to AVS.  Notified patient he would need to reached out to his insurance to determine who is in network Patient did not qualify for home O2,  patient declined bipap.  MD aware        Patient Goals and CMS Choice            Expected Discharge Plan and Services         Expected Discharge Date: 06/07/22                                    Prior Living Arrangements/Services                       Activities of Daily Living Home Assistive Devices/Equipment: Eyeglasses ADL Screening (condition at time of admission) Patient's cognitive ability adequate to safely complete daily activities?: Yes Is the patient deaf or have difficulty hearing?: No Does the patient have difficulty seeing, even when wearing glasses/contacts?: Yes Does the patient have difficulty concentrating, remembering, or making decisions?: No Patient able to express need for assistance with ADLs?: Yes Does the patient have difficulty dressing or bathing?: No Independently performs ADLs?: Yes (appropriate for developmental age) Does the patient have difficulty walking or climbing stairs?: No Weakness of Legs: None Weakness of Arms/Hands: None  Permission Sought/Granted                  Emotional Assessment              Admission diagnosis:  Acute pulmonary edema [J81.0] Productive cough [R05.8] Acute respiratory failure with hypercapnia [J96.02] COPD with acute exacerbation [J44.1] Fever, unspecified fever cause [R50.9] Acute on chronic respiratory failure with hypoxia and hypercapnia [J96.21, J96.22] Patient  Active Problem List   Diagnosis Date Noted   Rhinovirus infection 06/06/2022   Acute on chronic respiratory failure with hypoxia and hypercapnia 06/04/2022   COPD exacerbation 03/18/2022   COPD with acute exacerbation 03/17/2022   Hematemesis 03/17/2022   Elevated LFTs 03/17/2022   Demand ischemia 03/17/2022   Positive colorectal cancer screening using Cologuard test    Bronchitis 04/30/2021   Urinary hesitancy 04/06/2021   Influenza A 02/09/2021   Adjustment reaction with anxiety and depression 11/25/2020   Acute exacerbation of CHF (congestive heart failure) 11/20/2020   Chronic HFrEF (heart failure with reduced ejection fraction) 10/14/2020   NICM (nonischemic cardiomyopathy) 10/14/2020   COPD (chronic obstructive pulmonary disease) 10/14/2020   Malnutrition of moderate degree 10/09/2020   Acute on chronic combined systolic and diastolic CHF (congestive heart failure) 10/09/2020   Seizures 10/09/2020   Endotracheally intubated 10/09/2020   Acute respiratory failure with hypoxia 10/09/2020   Acute kidney injury superimposed on CKD 10/09/2020   Hypoglycemia 10/09/2020   Cardiac arrest 10/08/2020   Unstable angina    Atherosclerosis of aorta 07/23/2020   HLD (hyperlipidemia) 04/10/2020   Chronic kidney disease, stage 3b 04/10/2020   Tinea corporis 05/15/2019   Chronic hepatitis C 11/07/2018  History of drug abuse (HCC) 10/17/2017   HTN (hypertension) 03/16/2017   Tobacco use 03/16/2017   Moderate recurrent major depression 03/09/2017   PTSD (post-traumatic stress disorder) 03/09/2017   Cocaine abuse 03/09/2017   History of cocaine abuse 03/09/2017   CHF (congestive heart failure) 03/07/2017   Acute CHF (congestive heart failure) 01/02/2017   PCP:  Pcp, No Pharmacy:   [facility] [address], [place] - [address] [place] [address] Phone: [phone] Fax: [phone]  [facility] [address]), [place] - [address] [place]) [place] [address] Phone: [phone] Fax: [phone]     Social Determinants of Health (SDOH) Social History: SDOH Screenings   Food Insecurity: No Food Insecurity (06/04/2022)  Housing: Low Risk  (06/04/2022)  Transportation Needs: No Transportation Needs (06/04/2022)  Utilities: Not At Risk (06/04/2022)  Depression (PHQ2-9): Low Risk  (08/11/2021)  Financial Resource Strain: High Risk (04/13/2017)  Physical Activity: Unknown (07/06/2021)  Social Connections: Socially Isolated (07/06/2021)  Stress: No Stress Concern Present (07/06/2021)  Tobacco Use: Medium Risk (06/04/2022)   SDOH Interventions:     Readmission Risk Interventions     No data to display

## 2022-06-07 NOTE — Progress Notes (Signed)
Home oxygen not warranted at this time.  Patient Saturations on Room Air at Rest = 96%  Patient Saturations on [employer] while Ambulating = 90%  Kem Boroughs

## 2022-06-07 NOTE — Progress Notes (Signed)
PATIENT BEING TRANSPORTED TO THE MEDICAL MALL EXIT BY STAFF TO BE DISCHARGED TO THE CARE OF HIS FAMILY, IN STABLE CONDITION.  Jonathon Rios Jonathon Rios

## 2022-06-07 NOTE — Progress Notes (Signed)
Patient took bipap mask off after 3 hours on. Patient stated "couldn't get any sleep with the machine on". Patient is in no distress and on room air sating 97%.

## 2022-06-07 NOTE — Progress Notes (Signed)
Mobility Specialist - Progress Note   06/07/22 0929  Mobility  Activity Ambulated independently to bathroom;Stood at bedside;Dangled on edge of bed  Level of Assistance Independent  Assistive Device None  Distance Ambulated (ft) 10 ft  Range of Motion/Exercises Active  Activity Response Tolerated well  Mobility Referral Yes  $Mobility charge 1 Mobility   Pt resting EOB upon entry. Pt STS and ambulates to bathroom indep with no AD. Pt returned to bed and left with needs in reach and bed alarm activated.   Johnathan Hausen Mobility Specialist 06/07/22, 9:32 AM

## 2022-06-08 LAB — CULTURE, BLOOD (ROUTINE X 2)

## 2022-06-09 LAB — CULTURE, BLOOD (ROUTINE X 2)
Culture: NO GROWTH
Culture: NO GROWTH
Special Requests: ADEQUATE
Special Requests: ADEQUATE

## 2022-06-22 ENCOUNTER — Ambulatory Visit: Payer: 59 | Admitting: Dermatology

## 2022-06-28 ENCOUNTER — Institutional Professional Consult (permissible substitution): Payer: 59 | Admitting: Pulmonary Disease

## 2022-06-28 ENCOUNTER — Encounter: Payer: Self-pay | Admitting: Pulmonary Disease
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
## 2022-07-07 ENCOUNTER — Other Ambulatory Visit: Payer: Self-pay | Admitting: Family

## 2022-07-07 DIAGNOSIS — I428 Other cardiomyopathies: Secondary | ICD-10-CM

## 2022-07-07 NOTE — Telephone Encounter (Signed)
Pt stated that he does not want this medication refilled and refused to scheduled an appointment with Foster Simpson at this time. Pt stated he will call our clinic when he needs refills and need to be seen in the future.

## 2022-07-26 ENCOUNTER — Ambulatory Visit (INDEPENDENT_AMBULATORY_CARE_PROVIDER_SITE_OTHER): Payer: 59

## 2022-07-26 DIAGNOSIS — I428 Other cardiomyopathies: Secondary | ICD-10-CM

## 2022-07-28 LAB — CUP PACEART REMOTE DEVICE CHECK
Battery Remaining Percentage: 80 %
Date Time Interrogation Session: 20240528070300
Implantable Lead Connection Status: 753985
Implantable Lead Implant Date: 20220818
Implantable Lead Location: 753859
Implantable Lead Model: 3501
Implantable Lead Serial Number: 218222
Implantable Pulse Generator Implant Date: 20220818
Pulse Gen Serial Number: 166520

## 2022-07-30 ENCOUNTER — Other Ambulatory Visit: Payer: Self-pay | Admitting: Cardiovascular Disease

## 2022-08-01 NOTE — Telephone Encounter (Signed)
Yes, okay to refill 

## 2022-08-01 NOTE — Telephone Encounter (Signed)
Please advise if ok to refill new dose last filled by  Date: 06/07/2022 Department: [facility] GENERAL SURGERY Ordering/Authorizing: Pennie Banter, DO

## 2022-08-12 ENCOUNTER — Other Ambulatory Visit: Payer: 59

## 2022-08-19 NOTE — Progress Notes (Signed)
Remote ICD transmission.   

## 2022-08-25 ENCOUNTER — Other Ambulatory Visit: Payer: 59

## 2022-08-26 ENCOUNTER — Ambulatory Visit (LOCAL_COMMUNITY_HEALTH_CENTER): Payer: Self-pay

## 2022-08-26 DIAGNOSIS — Z719 Counseling, unspecified: Secondary | ICD-10-CM

## 2022-08-26 NOTE — Progress Notes (Signed)
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 Patient reported that he believes he contracted TB while in [place] [facility] in 2007 and took medication for 6 months. Unsure of what the medication was. RN contacted the [facility] Medical Records Manager and requested the TB related records be faxed. [facility] agreed to fax; awaiting files.   Patient scheduled for another appointment on Monday, July 1st as RN unsure of how long it will take for the files to come through.   Patient given the opportunity to ask questions. All questions answered and patient verbalizes understanding.   Abagail Kitchens, RN

## 2022-08-29 ENCOUNTER — Other Ambulatory Visit: Payer: Self-pay

## 2022-08-29 ENCOUNTER — Ambulatory Visit (LOCAL_COMMUNITY_HEALTH_CENTER): Payer: Self-pay

## 2022-08-29 ENCOUNTER — Other Ambulatory Visit: Payer: Self-pay | Admitting: Surgery

## 2022-08-29 DIAGNOSIS — Z111 Encounter for screening for respiratory tuberculosis: Secondary | ICD-10-CM

## 2022-08-29 DIAGNOSIS — R7611 Nonspecific reaction to tuberculin skin test without active tuberculosis: Secondary | ICD-10-CM

## 2022-08-29 NOTE — Progress Notes (Signed)
Orders not necessary, encounter can be deleted.  Jennye Moccasin, MD

## 2022-08-29 NOTE — Progress Notes (Signed)
Pt returned to nurse clinic today for completion of TB screening form.  See nurse note from 08/26/22.  Consulted with Dr. Wyvonnia Lora who reviewed record received from [facility]. 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   Per epic, CXR done 06/04/22.  Pt reports he has COPD and bronchitis.   Record of Tb Screening form given to pt; copy sent for scanning.    Pt informed he should never receive a TB skin test since hx positive test and he can obtain Tb screening form when needed.  Cherlynn Polo, RN

## 2022-08-30 NOTE — Progress Notes (Signed)
MD Attestation for TB RN: I agree with the care provided to this patient and the plan for follow up and treatment.  Kylii Ennis M. Jaclyne Haverstick, MD  

## 2022-09-01 ENCOUNTER — Other Ambulatory Visit: Payer: Self-pay | Admitting: Family

## 2022-09-09 NOTE — Telephone Encounter (Signed)
Spoke to pt and he stated that he goes to [facility] now!

## 2022-09-13 NOTE — Telephone Encounter (Signed)
No longer pt here

## 2022-10-08 ENCOUNTER — Other Ambulatory Visit: Payer: Self-pay | Admitting: Family

## 2022-10-08 ENCOUNTER — Other Ambulatory Visit: Payer: Self-pay | Admitting: Cardiovascular Disease

## 2022-10-10 NOTE — Telephone Encounter (Signed)
Hi,  Could you schedule this patient a 6 month follow up visit? The patient was last seen by Dr. Kirke Corin on 05-05-22. Thank you so much.

## 2022-10-21 NOTE — Telephone Encounter (Signed)
Left voicemail to schedule 6 month follow up appointment

## 2022-10-24 ENCOUNTER — Other Ambulatory Visit: Payer: Self-pay | Admitting: Family

## 2022-10-26 ENCOUNTER — Other Ambulatory Visit: Payer: Self-pay | Admitting: Family

## 2022-10-26 DIAGNOSIS — I428 Other cardiomyopathies: Secondary | ICD-10-CM

## 2022-10-26 NOTE — Telephone Encounter (Signed)
Pt stated that he no longer takes Lasix. So does not want a f/u appt and a lasix refill.

## 2023-02-04 ENCOUNTER — Other Ambulatory Visit: Payer: Self-pay | Admitting: Urology

## 2023-02-04 DIAGNOSIS — N401 Enlarged prostate with lower urinary tract symptoms: Secondary | ICD-10-CM

## 2023-06-21 ENCOUNTER — Ambulatory Visit: Admitting: Cardiology
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
## 2023-06-26 ENCOUNTER — Other Ambulatory Visit: Payer: Self-pay | Admitting: Family Medicine

## 2023-06-26 DIAGNOSIS — Z122 Encounter for screening for malignant neoplasm of respiratory organs: Secondary | ICD-10-CM

## 2023-07-27 ENCOUNTER — Telehealth: Payer: Self-pay

## 2023-07-27 NOTE — Telephone Encounter (Signed)
 The pt called to let us  know that he is moving but it looks like his device is being followed by the [facility]. I gave him their number.

## 2023-07-31 ENCOUNTER — Inpatient Hospital Stay
Admission: RE | Admit: 2023-07-31 | Discharge: 2023-08-03 | DRG: 291 | Disposition: A | Discharge status: Home / Self Care | Attending: Obstetrics and Gynecology | Admitting: Obstetrics and Gynecology

## 2023-07-31 ENCOUNTER — Other Ambulatory Visit: Payer: Self-pay

## 2023-07-31 ENCOUNTER — Encounter: Payer: Self-pay | Admitting: Internal Medicine

## 2023-07-31 ENCOUNTER — Emergency Department

## 2023-07-31 DIAGNOSIS — Z1152 Encounter for screening for COVID-19: Secondary | ICD-10-CM

## 2023-07-31 DIAGNOSIS — Z8674 Personal history of sudden cardiac arrest: Secondary | ICD-10-CM

## 2023-07-31 DIAGNOSIS — I130 Hypertensive heart and chronic kidney disease with heart failure and stage 1 through stage 4 chronic kidney disease, or unspecified chronic kidney disease: Secondary | ICD-10-CM | POA: Diagnosis not present

## 2023-07-31 DIAGNOSIS — E785 Hyperlipidemia, unspecified: Secondary | ICD-10-CM | POA: Diagnosis present

## 2023-07-31 DIAGNOSIS — T501X5A Adverse effect of loop [high-ceiling] diuretics, initial encounter: Secondary | ICD-10-CM | POA: Diagnosis not present

## 2023-07-31 DIAGNOSIS — F17210 Nicotine dependence, cigarettes, uncomplicated: Secondary | ICD-10-CM | POA: Diagnosis present

## 2023-07-31 DIAGNOSIS — J42 Unspecified chronic bronchitis: Secondary | ICD-10-CM | POA: Diagnosis not present

## 2023-07-31 DIAGNOSIS — Z6372 Alcoholism and drug addiction in family: Secondary | ICD-10-CM

## 2023-07-31 DIAGNOSIS — J449 Chronic obstructive pulmonary disease, unspecified: Secondary | ICD-10-CM | POA: Diagnosis present

## 2023-07-31 DIAGNOSIS — J211 Acute bronchiolitis due to human metapneumovirus: Secondary | ICD-10-CM | POA: Diagnosis present

## 2023-07-31 DIAGNOSIS — Z8249 Family history of ischemic heart disease and other diseases of the circulatory system: Secondary | ICD-10-CM

## 2023-07-31 DIAGNOSIS — Z813 Family history of other psychoactive substance abuse and dependence: Secondary | ICD-10-CM

## 2023-07-31 DIAGNOSIS — Z95810 Presence of automatic (implantable) cardiac defibrillator: Secondary | ICD-10-CM

## 2023-07-31 DIAGNOSIS — J440 Chronic obstructive pulmonary disease with acute lower respiratory infection: Secondary | ICD-10-CM | POA: Diagnosis present

## 2023-07-31 DIAGNOSIS — R0602 Shortness of breath: Secondary | ICD-10-CM | POA: Diagnosis not present

## 2023-07-31 DIAGNOSIS — Z880 Allergy status to penicillin: Secondary | ICD-10-CM

## 2023-07-31 DIAGNOSIS — Z8349 Family history of other endocrine, nutritional and metabolic diseases: Secondary | ICD-10-CM

## 2023-07-31 DIAGNOSIS — F1911 Other psychoactive substance abuse, in remission: Secondary | ICD-10-CM

## 2023-07-31 DIAGNOSIS — N1832 Chronic kidney disease, stage 3b: Secondary | ICD-10-CM

## 2023-07-31 DIAGNOSIS — I509 Heart failure, unspecified: Secondary | ICD-10-CM

## 2023-07-31 DIAGNOSIS — Z79899 Other long term (current) drug therapy: Secondary | ICD-10-CM

## 2023-07-31 DIAGNOSIS — J441 Chronic obstructive pulmonary disease with (acute) exacerbation: Principal | ICD-10-CM | POA: Diagnosis present

## 2023-07-31 DIAGNOSIS — Z833 Family history of diabetes mellitus: Secondary | ICD-10-CM

## 2023-07-31 DIAGNOSIS — D696 Thrombocytopenia, unspecified: Secondary | ICD-10-CM

## 2023-07-31 DIAGNOSIS — Z7982 Long term (current) use of aspirin: Secondary | ICD-10-CM

## 2023-07-31 DIAGNOSIS — I10 Essential (primary) hypertension: Secondary | ICD-10-CM

## 2023-07-31 DIAGNOSIS — Z811 Family history of alcohol abuse and dependence: Secondary | ICD-10-CM

## 2023-07-31 DIAGNOSIS — I5023 Acute on chronic systolic (congestive) heart failure: Secondary | ICD-10-CM

## 2023-07-31 DIAGNOSIS — I428 Other cardiomyopathies: Secondary | ICD-10-CM | POA: Diagnosis present

## 2023-07-31 DIAGNOSIS — M48061 Spinal stenosis, lumbar region without neurogenic claudication: Secondary | ICD-10-CM | POA: Diagnosis present

## 2023-07-31 DIAGNOSIS — Z818 Family history of other mental and behavioral disorders: Secondary | ICD-10-CM

## 2023-07-31 DIAGNOSIS — Z825 Family history of asthma and other chronic lower respiratory diseases: Secondary | ICD-10-CM

## 2023-07-31 DIAGNOSIS — I2510 Atherosclerotic heart disease of native coronary artery without angina pectoris: Secondary | ICD-10-CM | POA: Diagnosis present

## 2023-07-31 LAB — COMPREHENSIVE METABOLIC PANEL WITH GFR
ALT: 11 U/L (ref 0–44)
AST: 21 U/L (ref 15–41)
Albumin: 3.6 g/dL (ref 3.5–5.0)
Alkaline Phosphatase: 46 U/L (ref 38–126)
Anion gap: 9 (ref 5–15)
BUN: 25 mg/dL — ABNORMAL HIGH (ref 8–23)
CO2: 26 mmol/L (ref 22–32)
Calcium: 8.4 mg/dL — ABNORMAL LOW (ref 8.9–10.3)
Chloride: 103 mmol/L (ref 98–111)
Creatinine, Ser: 1.49 mg/dL — ABNORMAL HIGH (ref 0.61–1.24)
GFR, Estimated: 50 mL/min — ABNORMAL LOW (ref >60)
Glucose, Bld: 90 mg/dL (ref 70–99)
Potassium: 4.4 mmol/L (ref 3.5–5.1)
Sodium: 138 mmol/L (ref 135–145)
Total Bilirubin: 1.2 mg/dL (ref 0.0–1.2)
Total Protein: 7.2 g/dL (ref 6.5–8.1)

## 2023-07-31 LAB — RESP PANEL BY RT-PCR (RSV, FLU A&B, COVID)  RVPGX2
Influenza A by PCR: NEGATIVE
Influenza B by PCR: NEGATIVE
Resp Syncytial Virus by PCR: NEGATIVE
SARS Coronavirus 2 by RT PCR: NEGATIVE

## 2023-07-31 LAB — CBC
HCT: 44.9 % (ref 39.0–52.0)
Hemoglobin: 14.7 g/dL (ref 13.0–17.0)
MCH: 30.4 pg (ref 26.0–34.0)
MCHC: 32.7 g/dL (ref 30.0–36.0)
MCV: 92.8 fL (ref 80.0–100.0)
Platelets: 83 10*3/uL — ABNORMAL LOW (ref 150–400)
RBC: 4.84 MIL/uL (ref 4.22–5.81)
RDW: 14 % (ref 11.5–15.5)
WBC: 4.3 10*3/uL (ref 4.0–10.5)
nRBC: 0 % (ref 0.0–0.2)

## 2023-07-31 LAB — D-DIMER, QUANTITATIVE: D-Dimer, Quant: 0.27 ug{FEU}/mL (ref 0.00–0.50)

## 2023-07-31 LAB — BRAIN NATRIURETIC PEPTIDE: B Natriuretic Peptide: 3086.9 pg/mL — ABNORMAL HIGH (ref 0.0–100.0)

## 2023-07-31 LAB — TROPONIN I (HIGH SENSITIVITY): Troponin I (High Sensitivity): 38 ng/L — ABNORMAL HIGH (ref <18)

## 2023-07-31 MED ORDER — DAPAGLIFLOZIN PROPANEDIOL 10 MG PO TABS
10.0000 mg | ORAL_TABLET | Freq: Every day | ORAL | Status: DC
  Administered 2023-08-01 – 2023-08-03 (×3): 10 mg via ORAL
  Filled 2023-07-31 (×4): qty 1

## 2023-07-31 MED ORDER — FUROSEMIDE 10 MG/ML IJ SOLN
60.0000 mg | Freq: Once | INTRAMUSCULAR | Status: AC
  Administered 2023-07-31: 60 mg via INTRAVENOUS
  Filled 2023-07-31: qty 8

## 2023-07-31 MED ORDER — ASPIRIN 81 MG PO TBEC
81.0000 mg | DELAYED_RELEASE_TABLET | Freq: Every day | ORAL | Status: DC
  Administered 2023-08-01 – 2023-08-03 (×3): 81 mg via ORAL
  Filled 2023-07-31 (×3): qty 1

## 2023-07-31 MED ORDER — SACUBITRIL-VALSARTAN 97-103 MG PO TABS
1.0000 | ORAL_TABLET | Freq: Two times a day (BID) | ORAL | Status: DC
  Administered 2023-07-31 – 2023-08-03 (×6): 1 via ORAL
  Filled 2023-07-31 (×6): qty 1

## 2023-07-31 MED ORDER — ENOXAPARIN SODIUM 40 MG/0.4ML IJ SOSY
40.0000 mg | PREFILLED_SYRINGE | INTRAMUSCULAR | Status: DC
  Administered 2023-07-31 – 2023-08-02 (×3): 40 mg via SUBCUTANEOUS
  Filled 2023-07-31 (×3): qty 0.4

## 2023-07-31 MED ORDER — SODIUM CHLORIDE 0.9 % IV SOLN: 250.0000 mL | INTRAVENOUS | Status: AC | PRN

## 2023-07-31 MED ORDER — CARVEDILOL 12.5 MG PO TABS
12.5000 mg | ORAL_TABLET | Freq: Two times a day (BID) | ORAL | Status: DC
  Administered 2023-07-31 – 2023-08-01 (×2): 12.5 mg via ORAL
  Filled 2023-07-31 (×2): qty 1

## 2023-07-31 MED ORDER — IPRATROPIUM-ALBUTEROL 0.5-2.5 (3) MG/3ML IN SOLN
3.0000 mL | Freq: Once | RESPIRATORY_TRACT | Status: AC
  Administered 2023-07-31: 3 mL via RESPIRATORY_TRACT
  Filled 2023-07-31: qty 3

## 2023-07-31 MED ORDER — IPRATROPIUM-ALBUTEROL 0.5-2.5 (3) MG/3ML IN SOLN
3.0000 mL | RESPIRATORY_TRACT | Status: DC | PRN
  Administered 2023-08-01 – 2023-08-02 (×5): 3 mL via RESPIRATORY_TRACT
  Filled 2023-07-31 (×5): qty 3

## 2023-07-31 MED ORDER — BUDESON-GLYCOPYRROL-FORMOTEROL 160-9-4.8 MCG/ACT IN AERO
2.0000 | INHALATION_SPRAY | Freq: Two times a day (BID) | RESPIRATORY_TRACT | Status: DC
  Administered 2023-07-31 – 2023-08-03 (×6): 2 via RESPIRATORY_TRACT
  Filled 2023-07-31 (×2): qty 5.9

## 2023-07-31 MED ORDER — SODIUM CHLORIDE 0.9% FLUSH: 3.0000 mL | INTRAVENOUS | Status: DC | PRN

## 2023-07-31 MED ORDER — SODIUM CHLORIDE 0.9% FLUSH
3.0000 mL | Freq: Two times a day (BID) | INTRAVENOUS | Status: DC
  Administered 2023-07-31 – 2023-08-03 (×6): 3 mL via INTRAVENOUS

## 2023-07-31 MED ORDER — ROSUVASTATIN CALCIUM 10 MG PO TABS
10.0000 mg | ORAL_TABLET | Freq: Every day | ORAL | Status: DC
  Administered 2023-08-01 – 2023-08-03 (×3): 10 mg via ORAL
  Filled 2023-07-31 (×3): qty 1

## 2023-07-31 MED ORDER — ONDANSETRON HCL 4 MG/2ML IJ SOLN
4.0000 mg | Freq: Four times a day (QID) | INTRAMUSCULAR | Status: DC | PRN
  Administered 2023-08-01: 4 mg via INTRAVENOUS
  Filled 2023-07-31: qty 2

## 2023-07-31 MED ORDER — ACETAMINOPHEN 325 MG PO TABS: 650.0000 mg | ORAL_TABLET | ORAL | Status: DC | PRN

## 2023-07-31 MED ORDER — TAMSULOSIN HCL 0.4 MG PO CAPS
0.4000 mg | ORAL_CAPSULE | Freq: Every day | ORAL | Status: DC
  Administered 2023-07-31 – 2023-08-03 (×4): 0.4 mg via ORAL
  Filled 2023-07-31 (×4): qty 1

## 2023-07-31 MED ORDER — FUROSEMIDE 10 MG/ML IJ SOLN
60.0000 mg | Freq: Every day | INTRAMUSCULAR | Status: DC
  Administered 2023-08-01 – 2023-08-02 (×2): 60 mg via INTRAVENOUS
  Filled 2023-07-31 (×2): qty 6

## 2023-07-31 MED ORDER — NITROGLYCERIN 2 % TD OINT
1.0000 [in_us] | TOPICAL_OINTMENT | TRANSDERMAL | Status: AC
  Administered 2023-07-31: 1 [in_us] via TOPICAL
  Filled 2023-07-31: qty 1

## 2023-07-31 MED ORDER — NITROGLYCERIN 0.4 MG SL SUBL
0.4000 mg | SUBLINGUAL_TABLET | SUBLINGUAL | Status: AC
  Administered 2023-07-31: 0.4 mg via SUBLINGUAL
  Filled 2023-07-31: qty 1

## 2023-07-31 NOTE — Assessment & Plan Note (Signed)
 Initial concern for COPD exacerbation, however history and chest x-ray more consistent with CHF exacerbation.  - DuoNebs as needed - Continue home bronchodilators

## 2023-07-31 NOTE — Assessment & Plan Note (Signed)
 History of CKD stage IIIb, with creatinine between 1.6 and 1.7, currently 1.5.  - Continue to monitor renal function while diuresing

## 2023-07-31 NOTE — ED Provider Notes (Signed)
[facility] Provider Note    Event Date/Time   First MD Initiated Contact with Patient 07/31/23 1022     (approximate)   History   Shortness of Breath   HPI  Jonathon Rios is a 73 y.o. male reports increasing shortness of breath for the last few days.  Using inhalers at home without relief.  Patient also has a history of heart failure, chronic kidney disease COPD cardiomyopathy hepatitis C and previous V-fib cardiac arrest and smoker  Patient also had some swelling in both of his feet a few days ago that seems of gone away.  He also reports an achiness in his back but tells me that he suffers from lumbar stenosis chronically  He has not had any fever or chills but has been coughing and noticed that he is "wheezing" quite a lot       Physical Exam   Triage Vital Signs: ED Triage Vitals  Encounter Vitals Group     BP 07/31/23 1013 (!) 184/122     Systolic BP Percentile --      Diastolic BP Percentile --      Pulse Rate 07/31/23 1013 93     Resp 07/31/23 1013 (!) 24     Temp 07/31/23 1013 98.4 F (36.9 C)     Temp Source 07/31/23 1013 Oral     SpO2 --      Weight 07/31/23 1012 169 lb (76.7 kg)     Height 07/31/23 1012 6\' 3"  (1.905 m)     Head Circumference --      Peak Flow --      Pain Score 07/31/23 1011 0     Pain Loc --      Pain Education --      Exclude from Growth Chart --     Most recent vital signs: Vitals:   07/31/23 1130 07/31/23 1200  BP: (!) 169/111 (!) 166/107  Pulse: 93 95  Resp: (!) 40 (!) 42  Temp:    SpO2: 100% 96%     General: Awake, no distress though just slightly tachypneic CV:  Good peripheral perfusion.  Normal tones and rate Resp:  Normal effort with exception to slight tachypnea.  Moderate expiratory wheezing throughout all lung fields Abd:  No distention.  Other:  No appreciable edema in lower extremities bilateral   ED Results / Procedures / Treatments   Labs (all labs ordered are listed, but  only abnormal results are displayed) Labs Reviewed  CBC - Abnormal; Notable for the following components:      Result Value   Platelets 83 (*)    All other components within normal limits  BRAIN NATRIURETIC PEPTIDE - Abnormal; Notable for the following components:   B Natriuretic Peptide 3,086.9 (*)    All other components within normal limits  COMPREHENSIVE METABOLIC PANEL WITH GFR - Abnormal; Notable for the following components:   BUN 25 (*)    Creatinine, Ser 1.49 (*)    Calcium  8.4 (*)    GFR, Estimated 50 (*)    All other components within normal limits  TROPONIN I (HIGH SENSITIVITY) - Abnormal; Notable for the following components:   Troponin I (High Sensitivity) 38 (*)    All other components within normal limits  RESP PANEL BY RT-PCR (RSV, FLU A&B, COVID)  RVPGX2  D-DIMER, QUANTITATIVE (NOT AT [facility])     EKG  Interpreted by me at 1020 heart rate 90 QRS 110 QTc 500 Normal sinus rhythm,  probable left ventricular hypertrophy with repolarization abnormality also noted is ST segment depression in V5 and V6 as well as inverted T wave in lead II, III and aVF.  Compared with previous EKG from April of this year T wave inversions now present in lead II and aVF   RADIOLOGY  Chest x-ray interpreted as vascular congestion  DG Chest 2 View Result Date: 07/31/2023 CLINICAL DATA:  Shortness of breath, chest pain EXAM: CHEST - 2 VIEW COMPARISON:  06/04/2022 FINDINGS: Mild cardiomegaly. Mediastinal contours within normal limits. Anterior defibrillator again noted, unchanged. Mild vascular congestion and perihilar interstitial prominence which may reflect early interstitial edema. No effusions or acute bony abnormality. IMPRESSION: Cardiomegaly with vascular congestion. Perihilar interstitial prominence may reflect early interstitial edema. Electronically Signed   By: Janeece Mechanic M.D.   On: 07/31/2023 12:03      PROCEDURES:  Critical Care performed: No  Procedures   MEDICATIONS  ORDERED IN ED: Medications  ipratropium-albuterol  (DUONEB) 0.5-2.5 (3) MG/3ML nebulizer solution 3 mL (3 mLs Nebulization Given 07/31/23 1053)  ipratropium-albuterol  (DUONEB) 0.5-2.5 (3) MG/3ML nebulizer solution 3 mL (3 mLs Nebulization Given 07/31/23 1052)  nitroGLYCERIN  (NITROSTAT ) SL tablet 0.4 mg (0.4 mg Sublingual Given 07/31/23 1131)  furosemide  (LASIX ) injection 60 mg (60 mg Intravenous Given 07/31/23 1207)  nitroGLYCERIN  (NITROGLYN) 2 % ointment 1 inch (1 inch Topical Given 07/31/23 1207)     IMPRESSION / MDM / ASSESSMENT AND PLAN / ED COURSE  I reviewed the triage vital signs and the nursing notes.                              Differential diagnosis includes, but is not limited to, COPD or CHF, reactive airway disease viral syndrome pneumonia etc.  No chest pain no clinical exam findings to be highly suggestive of thromboembolism.  Patient does have a known history of cardiac dysrhythmia, implant, etc  Patient's presentation is most consistent with acute complicated illness / injury requiring diagnostic workup.  Deemed low risk for PE, D-dimer excusionary with low pretest probability for PE  The patient is on the cardiac monitor to evaluate for evidence of arrhythmia and/or significant heart rate changes.   Clinical Course as of 07/31/23 1229  Mon Jul 31, 2023  1158 Patient reports feeling somewhat better after nitroglycerin .  BNP is markedly elevated, at this point suspect acute CHF on potentially combination with COPD.  He is currently resting on room air, but remains tachypneic.  Will give Lasix , nitroglycerin  paste, and anticipate admission to the hospital.  Patient agreeable. [MQ]    Clinical Course User Index [MQ] Iver Marker, MD   ----------------------------------------- 12:28 PM on 07/31/2023 ----------------------------------------- Patient respirations improving patient reports improvement in symptomatology.  He appears to have significant CHF findings by laboratory  evaluation of BNP as well as chest x-ray.  Suspect this may be multifactorial with potential CHF or associated COPD.  Patient showing improvement in symptoms at this time.  Consulted with our hospitalist Dr. Guss Legacy who is accepting of admission request  FINAL CLINICAL IMPRESSION(S) / ED DIAGNOSES   Final diagnoses:  COPD exacerbation (HCC)  Acute on chronic congestive heart failure, unspecified heart failure type (HCC)     Rx / DC Orders   ED Discharge Orders     None        Note:  This document was prepared using Dragon voice recognition software and may include unintentional dictation errors.   Iver Marker, MD 07/31/23  1529  

## 2023-07-31 NOTE — Assessment & Plan Note (Signed)
 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

## 2023-07-31 NOTE — H&P (Signed)
 History and Physical    Patient: Jonathon Rios UUV:[id number] DOB: Sep 14, 1950 DOA: 07/31/2023 DOS: the patient was seen and examined on 07/31/2023 PCP: Tawnya Fava, Waddell Guess, MD  Patient coming from: Home  Chief Complaint:  Chief Complaint  Patient presents with   Shortness of Breath   HPI: Jonathon Rios is a 73 y.o. male with medical history significant of HFrEF with last EF of 25-30% (April 2024), V-fib arrest s/p AICD (2022), nonobstructive CAD, polysubstance abuse, COPD, CKD stage IIIb, hypertension, hyperlipidemia, who presents to the ED due to shortness of breath.  [name] states that for the last 3 days, he has been experiencing gradually worsening shortness of breath at rest, dyspnea on exertion, productive cough, and orthopnea.  He has not noticed any lower extremity swelling or abdominal distention.  He endorses some nausea due to the cough but otherwise denies any fever, chills, vomiting, diarrhea.  He denies any chest pain.  [name] denies any recent lifestyle changes or diet changes, but notes that he has been grieving the loss of his fiance a couple months ago.  He has been taking his home medications as prescribed with the exception of Lasix  as it makes him urinate very frequently.  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  ED course: On arrival to the ED, patient was hypertensive at 174/118 with heart rate of 88.  He was saturating at 100% on room air with tachypnea at 39/minute.  He was afebrile at 98.4. Initial workup notable for platelets of 83, creatinine 1.49, BUN 25, GFR 50, troponin 38 and BNP of 3086.  D-dimer negative.  Respiratory panel pending.  Chest x-ray with cardiomegaly and vascular congestion.  Patient started on DuoNebs, Lasix , nitroglycerin .  TRH contacted for admission.  Review of Systems: As mentioned in the history of present illness. All other systems reviewed and are  negative.  Past Medical History:  Diagnosis Date   Acute on chronic respiratory failure with hypoxia and hypercapnia (HCC) 06/04/2022   Alcoholism and drug addiction in family    Arthritis    CHF (congestive heart failure) (HCC)    Chronic HFrEF (heart failure with reduced ejection fraction) (HCC)    a. 12/2016 Echo: EF 25-30%; b. 09/2020 Echo: EF 25-30%, glob HK, GrI DD, mod reduced RV fxn. ao root 39mm.   Chronic kidney disease    COPD (chronic obstructive pulmonary disease) (HCC)    Hypertension    Irregular heart beat    NICM (nonischemic cardiomyopathy) (HCC)    a. 12/2016 Echo: EF 25-30%; b. 07/2020 Echo: EF 25-30%; c. 09/2020 s/p BSX Emblem MRI S-ICD A219/166520.   Nonobstructive Coronary artery disease    a. 07/2020 Cath: LM nl, LAD min irregs, D1/2 nl, LCX nl,OM1 mild dzs, RCA min irregs, RPDA/RPAV nl.   Positive TB test    Seizure [facility])    witnessed by family in restaurant   Past Surgical History:  Procedure Laterality Date   CARDIAC CATHETERIZATION     COLONOSCOPY WITH PROPOFOL  N/A 08/23/2021   Procedure: COLONOSCOPY WITH PROPOFOL ;  Surgeon: Selena Daily, MD;  Location: [facility] ENDOSCOPY;  Service: Gastroenterology;  Laterality: N/A;   RIGHT/LEFT HEART CATH AND CORONARY ANGIOGRAPHY N/A 07/30/2020   Procedure: RIGHT/LEFT HEART CATH AND CORONARY ANGIOGRAPHY poss PCI;  Surgeon: Wenona Hamilton, MD;  Location: [facility] INVASIVE CV LAB;  Service: Cardiovascular;  Laterality: N/A;   SUBQ ICD IMPLANT N/A 10/15/2020   Procedure:  SUBQ ICD IMPLANT;  Surgeon: Boyce Byes, MD;  Location: [facility] INVASIVE CV LAB;  Service: Cardiovascular;  Laterality: N/A;   TONSILLECTOMY     Social History:  reports that he quit smoking about 6 years ago. His smoking use included cigarettes. He started smoking about 56 years ago. He has a 12.5 pack-year smoking history. He has never used smokeless tobacco. He reports that he does not currently use drugs after having used the following drugs: "Crack"  cocaine, Heroin, Marijuana, and LSD. He reports that he does not drink alcohol.  Allergies  Allergen Reactions   Penicillins Hives    Has patient had a PCN reaction causing immediate rash, facial/tongue/throat swelling, SOB or lightheadedness with hypotension: Yes Has patient had a PCN reaction causing severe rash involving mucus membranes or skin necrosis: No Has patient had a PCN reaction that required hospitalization: No Has patient had a PCN reaction occurring within the last 10 years: No If all of the above answers are "NO", then may proceed with Cephalosporin use. Has patient had a PCN reaction causing immediate rash, facial/tongue/throat swelling, SOB or lightheadedness with hypotension: Yes Has patient had a PCN reaction causing severe rash involving mucus membranes or skin necrosis: No Has patient had a PCN reaction that required hospitalization: No Has patient had a PCN reaction occurring within the last 10 years: No If all of the above answers are "NO", then may proceed with Cephalosporin use. Has patient had a PCN reaction causing immediate rash, facial/tongue/throat swelling, SOB or lightheadedness with hypotension: Yes Has patient had a PCN reaction causing severe rash involving mucus membranes or skin necrosis: No Has patient had a PCN reaction that required hospitalization: No Has patient had a PCN reaction occurring within the last 10 years: No If all of the above answers are "NO", then may proceed with Cephalosporin use.    Family History  Problem Relation Age of Onset   Hypertension Mother    Diabetes Mother    Alcohol abuse Mother    Hyperlipidemia Mother    Hypertension Sister    Alcohol abuse Sister    Asthma Sister    Depression Sister    Drug abuse Sister    Alcohol abuse Father    Hypertension Father    Hypertension Daughter    Early death Daughter    Drug abuse Daughter    Depression Daughter    Alcohol abuse Daughter     Prior to Admission  medications   Medication Sig Start Date End Date Taking? Authorizing Provider  acetaminophen  (TYLENOL ) 325 MG tablet Take 2 tablets (650 mg total) by mouth every 6 (six) hours as needed for fever. 10/16/20   Elgergawy, Ardia Kraft, MD  albuterol  (VENTOLIN  HFA) 108 (90 Base) MCG/ACT inhaler Inhale 2 puffs into the lungs every 6 (six) hours as needed for wheezing or shortness of breath. 05/17/22   Calista Catching, FNP  aspirin  EC 81 MG tablet Take 1 tablet (81 mg total) by mouth daily. 07/26/18   Wenona Hamilton, MD  benzonatate  (TESSALON ) 100 MG capsule Take 1 capsule (100 mg total) by mouth 3 (three) times daily as needed for cough. 06/15/21   Calista Catching, FNP  carvedilol  (COREG ) 25 MG tablet Take 0.5 tablets (12.5 mg total) by mouth 2 (two) times daily. PLEASE CALL OFFICE TO SCHEDULE APPOINTMENT PRIOR TO NEXT REFILL 10/13/22   Wenona Hamilton, MD  clotrimazole  (LOTRIMIN ) 1 % cream APPLY TO AFFECTED AREA TWICE A DAY 10/13/21   Arnett,  Hanley Lew, FNP  dapagliflozin  propanediol (FARXIGA ) 10 MG TABS tablet TAKE 1 TABLET BY MOUTH EVERY DAY BEFORE BREAKFAST 09/07/22   Calista Catching, FNP  ENTRESTO  97-103 MG TAKE 1 TABLET BY MOUTH TWICE A DAY 03/09/22   Calista Catching, FNP  feeding supplement (ENSURE ENLIVE / ENSURE PLUS) LIQD Take 237 mLs by mouth 3 (three) times daily between meals. 10/14/20   Garrison Kanner, MD  Fluticasone -Umeclidin-Vilant (TRELEGY ELLIPTA ) 100-62.5-25 MCG/ACT AEPB Inhale 1 puff into the lungs daily. 06/07/22   Montey Apa, DO  furosemide  (LASIX ) 40 MG tablet Take 2 tablets (80 mg total) by mouth daily. 01/07/22   Charlette Console, FNP  hydrocortisone  2.5 % cream Apply topically to face qhs t thur and Saturday for rash 01/13/22   Elta Halter, MD  ketoconazole  (NIZORAL ) 2 % cream Apply topically to face qhs on m w Friday for rash 01/13/22   Elta Halter, MD  nicotine  (NICODERM CQ  - DOSED IN MG/24 HOURS) 21 mg/24hr patch Place 1 patch (21 mg total) onto the skin daily.  06/07/22   Darus Engels A, DO  tamsulosin  (FLOMAX ) 0.4 MG CAPS capsule TAKE 1 CAPSULE BY MOUTH EVERY DAY 03/09/22   Stoioff, Kizzie Perks, MD  triamcinolone  (KENALOG ) 0.025 % cream Apply 1 application. topically 2 (two) times daily. Do not use longer than 7 days due to skin discoloration 07/12/21   Calista Catching, FNP  zolpidem  (AMBIEN ) 5 MG tablet Take 1 tablet (5 mg total) by mouth at bedtime as needed for sleep. 03/19/22 04/18/22  Brenna Cam, MD    Physical Exam: Vitals:   07/31/23 1100 07/31/23 1130 07/31/23 1200 07/31/23 1317  BP: (!) 174/118 (!) 169/111 (!) 166/107 (!) 164/107  Pulse: 90 93 95 96  Resp: (!) 39 (!) 40 (!) 42 18  Temp:    97.9 F (36.6 C)  TempSrc:    Oral  SpO2: 100% 100% 96% 98%  Weight:      Height:       Physical Exam Vitals and nursing note reviewed.  Constitutional:      General: He is not in acute distress.    Appearance: He is normal weight. He is not toxic-appearing.  HENT:     Head: Normocephalic and atraumatic.  Cardiovascular:     Rate and Rhythm: Normal rate and regular rhythm.     Heart sounds: No murmur heard. Pulmonary:     Effort: Tachypnea present. No accessory muscle usage.     Breath sounds: Examination of the right-upper field reveals wheezing. Examination of the left-upper field reveals wheezing. Examination of the right-middle field reveals rhonchi and rales. Examination of the left-middle field reveals rhonchi and rales. Examination of the right-lower field reveals rhonchi and rales. Examination of the left-lower field reveals rhonchi and rales. Wheezing, rhonchi and rales present. No decreased breath sounds.  Abdominal:     Palpations: Abdomen is soft.     Tenderness: There is no abdominal tenderness.  Musculoskeletal:     Right lower leg: No edema.     Left lower leg: No edema.  Skin:    General: Skin is warm and dry.  Neurological:     General: No focal deficit present.     Mental Status: He is alert and oriented to person, place,  and time.  Psychiatric:        Mood and Affect: Mood normal.        Behavior: Behavior normal.    Data Reviewed: CBC with  WBC of 4.3, hemoglobin of 14.7, platelets of 83 CMP with sodium of 138, potassium 4.4, bicarb 26, glucose 90, BUN 25, creatinine 0.49, AST 21, ALT 11, GFR 50 BNP 3086 Troponin 38 D-dimer negative  EKG personally reviewed.  Sinus rhythm with rate of 93.  T wave inversions noted that are chronic compared to EKG in April 2024.  No acute ischemic changes.  DG Chest 2 View Result Date: 07/31/2023 CLINICAL DATA:  Shortness of breath, chest pain EXAM: CHEST - 2 VIEW COMPARISON:  06/04/2022 FINDINGS: Mild cardiomegaly. Mediastinal contours within normal limits. Anterior defibrillator again noted, unchanged. Mild vascular congestion and perihilar interstitial prominence which may reflect early interstitial edema. No effusions or acute bony abnormality. IMPRESSION: Cardiomegaly with vascular congestion. Perihilar interstitial prominence may reflect early interstitial edema. Electronically Signed   By: Janeece Mechanic M.D.   On: 07/31/2023 12:03   Results are pending, will review when available.  Assessment and Plan:  * Acute on chronic HFrEF (heart failure with reduced ejection fraction) (HCC) Patient has a history of HFrEF with last EF of 25-30% in April 2024, in the setting of nonischemic cardiomyopathy.  He is now presenting with SOB/DOE/orthopnea with findings of pulmonary edema on imaging.  He is not taking Lasix  daily, only as needed, but otherwise compliant with GDMT.  - Telemetry monitoring - Continue Lasix  60 mg daily - Shifted out - Daily weights - Daily BMP and magnesium  - Echocardiogram  Thrombocytopenia (HCC) Chronic thrombocytopenia dating back to at least 2018.  No evidence of bleeding at this time.  COPD (chronic obstructive pulmonary disease) (HCC) Initial concern for COPD exacerbation, however history and chest x-ray more consistent with CHF  exacerbation.  - DuoNebs as needed - Continue home bronchodilators  Chronic kidney disease, stage 3b (HCC) History of CKD stage IIIb, with creatinine between 1.6 and 1.7, currently 1.5.  - Continue to monitor renal function while diuresing  History of drug abuse [facility]) Patient states he has been sober now for 9 years with no relapses.  HTN (hypertension) - Restart home regimen - Suspect blood pressure will improve with improvement in respiratory status  Advance Care Planning:   Code Status: Full Code   Consults: None  Family Communication: No family at bedside  Severity of Illness: The appropriate patient status for this patient is OBSERVATION. Observation status is judged to be reasonable and necessary in order to provide the required intensity of service to ensure the patient's safety. The patient's presenting symptoms, physical exam findings, and initial radiographic and laboratory data in the context of their medical condition is felt to place them at decreased risk for further clinical deterioration. Furthermore, it is anticipated that the patient will be medically stable for discharge from the hospital within 2 midnights of admission.   Author: Avi Body, MD 07/31/2023 1:49 PM  For on call review [email or web address].

## 2023-07-31 NOTE — Assessment & Plan Note (Signed)
 Patient has a history of HFrEF with last EF of 25-30% in April 2024, in the setting of nonischemic cardiomyopathy.  He is now presenting with SOB/DOE/orthopnea with findings of pulmonary edema on imaging.  He is not taking Lasix  daily, only as needed, but otherwise compliant with GDMT.  - Telemetry monitoring - Continue Lasix  60 mg daily - Shifted out - Daily weights - Daily BMP and magnesium  - Echocardiogram

## 2023-07-31 NOTE — Assessment & Plan Note (Signed)
 Chronic thrombocytopenia dating back to at least 2018.  No evidence of bleeding at this time.

## 2023-07-31 NOTE — Progress Notes (Signed)
 Patient arrived to 239A. VS stable and patient free from pain. Patient oriented to room and call bell in reach.

## 2023-07-31 NOTE — ED Triage Notes (Signed)
 Pt arrives via [facility] from home for SOB. Pt did home neb treatment without relief. EMS gave 1 albuterol  en route and gave 125mg  solumedrol with some improvement in sx. Expiratory wheezing heard on L lung sounds. Pt tachypneic upon arrival.

## 2023-07-31 NOTE — Assessment & Plan Note (Signed)
-   Restart home regimen - Suspect blood pressure will improve with improvement in respiratory status

## 2023-08-01 ENCOUNTER — Telehealth (HOSPITAL_COMMUNITY): Payer: Self-pay | Admitting: Pharmacy Technician

## 2023-08-01 ENCOUNTER — Other Ambulatory Visit (HOSPITAL_COMMUNITY): Payer: Self-pay

## 2023-08-01 ENCOUNTER — Inpatient Hospital Stay
Admit: 2023-08-01 | Discharge: 2023-08-01 | Disposition: A | Discharge status: Home / Self Care | Attending: Internal Medicine

## 2023-08-01 DIAGNOSIS — Z8249 Family history of ischemic heart disease and other diseases of the circulatory system: Secondary | ICD-10-CM | POA: Diagnosis not present

## 2023-08-01 DIAGNOSIS — M48061 Spinal stenosis, lumbar region without neurogenic claudication: Secondary | ICD-10-CM | POA: Diagnosis present

## 2023-08-01 DIAGNOSIS — Z7982 Long term (current) use of aspirin: Secondary | ICD-10-CM | POA: Diagnosis not present

## 2023-08-01 DIAGNOSIS — I509 Heart failure, unspecified: Secondary | ICD-10-CM

## 2023-08-01 DIAGNOSIS — Z6372 Alcoholism and drug addiction in family: Secondary | ICD-10-CM | POA: Diagnosis not present

## 2023-08-01 DIAGNOSIS — J211 Acute bronchiolitis due to human metapneumovirus: Secondary | ICD-10-CM | POA: Diagnosis present

## 2023-08-01 DIAGNOSIS — D696 Thrombocytopenia, unspecified: Secondary | ICD-10-CM | POA: Diagnosis present

## 2023-08-01 DIAGNOSIS — N1832 Chronic kidney disease, stage 3b: Secondary | ICD-10-CM | POA: Diagnosis present

## 2023-08-01 DIAGNOSIS — I130 Hypertensive heart and chronic kidney disease with heart failure and stage 1 through stage 4 chronic kidney disease, or unspecified chronic kidney disease: Secondary | ICD-10-CM | POA: Diagnosis present

## 2023-08-01 DIAGNOSIS — Z1152 Encounter for screening for COVID-19: Secondary | ICD-10-CM | POA: Diagnosis not present

## 2023-08-01 DIAGNOSIS — F17210 Nicotine dependence, cigarettes, uncomplicated: Secondary | ICD-10-CM | POA: Diagnosis present

## 2023-08-01 DIAGNOSIS — Z95810 Presence of automatic (implantable) cardiac defibrillator: Secondary | ICD-10-CM | POA: Diagnosis not present

## 2023-08-01 DIAGNOSIS — J440 Chronic obstructive pulmonary disease with acute lower respiratory infection: Secondary | ICD-10-CM | POA: Diagnosis present

## 2023-08-01 DIAGNOSIS — Z79899 Other long term (current) drug therapy: Secondary | ICD-10-CM | POA: Diagnosis not present

## 2023-08-01 DIAGNOSIS — I428 Other cardiomyopathies: Secondary | ICD-10-CM | POA: Diagnosis present

## 2023-08-01 DIAGNOSIS — T501X5A Adverse effect of loop [high-ceiling] diuretics, initial encounter: Secondary | ICD-10-CM | POA: Diagnosis not present

## 2023-08-01 DIAGNOSIS — Z811 Family history of alcohol abuse and dependence: Secondary | ICD-10-CM | POA: Diagnosis not present

## 2023-08-01 DIAGNOSIS — Z8349 Family history of other endocrine, nutritional and metabolic diseases: Secondary | ICD-10-CM | POA: Diagnosis not present

## 2023-08-01 DIAGNOSIS — J441 Chronic obstructive pulmonary disease with (acute) exacerbation: Secondary | ICD-10-CM | POA: Diagnosis present

## 2023-08-01 DIAGNOSIS — Z833 Family history of diabetes mellitus: Secondary | ICD-10-CM | POA: Diagnosis not present

## 2023-08-01 DIAGNOSIS — R0602 Shortness of breath: Secondary | ICD-10-CM | POA: Diagnosis present

## 2023-08-01 DIAGNOSIS — E785 Hyperlipidemia, unspecified: Secondary | ICD-10-CM | POA: Diagnosis present

## 2023-08-01 DIAGNOSIS — Z825 Family history of asthma and other chronic lower respiratory diseases: Secondary | ICD-10-CM | POA: Diagnosis not present

## 2023-08-01 DIAGNOSIS — Z8674 Personal history of sudden cardiac arrest: Secondary | ICD-10-CM | POA: Diagnosis not present

## 2023-08-01 DIAGNOSIS — I2510 Atherosclerotic heart disease of native coronary artery without angina pectoris: Secondary | ICD-10-CM | POA: Diagnosis present

## 2023-08-01 DIAGNOSIS — I5023 Acute on chronic systolic (congestive) heart failure: Secondary | ICD-10-CM | POA: Diagnosis present

## 2023-08-01 LAB — BASIC METABOLIC PANEL WITH GFR
Anion gap: 10 (ref 5–15)
BUN: 43 mg/dL — ABNORMAL HIGH (ref 8–23)
CO2: 24 mmol/L (ref 22–32)
Calcium: 8.7 mg/dL — ABNORMAL LOW (ref 8.9–10.3)
Chloride: 103 mmol/L (ref 98–111)
Creatinine, Ser: 1.65 mg/dL — ABNORMAL HIGH (ref 0.61–1.24)
GFR, Estimated: 44 mL/min — ABNORMAL LOW (ref >60)
Glucose, Bld: 130 mg/dL — ABNORMAL HIGH (ref 70–99)
Potassium: 4.2 mmol/L (ref 3.5–5.1)
Sodium: 137 mmol/L (ref 135–145)

## 2023-08-01 LAB — CBC WITH DIFFERENTIAL/PLATELET
Abs Immature Granulocytes: 0.01 10*3/uL (ref 0.00–0.07)
Basophils Absolute: 0 10*3/uL (ref 0.0–0.1)
Basophils Relative: 0 %
Eosinophils Absolute: 0 10*3/uL (ref 0.0–0.5)
Eosinophils Relative: 0 %
HCT: 47.6 % (ref 39.0–52.0)
Hemoglobin: 16 g/dL (ref 13.0–17.0)
Immature Granulocytes: 0 %
Lymphocytes Relative: 13 %
Lymphs Abs: 0.6 10*3/uL — ABNORMAL LOW (ref 0.7–4.0)
MCH: 30.1 pg (ref 26.0–34.0)
MCHC: 33.6 g/dL (ref 30.0–36.0)
MCV: 89.5 fL (ref 80.0–100.0)
Monocytes Absolute: 0.5 10*3/uL (ref 0.1–1.0)
Monocytes Relative: 11 %
Neutro Abs: 3.2 10*3/uL (ref 1.7–7.7)
Neutrophils Relative %: 76 %
Platelets: 120 10*3/uL — ABNORMAL LOW (ref 150–400)
RBC: 5.32 MIL/uL (ref 4.22–5.81)
RDW: 13.7 % (ref 11.5–15.5)
WBC: 4.3 10*3/uL (ref 4.0–10.5)
nRBC: 0 % (ref 0.0–0.2)

## 2023-08-01 LAB — HIV ANTIBODY (ROUTINE TESTING W REFLEX): HIV Screen 4th Generation wRfx: NONREACTIVE

## 2023-08-01 LAB — MAGNESIUM: Magnesium: 2.2 mg/dL (ref 1.7–2.4)

## 2023-08-01 MED ORDER — GUAIFENESIN-DM 100-10 MG/5ML PO SYRP
5.0000 mL | ORAL_SOLUTION | ORAL | Status: DC | PRN
  Administered 2023-08-01 – 2023-08-02 (×6): 5 mL via ORAL
  Filled 2023-08-01 (×6): qty 10

## 2023-08-01 MED ORDER — CARVEDILOL 3.125 MG PO TABS
3.1250 mg | ORAL_TABLET | Freq: Two times a day (BID) | ORAL | Status: DC
  Administered 2023-08-01 – 2023-08-03 (×4): 3.125 mg via ORAL
  Filled 2023-08-01 (×4): qty 1

## 2023-08-01 MED ORDER — TRAZODONE HCL 50 MG PO TABS
25.0000 mg | ORAL_TABLET | Freq: Every evening | ORAL | Status: DC | PRN
  Administered 2023-08-01 – 2023-08-02 (×2): 25 mg via ORAL
  Filled 2023-08-01 (×2): qty 1

## 2023-08-01 NOTE — Plan of Care (Signed)

## 2023-08-01 NOTE — Care Management Obs Status (Signed)
 MEDICARE OBSERVATION STATUS NOTIFICATION   Patient Details  Name: Jonathon Rios MRN: [id number] Date of Birth: 1950-12-22   Medicare Observation Status Notification Given:  No (patient did not want a copy)    Anise Kerns 08/01/2023, 11:59 AM

## 2023-08-01 NOTE — Telephone Encounter (Signed)
 Patient Product/process development scientist completed.    The patient is insured through [facility]. Patient has Medicare and is not eligible for a copay card, but may be able to apply for patient assistance or Medicare RX Payment Plan (Patient Must reach out to their plan, if eligible for payment plan), if available.    Ran test claim for Entresto  24-26 mg and the current 30 day co-pay is $0.00.  Ran test claim for Jardiance 10 mg and the current 30 day co-pay is $0.00.  Ran test claim for Farxiga  10 mg and 100 day supply was filled on 07/03/2023  This test claim was processed through [facility]- copay amounts may vary at other pharmacies due to pharmacy/plan contracts, or as the patient moves through the different stages of their insurance plan.     Morgan Arab, CPHT Pharmacy Technician III Certified Patient Advocate [facility] Pharm[facility] Patient Advocate Team Direct Number: [phone]  Fax: [phone]

## 2023-08-01 NOTE — Progress Notes (Signed)
 PROGRESS NOTE   HPI was taken from Dr. Guss Legacy: Jonathon Rios is a 73 y.o. male with medical history significant of HFrEF with last EF of 25-30% (April 2024), V-fib arrest s/p AICD (2022), nonobstructive CAD, polysubstance abuse, COPD, CKD stage IIIb, hypertension, hyperlipidemia, who presents to the ED due to shortness of breath.   Jonathon Rios states that for the last 3 days, he has been experiencing gradually worsening shortness of breath at rest, dyspnea on exertion, productive cough, and orthopnea.  He has not noticed any lower extremity swelling or abdominal distention.  He endorses some nausea due to the cough but otherwise denies any fever, chills, vomiting, diarrhea.  He denies any chest pain.   Jonathon Rios denies any recent lifestyle changes or diet changes, but notes that he has been grieving the loss of his fiance a couple months ago.  He has been taking his home medications as prescribed with the exception of Lasix  as it makes him urinate very frequently.  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   ED course: On arrival to the ED, patient was hypertensive at 174/118 with heart rate of 88.  He was saturating at 100% on room air with tachypnea at 39/minute.  He was afebrile at 98.4. Initial workup notable for platelets of 83, creatinine 1.49, BUN 25, GFR 50, troponin 38 and BNP of 3086.  D-dimer negative.  Respiratory panel pending.  Chest x-ray with cardiomegaly and vascular congestion.  Patient started on DuoNebs, Lasix , nitroglycerin .  TRH contacted for admission.   Jonathon Rios  GNF:[id number] DOB: Jul 26, 1950 DOA: 07/31/2023 PCP: Arlen Belton, MD   Assessment & Plan:   Principal Problem:   Acute on chronic HFrEF (heart failure with reduced ejection fraction) (HCC) Active Problems:   HTN (hypertension)   History of drug abuse (HCC)   Chronic kidney disease, stage 3b (HCC)   COPD (chronic  obstructive pulmonary disease) (HCC)   Thrombocytopenia (HCC)  Assessment and Plan: Acute on chronic systolic CHF:  last echo showed EF of 25-30% in April 2024, in the setting of nonischemic cardiomyopathy. Pt stopped taking lasix  approx 9 months ago b/c "I was urinating too much." Does not monitor fluid or salt intake. Pt thinks he started seeing [facility] but not sure, poor historian. Continue on IV lasix  & coreg , entresto , statin, farxiga . Consider holding lasix  tomorrow if Cr continues to rise. Monitor I/Os. Echo ordered.   Thrombocytopenia: chronic. Will continue to monitor   COPD: w/o exacerbation. Bronchodilators prn    CKDIIIb: Cr is trending up from day prior. Likely secondary to lasix     History of drug abuse: has been sober x 9 years w/o relapses as per pt    HTN: continue on coreg , entresto ,      DVT prophylaxis: lovenox  Code Status: full  Family Communication:  Disposition Plan: likely d/c back home   Level of care: Telemetry Cardiac  Status is: Observation The patient remains OBS appropriate and will d/c before 2 midnights. Requiring IV lasix      Consultants:    Procedures:   Antimicrobials:   Subjective: Pt c/o cough   Objective: Vitals:   08/01/23 0013 08/01/23 0304 08/01/23 0523 08/01/23 0820  BP: (!) 128/101 (!) 146/108  (!) 146/103  Pulse: 80 75  77  Resp: 18 20  18   Temp: 97.8 F (36.6 C) 97.9 F (36.6 C)  97.9 F (36.6 C)  TempSrc:  SpO2: 93% 96%  96%  Weight:   75.2 kg   Height:        Intake/Output Summary (Last 24 hours) at 08/01/2023 0853 Last data filed at 08/01/2023 0359 Gross per 24 hour  Intake 360 ml  Output 2100 ml  Net -1740 ml   Filed Weights   07/31/23 1012 08/01/23 0523  Weight: 76.7 kg 75.2 kg    Examination:  General exam: Appears calm and comfortable. Poor eye contact   Respiratory system: course breath sounds b/l  Cardiovascular system: S1 & S2 . No rubs, gallops or clicks.  Gastrointestinal system:  Abdomen is nondistended, soft and nontender.  Normal bowel sounds heard. Central nervous system: Alert and oriented. Moves all extremities  Psychiatry: Judgement and insight appears at baseline. Flat mood and affect     Data Reviewed: I have personally reviewed following labs and imaging studies  CBC: Recent Labs  Lab 07/31/23 1028 08/01/23 0421  WBC 4.3 4.3  NEUTROABS  --  3.2  HGB 14.7 16.0  HCT 44.9 47.6  MCV 92.8 89.5  PLT 83* 120*   Basic Metabolic Panel: Recent Labs  Lab 07/31/23 1145 08/01/23 0421  NA 138 137  K 4.4 4.2  CL 103 103  CO2 26 24  GLUCOSE 90 130*  BUN 25* 43*  CREATININE 1.49* 1.65*  CALCIUM  8.4* 8.7*  MG  --  2.2   GFR: Estimated Creatinine Clearance: 44.3 mL/min (A) (by C-G formula based on SCr of 1.65 mg/dL (H)). Liver Function Tests: Recent Labs  Lab 07/31/23 1145  AST 21  ALT 11  ALKPHOS 46  BILITOT 1.2  PROT 7.2  ALBUMIN 3.6   No results for input(s): "LIPASE", "AMYLASE" in the last 168 hours. No results for input(s): "AMMONIA" in the last 168 hours. Coagulation Profile: No results for input(s): "INR", "PROTIME" in the last 168 hours. Cardiac Enzymes: No results for input(s): "CKTOTAL", "CKMB", "CKMBINDEX", "TROPONINI" in the last 168 hours. BNP (last 3 results) No results for input(s): "PROBNP" in the last 8760 hours. HbA1C: No results for input(s): "HGBA1C" in the last 72 hours. CBG: No results for input(s): "GLUCAP" in the last 168 hours. Lipid Profile: No results for input(s): "CHOL", "HDL", "LDLCALC", "TRIG", "CHOLHDL", "LDLDIRECT" in the last 72 hours. Thyroid  Function Tests: No results for input(s): "TSH", "T4TOTAL", "FREET4", "T3FREE", "THYROIDAB" in the last 72 hours. Anemia Panel: No results for input(s): "VITAMINB12", "FOLATE", "FERRITIN", "TIBC", "IRON", "RETICCTPCT" in the last 72 hours. Sepsis Labs: No results for input(s): "PROCALCITON", "LATICACIDVEN" in the last 168 hours.  Recent Results (from the past  240 hours)  Resp panel by RT-PCR (RSV, Flu A&B, Covid) Anterior Nasal Swab     Status: None   Collection Time: 07/31/23 11:28 AM   Specimen: Anterior Nasal Swab  Result Value Ref Range Status   SARS Coronavirus 2 by RT PCR NEGATIVE NEGATIVE Final    Comment: (NOTE) SARS-CoV-2 target nucleic acids are NOT DETECTED.  The SARS-CoV-2 RNA is generally detectable in upper respiratory specimens during the acute phase of infection. The lowest concentration of SARS-CoV-2 viral copies this assay can detect is 138 copies/mL. A negative result does not preclude SARS-Cov-2 infection and should not be used as the sole basis for treatment or other patient management decisions. A negative result may occur with  improper specimen collection/handling, submission of specimen other than nasopharyngeal swab, presence of viral mutation(s) within the areas targeted by this assay, and inadequate number of viral copies(<138 copies/mL). A negative result must be  combined with clinical observations, patient history, and epidemiological information. The expected result is Negative.  Fact Sheet for Patients:  BloggerCourse.com  Fact Sheet for Healthcare Providers:  SeriousBroker.it  This test is no t yet approved or cleared by the [place]  FDA and  has been authorized for detection and/or diagnosis of SARS-CoV-2 by FDA under an Emergency Use Authorization (EUA). This EUA will remain  in effect (meaning this test can be used) for the duration of the COVID-19 declaration under Section 564(b)(1) of the Act, 21 U.S.C.section 360bbb-3(b)(1), unless the authorization is terminated  or revoked sooner.       Influenza A by PCR NEGATIVE NEGATIVE Final   Influenza B by PCR NEGATIVE NEGATIVE Final    Comment: (NOTE) The Xpert Xpress SARS-CoV-2/FLU/RSV plus assay is intended as an aid in the diagnosis of influenza from Nasopharyngeal swab specimens and should  not be used as a sole basis for treatment. Nasal washings and aspirates are unacceptable for Xpert Xpress SARS-CoV-2/FLU/RSV testing.  Fact Sheet for Patients: BloggerCourse.com  Fact Sheet for Healthcare Providers: SeriousBroker.it  This test is not yet approved or cleared by the [place]  FDA and has been authorized for detection and/or diagnosis of SARS-CoV-2 by FDA under an Emergency Use Authorization (EUA). This EUA will remain in effect (meaning this test can be used) for the duration of the COVID-19 declaration under Section 564(b)(1) of the Act, 21 U.S.C. section 360bbb-3(b)(1), unless the authorization is terminated or revoked.     Resp Syncytial Virus by PCR NEGATIVE NEGATIVE Final    Comment: (NOTE) Fact Sheet for Patients: BloggerCourse.com  Fact Sheet for Healthcare Providers: SeriousBroker.it  This test is not yet approved or cleared by the [place]  FDA and has been authorized for detection and/or diagnosis of SARS-CoV-2 by FDA under an Emergency Use Authorization (EUA). This EUA will remain in effect (meaning this test can be used) for the duration of the COVID-19 declaration under Section 564(b)(1) of the Act, 21 U.S.C. section 360bbb-3(b)(1), unless the authorization is terminated or revoked.  Performed at [facility], [address], [place], [place] [address]          Radiology Studies: DG Chest 2 View Result Date: 07/31/2023 CLINICAL DATA:  Shortness of breath, chest pain EXAM: CHEST - 2 VIEW COMPARISON:  06/04/2022 FINDINGS: Mild cardiomegaly. Mediastinal contours within normal limits. Anterior defibrillator again noted, unchanged. Mild vascular congestion and perihilar interstitial prominence which may reflect early interstitial edema. No effusions or acute bony abnormality. IMPRESSION: Cardiomegaly with vascular congestion. Perihilar  interstitial prominence may reflect early interstitial edema. Electronically Signed   By: Janeece Mechanic M.D.   On: 07/31/2023 12:03        Scheduled Meds:  aspirin  EC  81 mg Oral Daily   budesonide -glycopyrrolate -formoterol   2 puff Inhalation BID   carvedilol   12.5 mg Oral BID   dapagliflozin  propanediol  10 mg Oral QAC breakfast   enoxaparin  (LOVENOX ) injection  40 mg Subcutaneous Q24H   furosemide   60 mg Intravenous Daily   rosuvastatin   10 mg Oral Daily   sacubitril -valsartan   1 tablet Oral BID   sodium chloride  flush  3 mL Intravenous Q12H   tamsulosin   0.4 mg Oral Daily   Continuous Infusions:  sodium chloride        LOS: 0 days      Alphonsus Jeans, MD [facility] Pager [phone]  If 7PM-7AM, please contact night-coverage [email or web address] 08/01/2023, 8:53 AM

## 2023-08-01 NOTE — Progress Notes (Signed)
 ReDS Vest / Clip - 08/01/23 1010       ReDS Vest / Clip   Station Marker C    Ruler Value 31    ReDS Value Range Moderate volume overload    ReDS Actual Value 36            Celedonio Coil, RN, BSN [facility] Heart Failure Navigator Secure Chat Only

## 2023-08-01 NOTE — Progress Notes (Signed)
 Heart Failure Stewardship Pharmacy Note  PCP: Tawnya Fava, Waddell Guess, MD PCP-Cardiologist: Percival Brace, MD  HPI: Jonathon Rios is a 73 y.o. 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 On admission, BNP was 3086.9, HS-troponin was 38, and D-dimer was 0.27. Chest x-ray noted cardiomegaly with vascular congestion and possible early interstitial edema.   Pertinent cardiac history: Echo in 12/2016 with LVEF of 25-30% and G1DD. LVEF unchanged in 04/2019 and 07/2020. LHC in 07/2020 showed no obstructive CAD. RHC at the same time with PCW 14 mmHg, PA 45/18 (27) mmHg, Fick CO 5 L/min, CI 2.36 L/min/m2. VF arrest in 09/2020. Echo with LVEF unchanged. Emblem S-ICD implanted at that time. Most recent echo in 05/2022 showed LVEF of 25-30%, mild LVH, grade II diastolic dysfunction.  Pertinent Lab Values: Creatinine, Ser  Date Value Ref Range Status  08/01/2023 1.65 (H) 0.61 - 1.24 mg/dL Final   BUN  Date Value Ref Range Status  08/01/2023 43 (H) 8 - 23 mg/dL Final  16/11/9602 21 8 - 27 mg/dL Final   Potassium  Date Value Ref Range Status  08/01/2023 4.2 3.5 - 5.1 mmol/L Final   Sodium  Date Value Ref Range Status  08/01/2023 137 135 - 145 mmol/L Final  01/11/2021 144 134 - 144 mmol/L Final   B Natriuretic Peptide  Date Value Ref Range Status  07/31/2023 3,086.9 (H) 0.0 - 100.0 pg/mL Final    Comment:    Performed at [facility], [address] [place], [place] [address]   Magnesium   Date Value Ref Range Status  08/01/2023 2.2 1.7 - 2.4 mg/dL Final    Comment:    Performed at [facility], [address] [place], [place] [address]   Hgb A1c MFr Bld  Date Value Ref Range Status  10/08/2020 5.5 4.8 - 5.6 % Final    Comment:    (NOTE) Pre diabetes:           5.7%-6.4%  Diabetes:              >6.4%  Glycemic control for   <7.0% adults with diabetes    TSH  Date Value Ref Range Status  10/10/2020 0.615 0.450 - 4.500 uIU/mL Final    Vital Signs:  Temp:  [97.8 F (36.6 C)-98.4 F (36.9 C)] 97.9 F (36.6 C) (06/03 0304) Pulse Rate:  [75-96] 75 (06/03 0304) Cardiac Rhythm: Normal sinus rhythm (06/02 1900) Resp:  [18-42] 20 (06/03 0304) BP: (128-184)/(99-123) 146/108 (06/03 0304) SpO2:  [93 %-100 %] 96 % (06/03 0304) Weight:  [75.2 kg (165 lb 12.6 oz)-76.7 kg (169 lb)] 75.2 kg (165 lb 12.6 oz) (06/03 0523)  Intake/Output Summary (Last 24 hours) at 08/01/2023 0742 Last data filed at 08/01/2023 0359 Gross per 24 hour  Intake 360 ml  Output 2100 ml  Net -1740 ml    Current Heart Failure Medications:  Loop diuretic: furosemide  60 mg IV daily Beta-Blocker: carvedilol  12.5 mg BID ACEI/ARB/ARNI: Entresto  97-103 mg BID MRA: none SGLT2i: Farxiga  10 mg daily  Prior to admission Heart Failure Medications:  Loop diuretic: none Beta-Blocker: carvedilol  3.125 mg BID ACEI/ARB/ARNI: Entresto  97-103 mg BID (reports taking, no fill history) MRA: none SGLT2i: Farxiga  10 mg daily  Assessment: 1. Acute on chronic combined systolic and diastolic heart failure (LVEF 25-30%) with grade II diastolic dysfunction, due to NICM. NYHA class  III-IV symptoms.  -Symptoms: Patient reports persistent shortness of breath with minimal exertion, orthopnea, poor appetite, and mild abdominal distention. Denies LEE.  -Volume: Initial weight in ED was reported, not measured. Wt today 165.79 lbs. 1.8: UOP charted yesterday, though suspect it is incomplete. REDS today is 36%. Patient reports urine color is light. Creatinine and BUN are trending up today. Given furosemide  60 mg IV yesterday and today. Patient will require further diuresis, however, response to furosemide  has not been excellent. Worry that low output could be contributing. Patient may benefit from further  evaluation. -Hemodynamics: BP has been persistently elevated. Symptoms at presentation lead to concern for low output heart failure. Last RHC was in 2022. HR 70s on BB.  -BB: Currently on carvedilol  12.5 mg BID, which is greater that home dose of 3.125 mg BID noted at last cardiology visit and most recent fill. Given patient symptoms, would recommend reducing BB dose for the time being. -ACEI/ARB/ARNI: Currently on Entresto  97-103 mg BID. Patient reports taking this at home, however, there is no recent fill history. -MRA: Patient would benefit from starting spironolactone, though creatinine is trending up today. Can consider starting when creatinine is trending down. -SGLT2i: Continue Farxiga  10 mg daily.  Plan: 1) Medication changes recommended at this time: -Consider reducing carvedilol  to 3.125 mg BID  2) Patient assistance: -Pending  3) Education: - Patient has been educated on current HF medications and potential additions to HF medication regimen - Patient verbalizes understanding that over the next few months, these medication doses may change and more medications may be added to optimize HF regimen - Patient has been educated on basic disease state pathophysiology and goals of therapy  Medication Assistance / Insurance Benefits Check: Does the patient have prescription insurance?    Type of insurance plan:  Does the patient qualify for medication assistance through manufacturers or grants? Pending   Outpatient Pharmacy: [facility] to admission [facility]      Please do not hesitate to reach out with questions or concerns,  Bevely Brush, PharmD, CPP, BCPS Heart Failure Pharmacist  Phone - [phone] 08/01/2023 11:19 AM

## 2023-08-01 NOTE — Progress Notes (Signed)
 Heart Failure Navigator Progress Note  Assessed for Heart & Vascular TOC clinic readiness.  Does not meet criteria due to current [facility] patient.   Navigator will sign off at this time.  Roxy Horseman, RN, BSN [facility] Heart Failure Navigator Secure Chat Only

## 2023-08-02 ENCOUNTER — Other Ambulatory Visit (HOSPITAL_COMMUNITY): Payer: Self-pay

## 2023-08-02 ENCOUNTER — Encounter: Payer: Self-pay | Admitting: Internal Medicine

## 2023-08-02 ENCOUNTER — Telehealth (HOSPITAL_COMMUNITY): Payer: Self-pay | Admitting: Pharmacy Technician

## 2023-08-02 DIAGNOSIS — I5023 Acute on chronic systolic (congestive) heart failure: Secondary | ICD-10-CM | POA: Diagnosis not present

## 2023-08-02 LAB — TECHNOLOGIST SMEAR REVIEW: Plt Morphology: NORMAL

## 2023-08-02 LAB — CBC WITH DIFFERENTIAL/PLATELET
Abs Immature Granulocytes: 0.01 10*3/uL (ref 0.00–0.07)
Basophils Absolute: 0 10*3/uL (ref 0.0–0.1)
Basophils Relative: 1 %
Eosinophils Absolute: 0 10*3/uL (ref 0.0–0.5)
Eosinophils Relative: 1 %
HCT: 54.5 % — ABNORMAL HIGH (ref 39.0–52.0)
Hemoglobin: 18.4 g/dL — ABNORMAL HIGH (ref 13.0–17.0)
Immature Granulocytes: 0 %
Lymphocytes Relative: 28 %
Lymphs Abs: 1.2 10*3/uL (ref 0.7–4.0)
MCH: 30.4 pg (ref 26.0–34.0)
MCHC: 33.8 g/dL (ref 30.0–36.0)
MCV: 89.9 fL (ref 80.0–100.0)
Monocytes Absolute: 0.5 10*3/uL (ref 0.1–1.0)
Monocytes Relative: 10 %
Neutro Abs: 2.7 10*3/uL (ref 1.7–7.7)
Neutrophils Relative %: 60 %
Platelets: 109 10*3/uL — ABNORMAL LOW (ref 150–400)
RBC: 6.06 MIL/uL — ABNORMAL HIGH (ref 4.22–5.81)
RDW: 13.9 % (ref 11.5–15.5)
Smear Review: NORMAL
WBC: 4.5 10*3/uL (ref 4.0–10.5)
nRBC: 0 % (ref 0.0–0.2)

## 2023-08-02 LAB — RESPIRATORY PANEL BY PCR

## 2023-08-02 LAB — CBC
HCT: 51.4 % (ref 39.0–52.0)
Hemoglobin: 17.2 g/dL — ABNORMAL HIGH (ref 13.0–17.0)
MCH: 29.9 pg (ref 26.0–34.0)
MCHC: 33.5 g/dL (ref 30.0–36.0)
MCV: 89.4 fL (ref 80.0–100.0)
Platelets: 108 10*3/uL — ABNORMAL LOW (ref 150–400)
RBC: 5.75 MIL/uL (ref 4.22–5.81)
RDW: 13.8 % (ref 11.5–15.5)
WBC: 5.6 10*3/uL (ref 4.0–10.5)
nRBC: 0 % (ref 0.0–0.2)

## 2023-08-02 LAB — BASIC METABOLIC PANEL WITH GFR
Anion gap: 10 (ref 5–15)
BUN: 42 mg/dL — ABNORMAL HIGH (ref 8–23)
CO2: 26 mmol/L (ref 22–32)
Calcium: 8.7 mg/dL — ABNORMAL LOW (ref 8.9–10.3)
Chloride: 99 mmol/L (ref 98–111)
Creatinine, Ser: 1.58 mg/dL — ABNORMAL HIGH (ref 0.61–1.24)
GFR, Estimated: 47 mL/min — ABNORMAL LOW (ref >60)
Glucose, Bld: 108 mg/dL — ABNORMAL HIGH (ref 70–99)
Potassium: 4.2 mmol/L (ref 3.5–5.1)
Sodium: 135 mmol/L (ref 135–145)

## 2023-08-02 LAB — ECHOCARDIOGRAM COMPLETE
Area-P 1/2: 3.77 cm2
Height: 75 in
S' Lateral: 5 cm
Weight: 2608 [oz_av]

## 2023-08-02 LAB — PROCALCITONIN: Procalcitonin: <0.1 ng/mL

## 2023-08-02 LAB — MAGNESIUM: Magnesium: 2 mg/dL (ref 1.7–2.4)

## 2023-08-02 LAB — SARS CORONAVIRUS 2 BY RT PCR: SARS Coronavirus 2 by RT PCR: NEGATIVE

## 2023-08-02 MED ORDER — FUROSEMIDE 10 MG/ML IJ SOLN
80.0000 mg | Freq: Two times a day (BID) | INTRAMUSCULAR | Status: DC
  Administered 2023-08-02 – 2023-08-03 (×2): 80 mg via INTRAVENOUS
  Filled 2023-08-02 (×2): qty 8

## 2023-08-02 MED ORDER — PREDNISONE 20 MG PO TABS
40.0000 mg | ORAL_TABLET | Freq: Every day | ORAL | Status: DC
  Administered 2023-08-02 – 2023-08-03 (×2): 40 mg via ORAL
  Filled 2023-08-02 (×2): qty 2

## 2023-08-02 MED ORDER — GUAIFENESIN ER 600 MG PO TB12
600.0000 mg | ORAL_TABLET | Freq: Two times a day (BID) | ORAL | Status: DC
  Administered 2023-08-02 – 2023-08-03 (×4): 600 mg via ORAL
  Filled 2023-08-02 (×4): qty 1

## 2023-08-02 MED ORDER — NICOTINE 21 MG/24HR TD PT24
21.0000 mg | MEDICATED_PATCH | Freq: Every day | TRANSDERMAL | Status: DC
  Administered 2023-08-02 – 2023-08-03 (×2): 21 mg via TRANSDERMAL
  Filled 2023-08-02 (×2): qty 1

## 2023-08-02 NOTE — Discharge Instructions (Addendum)
 Rent/Utility/Housing  Agency Name: [facility] Address: [address] [place], [place] [address] Phone: [phone] Email: [email or web address] Website: [email or web address] Service(s) Offered: Housing services, self-sufficiency, congregate meal program, weatherization program, Field seismologist program, emergency food assistance,  housing counseling, home ownership program, wheels -towork program.  Agency Name: [facility] Address: [address], [place], [place] [address] Phone: [phone] (8a-4p) [phone] (8p- 10p) Email: [email or web address] Website: [email or web address] Service(s) Offered: A program for homeless and/or needy men that includes one-on-one counseling, life skills training and job rehabilitation.  Agency Name: [employer] [facility] [place] Address: [address] [place], [place] [address] Phone: [phone] Website: www.alliedchurches.org Service(s) Offered: Assistance to needy in emergency with utility bills, heating fuel, and prescriptions. Shelter for homeless 7pm-7am. June 23, 2016 15  Agency Name: [facility] [place] [facility] Address: [address], [place], [place] [address] Phone: [phone] Contact Person: Genie Key Email: [email or web address] Website: [email or web address].[email or web address] Service(s) Offered: Helps individuals with developmental disabilities move from housing that is more restrictive to homes where they  can achieve greater independence and have more  opportunities.  Agency Name: [facility] Address: [address], [place], [place] [address] Phone: [phone] Email: [email or web address] Website: www.[email or web address].org Service(s) Offered: Provides affordable housing for low-income families, elderly, and disabled individuals. Offer a wide range of  programs and services, from financial planning to  afterschool and summer programs.  Agency Name: [facility] [employer] Address: [address] [place], [place] [address] Phone: [phone] Service(s) Offered: Child support services; child welfare services; food stamps; Medicaid; work first family assistance; and aid with fuel,  rent, food and medicine.  Agency Name: [facility] [place] [employer] Address: [facility] [address], [place], [place]  [address] Phone: [phone] Website: www.familyabuseservices.org Service(s) Offered: 24 hour Crisis Line: [phone]; 24 hour Emergency Shelter; Transitional Housing; Support Groups; Scientist, physiological; [employer]; Hispanic Outreach: [phone];  Visitation Center: [phone].  Agency Name: [facility] [place]. Address: [address] [place], [place] [address] Phone: [phone] Service(s) Offered: CAP Services; Home and [employer]; Individual or Group Supports; Respite Care Non-Institutional Nursing;  Residential Supports; Respite Care and Personal Care Services; Transportation; Family and Friends Night; Recreational Activities; Three Nutritious Meals/Snacks; Consultation with Registered Dietician; Twenty-four hour Registered Nurse Access; Daily and [employer]; Camp Green Leaves; [place] for the [employer] (During Summer Months) Bingo Night (Every  Wednesday Night); Special Populations Dance Night  (Every Tuesday Night); Professional Hair Care Services.  Agency Name: [facility] Address: [address], [place], [place] [address] Phone: [phone] Contact Person: Richardo Chandler Website: [email or web address].Physicist, medical) Offered: Residential treatment facility for women; food and  clothing, educational & employment development and  transportation to work; Counsellor of financial skills;  parenting and family reunification; emotional and spiritual  support;  transitional housing for program graduates.  Agency Name: [facility] Address: [address] [place], [place] [address] Phone: [phone] Email: [email or web address] Website: TaskTown.es Service(s) Offered: Public housing units for elderly, disabled, and low income people; housing choice vouchers for income eligible  applicants; shelter plus care vouchers; and Psychologist, clinical.  Agency Name: [facility] [employer] Address: [address], [place], [place] [address] Phone: [phone] Email: habitat1@[email or web address] Website: www.habitatalamance.org Service(s) Offered: Build houses for families in need of decent housing. Each adult in the family must invest 200 hours of labor on  someone else's house, work with volunteers to build their own house, attend classes  on budgeting, home maintenance, yard care, and attend homeowner association meetings.  Agency Name: [facility] Address: [address] [place], [place] [address] Phone: [phone] Website: www.rsli.org Service(s) Offered: Intermediate care facilities for intellectually delayed, Supervised Living in group homes for adults with developmental disabilities, Supervised Living for people who have dual diagnoses (MRMI), Independent Living, Supported Living, respite and a variety of CAP services, pre-vocational services, day supports, and [employer].  Agency Name: [place] [employer] Phone: [phone] Website: www.NCForeclosurePrevention.gov Service(s) Offered: Zero-interest, deferred loans to homeowners struggling to pay their mortgage. Call for more information.   Do you feel isolated?  The Institute on Aging offers a [employer] that anyone can call toll free at [phone]. The friendship line is available 24 hours a day  [employer] is a Program of All-inclusive Care for the Elderly (PACE). Their mission is to  promote and sustain the independence of seniors wishing to remain in the community. They provide seniors with comprehensive long-term health, social, medical and dietary care. Their program is a safe alternative to nursing home care. [phone]  [facility] Physical Address [facility] [address] [place], [place] [address] Phone: [phone]. . Online zoom yoga class, connect with others without leaving your home [facility] offers [place] dance cardio sessions for individuals via Zoom. This program provides: - Dance fitness activities Please contact program for more information. Servinganyone in need adults 18+ hiv/aids individuals families Call [phone]  Email [email or web address] to get more info  [employer] offers an online [employer] to individuals where they can receive help to focus on their best health. Whether you're a [employer] member or not, the neighborhood center offers a... Main Serviceshealth education  exercise & fitness  community support services  recreation  virtual support Other Servicessupport groups Servinganyone in need adults young adults teens seniors individuals families [email or web address] to get more info  Schedule on their website  The [facility] offers an array of activities for adults age 60 and over. This program provides:- Fitness and health programs- Tech classes- Activity books Main Serviceshealth education  community support services  exercise & fitness  recreation  more education Servingseniors  Call [phone]    For more resources go online to [email or web address].[email or web address].uk and type in you zipcode

## 2023-08-02 NOTE — Plan of Care (Signed)

## 2023-08-02 NOTE — Progress Notes (Signed)
 Heart Failure Stewardship Pharmacy Note  PCP: Jonathon Rios, Jonathon Guess, MD PCP-Cardiologist: Patient appears to see Jonathon Kirks, MD Jonathon Liner, MD and Jonathon Brace, MD  HPI: Jonathon Rios is a 73 y.o. 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 On admission, BNP was 3086.9, HS-troponin was 38, and D-dimer was 0.27. Chest x-ray noted cardiomegaly with vascular congestion and possible early interstitial edema. Jonathon Rios reports that the last time he remembers feeling normal was September of 2024. He has had progressive dyspnea on exertion and fatigue since that time, with more rapid worsening in the last week.  Pertinent cardiac history: Echo in 12/2016 with LVEF of 25-30% and G1DD. LVEF unchanged in 04/2019 and 07/2020. LHC in 07/2020 showed no obstructive CAD. RHC at the same time with PCW 14 mmHg, PA 45/18 (27) mmHg, Fick CO 5 L/min, CI 2.36 L/min/m2. VF arrest in 09/2020. Echo with LVEF unchanged. Emblem S-ICD implanted at that time. Echo in 05/2022 showed LVEF of 25-30%, mild LVH, grade II diastolic dysfunction. Echo this admission noted LVEF of 20-25%, G1DD, mild MR.  Pertinent Lab Values: Creatinine, Ser  Date Value Ref Range Status  08/02/2023 1.58 (H) 0.61 - 1.24 mg/dL Final   BUN  Date Value Ref Range Status  08/02/2023 42 (H) 8 - 23 mg/dL Final  09/81/1914 21 8 - 27 mg/dL Final   Potassium  Date Value Ref Range Status  08/02/2023 4.2 3.5 - 5.1 mmol/L Final   Sodium  Date Value Ref Range Status  08/02/2023 135 135 - 145 mmol/L Final  01/11/2021 144 134 - 144 mmol/L Final   B Natriuretic Peptide  Date Value Ref Range Status  07/31/2023 3,086.9 (H) 0.0 - 100.0 pg/mL Final    Comment:    Performed at [facility], [address] [place], [place] [address]   Magnesium    Date Value Ref Range Status  08/02/2023 2.0 1.7 - 2.4 mg/dL Final    Comment:    Performed at [facility], [address] [place], [place] [address]   Hgb A1c MFr Bld  Date Value Ref Range Status  10/08/2020 5.5 4.8 - 5.6 % Final    Comment:    (NOTE) Pre diabetes:          5.7%-6.4%  Diabetes:              >6.4%  Glycemic control for   <7.0% adults with diabetes    TSH  Date Value Ref Range Status  10/10/2020 0.615 0.450 - 4.500 uIU/mL Final    Vital Signs:  Temp:  [97.6 F (36.4 C)-99.1 F (37.3 C)] 98.1 F (36.7 C) (06/04 0319) Pulse Rate:  [77-100] 99 (06/04 0319) Cardiac Rhythm: Normal sinus rhythm;Bundle branch block (06/03 1900) Resp:  [18-40] 20 (06/04 0319) BP: (122-158)/(88-117) 122/90 (06/04 0319) SpO2:  [94 %-97 %] 94 % (06/04 0319) Weight:  [73.5 kg (162 lb 0.6 oz)-73.9 kg (163 lb)] 73.5 kg (162 lb 0.6 oz) (06/04 0500)  Intake/Output Summary (Last 24 hours) at 08/02/2023 0758 Last data filed at 08/02/2023 0718 Gross per 24 hour  Intake 240 ml  Output 2200 ml  Net -1960 ml    Current Heart Failure Medications:  Loop diuretic: furosemide  60 mg IV daily Beta-Blocker: carvedilol  12.5 mg BID ACEI/ARB/ARNI: Entresto  97-103 mg BID MRA: none SGLT2i: Farxiga  10 mg daily  Prior  to admission Heart Failure Medications:  Loop diuretic: none Beta-Blocker: carvedilol  3.125 mg BID ACEI/ARB/ARNI: Entresto  97-103 mg BID (reports taking, no fill history) MRA: none SGLT2i: Farxiga  10 mg daily  Assessment: 1. 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 NYHA class III-IV symptoms.  -Symptoms: Patient reports shortness of breath is mildly improved today. Persistent orthopnea, poor appetite, and mild abdominal distention. Denies LEE.  -Volume: Initial weight in ED was reported, not measured. Wt today down 1 lb REDS 08/02/23 is 36%, which is not very elevated, however JVP  appears at the jaw. Creatinine and BUN are trending down today on furosemide  60 mg IV daily. Would consider adding an additional dose of furosemide  this evening.  -Hemodynamics: BP has been persistently elevated. Symptoms at presentation lead to concern for chronic low output heart failure progressing since September of 2024. Last RHC was in 2022. HR 90s after BB dose reduction. Right heart pressures may be useful for further evaluation. Extremities are warm and well perfused. -BB: Carvedilol  recently reduced to 3.125 mg BID in the setting of volume overload and symptoms consistent with chronic low output CHF. -ACEI/ARB/ARNI: Currently on Entresto  97-103 mg BID.  -MRA: Patient would benefit from starting spironolactone. Creatinine slightly down, potassium is 4.2. Can consider adding tomorrow if creatinine continues to trend down. -SGLT2i: Continue Farxiga  10 mg daily. -If BP remains elevated, can consider BiDil.   Plan: 1) Medication changes recommended at this time: -Consider adding an additional furosemide  dose this afternoon for a total of 2 doses today given creatinine improved this morning. Worry this may be more chronic low output than overt volume overload.  2) Patient assistance: -Entresto , Jardiance, and Farxiga  copays are $0 -Bidil copay pending  3) Education: - Patient has been educated on current HF medications and potential additions to HF medication regimen - Patient verbalizes understanding that over the next few months, these medication doses may change and more medications may be added to optimize HF regimen - Patient has been educated on basic disease state pathophysiology and goals of therapy  Medication Assistance / Insurance Benefits Check: Does the patient have prescription insurance?    Type of insurance plan:  Does the patient qualify for medication assistance through manufacturers or grants? Pending   Outpatient Pharmacy: [facility]      Please do not hesitate to reach out with questions or concerns,  Bevely Brush, PharmD, CPP, BCPS Heart Failure Pharmacist  Phone - [phone] 08/02/2023 7:58 AM

## 2023-08-02 NOTE — TOC Initial Note (Addendum)
 Transition of Care [facility]) - Initial/Assessment Note    Patient Details  Name: Jonathon Rios MRN: [id number] Date of Birth: [birth year]  Transition of Care [facility]) CM/SW Contact:    Odilia Bennett, LCSW Phone Number: 08/02/2023, 10:22 AM  Clinical Narrative:  Readmission prevention screen complete. CSW met with patient. No family at bedside. CSW introduced role and explained that discharge planning would be discussed. PCP is Consuelo Tawnya Fava, MD. Patient drives himself to appointments. [facility] [address] [facility]. No issues obtaining medications. Patient will be moving soon and will live alone. No home health or DME use prior to admission. No further concerns. CSW will continue to follow patient for support and facilitate return home once stable. Daughter will likely transport at discharge.         1:01 pm: Per heart failure nurse navigator, patient will be homeless after this hospitalization. CSW added resources to AVS.         Expected Discharge Plan: Home/Self Care Barriers to Discharge: Continued Medical Work up   Patient Goals and CMS Choice            Expected Discharge Plan and Services     Post Acute Care Choice: NA Living arrangements for the past 2 months: Single Family Home                                      Prior Living Arrangements/Services Living arrangements for the past 2 months: Single Family Home Lives with:: Self Patient language and need for interpreter reviewed:: Yes Do you feel safe going back to the place where you live?: Yes      Need for Family Participation in Patient Care: Yes (Comment)     Criminal Activity/Legal Involvement Pertinent to Current Situation/Hospitalization: No - Comment as needed  Activities of Daily Living   ADL Screening (condition at time of admission) Independently performs ADLs?: Yes (appropriate for developmental age) Is the patient deaf or have difficulty hearing?: No Does the patient have difficulty  seeing, even when wearing glasses/contacts?: No Does the patient have difficulty concentrating, remembering, or making decisions?: No  Permission Sought/Granted                  Emotional Assessment Appearance:: Appears stated age Attitude/Demeanor/Rapport: Engaged Affect (typically observed): Calm, Accepting Orientation: : Oriented to Self, Oriented to Place, Oriented to  Time, Oriented to Situation Alcohol / Substance Use: Not Applicable Psych Involvement: No (comment)  Admission diagnosis:  COPD exacerbation (HCC) [J44.1] Acute on chronic HFrEF (heart failure with reduced ejection fraction) (HCC) [I50.23] Acute on chronic congestive heart failure, unspecified heart failure type (HCC) [I50.9] CHF exacerbation (HCC) [I50.9] Patient Active Problem List   Diagnosis Date Noted   CHF exacerbation (HCC) 08/01/2023   Acute on chronic HFrEF (heart failure with reduced ejection fraction) (HCC) 07/31/2023   Thrombocytopenia (HCC) 07/31/2023   Rhinovirus infection 06/06/2022   COPD exacerbation (HCC) 03/18/2022   COPD with acute exacerbation (HCC) 03/17/2022   Hematemesis 03/17/2022   Elevated LFTs 03/17/2022   Demand ischemia (HCC) 03/17/2022   Positive colorectal cancer screening using Cologuard test    Bronchitis 04/30/2021   Urinary hesitancy 04/06/2021   Influenza A 02/09/2021   Adjustment reaction with anxiety and depression 11/25/2020   Acute exacerbation of CHF (congestive heart failure) (HCC) 11/20/2020   Chronic HFrEF (heart failure with reduced ejection fraction) (HCC) 10/14/2020   NICM (nonischemic  cardiomyopathy) (HCC) 10/14/2020   COPD (chronic obstructive pulmonary disease) (HCC) 10/14/2020   Malnutrition of moderate degree 10/09/2020   Acute on chronic combined systolic and diastolic CHF (congestive heart failure) (HCC) 10/09/2020   Seizures (HCC) 10/09/2020   Endotracheally intubated 10/09/2020   Acute respiratory failure with hypoxia (HCC) 10/09/2020   Acute  kidney injury superimposed on CKD (HCC) 10/09/2020   Hypoglycemia 10/09/2020   Cardiac arrest (HCC) 10/08/2020   Unstable angina (HCC)    Atherosclerosis of aorta (HCC) 07/23/2020   HLD (hyperlipidemia) 04/10/2020   Chronic kidney disease, stage 3b (HCC) 04/10/2020   Tinea corporis 05/15/2019   Chronic hepatitis C (HCC) 11/07/2018   History of drug abuse (HCC) 10/17/2017   HTN (hypertension) 03/16/2017   Tobacco use 03/16/2017   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 [place] [address] Phone: [phone] Fax: [phone]     Social Drivers of Health (SDOH) Social History: SDOH Screenings   Food Insecurity: No Food Insecurity (07/31/2023)  Housing: Low Risk  (07/31/2023)  Transportation Needs: No Transportation Needs (07/31/2023)  Utilities: Not At Risk (07/31/2023)  Depression (PHQ2-9): Low Risk  (08/11/2021)  Financial Resource Strain: High Risk (04/13/2017)  Physical Activity: Unknown (07/06/2021)  Social Connections: Socially Isolated (07/31/2023)  Stress: No Stress Concern Present (07/06/2021)  Tobacco Use: Medium Risk (07/31/2023)   SDOH Interventions:     Readmission Risk Interventions    08/02/2023   10:21 AM  Readmission Risk Prevention Plan  Transportation Screening Complete  PCP or Specialist Appt within 3-5 Days Complete  Social Work Consult for Recovery Care Planning/Counseling Complete  Palliative Care Screening Not Applicable  Medication Review Oceanographer) Complete
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
## 2023-08-02 NOTE — Progress Notes (Addendum)
 PROGRESS NOTE   HPI was taken from Dr. Guss Legacy: Jonathon Rios is a 73 y.o. male with medical history significant of HFrEF with last EF of 25-30% (April 2024), V-fib arrest s/p AICD (2022), nonobstructive CAD, polysubstance abuse, COPD, CKD stage IIIb, hypertension, hyperlipidemia, who presents to the ED due to shortness of breath.   Jonathon Rios states that for the last 3 days, he has been experiencing gradually worsening shortness of breath at rest, dyspnea on exertion, productive cough, and orthopnea.  He has not noticed any lower extremity swelling or abdominal distention.  He endorses some nausea due to the cough but otherwise denies any fever, chills, vomiting, diarrhea.  He denies any chest pain.   [name] denies any recent lifestyle changes or diet changes, but notes that he has been grieving the loss of his fiance a couple months ago.  He has been taking his home medications as prescribed with the exception of Lasix  as it makes him urinate very frequently.  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   ED course: On arrival to the ED, patient was hypertensive at 174/118 with heart rate of 88.  He was saturating at 100% on room air with tachypnea at 39/minute.  He was afebrile at 98.4. Initial workup notable for platelets of 83, creatinine 1.49, BUN 25, GFR 50, troponin 38 and BNP of 3086.  D-dimer negative.  Respiratory panel pending.  Chest x-ray with cardiomegaly and vascular congestion.  Patient started on DuoNebs, Lasix , nitroglycerin .  TR[facility] contacted for admission.   Jonathon Rios  ZHY:[id number] DOB: 18-Jan-1951 DOA: 07/31/2023 PCP: Arlen Belton, MD   Assessment & Plan:   Principal Problem:   Acute on chronic HFrEF (heart failure with reduced ejection fraction) (HCC) Active Problems:   HTN (hypertension)   History of drug abuse (HCC)   Chronic kidney disease, stage 3b (HCC)   COPD (chronic  obstructive pulmonary disease) (HCC)   Thrombocytopenia (HCC)   CHF exacerbation (HCC)  Assessment and Plan: Acute on chronic systolic CHF:  last echo showed EF of 25-30% in April 2024, in the setting of nonischemic cardiomyopathy. Here slight worsening 20-25. Pt stopped taking lasix  approx 9 months ago b/c "I was urinating too much." Does not monitor fluid or salt intake. Will increase lasix  from 60 daily to 80 bid, continue home coreg , farxiga , entresto . Patient has been educated regarding necessity of compliance w/ home diuretics.  Thrombocytopenia: chronic, stable. Hiv neg, will f/u hcv and smear  COPD: w  exacerbation. Wheezing today, with cough, not productive. Start steroids, check procal, start duonebs, check rvp/covid   CKDIIIb: Cr is trending up from day prior. Likely secondary to lasix     History of drug abuse: has been sober x 9 years w/o relapses as per pt    HTN: controlled. continue on coreg , entresto ,      DVT prophylaxis: lovenox  Code Status: full  Family Communication: none at bedside Disposition Plan: likely d/c back home   Level of care: Telemetry Cardiac  Status is: inpt    Consultants:    Procedures:   Antimicrobials:   Subjective: Ongoing dyspnea and cough. Not productive  Objective: Vitals:   08/02/23 0500 08/02/23 0813 08/02/23 1108 08/02/23 1507  BP:  (!) 133/96 (!) 128/95 107/87  Pulse:  94 94 88  Resp:      Temp:  97.9 F (36.6 C) (!) 97.5 F (36.4 C)   TempSrc:  SpO2:  93% 94% 95%  Weight: 73.5 kg     Height:        Intake/Output Summary (Last 24 hours) at 08/02/2023 1633 Last data filed at 08/02/2023 1300 Gross per 24 hour  Intake 290 ml  Output 850 ml  Net -560 ml   Filed Weights   08/01/23 0523 08/01/23 1010 08/02/23 0500  Weight: 75.2 kg 73.9 kg 73.5 kg    Examination:  General exam: Appears calm and comfortable.   Respiratory system: course breath sounds b/l, exp wheeze Cardiovascular system: S1 & S2 . Distant  heart sounds Gastrointestinal system: Abdomen is nondistended, soft and nontender.  Normal bowel sounds heard. Central nervous system: Alert and oriented. Moves all extremities  Psychiatry: Judgement and insight appears at baseline. Flat mood and affect     Data Reviewed: I have personally reviewed following labs and imaging studies  CBC: Recent Labs  Lab 07/31/23 1028 08/01/23 0421 08/02/23 0514  WBC 4.3 4.3 5.6  NEUTROABS  --  3.2  --   HGB 14.7 16.0 17.2*  HCT 44.9 47.6 51.4  MCV 92.8 89.5 89.4  PLT 83* 120* 108*   Basic Metabolic Panel: Recent Labs  Lab 07/31/23 1145 08/01/23 0421 08/02/23 0514  NA 138 137 135  K 4.4 4.2 4.2  CL 103 103 99  CO2 26 24 26   GLUCOSE 90 130* 108*  BUN 25* 43* 42*  CREATININE 1.49* 1.65* 1.58*  CALCIUM  8.4* 8.7* 8.7*  MG  --  2.2 2.0   GFR: Estimated Creatinine Clearance: 45.2 mL/min (A) (by C-G formula based on SCr of 1.58 mg/dL (H)). Liver Function Tests: Recent Labs  Lab 07/31/23 1145  AST 21  ALT 11  ALKPHOS 46  BILITOT 1.2  PROT 7.2  ALBUMIN 3.6   No results for input(s): "LIPASE", "AMYLASE" in the last 168 hours. No results for input(s): "AMMONIA" in the last 168 hours. Coagulation Profile: No results for input(s): "INR", "PROTIME" in the last 168 hours. Cardiac Enzymes: No results for input(s): "CKTOTAL", "CKMB", "CKMBINDEX", "TROPONINI" in the last 168 hours. BNP (last 3 results) No results for input(s): "PROBNP" in the last 8760 hours. HbA1C: No results for input(s): "HGBA1C" in the last 72 hours. CBG: No results for input(s): "GLUCAP" in the last 168 hours. Lipid Profile: No results for input(s): "CHOL", "HDL", "LDLCALC", "TRIG", "CHOLHDL", "LDLDIRECT" in the last 72 hours. Thyroid  Function Tests: No results for input(s): "TSH", "T4TOTAL", "FREET4", "T3FREE", "THYROIDAB" in the last 72 hours. Anemia Panel: No results for input(s): "VITAMINB12", "FOLATE", "FERRITIN", "TIBC", "IRON", "RETICCTPCT" in the last  72 hours. Sepsis Labs: No results for input(s): "PROCALCITON", "LATICACIDVEN" in the last 168 hours.  Recent Results (from the past 240 hours)  Resp panel by RT-PCR (RSV, Flu A&B, Covid) Anterior Nasal Swab     Status: None   Collection Time: 07/31/23 11:28 AM   Specimen: Anterior Nasal Swab  Result Value Ref Range Status   SARS Coronavirus 2 by RT PCR NEGATIVE NEGATIVE Final    Comment: (NOTE) SARS-CoV-2 target nucleic acids are NOT DETECTED.  The SARS-CoV-2 RNA is generally detectable in upper respiratory specimens during the acute phase of infection. The lowest concentration of SARS-CoV-2 viral copies this assay can detect is 138 copies/mL. A negative result does not preclude SARS-Cov-2 infection and should not be used as the sole basis for treatment or other patient management decisions. A negative result may occur with  improper specimen collection/handling, submission of specimen other than nasopharyngeal swab, presence of viral  mutation(s) within the areas targeted by this assay, and inadequate number of viral copies(<138 copies/mL). A negative result must be combined with clinical observations, patient history, and epidemiological information. The expected result is Negative.  Fact Sheet for Patients:  BloggerCourse.com  Fact Sheet for Healthcare Providers:  SeriousBroker.it  This test is no t yet approved or cleared by the [place]  FDA and  has been authorized for detection and/or diagnosis of SARS-CoV-2 by FDA under an Emergency Use Authorization (EUA). This EUA will remain  in effect (meaning this test can be used) for the duration of the COVID-19 declaration under Section 564(b)(1) of the Act, 21 U.S.C.section 360bbb-3(b)(1), unless the authorization is terminated  or revoked sooner.       Influenza A by PCR NEGATIVE NEGATIVE Final   Influenza B by PCR NEGATIVE NEGATIVE Final    Comment: (NOTE) The Xpert  Xpress SARS-CoV-2/FLU/RSV plus assay is intended as an aid in the diagnosis of influenza from Nasopharyngeal swab specimens and should not be used as a sole basis for treatment. Nasal washings and aspirates are unacceptable for Xpert Xpress SARS-CoV-2/FLU/RSV testing.  Fact Sheet for Patients: BloggerCourse.com  Fact Sheet for Healthcare Providers: SeriousBroker.it  This test is not yet approved or cleared by the [place]  FDA and has been authorized for detection and/or diagnosis of SARS-CoV-2 by FDA under an Emergency Use Authorization (EUA). This EUA will remain in effect (meaning this test can be used) for the duration of the COVID-19 declaration under Section 564(b)(1) of the Act, 21 U.S.C. section 360bbb-3(b)(1), unless the authorization is terminated or revoked.     Resp Syncytial Virus by PCR NEGATIVE NEGATIVE Final    Comment: (NOTE) Fact Sheet for Patients: BloggerCourse.com  Fact Sheet for Healthcare Providers: SeriousBroker.it  This test is not yet approved or cleared by the [place]  FDA and has been authorized for detection and/or diagnosis of SARS-CoV-2 by FDA under an Emergency Use Authorization (EUA). This EUA will remain in effect (meaning this test can be used) for the duration of the COVID-19 declaration under Section 564(b)(1) of the Act, 21 U.S.C. section 360bbb-3(b)(1), unless the authorization is terminated or revoked.  Performed at [facility], [address] [place], [place] [address]          Radiology Studies: ECHOCARDIOGRAM COMPLETE Result Date: 08/02/2023    ECHOCARDIOGRAM REPORT   Patient Name:   KROY SPRUNG Date of Exam: 08/01/2023 Medical Rec #:  [id number]       Height:       75.0 in Accession #:    [id number]      Weight:       163.0 lb Date of Birth:  [birth year]       BSA:          2.012 m Patient Age:    72 years         BP:           158/117 mmHg Patient Gender: M               HR:           97 bpm. Exam Location:  [facility] Procedure: 2D Echo, Cardiac Doppler and Color Doppler (Both Spectral and Color            Flow Doppler were utilized during procedure). Indications:     I50.21 Acute Systolic CHF  History:         Patient has prior history of Echocardiogram examinations, most  recent 06/05/2022. CHF, CAD, Pacemaker; COPD. Nonischemic                  cardiomyopathy.  Sonographer:     Brigid Canada RDCS Referring Phys:  [id number] Avi Body Diagnosing Phys: Lida Reeks Alluri IMPRESSIONS  1. Left ventricular ejection fraction, by estimation, is 20 to 25%. The left ventricle has severely decreased function. The left ventricle demonstrates global hypokinesis. The left ventricular internal cavity size was mildly dilated. Left ventricular diastolic parameters are consistent with Grade I diastolic dysfunction (impaired relaxation).  2. Right ventricular systolic function is normal. The right ventricular size is normal.  3. The mitral valve is normal in structure. Mild mitral valve regurgitation.  4. The aortic valve is tricuspid. There is mild thickening of the aortic valve. Aortic valve regurgitation is trivial.  5. The inferior vena cava is dilated in size with >50% respiratory variability, suggesting right atrial pressure of 8 mmHg. FINDINGS  Left Ventricle: Left ventricular ejection fraction, by estimation, is 20 to 25%. The left ventricle has severely decreased function. The left ventricle demonstrates global hypokinesis. The left ventricular internal cavity size was mildly dilated. There is no left ventricular hypertrophy. Left ventricular diastolic parameters are consistent with Grade I diastolic dysfunction (impaired relaxation). Right Ventricle: The right ventricular size is normal. No increase in right ventricular wall thickness. Right ventricular systolic function is normal. Left Atrium: Left atrial size  was normal in size. Right Atrium: Right atrial size was normal in size. Pericardium: There is no evidence of pericardial effusion. Mitral Valve: The mitral valve is normal in structure. Mild mitral valve regurgitation. Tricuspid Valve: The tricuspid valve is normal in structure. Tricuspid valve regurgitation is trivial. Aortic Valve: The aortic valve is tricuspid. There is mild thickening of the aortic valve. Aortic valve regurgitation is trivial. Pulmonic Valve: The pulmonic valve was not well visualized. Pulmonic valve regurgitation is not visualized. Aorta: The aortic root is normal in size and structure. Venous: The inferior vena cava is dilated in size with greater than 50% respiratory variability, suggesting right atrial pressure of 8 mmHg. IAS/Shunts: The atrial septum is grossly normal.  LEFT VENTRICLE PLAX 2D LVIDd:         5.60 cm   Diastology LVIDs:         5.00 cm   LV e' medial:    5.66 cm/s LV PW:         1.10 cm   LV E/e' medial:  7.5 LV IVS:        1.00 cm   LV e' lateral:   5.33 cm/s LVOT diam:     2.40 cm   LV E/e' lateral: 7.9 LV SV:         49 LV SV Index:   24 LVOT Area:     4.52 cm  RIGHT VENTRICLE             IVC RV Basal diam:  3.90 cm     IVC diam: 1.70 cm RV S prime:     14.47 cm/s TAPSE (M-mode): 3.2 cm LEFT ATRIUM             Index        RIGHT ATRIUM           Index LA diam:        3.70 cm 1.84 cm/m   RA Area:     13.90 cm LA Vol (A2C):   40.7 ml 20.23 ml/m  RA Volume:   37.00 ml  18.39 ml/m LA Vol (A4C):   41.9 ml 20.83 ml/m LA Biplane Vol: 42.6 ml 21.18 ml/m  AORTIC VALVE LVOT Vmax:   79.80 cm/s LVOT Vmean:  50.333 cm/s LVOT VTI:    0.108 m  AORTA Ao Root diam: 4.00 cm MITRAL VALVE MV Area (PHT): 3.77 cm    SHUNTS MV Decel Time: 201 msec    Systemic VTI:  0.11 m MV E velocity: 42.15 cm/s  Systemic Diam: 2.40 cm MV A velocity: 84.65 cm/s MV E/A ratio:  0.50 Lida Reeks Alluri Electronically signed by Joetta Mustache Signature Date/Time: 08/02/2023/8:26:10 AM    Final          Scheduled Meds:  aspirin  EC  81 mg Oral Daily   budesonide -glycopyrrolate -formoterol   2 puff Inhalation BID   carvedilol   3.125 mg Oral BID   dapagliflozin  propanediol  10 mg Oral QAC breakfast   enoxaparin  (LOVENOX ) injection  40 mg Subcutaneous Q24H   furosemide   60 mg Intravenous Daily   guaiFENesin  600 mg Oral BID   nicotine   21 mg Transdermal Daily   rosuvastatin   10 mg Oral Daily   sacubitril -valsartan   1 tablet Oral BID   sodium chloride  flush  3 mL Intravenous Q12H   tamsulosin   0.4 mg Oral Daily   Continuous Infusions:     LOS: 1 day      Raymonde Calico, MD [facility]  If 7PM-7AM, please contact night-coverage [email or web address] 08/02/2023, 4:33 PM

## 2023-08-02 NOTE — Telephone Encounter (Signed)
 Patient Product/process development scientist completed.    The patient is insured through [facility]. Patient has Medicare and is not eligible for a copay card, but may be able to apply for patient assistance or Medicare RX Payment Plan (Patient Must reach out to their plan, if eligible for payment plan), if available.    Ran test claim for isosorbide-hydralazine (Bidil) 20-37.5 mg and the current 30 day co-pay is $0.00.   This test claim was processed through [facility]- copay amounts may vary at other pharmacies due to pharmacy/plan contracts, or as the patient moves through the different stages of their insurance plan.     Roland Earl, CPHT Pharmacy Technician III Certified Patient Advocate [facility] Patient Advocate Team Direct Number: [phone]  Fax: [phone]

## 2023-08-03 ENCOUNTER — Other Ambulatory Visit: Payer: Self-pay

## 2023-08-03 DIAGNOSIS — J441 Chronic obstructive pulmonary disease with (acute) exacerbation: Secondary | ICD-10-CM

## 2023-08-03 DIAGNOSIS — J211 Acute bronchiolitis due to human metapneumovirus: Secondary | ICD-10-CM | POA: Insufficient documentation

## 2023-08-03 LAB — BASIC METABOLIC PANEL WITH GFR
Anion gap: 9 (ref 5–15)
BUN: 52 mg/dL — ABNORMAL HIGH (ref 8–23)
CO2: 29 mmol/L (ref 22–32)
Calcium: 8.9 mg/dL (ref 8.9–10.3)
Chloride: 97 mmol/L — ABNORMAL LOW (ref 98–111)
Creatinine, Ser: 1.84 mg/dL — ABNORMAL HIGH (ref 0.61–1.24)
GFR, Estimated: 39 mL/min — ABNORMAL LOW (ref >60)
Glucose, Bld: 152 mg/dL — ABNORMAL HIGH (ref 70–99)
Potassium: 4.4 mmol/L (ref 3.5–5.1)
Sodium: 135 mmol/L (ref 135–145)

## 2023-08-03 MED ORDER — FUROSEMIDE 40 MG PO TABS
40.0000 mg | ORAL_TABLET | Freq: Every day | ORAL | 1 refills | Status: AC
  Filled 2023-08-03: qty 60, 60d supply, fill #0

## 2023-08-03 MED ORDER — BUDESONIDE-FORMOTEROL FUMARATE 160-4.5 MCG/ACT IN AERO
2.0000 | INHALATION_SPRAY | Freq: Two times a day (BID) | RESPIRATORY_TRACT | 12 refills | Status: AC
  Filled 2023-08-03: qty 10.2, 30d supply, fill #0

## 2023-08-03 MED ORDER — PREDNISONE 20 MG PO TABS
40.0000 mg | ORAL_TABLET | Freq: Every day | ORAL | 0 refills | Status: AC
  Filled 2023-08-03: qty 10, 5d supply, fill #0

## 2023-08-03 MED ORDER — CARVEDILOL 3.125 MG PO TABS: 3.1250 mg | ORAL_TABLET | Freq: Two times a day (BID) | ORAL | Status: AC

## 2023-08-03 MED ORDER — ALBUTEROL SULFATE HFA 108 (90 BASE) MCG/ACT IN AERS
2.0000 | INHALATION_SPRAY | Freq: Four times a day (QID) | RESPIRATORY_TRACT | 2 refills | Status: AC | PRN
  Filled 2023-08-03: qty 6.7, 30d supply, fill #0

## 2023-08-03 NOTE — Discharge Summary (Addendum)
 Jonathon Rios WGN:[id number] DOB: [birth year] DOA: 07/31/2023  PCP: Arlen Belton, MD  Admit date: 07/31/2023 Discharge date: 08/03/2023  Time spent: 35 minutes  Recommendations for Outpatient Follow-up:  Chf clinic f/u next week as scheduled Cardiologist and pcp f/u    Discharge Diagnoses:  Principal Problem:   COPD with acute exacerbation (HCC) Active Problems:   Acute on chronic HFrEF (heart failure with reduced ejection fraction) (HCC)   HTN (hypertension)   History of drug abuse (HCC)   Chronic kidney disease, stage 3b (HCC)   COPD (chronic obstructive pulmonary disease) (HCC)   Thrombocytopenia (HCC)   CHF exacerbation (HCC)   Acute bronchiolitis due to human metapneumovirus   Discharge Condition: improved  Diet recommendation: heart healthy  Filed Weights   08/01/23 1010 08/02/23 0500 08/03/23 0447  Weight: 73.9 kg 73.5 kg 72.4 kg    History of present illness:  Jonathon Rios is a 73 y.o. male with medical history significant of HFrEF with last EF of 25-30% (April 2024), V-fib arrest s/p AICD (2022), nonobstructive CAD, polysubstance abuse, COPD, CKD stage IIIb, hypertension, hyperlipidemia, who presents to the ED due to shortness of breath.   Jonathon Rios states that for the last 3 days, he has been experiencing gradually worsening shortness of breath at rest, dyspnea on exertion, productive cough, and orthopnea.  He has not noticed any lower extremity swelling or abdominal distention.  He endorses some nausea due to the cough but otherwise denies any fever, chills, vomiting, diarrhea.  He denies any chest pain.   Jonathon Rios denies any recent lifestyle changes or diet changes, but notes that he has been grieving the loss of his fiance a couple months ago.  He has been taking his home medications as prescribed with the exception of Lasix  as it makes him urinate very frequently.  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  Hospital Course:  Patient presents with cough and dyspnea. Initially thought to be 2/2 chf exacerbation as patient not compliant with home lasix  but symptoms did not improve with diuresis. New provider found the patient to be wheezing and so started prednisone  for copd exacerbation. Viral testing positive for metapneumovirus. It thus appears that the bulk of patient's symptoms are secondary to copd exacerbation from a viral infection. Will discharge to complete a short course of oral steroids. Did have an O2 requirement initially but this is resolved and he is breathing comfortably on room air. He does have f/u in chf clinic scheduled for next week and I have resumed his home lasix  (which he hasn't been taking for some time), though at a lower dose of 40 daily (previously prescribed 80). Will need check of bmp at that f/u in one week. Also advised close cardiology and pcp f/u.   Procedures: none   Consultations: none  Discharge Exam: Vitals:   08/03/23 0859 08/03/23 0904  BP: (!) 124/99   Pulse: 86   Resp:    Temp:    SpO2: 95% 95%    General: NAD Cardiovascular: RRR, distant heart sounds Respiratory: few scattered rhonchi, wheezing resolved. Ext: no edema  Discharge Instructions   Discharge Instructions     Diet - low sodium heart healthy   Complete by: As directed    Increase activity slowly   Complete by: As directed       Allergies as of 08/03/2023       Reactions   Penicillins  Hives   Has patient had a PCN reaction causing immediate rash, facial/tongue/throat swelling, SOB or lightheadedness with hypotension: Yes Has patient had a PCN reaction causing severe rash involving mucus membranes or skin necrosis: No Has patient had a PCN reaction that required hospitalization: No Has patient had a PCN reaction occurring within the last 10 years: No If all of the above answers are "NO", then may proceed with Cephalosporin use. Has patient had a PCN  reaction causing immediate rash, facial/tongue/throat swelling, SOB or lightheadedness with hypotension: Yes Has patient had a PCN reaction causing severe rash involving mucus membranes or skin necrosis: No Has patient had a PCN reaction that required hospitalization: No Has patient had a PCN reaction occurring within the last 10 years: No If all of the above answers are "NO", then may proceed with Cephalosporin use. Has patient had a PCN reaction causing immediate rash, facial/tongue/throat swelling, SOB or lightheadedness with hypotension: Yes Has patient had a PCN reaction causing severe rash involving mucus membranes or skin necrosis: No Has patient had a PCN reaction that required hospitalization: No Has patient had a PCN reaction occurring within the last 10 years: No If all of the above answers are "NO", then may proceed with Cephalosporin use.        Medication List     STOP taking these medications    azithromycin  250 MG tablet Commonly known as: ZITHROMAX    zolpidem  5 MG tablet Commonly known as: AMBIEN        TAKE these medications    acetaminophen  325 MG tablet Commonly known as: TYLENOL  Take 2 tablets (650 mg total) by mouth every 6 (six) hours as needed for fever.   albuterol  108 (90 Base) MCG/ACT inhaler Commonly known as: VENTOLIN  HFA Inhale 2 puffs into the lungs every 6 (six) hours as needed for wheezing or shortness of breath.   aspirin  EC 81 MG tablet Take 1 tablet (81 mg total) by mouth daily.   benzonatate  100 MG capsule Commonly known as: TESSALON  Take 1 capsule (100 mg total) by mouth 3 (three) times daily as needed for cough.   budesonide -formoterol  160-4.5 MCG/ACT inhaler Commonly known as: Symbicort  Inhale 2 puffs into the lungs in the morning and at bedtime.   carvedilol  3.125 MG tablet Commonly known as: COREG  Take 1 tablet (3.125 mg total) by mouth 2 (two) times daily. PLEASE CALL OFFICE TO SCHEDULE APPOINTMENT PRIOR TO NEXT REFILL What  changed:  medication strength how much to take   clotrimazole  1 % cream Commonly known as: LOTRIMIN  APPLY TO AFFECTED AREA TWICE A DAY   Entresto  97-103 MG Generic drug: sacubitril -valsartan  TAKE 1 TABLET BY MOUTH TWICE A DAY   Farxiga  10 MG Tabs tablet Generic drug: dapagliflozin  propanediol TAKE 1 TABLET BY MOUTH EVERY DAY BEFORE BREAKFAST   feeding supplement Liqd Take 237 mLs by mouth 3 (three) times daily between meals.   furosemide  40 MG tablet Commonly known as: LASIX  Take 1 tablet (40 mg total) by mouth daily. What changed: how much to take   hydrocortisone  2.5 % cream Apply topically to face qhs t thur and Saturday for rash   ipratropium-albuterol  0.5-2.5 (3) MG/3ML Soln Commonly known as: DUONEB Inhale 3 mLs into the lungs every 6 (six) hours as needed.   ketoconazole  2 % cream Commonly known as: NIZORAL  Apply topically to face qhs on m w Friday for rash   nicotine  21 mg/24hr patch Commonly known as: NICODERM CQ  - dosed in mg/24 hours Place 1 patch (21  mg total) onto the skin daily.   predniSONE  20 MG tablet Commonly known as: DELTASONE  Take 2 tablets (40 mg total) by mouth daily with breakfast for 5 days. Start taking on: August 04, 2023 What changed: how much to take   rosuvastatin  10 MG tablet Commonly known as: CRESTOR  Take 10 mg by mouth daily.   tamsulosin  0.4 MG Caps capsule Commonly known as: FLOMAX  TAKE 1 CAPSULE BY MOUTH EVERY DAY   Trelegy Ellipta  100-62.5-25 MCG/ACT Aepb Generic drug: Fluticasone -Umeclidin-Vilant Inhale 1 puff into the lungs daily.   triamcinolone  0.025 % cream Commonly known as: KENALOG  Apply 1 application. topically 2 (two) times daily. Do not use longer than 7 days due to skin discoloration       Allergies  Allergen Reactions   Penicillins Hives    Has patient had a PCN reaction causing immediate rash, facial/tongue/throat swelling, SOB or lightheadedness with hypotension: Yes Has patient had a PCN reaction  causing severe rash involving mucus membranes or skin necrosis: No Has patient had a PCN reaction that required hospitalization: No Has patient had a PCN reaction occurring within the last 10 years: No If all of the above answers are "NO", then may proceed with Cephalosporin use. Has patient had a PCN reaction causing immediate rash, facial/tongue/throat swelling, SOB or lightheadedness with hypotension: Yes Has patient had a PCN reaction causing severe rash involving mucus membranes or skin necrosis: No Has patient had a PCN reaction that required hospitalization: No Has patient had a PCN reaction occurring within the last 10 years: No If all of the above answers are "NO", then may proceed with Cephalosporin use. Has patient had a PCN reaction causing immediate rash, facial/tongue/throat swelling, SOB or lightheadedness with hypotension: Yes Has patient had a PCN reaction causing severe rash involving mucus membranes or skin necrosis: No Has patient had a PCN reaction that required hospitalization: No Has patient had a PCN reaction occurring within the last 10 years: No If all of the above answers are "NO", then may proceed with Cephalosporin use.    Follow-up Information     [place], Caralyn, PA-C. Go in 1 week(s).   Specialty: Cardiology Why: Appointment scheduled for 08/09/2023 at 11:45 AM for hospital/heart failure follow-up Contact information: [address] [place] [address] [phone]         Barton Like, PA-C Follow up.   Contact information: [address] [facility] [address] [place] [address] [phone]         Arlen Belton, MD Follow up.   Specialty: Family Medicine Contact information: [address] [address] [phone]                  The results of significant diagnostics from this hospitalization (including imaging, microbiology, ancillary and laboratory) are listed below for reference.    Significant  Diagnostic Studies: ECHOCARDIOGRAM COMPLETE Result Date: 08/02/2023    ECHOCARDIOGRAM REPORT   Patient Name:   Youlanda Henry Date of Exam: 08/01/2023 Medical Rec #:  [id number]       Height:       75.0 in Accession #:    [id number]      Weight:       163.0 lb Date of Birth:  29-Jan-1951       BSA:          2.012 m Patient Age:    72 years        BP:  158/117 mmHg Patient Gender: M               HR:           97 bpm. Exam Location:  [facility] Procedure: 2D Echo, Cardiac Doppler and Color Doppler (Both Spectral and Color            Flow Doppler were utilized during procedure). Indications:     I50.21 Acute Systolic CHF  History:         Patient has prior history of Echocardiogram examinations, most                  recent 06/05/2022. CHF, CAD, Pacemaker; COPD. Nonischemic                  cardiomyopathy.  Sonographer:     Brigid Canada RDCS Referring Phys:  [id number] Avi Body Diagnosing Phys: Lida Reeks Alluri IMPRESSIONS  1. Left ventricular ejection fraction, by estimation, is 20 to 25%. The left ventricle has severely decreased function. The left ventricle demonstrates global hypokinesis. The left ventricular internal cavity size was mildly dilated. Left ventricular diastolic parameters are consistent with Grade I diastolic dysfunction (impaired relaxation).  2. Right ventricular systolic function is normal. The right ventricular size is normal.  3. The mitral valve is normal in structure. Mild mitral valve regurgitation.  4. The aortic valve is tricuspid. There is mild thickening of the aortic valve. Aortic valve regurgitation is trivial.  5. The inferior vena cava is dilated in size with >50% respiratory variability, suggesting right atrial pressure of 8 mmHg. FINDINGS  Left Ventricle: Left ventricular ejection fraction, by estimation, is 20 to 25%. The left ventricle has severely decreased function. The left ventricle demonstrates global hypokinesis. The left ventricular internal cavity size was  mildly dilated. There is no left ventricular hypertrophy. Left ventricular diastolic parameters are consistent with Grade I diastolic dysfunction (impaired relaxation). Right Ventricle: The right ventricular size is normal. No increase in right ventricular wall thickness. Right ventricular systolic function is normal. Left Atrium: Left atrial size was normal in size. Right Atrium: Right atrial size was normal in size. Pericardium: There is no evidence of pericardial effusion. Mitral Valve: The mitral valve is normal in structure. Mild mitral valve regurgitation. Tricuspid Valve: The tricuspid valve is normal in structure. Tricuspid valve regurgitation is trivial. Aortic Valve: The aortic valve is tricuspid. There is mild thickening of the aortic valve. Aortic valve regurgitation is trivial. Pulmonic Valve: The pulmonic valve was not well visualized. Pulmonic valve regurgitation is not visualized. Aorta: The aortic root is normal in size and structure. Venous: The inferior vena cava is dilated in size with greater than 50% respiratory variability, suggesting right atrial pressure of 8 mmHg. IAS/Shunts: The atrial septum is grossly normal.  LEFT VENTRICLE PLAX 2D LVIDd:         5.60 cm   Diastology LVIDs:         5.00 cm   LV e' medial:    5.66 cm/s LV PW:         1.10 cm   LV E/e' medial:  7.5 LV IVS:        1.00 cm   LV e' lateral:   5.33 cm/s LVOT diam:     2.40 cm   LV E/e' lateral: 7.9 LV SV:         49 LV SV Index:   24 LVOT Area:     4.52 cm  RIGHT VENTRICLE  IVC RV Basal diam:  3.90 cm     IVC diam: 1.70 cm RV S prime:     14.47 cm/s TAPSE (M-mode): 3.2 cm LEFT ATRIUM             Index        RIGHT ATRIUM           Index LA diam:        3.70 cm 1.84 cm/m   RA Area:     13.90 cm LA Vol (A2C):   40.7 ml 20.23 ml/m  RA Volume:   37.00 ml  18.39 ml/m LA Vol (A4C):   41.9 ml 20.83 ml/m LA Biplane Vol: 42.6 ml 21.18 ml/m  AORTIC VALVE LVOT Vmax:   79.80 cm/s LVOT Vmean:  50.333 cm/s LVOT VTI:     0.108 m  AORTA Ao Root diam: 4.00 cm MITRAL VALVE MV Area (PHT): 3.77 cm    SHUNTS MV Decel Time: 201 msec    Systemic VTI:  0.11 m MV E velocity: 42.15 cm/s  Systemic Diam: 2.40 cm MV A velocity: 84.65 cm/s MV E/A ratio:  0.50 Joetta Mustache Electronically signed by Joetta Mustache Signature Date/Time: 08/02/2023/8:26:10 AM    Final    DG Chest 2 View Result Date: 07/31/2023 CLINICAL DATA:  Shortness of breath, chest pain EXAM: CHEST - 2 VIEW COMPARISON:  06/04/2022 FINDINGS: Mild cardiomegaly. Mediastinal contours within normal limits. Anterior defibrillator again noted, unchanged. Mild vascular congestion and perihilar interstitial prominence which may reflect early interstitial edema. No effusions or acute bony abnormality. IMPRESSION: Cardiomegaly with vascular congestion. Perihilar interstitial prominence may reflect early interstitial edema. Electronically Signed   By: Janeece Mechanic M.D.   On: 07/31/2023 12:03    Microbiology: Recent Results (from the past 240 hours)  Resp panel by RT-PCR (RSV, Flu A&B, Covid) Anterior Nasal Swab     Status: None   Collection Time: 07/31/23 11:28 AM   Specimen: Anterior Nasal Swab  Result Value Ref Range Status   SARS Coronavirus 2 by RT PCR NEGATIVE NEGATIVE Final    Comment: (NOTE) SARS-CoV-2 target nucleic acids are NOT DETECTED.  The SARS-CoV-2 RNA is generally detectable in upper respiratory specimens during the acute phase of infection. The lowest concentration of SARS-CoV-2 viral copies this assay can detect is 138 copies/mL. A negative result does not preclude SARS-Cov-2 infection and should not be used as the sole basis for treatment or other patient management decisions. A negative result may occur with  improper specimen collection/handling, submission of specimen other than nasopharyngeal swab, presence of viral mutation(s) within the areas targeted by this assay, and inadequate number of viral copies(<138 copies/mL). A negative result must be  combined with clinical observations, patient history, and epidemiological information. The expected result is Negative.  Fact Sheet for Patients:  BloggerCourse.com  Fact Sheet for Healthcare Providers:  SeriousBroker.it  This test is no t yet approved or cleared by the [place]  FDA and  has been authorized for detection and/or diagnosis of SARS-CoV-2 by FDA under an Emergency Use Authorization (EUA). This EUA will remain  in effect (meaning this test can be used) for the duration of the COVID-19 declaration under Section 564(b)(1) of the Act, 21 U.S.C.section 360bbb-3(b)(1), unless the authorization is terminated  or revoked sooner.       Influenza A by PCR NEGATIVE NEGATIVE Final   Influenza B by PCR NEGATIVE NEGATIVE Final    Comment: (NOTE) The Xpert Xpress SARS-CoV-2/FLU/RSV plus assay is intended as an aid  in the diagnosis of influenza from Nasopharyngeal swab specimens and should not be used as a sole basis for treatment. Nasal washings and aspirates are unacceptable for Xpert Xpress SARS-CoV-2/FLU/RSV testing.  Fact Sheet for Patients: BloggerCourse.com  Fact Sheet for Healthcare Providers: SeriousBroker.it  This test is not yet approved or cleared by the [place]  FDA and has been authorized for detection and/or diagnosis of SARS-CoV-2 by FDA under an Emergency Use Authorization (EUA). This EUA will remain in effect (meaning this test can be used) for the duration of the COVID-19 declaration under Section 564(b)(1) of the Act, 21 U.S.C. section 360bbb-3(b)(1), unless the authorization is terminated or revoked.     Resp Syncytial Virus by PCR NEGATIVE NEGATIVE Final    Comment: (NOTE) Fact Sheet for Patients: BloggerCourse.com  Fact Sheet for Healthcare Providers: SeriousBroker.it  This test is not yet  approved or cleared by the [place]  FDA and has been authorized for detection and/or diagnosis of SARS-CoV-2 by FDA under an Emergency Use Authorization (EUA). This EUA will remain in effect (meaning this test can be used) for the duration of the COVID-19 declaration under Section 564(b)(1) of the Act, 21 U.S.C. section 360bbb-3(b)(1), unless the authorization is terminated or revoked.  Performed at [facility], [address] [place], [place] [address]   Respiratory (~20 pathogens) panel by PCR     Status: Abnormal   Collection Time: 08/02/23  5:53 PM   Specimen: Nasopharyngeal Swab; Respiratory  Result Value Ref Range Status   Adenovirus NOT DETECTED NOT DETECTED Final   Coronavirus 229E NOT DETECTED NOT DETECTED Final    Comment: (NOTE) The Coronavirus on the Respiratory Panel, DOES NOT test for the novel  Coronavirus (2019 nCoV)    Coronavirus HKU1 NOT DETECTED NOT DETECTED Final   Coronavirus NL63 NOT DETECTED NOT DETECTED Final   Coronavirus OC43 NOT DETECTED NOT DETECTED Final   Metapneumovirus DETECTED (A) NOT DETECTED Final   Rhinovirus / Enterovirus NOT DETECTED NOT DETECTED Final   Influenza A NOT DETECTED NOT DETECTED Final   Influenza B NOT DETECTED NOT DETECTED Final   Parainfluenza Virus 1 NOT DETECTED NOT DETECTED Final   Parainfluenza Virus 2 NOT DETECTED NOT DETECTED Final   Parainfluenza Virus 3 NOT DETECTED NOT DETECTED Final   Parainfluenza Virus 4 NOT DETECTED NOT DETECTED Final   Respiratory Syncytial Virus NOT DETECTED NOT DETECTED Final   Bordetella pertussis NOT DETECTED NOT DETECTED Final   Bordetella Parapertussis NOT DETECTED NOT DETECTED Final   Chlamydophila pneumoniae NOT DETECTED NOT DETECTED Final   Mycoplasma pneumoniae NOT DETECTED NOT DETECTED Final    Comment: Performed at [facility], [address], [place], [place] [address]  SARS Coronavirus 2 by RT PCR (hospital order, performed in [facility] hospital lab)  *cepheid single result test* Anterior Nasal Swab     Status: None   Collection Time: 08/02/23 10:55 PM   Specimen: Anterior Nasal Swab  Result Value Ref Range Status   SARS Coronavirus 2 by RT PCR NEGATIVE NEGATIVE Final    Comment: (NOTE) SARS-CoV-2 target nucleic acids are NOT DETECTED.  The SARS-CoV-2 RNA is generally detectable in upper and lower respiratory specimens during the acute phase of infection. The lowest concentration of SARS-CoV-2 viral copies this assay can detect is 250 copies / mL. A negative result does not preclude SARS-CoV-2 infection and should not be used as the sole basis for treatment or other patient management decisions.  A negative result may occur with improper  specimen collection / handling, submission of specimen other than nasopharyngeal swab, presence of viral mutation(s) within the areas targeted by this assay, and inadequate number of viral copies (<250 copies / mL). A negative result must be combined with clinical observations, patient history, and epidemiological information.  Fact Sheet for Patients:   RoadLapTop.co.za  Fact Sheet for Healthcare Providers: http://kim-miller.com/  This test is not yet approved or  cleared by the [place]  FDA and has been authorized for detection and/or diagnosis of SARS-CoV-2 by FDA under an Emergency Use Authorization (EUA).  This EUA will remain in effect (meaning this test can be used) for the duration of the COVID-19 declaration under Section 564(b)(1) of the Act, 21 U.S.C. section 360bbb-3(b)(1), unless the authorization is terminated or revoked sooner.  Performed at [facility], [address] [place], [place] [address]      Labs: Basic Metabolic Panel: Recent Labs  Lab 07/31/23 1145 08/01/23 0421 08/02/23 0514 08/03/23 0448  NA 138 137 135 135  K 4.4 4.2 4.2 4.4  CL 103 103 99 97*  CO2 26 24 26 29   GLUCOSE 90 130* 108* 152*   BUN 25* 43* 42* 52*  CREATININE 1.49* 1.65* 1.58* 1.84*  CALCIUM  8.4* 8.7* 8.7* 8.9  MG  --  2.2 2.0  --    Liver Function Tests: Recent Labs  Lab 07/31/23 1145  AST 21  ALT 11  ALKPHOS 46  BILITOT 1.2  PROT 7.2  ALBUMIN 3.6   No results for input(s): "LIPASE", "AMYLASE" in the last 168 hours. No results for input(s): "AMMONIA" in the last 168 hours. CBC: Recent Labs  Lab 07/31/23 1028 08/01/23 0421 08/02/23 0514 08/02/23 1707  WBC 4.3 4.3 5.6 4.5  NEUTROABS  --  3.2  --  2.7  HGB 14.7 16.0 17.2* 18.4*  HCT 44.9 47.6 51.4 54.5*  MCV 92.8 89.5 89.4 89.9  PLT 83* 120* 108* 109*   Cardiac Enzymes: No results for input(s): "CKTOTAL", "CKMB", "CKMBINDEX", "TROPONINI" in the last 168 hours. BNP: BNP (last 3 results) Recent Labs    07/31/23 1028  BNP 3,086.9*    ProBNP (last 3 results) No results for input(s): "PROBNP" in the last 8760 hours.  CBG: No results for input(s): "GLUCAP" in the last 168 hours.     Signed:  Raymonde Calico MD.  [facility] 08/03/2023, 11:26 AM

## 2023-08-03 NOTE — Plan of Care (Signed)

## 2023-08-03 NOTE — Care Management Important Message (Signed)
 Important Message  Patient Details  Name: Jonathon Rios MRN: [id number] Date of Birth: 1950-10-17   Important Message Given:  Yes - Medicare IM     Anise Kerns 08/03/2023, 12:36 PM

## 2023-08-03 NOTE — Progress Notes (Addendum)
 Heart Failure Stewardship Pharmacy Note  PCP: Tawnya Fava, Waddell Guess, MD PCP-Cardiologist: Patient appears to see Antionette Kirks, MD Harvie Liner, MD and Percival Brace, MD  HPI: Jonathon Rios is a 73 y.o. 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 On admission, BNP was 3086.9, HS-troponin was 38, and D-dimer was 0.27. Chest x-ray noted cardiomegaly with vascular congestion and possible early interstitial edema. Jonathon Rios reports that the last time he remembers feeling normal was September of 2024. He has had progressive dyspnea on exertion and fatigue since that time, with more rapid worsening in the last week. Respiratory panel positive for metapneumovirus.  Pertinent cardiac history: Echo in 12/2016 with LVEF of 25-30% and G1DD. LVEF unchanged in 04/2019 and 07/2020. LHC in 07/2020 showed no obstructive CAD. RHC at the same time with PCW 14 mmHg, PA 45/18 (27) mmHg, Fick CO 5 L/min, CI 2.36 L/min/m2. VF arrest in 09/2020. Echo with LVEF unchanged. Emblem S-ICD implanted at that time. Echo in 05/2022 showed LVEF of 25-30%, mild LVH, grade II diastolic dysfunction. Echo this admission noted LVEF of 20-25%, G1DD, mild MR.  Pertinent Lab Values: Creatinine, Ser  Date Value Ref Range Status  08/02/2023 1.58 (H) 0.61 - 1.24 mg/dL Final   BUN  Date Value Ref Range Status  08/02/2023 42 (H) 8 - 23 mg/dL Final  40/98/1191 21 8 - 27 mg/dL Final   Potassium  Date Value Ref Range Status  08/02/2023 4.2 3.5 - 5.1 mmol/L Final   Sodium  Date Value Ref Range Status  08/02/2023 135 135 - 145 mmol/L Final  01/11/2021 144 134 - 144 mmol/L Final   B Natriuretic Peptide  Date Value Ref Range Status  07/31/2023 3,086.9 (H) 0.0 - 100.0 pg/mL Final    Comment:    Performed at [facility], [address] [place], [place] [address]   Magnesium   Date Value Ref Range Status  08/02/2023 2.0 1.7 - 2.4 mg/dL Final    Comment:    Performed at [facility], [address] [place], [place] [address]   Hgb A1c MFr Bld  Date Value Ref Range Status  10/08/2020 5.5 4.8 - 5.6 % Final    Comment:    (NOTE) Pre diabetes:          5.7%-6.4%  Diabetes:              >6.4%  Glycemic control for   <7.0% adults with diabetes    TSH  Date Value Ref Range Status  10/10/2020 0.615 0.450 - 4.500 uIU/mL Final    Vital Signs:  Temp:  [97.5 F (36.4 C)-98 F (36.7 C)] 97.7 F (36.5 C) (06/05 0740) Pulse Rate:  [79-99] 79 (06/05 0740) Cardiac Rhythm: Normal sinus rhythm (06/04 2200) Resp:  [14-39] 14 (06/05 0740) BP: (101-133)/(85-96) 114/92 (06/05 0740) SpO2:  [90 %-97 %] 96 % (06/05 0740) Weight:  [72.4 kg (159 lb 9.8 oz)] 72.4 kg (159 lb 9.8 oz) (06/05 0447)  Intake/Output Summary (Last 24 hours) at 08/03/2023 0803 Last data filed at 08/03/2023 0700 Gross per 24 hour  Intake 50 ml  Output 1770 ml  Net -1720 ml    Current Heart Failure Medications:  Loop diuretic: furosemide  80 mg IV daily Beta-Blocker: carvedilol  12.5 mg BID ACEI/ARB/ARNI: Entresto  97-103 mg BID MRA: none SGLT2i: Farxiga  10 mg daily  Prior  to admission Heart Failure Medications:  Loop diuretic: none Beta-Blocker: carvedilol  3.125 mg BID ACEI/ARB/ARNI: Entresto  97-103 mg BID (reports taking, no fill history) MRA: none SGLT2i: Farxiga  10 mg daily  Assessment: 1. 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 NYHA class III-IV symptoms.  -Symptoms: Patient reports shortness of breath is improved improved today. No LEE. Much more alert and interactive today. -Volume: Appears euvolemic. Wt down 10 lbs this admission. REDS 08/02/23 was 36%, and patient has diuresed well since. Creatinine and BUN are up today on furosemide  80 mg IV  BID. Given improvement in symptoms, confounding viral pneumonia, and rising creatinine/BUN with hypochloremia, would consider holding IV furosemide  and considering oral furosemide  when creatinine is improved. -Hemodynamics: BP stable. Symptoms at presentation lead to concern for chronic low output heart failure progressing since September of 2024. Last RHC was in 2022. No concern for this presently as lethargy and appetite have improved. HR down to 70s today.   -BB: Carvedilol  recently reduced to 3.125 mg BID in the setting of volume overload and symptoms consistent with chronic low output CHF. Can consider titration now that patient is euvolemic. -ACEI/ARB/ARNI: Currently on Entresto  97-103 mg BID. Creatinine bump likely due to overdiuresis.  -MRA: Patient would benefit from starting spironolactone. Can consider adding when creatinine improves. -SGLT2i: Continue Farxiga  10 mg daily. -If BP remains elevated, can consider BiDil.   Plan: 1) Medication changes recommended at this time: -Consider holding IV furosemide .  2) Patient assistance: -Entresto , Jardiance, and Farxiga  copays are $0 -Bidil copay pending  3) Education: - Patient has been educated on current HF medications and potential additions to HF medication regimen - Patient verbalizes understanding that over the next few months, these medication doses may change and more medications may be added to optimize HF regimen - Patient has been educated on basic disease state pathophysiology and goals of therapy  Medication Assistance / Insurance Benefits Check: Does the patient have prescription insurance?    Type of insurance plan:  Does the patient qualify for medication assistance through manufacturers or grants? Pending   Outpatient Pharmacy: Prior to admission outpatient pharmacy: [facility]      Please do not hesitate to reach out with questions or concerns,  Bevely Brush, PharmD, CPP, BCPS Heart Failure Pharmacist  Phone -  [phone] 08/03/2023 8:03 AM

## 2023-08-04 LAB — HCV RT-PCR, QUANT (NON-GRAPH): Hepatitis C Quantitation: NOT DETECTED [IU]/mL

## 2023-08-04 LAB — HCV AB W REFLEX TO QUANT PCR: HCV Ab: REACTIVE — AB

## 2023-08-14 ENCOUNTER — Other Ambulatory Visit: Payer: Self-pay | Admitting: Dermatology

## 2023-08-14 DIAGNOSIS — L219 Seborrheic dermatitis, unspecified: Secondary | ICD-10-CM

## 2023-09-30 ENCOUNTER — Other Ambulatory Visit: Payer: Self-pay | Admitting: Family

## 2023-09-30 DIAGNOSIS — J42 Unspecified chronic bronchitis: Secondary | ICD-10-CM

## 2023-10-02 NOTE — Telephone Encounter (Signed)
 Noted. PCP has been updated and changed.

## 2023-10-17 ENCOUNTER — Other Ambulatory Visit: Payer: Self-pay | Admitting: Family Medicine

## 2023-10-17 DIAGNOSIS — B182 Chronic viral hepatitis C: Secondary | ICD-10-CM
